# Patient Record
Sex: Female | Born: 1960 | Race: White | Hispanic: No | Smoking: Current every day smoker
Health system: Southern US, Community
[De-identification: ages and names within clinical notes are randomized; demographics above are authoritative.]

## PROBLEM LIST (undated history)

## (undated) DIAGNOSIS — Z87828 Personal history of other (healed) physical injury and trauma: Secondary | ICD-10-CM

## (undated) DIAGNOSIS — J45909 Unspecified asthma, uncomplicated: Secondary | ICD-10-CM

## (undated) DIAGNOSIS — F319 Bipolar disorder, unspecified: Secondary | ICD-10-CM

## (undated) DIAGNOSIS — F419 Anxiety disorder, unspecified: Secondary | ICD-10-CM

## (undated) DIAGNOSIS — F32A Depression, unspecified: Secondary | ICD-10-CM

## (undated) DIAGNOSIS — K219 Gastro-esophageal reflux disease without esophagitis: Secondary | ICD-10-CM

## (undated) DIAGNOSIS — G8929 Other chronic pain: Secondary | ICD-10-CM

## (undated) DIAGNOSIS — G5601 Carpal tunnel syndrome, right upper limb: Secondary | ICD-10-CM

## (undated) DIAGNOSIS — I10 Essential (primary) hypertension: Secondary | ICD-10-CM

## (undated) DIAGNOSIS — R569 Unspecified convulsions: Secondary | ICD-10-CM

## (undated) DIAGNOSIS — J40 Bronchitis, not specified as acute or chronic: Secondary | ICD-10-CM

## (undated) DIAGNOSIS — I509 Heart failure, unspecified: Secondary | ICD-10-CM

## (undated) DIAGNOSIS — R51 Headache: Secondary | ICD-10-CM

## (undated) DIAGNOSIS — Z972 Presence of dental prosthetic device (complete) (partial): Secondary | ICD-10-CM

## (undated) DIAGNOSIS — Z720 Tobacco use: Secondary | ICD-10-CM

## (undated) DIAGNOSIS — F329 Major depressive disorder, single episode, unspecified: Secondary | ICD-10-CM

## (undated) DIAGNOSIS — K08109 Complete loss of teeth, unspecified cause, unspecified class: Secondary | ICD-10-CM

## (undated) DIAGNOSIS — M549 Dorsalgia, unspecified: Secondary | ICD-10-CM

## (undated) HISTORY — PX: BUNIONECTOMY: SHX129

## (undated) HISTORY — PX: TUBAL LIGATION: SHX77

## (undated) HISTORY — PX: ABDOMINAL HYSTERECTOMY: SHX81

## (undated) HISTORY — PX: CERVICAL FUSION: SHX112

## (undated) HISTORY — PX: INGUINAL HERNIA REPAIR: SUR1180

## (undated) HISTORY — PX: APPENDECTOMY: SHX54

---

## 1998-09-28 ENCOUNTER — Encounter: Payer: Self-pay | Admitting: Family Medicine

## 1998-09-28 ENCOUNTER — Ambulatory Visit (HOSPITAL_COMMUNITY): Admission: RE | Admit: 1998-09-28 | Discharge: 1998-09-28 | Payer: Self-pay | Admitting: Family Medicine

## 2000-04-18 ENCOUNTER — Emergency Department (HOSPITAL_COMMUNITY): Admission: EM | Admit: 2000-04-18 | Discharge: 2000-04-18 | Payer: Self-pay | Admitting: Emergency Medicine

## 2001-01-06 ENCOUNTER — Emergency Department (HOSPITAL_COMMUNITY): Admission: EM | Admit: 2001-01-06 | Discharge: 2001-01-06 | Payer: Self-pay

## 2001-01-31 ENCOUNTER — Encounter: Payer: Self-pay | Admitting: Emergency Medicine

## 2001-01-31 ENCOUNTER — Emergency Department (HOSPITAL_COMMUNITY): Admission: EM | Admit: 2001-01-31 | Discharge: 2001-01-31 | Payer: Self-pay | Admitting: Emergency Medicine

## 2002-03-16 ENCOUNTER — Emergency Department (HOSPITAL_COMMUNITY): Admission: EM | Admit: 2002-03-16 | Discharge: 2002-03-16 | Payer: Self-pay | Admitting: Emergency Medicine

## 2002-03-17 ENCOUNTER — Emergency Department (HOSPITAL_COMMUNITY): Admission: EM | Admit: 2002-03-17 | Discharge: 2002-03-17 | Payer: Self-pay | Admitting: Emergency Medicine

## 2002-03-17 ENCOUNTER — Ambulatory Visit (HOSPITAL_COMMUNITY): Admission: RE | Admit: 2002-03-17 | Discharge: 2002-03-17 | Payer: Self-pay | Admitting: Emergency Medicine

## 2002-09-22 ENCOUNTER — Emergency Department (HOSPITAL_COMMUNITY): Admission: EM | Admit: 2002-09-22 | Discharge: 2002-09-22 | Payer: Self-pay | Admitting: Emergency Medicine

## 2002-11-13 ENCOUNTER — Emergency Department (HOSPITAL_COMMUNITY): Admission: EM | Admit: 2002-11-13 | Discharge: 2002-11-13 | Payer: Self-pay | Admitting: *Deleted

## 2003-01-22 ENCOUNTER — Ambulatory Visit (HOSPITAL_BASED_OUTPATIENT_CLINIC_OR_DEPARTMENT_OTHER): Admission: RE | Admit: 2003-01-22 | Discharge: 2003-01-22 | Payer: Self-pay | Admitting: General Surgery

## 2003-08-09 ENCOUNTER — Emergency Department (HOSPITAL_COMMUNITY): Admission: AD | Admit: 2003-08-09 | Discharge: 2003-08-09 | Payer: Self-pay | Admitting: Family Medicine

## 2003-09-20 ENCOUNTER — Ambulatory Visit (HOSPITAL_COMMUNITY): Admission: RE | Admit: 2003-09-20 | Discharge: 2003-09-20 | Payer: Self-pay

## 2003-11-17 ENCOUNTER — Ambulatory Visit (HOSPITAL_COMMUNITY): Admission: RE | Admit: 2003-11-17 | Discharge: 2003-11-17 | Payer: Self-pay | Admitting: Family Medicine

## 2004-10-18 ENCOUNTER — Ambulatory Visit: Payer: Self-pay | Admitting: Family Medicine

## 2004-10-18 ENCOUNTER — Ambulatory Visit (HOSPITAL_COMMUNITY): Admission: RE | Admit: 2004-10-18 | Discharge: 2004-10-18 | Payer: Self-pay | Admitting: Family Medicine

## 2004-10-30 ENCOUNTER — Ambulatory Visit: Payer: Self-pay | Admitting: Family Medicine

## 2004-11-08 ENCOUNTER — Ambulatory Visit (HOSPITAL_COMMUNITY): Admission: RE | Admit: 2004-11-08 | Discharge: 2004-11-08 | Payer: Self-pay | Admitting: Family Medicine

## 2004-11-15 ENCOUNTER — Ambulatory Visit: Payer: Self-pay | Admitting: Family Medicine

## 2004-11-20 ENCOUNTER — Ambulatory Visit (HOSPITAL_COMMUNITY): Admission: RE | Admit: 2004-11-20 | Discharge: 2004-11-20 | Payer: Self-pay | Admitting: Family Medicine

## 2004-12-25 ENCOUNTER — Ambulatory Visit: Payer: Self-pay | Admitting: Family Medicine

## 2005-01-04 ENCOUNTER — Ambulatory Visit: Payer: Self-pay | Admitting: Family Medicine

## 2005-01-07 ENCOUNTER — Ambulatory Visit (HOSPITAL_COMMUNITY): Admission: RE | Admit: 2005-01-07 | Discharge: 2005-01-07 | Payer: Self-pay | Admitting: Family Medicine

## 2005-02-20 ENCOUNTER — Ambulatory Visit: Payer: Self-pay | Admitting: Family Medicine

## 2005-03-02 ENCOUNTER — Ambulatory Visit (HOSPITAL_COMMUNITY): Admission: RE | Admit: 2005-03-02 | Discharge: 2005-03-02 | Payer: Self-pay | Admitting: Family Medicine

## 2005-03-26 ENCOUNTER — Ambulatory Visit: Payer: Self-pay | Admitting: Family Medicine

## 2005-05-02 ENCOUNTER — Encounter
Admission: RE | Admit: 2005-05-02 | Discharge: 2005-05-02 | Payer: Self-pay | Admitting: Physical Medicine and Rehabilitation

## 2005-06-04 ENCOUNTER — Ambulatory Visit: Payer: Self-pay | Admitting: Family Medicine

## 2005-06-18 ENCOUNTER — Ambulatory Visit: Payer: Self-pay | Admitting: Family Medicine

## 2005-06-20 ENCOUNTER — Ambulatory Visit: Payer: Self-pay | Admitting: Family Medicine

## 2005-07-09 ENCOUNTER — Ambulatory Visit (HOSPITAL_COMMUNITY): Admission: RE | Admit: 2005-07-09 | Discharge: 2005-07-09 | Payer: Self-pay | Admitting: Neurosurgery

## 2005-08-15 ENCOUNTER — Ambulatory Visit: Payer: Self-pay | Admitting: Family Medicine

## 2005-10-05 ENCOUNTER — Ambulatory Visit: Payer: Self-pay | Admitting: Family Medicine

## 2005-11-05 ENCOUNTER — Ambulatory Visit: Payer: Self-pay | Admitting: Internal Medicine

## 2005-11-06 ENCOUNTER — Ambulatory Visit: Payer: Self-pay | Admitting: *Deleted

## 2005-11-15 ENCOUNTER — Ambulatory Visit (HOSPITAL_COMMUNITY): Admission: RE | Admit: 2005-11-15 | Discharge: 2005-11-15 | Payer: Self-pay | Admitting: Family Medicine

## 2005-11-16 ENCOUNTER — Ambulatory Visit: Payer: Self-pay | Admitting: Family Medicine

## 2005-11-27 ENCOUNTER — Encounter: Admission: RE | Admit: 2005-11-27 | Discharge: 2005-11-27 | Payer: Self-pay | Admitting: Family Medicine

## 2006-01-14 ENCOUNTER — Ambulatory Visit: Payer: Self-pay | Admitting: Family Medicine

## 2006-03-04 ENCOUNTER — Ambulatory Visit: Payer: Self-pay | Admitting: Family Medicine

## 2006-04-08 ENCOUNTER — Encounter: Admission: RE | Admit: 2006-04-08 | Discharge: 2006-04-08 | Payer: Self-pay | Admitting: Orthopaedic Surgery

## 2006-04-16 ENCOUNTER — Ambulatory Visit: Payer: Self-pay | Admitting: Family Medicine

## 2006-04-29 ENCOUNTER — Encounter
Admission: RE | Admit: 2006-04-29 | Discharge: 2006-07-28 | Payer: Self-pay | Admitting: Physical Medicine & Rehabilitation

## 2006-04-29 ENCOUNTER — Ambulatory Visit: Payer: Self-pay | Admitting: Physical Medicine & Rehabilitation

## 2006-05-13 ENCOUNTER — Ambulatory Visit: Payer: Self-pay | Admitting: Internal Medicine

## 2006-05-15 ENCOUNTER — Ambulatory Visit: Payer: Self-pay | Admitting: Physical Medicine & Rehabilitation

## 2006-06-05 ENCOUNTER — Encounter
Admission: RE | Admit: 2006-06-05 | Discharge: 2006-09-03 | Payer: Self-pay | Admitting: Physical Medicine & Rehabilitation

## 2006-06-19 ENCOUNTER — Ambulatory Visit: Payer: Self-pay | Admitting: Family Medicine

## 2006-06-27 ENCOUNTER — Ambulatory Visit: Payer: Self-pay | Admitting: Physical Medicine & Rehabilitation

## 2006-07-26 ENCOUNTER — Encounter
Admission: RE | Admit: 2006-07-26 | Discharge: 2006-10-24 | Payer: Self-pay | Admitting: Physical Medicine & Rehabilitation

## 2006-07-30 ENCOUNTER — Ambulatory Visit: Payer: Self-pay | Admitting: Physical Medicine & Rehabilitation

## 2006-08-08 ENCOUNTER — Ambulatory Visit: Payer: Self-pay | Admitting: Physical Medicine & Rehabilitation

## 2006-08-30 ENCOUNTER — Emergency Department (HOSPITAL_COMMUNITY): Admission: EM | Admit: 2006-08-30 | Discharge: 2006-08-30 | Payer: Self-pay | Admitting: Family Medicine

## 2006-09-02 ENCOUNTER — Ambulatory Visit (HOSPITAL_COMMUNITY): Admission: RE | Admit: 2006-09-02 | Discharge: 2006-09-02 | Payer: Self-pay | Admitting: Family Medicine

## 2006-09-02 ENCOUNTER — Emergency Department (HOSPITAL_COMMUNITY): Admission: EM | Admit: 2006-09-02 | Discharge: 2006-09-02 | Payer: Self-pay | Admitting: Family Medicine

## 2006-09-12 ENCOUNTER — Encounter
Admission: RE | Admit: 2006-09-12 | Discharge: 2006-12-11 | Payer: Self-pay | Admitting: Physical Medicine & Rehabilitation

## 2006-09-16 ENCOUNTER — Ambulatory Visit: Payer: Self-pay | Admitting: Physical Medicine & Rehabilitation

## 2006-10-14 ENCOUNTER — Ambulatory Visit: Payer: Self-pay | Admitting: Physical Medicine & Rehabilitation

## 2006-10-28 ENCOUNTER — Ambulatory Visit (HOSPITAL_COMMUNITY): Admission: RE | Admit: 2006-10-28 | Discharge: 2006-10-28 | Payer: Self-pay | Admitting: Obstetrics and Gynecology

## 2006-11-08 ENCOUNTER — Encounter (INDEPENDENT_AMBULATORY_CARE_PROVIDER_SITE_OTHER): Payer: Self-pay | Admitting: Obstetrics and Gynecology

## 2006-11-08 ENCOUNTER — Encounter (HOSPITAL_COMMUNITY): Admission: RE | Admit: 2006-11-08 | Discharge: 2006-12-31 | Payer: Self-pay | Admitting: Obstetrics and Gynecology

## 2006-11-08 ENCOUNTER — Ambulatory Visit (HOSPITAL_BASED_OUTPATIENT_CLINIC_OR_DEPARTMENT_OTHER): Admission: RE | Admit: 2006-11-08 | Discharge: 2006-11-08 | Payer: Self-pay | Admitting: Obstetrics and Gynecology

## 2006-11-08 HISTORY — PX: DIAGNOSTIC LAPAROSCOPY: SUR761

## 2006-11-08 HISTORY — PX: LAPAROSCOPIC APPENDECTOMY: SUR753

## 2006-11-13 DIAGNOSIS — F319 Bipolar disorder, unspecified: Secondary | ICD-10-CM | POA: Insufficient documentation

## 2006-11-13 DIAGNOSIS — F1021 Alcohol dependence, in remission: Secondary | ICD-10-CM

## 2006-11-13 DIAGNOSIS — K219 Gastro-esophageal reflux disease without esophagitis: Secondary | ICD-10-CM

## 2006-11-13 DIAGNOSIS — F3181 Bipolar II disorder: Secondary | ICD-10-CM | POA: Insufficient documentation

## 2006-11-13 DIAGNOSIS — M549 Dorsalgia, unspecified: Secondary | ICD-10-CM | POA: Insufficient documentation

## 2006-11-13 DIAGNOSIS — F17200 Nicotine dependence, unspecified, uncomplicated: Secondary | ICD-10-CM | POA: Insufficient documentation

## 2006-12-09 ENCOUNTER — Ambulatory Visit (HOSPITAL_COMMUNITY): Admission: RE | Admit: 2006-12-09 | Discharge: 2006-12-09 | Payer: Self-pay | Admitting: Family Medicine

## 2006-12-15 ENCOUNTER — Encounter: Admission: RE | Admit: 2006-12-15 | Discharge: 2006-12-15 | Payer: Self-pay | Admitting: Orthopaedic Surgery

## 2007-01-06 ENCOUNTER — Encounter
Admission: RE | Admit: 2007-01-06 | Discharge: 2007-04-06 | Payer: Self-pay | Admitting: Physical Medicine & Rehabilitation

## 2007-01-06 ENCOUNTER — Ambulatory Visit: Payer: Self-pay | Admitting: Physical Medicine & Rehabilitation

## 2007-02-05 ENCOUNTER — Inpatient Hospital Stay (HOSPITAL_COMMUNITY): Admission: RE | Admit: 2007-02-05 | Discharge: 2007-02-07 | Payer: Self-pay | Admitting: Orthopaedic Surgery

## 2007-02-05 HISTORY — PX: LUMBAR LAMINECTOMY: SHX95

## 2007-03-03 ENCOUNTER — Ambulatory Visit: Payer: Self-pay | Admitting: Physical Medicine & Rehabilitation

## 2007-04-29 ENCOUNTER — Encounter
Admission: RE | Admit: 2007-04-29 | Discharge: 2007-05-07 | Payer: Self-pay | Admitting: Physical Medicine & Rehabilitation

## 2007-05-06 ENCOUNTER — Ambulatory Visit: Payer: Self-pay | Admitting: Physical Medicine & Rehabilitation

## 2007-06-09 ENCOUNTER — Encounter
Admission: RE | Admit: 2007-06-09 | Discharge: 2007-09-07 | Payer: Self-pay | Admitting: Physical Medicine & Rehabilitation

## 2007-06-09 ENCOUNTER — Ambulatory Visit: Payer: Self-pay | Admitting: Physical Medicine & Rehabilitation

## 2007-07-02 ENCOUNTER — Ambulatory Visit: Payer: Self-pay | Admitting: Physical Medicine & Rehabilitation

## 2007-08-28 ENCOUNTER — Encounter
Admission: RE | Admit: 2007-08-28 | Discharge: 2007-11-26 | Payer: Self-pay | Admitting: Physical Medicine & Rehabilitation

## 2007-09-01 ENCOUNTER — Ambulatory Visit: Payer: Self-pay | Admitting: Physical Medicine & Rehabilitation

## 2007-09-30 ENCOUNTER — Ambulatory Visit: Payer: Self-pay | Admitting: Physical Medicine & Rehabilitation

## 2007-10-29 ENCOUNTER — Ambulatory Visit: Payer: Self-pay | Admitting: Physical Medicine & Rehabilitation

## 2007-11-26 ENCOUNTER — Encounter
Admission: RE | Admit: 2007-11-26 | Discharge: 2008-01-28 | Payer: Self-pay | Admitting: Physical Medicine & Rehabilitation

## 2007-12-01 ENCOUNTER — Ambulatory Visit: Payer: Self-pay | Admitting: Physical Medicine & Rehabilitation

## 2007-12-12 ENCOUNTER — Ambulatory Visit (HOSPITAL_COMMUNITY): Admission: RE | Admit: 2007-12-12 | Discharge: 2007-12-12 | Payer: Self-pay | Admitting: Family Medicine

## 2007-12-31 ENCOUNTER — Ambulatory Visit: Payer: Self-pay | Admitting: Physical Medicine & Rehabilitation

## 2008-01-28 ENCOUNTER — Ambulatory Visit: Payer: Self-pay | Admitting: Physical Medicine & Rehabilitation

## 2008-02-07 ENCOUNTER — Encounter
Admission: RE | Admit: 2008-02-07 | Discharge: 2008-02-07 | Payer: Self-pay | Admitting: Physical Medicine & Rehabilitation

## 2008-02-10 ENCOUNTER — Encounter: Admission: RE | Admit: 2008-02-10 | Discharge: 2008-02-10 | Payer: Self-pay | Admitting: Orthopaedic Surgery

## 2008-02-27 ENCOUNTER — Encounter
Admission: RE | Admit: 2008-02-27 | Discharge: 2008-04-28 | Payer: Self-pay | Admitting: Physical Medicine & Rehabilitation

## 2008-03-01 ENCOUNTER — Ambulatory Visit: Payer: Self-pay | Admitting: Physical Medicine & Rehabilitation

## 2008-03-01 ENCOUNTER — Encounter: Admission: RE | Admit: 2008-03-01 | Discharge: 2008-05-30 | Payer: Self-pay | Admitting: Anesthesiology

## 2008-03-16 ENCOUNTER — Ambulatory Visit: Payer: Self-pay | Admitting: Anesthesiology

## 2008-03-30 ENCOUNTER — Ambulatory Visit: Payer: Self-pay | Admitting: Anesthesiology

## 2008-04-28 ENCOUNTER — Ambulatory Visit: Payer: Self-pay | Admitting: Physical Medicine & Rehabilitation

## 2008-05-18 ENCOUNTER — Encounter
Admission: RE | Admit: 2008-05-18 | Discharge: 2008-08-16 | Payer: Self-pay | Admitting: Physical Medicine & Rehabilitation

## 2008-05-18 ENCOUNTER — Ambulatory Visit: Payer: Self-pay | Admitting: Physical Medicine & Rehabilitation

## 2008-06-15 ENCOUNTER — Ambulatory Visit: Payer: Self-pay | Admitting: Physical Medicine & Rehabilitation

## 2008-07-27 ENCOUNTER — Ambulatory Visit: Payer: Self-pay | Admitting: Physical Medicine & Rehabilitation

## 2008-08-07 ENCOUNTER — Ambulatory Visit (HOSPITAL_BASED_OUTPATIENT_CLINIC_OR_DEPARTMENT_OTHER): Admission: RE | Admit: 2008-08-07 | Discharge: 2008-08-07 | Payer: Self-pay | Admitting: Otolaryngology

## 2008-08-07 ENCOUNTER — Ambulatory Visit: Payer: Self-pay | Admitting: Internal Medicine

## 2008-08-23 ENCOUNTER — Encounter
Admission: RE | Admit: 2008-08-23 | Discharge: 2008-11-21 | Payer: Self-pay | Admitting: Physical Medicine & Rehabilitation

## 2008-10-01 ENCOUNTER — Ambulatory Visit: Payer: Self-pay | Admitting: Physical Medicine & Rehabilitation

## 2008-11-03 ENCOUNTER — Ambulatory Visit: Payer: Self-pay | Admitting: Physical Medicine & Rehabilitation

## 2008-12-01 ENCOUNTER — Ambulatory Visit: Payer: Self-pay | Admitting: Physical Medicine & Rehabilitation

## 2008-12-01 ENCOUNTER — Encounter
Admission: RE | Admit: 2008-12-01 | Discharge: 2009-03-01 | Payer: Self-pay | Admitting: Physical Medicine & Rehabilitation

## 2008-12-13 ENCOUNTER — Ambulatory Visit (HOSPITAL_COMMUNITY): Admission: RE | Admit: 2008-12-13 | Discharge: 2008-12-13 | Payer: Self-pay | Admitting: Family Medicine

## 2008-12-24 ENCOUNTER — Encounter: Admission: RE | Admit: 2008-12-24 | Discharge: 2008-12-24 | Payer: Self-pay | Admitting: Orthopaedic Surgery

## 2008-12-30 ENCOUNTER — Ambulatory Visit: Payer: Self-pay | Admitting: Physical Medicine & Rehabilitation

## 2009-01-26 ENCOUNTER — Ambulatory Visit: Payer: Self-pay | Admitting: Physical Medicine & Rehabilitation

## 2009-02-23 ENCOUNTER — Ambulatory Visit: Payer: Self-pay | Admitting: Physical Medicine & Rehabilitation

## 2009-03-10 ENCOUNTER — Emergency Department (HOSPITAL_COMMUNITY): Admission: EM | Admit: 2009-03-10 | Discharge: 2009-03-10 | Payer: Self-pay | Admitting: Emergency Medicine

## 2009-03-14 ENCOUNTER — Encounter: Admission: RE | Admit: 2009-03-14 | Discharge: 2009-05-18 | Payer: Self-pay | Admitting: Anesthesiology

## 2009-03-21 ENCOUNTER — Encounter
Admission: RE | Admit: 2009-03-21 | Discharge: 2009-05-18 | Payer: Self-pay | Admitting: Physical Medicine & Rehabilitation

## 2009-03-22 ENCOUNTER — Ambulatory Visit: Payer: Self-pay | Admitting: Physical Medicine & Rehabilitation

## 2009-04-12 ENCOUNTER — Ambulatory Visit: Payer: Self-pay | Admitting: Anesthesiology

## 2009-08-26 ENCOUNTER — Encounter: Admission: RE | Admit: 2009-08-26 | Discharge: 2009-08-26 | Payer: Self-pay | Admitting: Orthopaedic Surgery

## 2010-02-06 ENCOUNTER — Encounter: Admission: RE | Admit: 2010-02-06 | Discharge: 2010-02-06 | Payer: Self-pay | Admitting: Orthopaedic Surgery

## 2010-06-18 ENCOUNTER — Encounter: Payer: Self-pay | Admitting: Orthopaedic Surgery

## 2010-06-19 ENCOUNTER — Encounter: Payer: Self-pay | Admitting: Physical Medicine & Rehabilitation

## 2010-08-31 LAB — ETHANOL

## 2010-08-31 LAB — URINALYSIS, ROUTINE W REFLEX MICROSCOPIC
Glucose, UA: NEGATIVE mg/dL
Ketones, ur: NEGATIVE mg/dL
Nitrite: NEGATIVE
pH: 5.5 (ref 5.0–8.0)

## 2010-08-31 LAB — DIFFERENTIAL
Basophils Absolute: 0.1 10*3/uL (ref 0.0–0.1)
Eosinophils Relative: 2 % (ref 0–5)
Lymphocytes Relative: 35 % (ref 12–46)
Lymphs Abs: 3.6 10*3/uL (ref 0.7–4.0)
Neutro Abs: 5.5 10*3/uL (ref 1.7–7.7)
Neutrophils Relative %: 54 % (ref 43–77)

## 2010-08-31 LAB — RAPID URINE DRUG SCREEN, HOSP PERFORMED
Amphetamines: NOT DETECTED
Benzodiazepines: POSITIVE — AB
Opiates: POSITIVE — AB

## 2010-08-31 LAB — BASIC METABOLIC PANEL
BUN: 18 mg/dL (ref 6–23)
Calcium: 9.5 mg/dL (ref 8.4–10.5)
Chloride: 107 mEq/L (ref 96–112)
Creatinine, Ser: 0.67 mg/dL (ref 0.4–1.2)
GFR calc non Af Amer: >60 mL/min (ref >60)

## 2010-08-31 LAB — CBC
Platelets: 185 10*3/uL (ref 150–400)
RDW: 13.5 % (ref 11.5–15.5)
WBC: 10.2 10*3/uL (ref 4.0–10.5)

## 2010-08-31 LAB — POCT CARDIAC MARKERS
CKMB, poc: <1 ng/mL — ABNORMAL LOW (ref 1.0–8.0)
Myoglobin, poc: 42 ng/mL (ref 12–200)

## 2010-10-10 NOTE — Assessment & Plan Note (Signed)
Darlene Wilcox is set for back surgery tomorrow due to her progressive disk  disease and stenosis.  She is having increasing pain. Vicodin and MS  Contin are no longer covering the pain.  Flector patches are helping  somewhat as well as Lidoderm.  The Flector seems to be most beneficial.  She is on Soma for spasm, although this is not helping either at this  point. The pain is 10/10 today. Pain is in the back and radiating into  the legs.  She is having some right subscapular pain as well as pain to  the neck.  She is set for surgery tomorrow at 1:30 by Dr. Noel Gerold.   REVIEW OF SYSTEMS:  Notable for the above.  The patient also reports  depression, anxiety, night sweats, wheeze, and constipation.  Full  review is in written Health and History section.   SOCIAL HISTORY:  The patient is single.  Her mother will be helping her  at discharge.  She is still smoking.   PHYSICAL EXAMINATION:  VITAL SIGNS:  Blood pressure 117/78, pulse 82,  respiratory rate 18.  She is saturating 99% on room air.  GENERAL:  The patient is pleasant, in a bit of distress and anxious  today.  Otherwise she is appropriate.  HEART:  Regular rate.  CHEST:  Clear.  BACK:  Very rigid to palpation and range of motion.  She has spasm  throughout the thoracic and lumbar spinal musculature.  She is limited  with flexion and extension today.  She is constantly moving as pain is  difficult to control.   ASSESSMENT:  1. Significant lumbar spondylosis and stenosis with radiculitis.  2. Secondary myofascial pain.  3. Anxiety and depression.  4. Bipolar disorder.  5. Carpal tunnel syndrome.   PLAN:  1. I will see the patient in the hospital for postoperative pain      management.  2. New medication as above for now.  3. I will have her back here in about a month for followup at the pain      center.      Darlene Wilcox, M.D.  Electronically Signed     ZTS/MedQ  D:  02/04/2007 11:18:07  T:  02/04/2007 17:22:17  Job  #:  [id number]   cc:   Darlene Wilcox, M.D.  Fax: [phone]

## 2010-10-10 NOTE — Procedures (Signed)
Darlene Wilcox NO.:  [id number]   MEDICAL RECORD NO.:  [id number]          PATIENT TYPE:  REC   LOCATION:  [facility]                          FACILITY:  [facility]   PHYSICIAN:  Celene Kras, MD        DATE OF BIRTH:  22-Feb-1961   DATE OF PROCEDURE:  03/16/2008  DATE OF DISCHARGE:                               OPERATIVE REPORT   Darlene Wilcox comes in for pain management today.  I evaluated her and  reviewed the Health and History form and 14-point review of systems.  She is seen Dr. Noel Gerold and Dr. Riley Kill, recommending nerve root block, C6,  left side.  1. She is having some evolving bilateral spondylitic pain is some      point we may also address the facet as RF could be an option.  2. Diagnostically and therapeutically we reviewed the risk,      complications, and options of transforaminal cervical epidural      intervention.  She understands and wishes to proceed.  Needle was      blunted.  3. Follow her up expectantly.  Modified with features in health      profile such as cigarette cessation is mandatory for best outcome.   Reviewed with her, questions were answered discussed in lay terms.   Objectively, diffuse paracervical myofascial, primary positive cervical  facetal compression test, particularly left greater than right.  Slightly decreased grip strength to the left, pain with extension and  range of motion.  Nothing new neurologically.   IMPRESSION:  Radicular symptoms just above the cervical spine.  I saw  radicular elements present.   PLAN:  Cervical transforaminal epidural intervention, C6, withdrawn  needle technique, left side.  She is consented.  Predicated further  intervention based on need and overall response.  Posterior in 2-3  weeks.   The patient was taken to the fluoroscopy suite and placed supine in  position.  Neck prepped and draped in the usual fashion.  Using a 25  gauge needle I advanced the C6 nerve root and confirmed placement  of  multiple fluoroscopic positions using Isovue 200.  Stabilize.  Then,  test uneventfully and follow with 1.5 mL of lidocaine and 1% MPF.  Tolerated the procedure well.  Assessment in the context of activities  of daily living.  We will see her in followup.  Discharge instructions  given.           ______________________________  Celene Kras, MD     HH/MEDQ  D:  03/16/2008 10:49:39  T:  03/16/2008 23:21:38  Job:  [id number]

## 2010-10-10 NOTE — Assessment & Plan Note (Signed)
Darlene Wilcox is back regarding her multiple pain issues.  Her neck is doing a  bit better after injections.  She had more success with the left C6  injection as opposed to the C7-T1 injection performed by Dr. Stevphen Rochester.  She complains of low back pain, especially this morning upon awakening,  which seems to be near the operative level.  She has minimal radiation  of leg pain.  She has ongoing hand symptoms and she wears braces for  these as it is related to carpal tunnel.  She complains of off and on  burning and tingling in the neck.  Pain interferes with general  activity, in relationship with others, and enjoyment of life on a mild  level.  Mood overall appears improved.  She is moving and exercising  minimally at home at this point.  She has gained some weight overall.   REVIEW OF SYSTEMS:  Notable for numbness, tremor, tingling, confusion,  depression, anxiety, night sweats, poor appetite, although she is  gaining weight somehow.  Full review is in the written health and  history section.   SOCIAL HISTORY:  The patient is single, lives with boyfriend.  She still  smokes.   PHYSICAL EXAMINATION:  VITAL SIGNS:  Blood pressure is 135/88, pulse is  95, respiratory rate 18, and she is sating 98% on room air.  GENERAL:  The patient is much more pleasant and appropriate today.  EXTREMITIES:  She is walking and moving around the room.  She has some  pain with forward flexion, but seems to have better range of motion  overall with about 50-60 degrees of forward flexion, 10-15 degrees of  extension, and rotation 15-20 degrees bilaterally.  Neck is somewhat  tender over the C5-C7 levels, but she seems to have better movement with  rotation and lateral bending today.  Spurling tests are negative to  equivocal.  Strength is 5/5.  She has positive Tinel signs at both  wrists.  Lumbar paraspinals are notable for pain, particularly at the L4-  L5 and L5-S1 areas bilaterally with associated spasms.  HEART:  Regular.  CHEST:  Clear.  ABDOMEN:  Soft and nontender.   ASSESSMENT:  1. Lumbar spondylosis with stenosis and radiculitis status post      laminectomy/fusion.  2. Cervical spondylosis with radiculopathy and positive response to      cervical epidural injections.  3. Anxiety with depression.  4. Bipolar disorder.  5. Carpal tunnel syndrome.   PLAN:  1. After informed consent, we injected 4 trigger points at the L4-L5      and L5-S1 areas bilaterally with 2 mL of 1% lidocaine.  The patient      tolerated it well.  2. I want to get her involved in outpatient physical therapy to      address range of motion stretching, cord muscle strengthening      modalities, and home exercise program.  She needs to be dedicated      to this as well as appropriate diet and weight loss.  3. Maintain MS Contin 60 mg q.12 h. as well as Norco 10/325 for      breakthrough pain, q.6 h. p.r.n., #60 and 120 respectively.  4. Continue imipramine, Cymbalta, and Soma as previous.  5. We will see her back in 1 month with nursing and 2 months with me.      Ranelle Oyster, M.D.  Electronically Signed     ZTS/MedQ  D:  04/28/2008 11:34:22  T:  04/29/2008 00:08:35  Job #:  [id number]   cc:   Sharolyn Douglas, M.D.  Fax: [phone]   Priscille Heidelberg. Pamalee Leyden, MD

## 2010-10-10 NOTE — Assessment & Plan Note (Signed)
Darlene Wilcox is back regarding her back pain. She is doing fairly well after  her back surgery last month. Her pain in the back is down to an average  of about a 5/10. These are worse in the morning and at night time. She  did have a fall the first couple of days after the hospital on her left  knee and the left knee is still painful. She had swelling initially but  it seems to be generally improving. She is applying a little bit of ice  to it but doing little else. She has had home therapy and is walking  with a walker. She wears her brace when she is out of bed. She describes  her pain as sharp, constant, aching. Sleep is fair. The pain interferes  with her quality of life and general activities on a moderate level.   REVIEW OF SYSTEMS:  The patient reports some depression, anxiety,  intermittent confusion, weight gain, night sweats, poor appetite and  abdominal pain at times. Other pertinent positives listed below. Full  review is in the written health and history section in the chart.   SOCIAL HISTORY:  The patient is single but living with boyfriend. She is  smoking still but less than a pack a day.   PHYSICAL EXAMINATION:  VITAL SIGNS:  Blood pressure is 121/79, pulse is  91, respiratory rate 18, she is satting 98% on room air.  GENERAL:  The patient is pleasant, minimal distress today. Affect was  bright.  HEART:  Regular rate.  CHEST:  Clear.  ABDOMEN:  Soft, nontender.  She is wearing her lumbar sacral orthosis today. We did not remove this  for examination today.  Her left knee had some mild peripatellar swelling and tenderness in the  prepatellar area but she had full range of motion. No crepitus was  noted. She was slightly antalgic with weightbearing on the left side.  Strength was generally 5/5 in both legs and in addition at the left  knee. Sensory exam is intact. Reflexes were 1+ to 2+ throughout.   ASSESSMENT:  1. Lumbar spondylosis and stenosis with radiculitis.  2.  Myofascial pain in the lumbar and thoracic spine.  3. Anxiety/depression.  4. Bipolar disorder.  5. Carpal tunnel syndrome.   PLAN:  1. The patient's improving nicely after surgery. Continue bracing and      range of motion per Dr. Wells Guiles instruction.  2. Recommended ice and Neoprene knee sleeve for the left knee. I also      gave her 2 weeks of Celebrex to help reduce any inflammation that      is there. I recommended ice 3-4 times a day to the left knee. This      should improve on its own.  3. Refilled morphine today as well as her Soma and trazodone. Dr.      Noel Gerold gave her hydrocodone. Will see her back in about 3 months      with me and 1 month in the nurse clinic.      Ranelle Oyster, M.D.  Electronically Signed     ZTS/MedQ  D:  03/04/2007 10:52:22  T:  03/04/2007 13:19:00  Job #:  [id number]   cc:   Sharolyn Douglas, M.D.  Fax: [phone]

## 2010-10-10 NOTE — Assessment & Plan Note (Signed)
Darlene Wilcox Wilcox is back regarding her multiple pain complaints.  She had an MRI  of her cervical spine done on December 24, 2008, which revealed a shallow  broad-based right paracentral medial foraminal disk protrusion with mass  effect on the thecal sac.  Mild foraminal encroachment was also seen.  These changes were mildly increased from the last imaging.  The patient  continues with bilateral neck pain around the operative site.  She  received some trigger point injections last month from Dr. Thomasena Edis, PA,  with minimal to poor results.  She tells me Dr. Thomasena Wilcox wants to treat  this conservatively for now.  She remains on hydrocodone, Topamax, and  Opana through our office.  She tries to exercise and work on straight at  the range of motion at home, but limited sometimes to pain.  She rates  her pain at 3-7/10 depending on when she is taking her meds, how much  activity she has had, etc.  Pain interferes with general activity,  relations with others, enjoyment of life from a mild-to-moderate level.  Sleep is fair.   REVIEW OF SYSTEMS:  Notable for multiple items including some spells  when she passed out.  She was worked up for these and it was thought  that she may have had some seizures.  The patient states that she has  history of seizure as a child.  Full review is in the written health and  history section of the chart.   SOCIAL HISTORY:  Unchanged.  The patient is single, living alone.   PHYSICAL EXAMINATION:  VITAL SIGNS:  Blood pressure is 112/67, pulse is  106, respiratory rate 18, sating 99% on room air.  The patient is  pleasant, alert, and oriented x3.  Affect is bright and appropriate.  She is wearing her back brace today.  Strength is preserved in the upper  and lower extremities, 5/5.  She has some mild pain with palpation over  the cervical, paraspinals, trapezius muscles, etc.  I was unable to  localize this today and muscles were generally tender overall.  Cognitively, she is  appropriate.  Her mood looks good today.  HEART:  Regular.  CHEST:  Clear.  ABDOMEN:  Soft, nontender.   ASSESSMENT:  1. Cervical post laminectomy syndrome with spondylosis and myofascial      pain also.  2. Lumbar spondylosis and post laminectomy syndrome.  3. Anxiety with depression.  4. Myofascial pain.   PLAN:  1. Some of her pain maybe related to the C6-C7 disk findings.  It      might be worthwhile trying C6-C7 epidural injection to see if shows      any improvement in her pain.  2. Meanwhile we refilled Opana ER 30 mg every 12 hours #60,      hydrocodone one every 6 hours p.r.n. #120.  3. Increase Topamax to 200 mg nightly.  4. I will see her back, pending injections above with Dr. Stevphen Wilcox.      Darlene Wilcox Wilcox, M.D.  Electronically Signed     ZTS/MedQ  D:  01/26/2009 13:57:35  T:  01/27/2009 04:26:54  Job #:  [id number]   cc:   Darlene Wilcox Wilcox, M.D.  Fax: [phone]   Darlene Wilcox Wilcox. Darlene Leyden, MD

## 2010-10-10 NOTE — Assessment & Plan Note (Signed)
Darlene Wilcox is back regarding her back pain.  She seems to have taken a step  back since I last saw her in April.  She comes in with a lot more back  and neck pain.  She rates it as 8-9/10.  She sounds as if she has been  more reclusive, not getting out much.  She is unable to tolerate much  driving, because she is not driving herself anywhere anymore for the  most part.  She denies any specific leg pain currently.  She does state  that the pain is sharp, burning, stabbing, and aching in the low back,  as well as in the periscapular regions.   MEDICATIONS:  1. MS Contin 15 mg q.12 h.  2. Norco 10 for breakthrough pain.  3. Trazodone 100 mg 2 tablets at bedtime.  4. Imipramine 150 mg at bedtime.  5. Celebrex 200 mg daily.  6. Soma 350 mg q.8 h. p.r.n. pain.  7. Lidoderm patches as well daily.   REVIEW OF SYSTEMS:  Notable for numbness, confusion, depression, spasm,  and dizziness.  She reports no vomiting, diarrhea, bleeding, weight  gain, and night sweats.  Other pertinent positives are above and full  review is in written health and history section.   SOCIAL HISTORY:  The patient is single, living with her boyfriend.  She  is still smoking half a pack of cigarettes per day.   PHYSICAL EXAMINATION:  Blood pressure is 129/70, pulse is 86,  respiratory rate 24, and she is sating 98% on room air.  The patient is  pleasant, alert, and oriented x3.  Affect is bright and appropriate.  She did get a bit tearful at times when talking about her problems.  Posture was poor, particularly in the thoracic and cervical spine with  the head forward posture assumed.  She was tender to palpation over the  lower lumbar spine and PSIS areas.  She had pain with flexion,  extension, rotation, and lateral bending in each side.  She was point  tender to left rhomboid and left trapezius areas essentially at the T4  and C5 levels.  Spinous processes were tender to touch.  Neurologically,  she had no focal or  sensory loss. Strength is symmetrical near 5/5.  Reflexes were 1+ to 2+ throughout.   ASSESSMENT:  1. Lumbar spondylosis with stenosis and radiculitis.  2. Myofascial, cervical, and thoracic pain.  3. Anxiety/depression.  4. Bipolar disorder.  5. Carpal tunnel syndrome.   PLAN:  1. After full consent, we injected 2 trigger points along the left      rhomboid and left trapezius near the spinous process.  The patient      tolerated it well.  2. We will increase MS Contin to 30 mg q.12 h.  3. Increase Cymbalta to 120 mg daily.  4. Continue Norco for breakthrough pain.  5. Recommend ongoing range of motion stretching and exercising.  6. Follow up with Dr. Noel Wilcox for surgical opinion in the next month or      so.  7. I will see her back in about 2 months with nurse clinic followup in      1 month.      Darlene Wilcox, M.D.  Electronically Signed     ZTS/MedQ  D:  12/01/2007 13:37:12  T:  12/02/2007 10:27:25  Job #:  [id number]   cc:   Darlene Wilcox, M.D.  Fax: [phone]   Darlene Wilcox. Darlene Leyden, MD

## 2010-10-10 NOTE — Assessment & Plan Note (Signed)
Darlene Wilcox is back regarding her cervical and low back pain.  She had her  cervical decompression in March.  She feels that the pain has decreased  a bit, although still has some tightness particularly in her neck  restricting range of motion.  She describes the pain currently as sharp,  burning, tingling, and aching.  She does complain of numbness in her  feet.  Low back pain is generally stable to improved.  Sleep is poor at  times.   REVIEW OF SYSTEMS:  Notable for numbness, tingling, depression,  confusion, anxiety, weakness, occasional night sweats, problems with  constipation and appetite.  Other pertinent positives are above and full  review is in the written health and history section of the chart.   SOCIAL HISTORY:  Unchanged.  She lives with boyfriend still.   PHYSICAL EXAMINATION:  Blood pressure is 120/69, pulse is 94,  respiratory rate 18, she is sating 98% on room air.  The patient is  pleasant, alert, and oriented x3.  She is much calmer today.  She has  her bone stimulator on in her neck.  We palpated the neck and there are  multiple myofascial trigger points today including the right and left  trapezius, right splenius capitis, and right levator scapulae muscles.  Strength seemed to be generally preserved in both upper extremities.  Reflexes are 2+.  Did not push range of motion today.  Lower extremity  strength is 5/5 with fair range of motion limited somewhat in flexion  and extension today.  Heart is regular.  Chest is clear.  Abdomen is  soft, nontender.  She appears to have lost a bit of weight since I last  saw her.   ASSESSMENT:  1. Lumbar spondylosis with stenosis and radiculitis status post      laminectomy and fusion.  2. Cervical spondylosis status post laminectomy and fusion.  3. Anxiety with depression.  4. Myofascial pain.   PLAN:  1. Continue Topamax 100 mg nightly.  2. Refilled Opana ER 30 mg 1 q.12 h. #60 and hydrocodone 1 q.6 h.      p.r.n. #20.  3. After informed consent, injected 4 muscles (right and left traps,      splenius capitis on the right and right levator scapulae) each with      2 mL of 1% lidocaine.  The patient tolerated well without any ill      effects.  Encouraged range of motion per Dr. Wells Guiles direction.  4. I would like to see the patient work on better exercise and      conditioning as a whole.  She agrees with this and we will make a      better effort there.  5. We will see her back in about 3 months' time with 37-month followup      in nursing clinic.  Overall, she appears a bit improved today.      Ranelle Oyster, M.D.  Electronically Signed     ZTS/MedQ  D:  11/03/2008 13:56:35  T:  11/04/2008 04:25:51  Job #:  [id number]   cc:   Sharolyn Douglas, M.D.  Fax: [phone]   Priscille Heidelberg. Pamalee Leyden, MD

## 2010-10-10 NOTE — Op Note (Signed)
Darlene Wilcox, ADSIT NO.:  [id number]   MEDICAL RECORD NO.:  [id number]          PATIENT TYPE:  AMB   LOCATION:  [facility]                         FACILITY:  [facility]   PHYSICIAN:  Malachi Pro. Ambrose Mantle, M.D. DATE OF BIRTH:  01/21/1961   DATE OF PROCEDURE:  11/08/2006  DATE OF DISCHARGE:                               OPERATIVE REPORT   PREOPERATIVE DIAGNOSES:  1. Chronic right lower quadrant pain, negative CT scan.  2. Normal pelvic exam.   POSTOPERATIVE DIAGNOSES:  1. Chronic right lower quadrant pain, negative CT scan.  2. Normal pelvic exam.  3. Slight adhesions of the left ovary to the sigmoid colon.  4. Normal right tube and ovary.  5. Adhesions surrounding the appendix to the right pelvic wall.   OPERATION:  Diagnostic laparoscopy.   CONSULTATION:  Velora Heckler, MD, who performed an appendectomy and  lysis of adhesions.   ANESTHESIA:  General anesthesia.   DESCRIPTION OF PROCEDURE:  The patient was brought to the operating room  and placed under satisfactory general anesthesia and placed in Allen  stirrups.  The abdomen was prepped with Betadine solution.  The urethra  was prepped and a 14 Foley catheter was inserted to straight drain; the  patient's urethra would not admit a 16 catheter.  A ring forceps with a  sponge was placed into the vagina.  The abdomen was then draped as a  sterile field.  A semilunar incision was made in the inferior portion of  the umbilicus and carried down to the fascia.  The fascia was elevated,  incised and the peritoneum was identified and entered.  A pursestring  suture of 0 Vicryl was placed around the fascial opening.  An Hasson  cannula was placed into the abdominal cavity and then the suture was  attached to the Hasson cannula.  Visualization was good.  I used high  flow to establish pneumoperitoneum and then made an incision into the  lower abdomen suprapubically and inserted a 5-mm port under direct  vision.  Inspection  of the upper abdomen revealed the liver to be  smooth.  I did not see any abnormalities.  Inspection of the pelvis  revealed the cul-de-sac posteriorly to be normal; the anterior cul-de-  sac was normal.  The right tube and ovary were normal.  The right ovary  was slightly adherent to the right pelvic wall, as would be expected  after surgery.  The left tube and ovary also appeared normal; however,  the left ovary was adherent slightly to the sigmoid colon.  Exploring  the right lower quadrant, the appendix was adherent to the right pelvic  wall; it did not appear to be inflamed.  The cecum did not seem to be  adherent, except for what appeared to be normal attachments.  I called  Dr. Derrell Lolling; he was not available.  Dr. Gerrit Friends came to see if the  adhesions around the appendix should be taken down.  He scrubbed in and  took the adhesions around the appendix down and then did an appendectomy  that he will dictate himself.  He established hemostasis, liberally  irrigated the pelvis with Nezhat catheter and hemostasis was complete.  He then left the operating room.  I inspected both accessory ports;  there was no bleeding.  I released the pneumoperitoneum, tied the  pursestring suture through the fascia down; it gave a good closure of  the fascia.  I then closed the subcu tissue  with 3-0 Vicryl and the skin was reapproximated with 3-0 plain catgut.  I placed 1/8-inch Steri-Strips across the excess report skin incisions,  removed the Hulka cannula and the Foley will be discontinued.  The  patient was then returned to Recovery in satisfactory condition.      Malachi Pro. Ambrose Mantle, M.D.  Electronically Signed     TFH/MEDQ  D:  11/08/2006  T:  11/09/2006  Job:  [id number]

## 2010-10-10 NOTE — Op Note (Signed)
NAMEHILDAGARD, SOBECKI NO.:  [id number]   MEDICAL RECORD NO.:  [id number]          PATIENT TYPE:  INP   LOCATION:  5009                         FACILITY:  [facility]   PHYSICIAN:  Sharolyn Douglas, M.D.        DATE OF BIRTH:  04-Aug-1960   DATE OF PROCEDURE:  02/05/2007  DATE OF DISCHARGE:                               OPERATIVE REPORT   DIAGNOSIS:  1. L4-5 lumbar degenerative disk disease with severe back and lower      extremity pain.  2. L4-5 disk herniation with left-sided stenosis.   PROCEDURE:  1. L4-5 laminectomy with decompression of the thecal sac.  2. L4-5 transforaminal lumbar interbody fusion with placement of 8-mm      PEEK cage.  3. L4-5 pedicle screw instrumentation using Abbott Spine system.  4. Posterior spinal fusion L4-5.  5. Local autogenous bone graft supplemented with OP1 BMP.   SURGEON:  Sharolyn Douglas, MD.   ASSISTANT:  Aura Fey. Bobbe Medico.   ANESTHESIA:  General endotracheal.   ESTIMATED BLOOD LOSS:  200 mL.   COMPLICATIONS:  None.   NEEDLE AND SPONGE COUNT:  Correct.   INDICATIONS:  The patient is a pleasant 45-year female with chronic  progressive back and bilateral lower extremity pain left greater than  right.  Her imaging studies show a severe degenerative disk at L4-5.  There is relative preservation of the remaining disk.  There is a broad-  based disk herniation more so on the left side which appears to be  impinging on the L5 nerve root.  She has failed all attempts at other  conservative treatment modalities, now elects to undergo L4-5  decompression and fusion in hopes of improving her symptoms.  Risk,  benefits, alternatives were reviewed, the patient elected to proceed.   PROCEDURE:  After informed consent she was taken to the operating room.  Underwent general endotracheal anesthesia without difficulty, given  prophylactic IV antibiotics.  Neuro monitoring was established in form  of lower extremity EMGs and SSEPs.  She was  turned prone onto the [place]  frame.  All bony prominences padded.  Face, eyes protected at all times.  Back prepped, draped usual sterile fashion.  Midline incision made over  the L4-5 level.  The exposure was carried out to the tips of the  transverse processes of L4 and L5 bilaterally.  Intraoperative x-ray was  taken confirming the levels.  We then placed pedicle screws at L4-L5  bilaterally using anatomic probing technique.  Each pedicle point was  started using the awl.  The pedicles were probed and then palpated with  ball feeler.  There were no breeches.  We placed 6.5 x 45 mm screws in  L4, 6.5 x 35 mm screw on the left and L5 and 6.5 x 40 mm screw on the  right at L5.  The bone quality was excellent.  The screw purchase was  good.  We stimulated each screw using triggered EMGs there were no  deleterious changes.   At this point we turned our attention to completing a laminectomy by  removing the inferior 2/3 of the L4 spinous process and lamina.  Using  loupes and headlight magnification, the ligamentum flavum was removed  piecemeal.  We then performed a sublaminar decompression.  The lateral  recesses were decompressed and the L5 nerve roots were freed up.   We then turned our attention to performing transforaminal lumbar  interbody fusion on the left side at L4-5.  The remaining L4-5 facet  joint was osteotomized.  Free running EMGs were monitored.  The exiting  transversing nerve roots were identified and protected all times.  A  transforaminal window was created.  Epidural bleeders were coagulated  with bipolar.  The disk space was entered and a radical diskectomy was  completed.  The disk material was degenerative.  Cartilaginous endplates  were scraped using the curved curettes.  Disk space was dilated up to 8  mm.  We then packed the disk space with local bone graft obtained from  the laminectomy.  We inserted an 8 mm PEEK cage into the interspace  tamped anteriorly and  then flipped it longitudinally.  There were no  changes in the free running EMGs throughout either procedure.   At this point we completed the posterior spinal fusion by decorticating  transverse processes of L4 and L5 bilaterally, packed the remaining  local bone graft into the gutters over L4 and L5.  We then placed short  segment titanium rods into the polyaxial screw heads.  Compression was  applied before shearing off the locking caps.  Hemostasis was achieved.  Intraoperative x-ray was taken which showed good positioning of  instrumentation at L4-5.  Deep Hemovac drain left, deep fascia closed  with a running #1 Vicryl suture.  Subcutaneous layer closed with 0  Vicryl and 2-0 Vicryl followed by a running 3-0 subcu Vicryl suture on  the skin edges.  Dermabond was applied.  Sterile dressing was placed.  Patient turned supine, extubated without difficulty and transferred to  recovery in stable condition.   It should be noted my assistant Orlin Hilding, PA was present throughout  procedure.  She assisted me with positioning.  She then assisted with  the exposure using the Cobb elevators and suction.  She then assisted  with the decompression using loupes and headlight magnification.  She  assisted with the instrumentation and fusion and then with wound  closure.      Sharolyn Douglas, M.D.  Electronically Signed     MC/MEDQ  D:  02/05/2007  T:  02/06/2007  Job:  [id number]

## 2010-10-10 NOTE — Procedures (Signed)
Darlene Wilcox, GRAZIOSI NO.:  [id number]   MEDICAL RECORD NO.:  [id number]         PATIENT TYPE:  OUT   LOCATION:  SLEEP CENTER                 FACILITY:  [facility]   PHYSICIAN:  Clinton D. Maple Hudson, MD, FCCP, FACPDATE OF BIRTH:  September 04, 1960   DATE OF STUDY:  08/07/2008                            NOCTURNAL POLYSOMNOGRAM   REFERRING PHYSICIAN:  Hermelinda Medicus, M.D.   INDICATION FOR STUDY:  Hypersomnia with sleep apnea.   EPWORTH SLEEPINESS SCORE:  Epworth sleepiness score 10/24, BMI 32.5.  Weight 172 pounds, height 61 inches.  Neck 14 inches.   MEDICATIONS:  Home medications are charted and reviewed.   SLEEP ARCHITECTURE:  Total sleep time of 366 minutes with sleep  efficiency 99.3%.  Stage I was 1%, stage II 99%, stages III and REM were  absent.  Sleep latency 0.5 minutes, awake after sleep onset 2 minutes,  arousal index 2.5.  Bedtime medication included carisoprodol 350 mg,  morphine 60 mg, Klonopin 1 mg, topiramate, imipramine 50 mg,  oxcarbazepine 600 mg, trazodone 200 mg.  Sleep architecture was  remarkable for sustained sleep without normal waking intervals,  predominantly stage II.  This sleep pattern reflects her significant  bedtime medication.   RESPIRATORY DATA:  Apnea-hypopnea index (AHI) 0.7 per hour.  Total of  four events was scored, all as hypopneas.  She slept only supine and all  events were recorded in that position.   OXYGEN DATA:  Moderate snoring with oxygen desaturation to a nadir of  81%.  A total of 44.2 minutes was recorded with oxygen saturation less  than 88%.  By protocol, the technician added supplemental nasal prong  oxygen at 1 L per minute at 23:11 hours to maintain oxygen saturation  closer to 90% through the rest of the study.   CARDIAC DATA:  Sinus rhythm 92-100 beats per minute.   MOVEMENT-PARASOMNIA:  No significant movement disturbance.  No bathroom  visits.  The patient slept supine with very little adventitious   movement.   IMPRESSIONS-RECOMMENDATIONS:  1. This patient has a history of bipolar disorder and sleep      architecture reflects significant bedtime medication as listed      above.  The technician commented that upon arrival the patient had      delayed reactions and responses and needed assistance with      paperwork.  The patient also described an irregular sleep pattern      at home sometimes staying up all night and then sleeping all day      suggesting poor sleep hygiene.  2. Insignificant sleep disordered breathing, within normal limits.      Apnea-hypopnea index 0.7 per hour (normal range 2-5 per hour).  All      sleep was supine and events were hypopneas.  Moderate snoring with      oxygen desaturation to a nadir of 81%.  3. The patient is a smoker.  She recorded 44.2 minutes with oxygen      saturation on room air less than 88%      indicating that she would qualify for supplemental oxygen during      sleep  at home.  The technician added 1 L per minute nasal prong      oxygen to sustain oxygen saturation close to 90% through the study.      Clinton D. Maple Hudson, MD, [facility], FACP  Diplomate, Biomedical engineer of Sleep Medicine  Electronically Signed     CDY/MEDQ  D:  08/08/2008 09:40:57  T:  08/09/2008 02:00:56  Job:  [id number]

## 2010-10-10 NOTE — Procedures (Signed)
[name], HOESCHEN NO.:  [id number]   MEDICAL RECORD NO.:  [id number]         PATIENT TYPE:  APRM   LOCATION:                                 FACILITY:   PHYSICIAN:  Celene Kras, MD        DATE OF BIRTH:  [birth year]   DATE OF PROCEDURE:  DATE OF DISCHARGE:                               OPERATIVE REPORT   [facility] with good  results from her previous transforaminal intervention.  Essentially less  arm pain, more of bilateral neck and arm pain, but the distribution, and  relief cycling obtained from the transforaminal intervention is still  evident.  I think diagnostically and therapeutically she is very  pleased.   Modifiable features in health profile such as cigarette cessation is  strongly urged for best outcome.   I will go ahead and I will block her by translaminar approach as this is  in the right and left side, then follow her expectantly.  The risk or  benefit leads [place] this way, and I have discussed that with her.   Objectively, diffuse paracervical myofascial discomfort, positive  cervical facetal compression test.  The radicular pain is improved after  the transforaminal approach, less C6 distribution, spondylitic pain is  evident today.  This is more so arm, shoulder pain, bilateral, nothing  new neurologically with good grip strength and adequate range of motion.   IMPRESSION:  Degenerative spine disease of the cervical spine,  spondylitic pain.  Radicular elements of C6, improved.   PLAN:  Cervical epidural and follow up expectantly.  Risks,  complications, and options are fully outlined.  There were no other  rationale performed, intervention minimize escalation controlled  substances, she did not bring her medications in, and we counseled.  She  is consented for today's procedure.   The patient was taken to fluoroscopy suite and placed in the prone  position.  The back prepped and draped in  the usual fashion.  The neck  was prepped and draped in the usual fashion using a Hustead needle.  I  advanced under direct fluoroscopic observation in the C7-T1 interspace  without any evidence of CSF, heme, or paresthesia.  Test block  uneventfully followed by 40 mg Aristocort and flushed the needle.   She tolerated the procedure well.  No complications from our procedure.  Appropriate recovery.  Discharge instructions given.           ______________________________  Celene Kras, MD     HH/MEDQ  D:  03/30/2008 14:43:20  T:  03/31/2008 03:24:31  Job:  [id number]

## 2010-10-10 NOTE — Assessment & Plan Note (Signed)
Darlene Wilcox is back regarding her back pain.  She is still having some pain  in her low back area.  In general, it is doing much better since  surgery, although she complains of pain into her legs.  Her shoulders  and neck have been a source of pain as well.  Her left knee has been  stable, although uncomfortable still.  She has had a few falls at home,  which really have been in some extenuating circumstances such as  tripping in a hole and falling while she was raking leaves outside.  Essentially, they were falls after doing things she should not have been  doing.  She is not having issues as far as her basic movement and  walking around the house and even surfaces.  She rates her pain a 4/10,  but it may spike to an 8/10.  She describes it as sharp, sometimes  tingling and aching.  Her leg pain is usually worse at night.  The pain  interferes with her general activity, relations with others, enjoyment  of life on a mild to moderate level.   REVIEW OF SYSTEMS:  positive  for the above as well as confusion,  depression, anxiety, some tremor, tingling, weight gain.  She reports  occasional wheezing as well.  Full review has been written under the  history section of the chart.   SOCIAL HISTORY:  Without significant change.  She is living with her  boyfriend and working on smoking cessation.   PHYSICAL EXAMINATION:  VITAL SIGNS:  Blood pressure 112/47, pulse 98,  respiratory rate 18.  She is saturating 97% on room air.  GENERAL:  The patient is pleasant, alert and oriented x3.  She stands  with head forward, shoulder curved posture.  She has pain throughout her  cervical and shoulder musculature.  She has most prominent pain in the  right middle trapezius muscles.  The back is still tender with palpation  over the lumbar paraspinal's in the perioperative site.  Strength is  generally preserved at 4+ to 5/5.  Sensory exam is intact.  The reflexes  are 1+ and 2+ throughout.  HEART:  Regular  rate.  CHEST:  Clear.  ABDOMEN:  Soft, nontender.   ASSESSMENT:  1. Lumbar spondylosis with stenosis and radiculitis.  2. Myofascial lumbar and cervical pain.  3. Anxiety/depression.  4. Bipolar disorder.  5. Carpal tunnel syndrome.   PLAN:  1. Continue surgical followup per Dr. Noel Gerold.  2. Will send patient for outpatient PT to set her up with a cervical      traction unit for the neck.  3. After informed consent, we injected the bilateral trapezius      muscles, each with two mL 1% lidocaine.  The patient tolerated it      well.  The patient needs to work on sitting and standing posture.  4. Refill morphine as well as Norco today, baseline pain control.  5. I wanted her to go back on the Tofranil at nighttime 75 mg to help      with her leg pain.  6. Continue trazodone for sleep.  7. I will see her back in 3 months with nurse clinic followup in 1      month's time.      Ranelle Oyster, M.D.  Electronically Signed     ZTS/MedQ  D:  06/10/2007 12:05:07  T:  06/10/2007 12:45:29  Job #:  [id number]   cc:   Sharolyn Douglas, M.D.  Fax: [phone]   Rayne Du  [facility]

## 2010-10-10 NOTE — Assessment & Plan Note (Signed)
Darlene Wilcox is back regarding her cervical and lumbar spine pain.  We are  setting her up for a left C6 nerve root block with Dr. Stevphen Rochester tomorrow.  She had some relief with the adjustment of her MS Contin.  She is seeing  Dr. Noel Gerold, who concurs with the plan for her neck.  She also had an  lumbar spine MRI performed, which showed no dramatic changes, although  she has ongoing arthritis and degenerative findings there.  She rates  her pain as 6-8/10, described it as aching and burning.  The pain  involves the back and radiates down both arms, left more than right.  Pain interferes with general activity, in relationship with others, and  enjoyment of life on a moderate-to-severe level.   REVIEW OF SYSTEMS:  The patient reports numbness, spasms, confusion,  depressions, anxiety, fever, weight gain, night sweats, poor appetite,  and sleep apnea.  Full review is in the written health and history  section of the chart.   SOCIAL HISTORY:  The patient is single and lives with her boyfriend.  She is still smoking.   PHYSICAL EXAMINATION:  VITAL SIGNS:  Blood pressure is 136/76, pulse is  86, respiratory rate 18, and she is sating 97% on room air.  GENERAL:  The patient's mood was much better today.  She has still been  anxious.  NEUROLOGIC:  Strength is generally 5/5 in both upper extremities with 2+  reflexes throughout.  No focal sensory findings today.  She did seem to  favor the left side a bit more and had pain at the C5-C6 interspace.  The pain is worse with flexion today than extension.  She had equivocal  Spurling test bilaterally.  Leg strength is 5/5 and normal sensory exam  today.  The patient has ongoing tenderness to palpation of the  myofascial lumbar spine as well as the cervical and shoulder girdle  muscles.  HEART:  Regular.  CHEST:  Clear.  ABDOMEN:  Soft and nontender.  Weight was about the same.   ASSESSMENT:  1. Lumbar spondylosis with stenosis and radiculitis.  2.  Cervical spondylosis with documented C5-C6 disk protrusion and      displacement of the C6 nerve root on the left.  3. Anxiety with depression.  4. Bipolar disorder.  5. Carpal tunnel syndrome.   PLAN:  1. The patient to see Dr. Stevphen Rochester tomorrow for the left C6 nerve root      block.  2. Continue MS Contin 60 q.12 h. with Norco for breakthrough pain.  3. Maintain imipramine and Cymbalta for mood and neuropathic pain      symptoms.  4. Follow up with Dr. Noel Gerold per his office.  5. Consider therapy course once injections are complete and if pain      decreases.  The patient needs desperately to improve her activity      levels and tolerance.      Ranelle Oyster, M.D.  Electronically Signed     ZTS/MedQ  D:  03/01/2008 13:08:45  T:  03/02/2008 00:26:33  Job #:  [id number]   cc:   Sharolyn Douglas, M.D.  Fax: [phone]   Priscille Heidelberg. Pamalee Leyden, MD

## 2010-10-10 NOTE — Assessment & Plan Note (Signed)
Darlene Wilcox is back regarding her back pain.  She has had a progression of  her neck symptoms over the last few days where she has felt like  cracking, popping, and sudden increase in pain in her neck.  She has had  persistent pain in her neck for several weeks with pain and radiating  into the arms.  She has not seen Dr. Noel Gerold back in followup.  Her low  back continues to bother her as well.  She states that she has pain from  her neck down to her low back essentially.  She rates her pain at 10/10  today.  On average it has been 6-7/10.  Her mood has been poor.  She  states that she does not want to go out of the house.  We increased her  Cymbalta back in July to 120 mg daily and she does not notice any  specific change with that.  She remains on MS Contin 30 mg q.12 h.  She  uses Norco for breakthrough pain.  Trazodone 100 mg 2 tablets at night.  She denied Tofranil XL due to insurance issues.  She remains on  Celebrex.   REVIEW OF SYSTEMS:  Notable for the above.  She continues to have some  confusion, numbness, depression, anxiety, and dizziness.  She has had  some migraine headaches as well with vomiting and diarrhea.   SOCIAL HISTORY:  The patient lives alone.  She is still smoking.   PHYSICAL EXAMINATION:  VITAL SIGNS:  Blood pressure 121/57, pulse is 69,  respiratory rate 18, and she is sating 98% on room air.  GENERAL:  Her affect is very tearful and she is anxious today.  It is  hard to redirect her at times due to her mood.  MUSCULOSKELETAL:  Posture is poor.  She sits with the head in forward  position.  Neck was painful with movements in all planes today.  She had  palpation tenderness over the suboccipital musculature and upper  trapezius regions.  She had 2+ reflexes in both upper limbs today.  Strength is 5/5 except for pain inhibition.  Sensory exam is nonfocal.  She has had diffuse myofascial tenderness along the periscapular and  trapezius regions.  HEART:  Regular.  CHEST:  Clear.  ABDOMEN:  Soft and nontender.  She appears to have gained some weight.   ASSESSMENT:  1. Lumbar spondylosis with stenosis, radiculitis.  2. Cervical myofascial pain with acute increase in pain.  The patient      complains of intermittent radicular symptoms.  3. Anxiety/depression.  4. Bipolar disorder.  5. Carpal tunnel syndrome.   PLAN:  1. We will set the patient up for MRI of the cervical spine to assess      her disks and look for entrapment.  2. We injected the patient with 60 mg of IM Toradol to decrease pain      acutely today.  3. Increase MS Contin to 60 mg q.12 h. with Norco for breakthrough      pain q.6 h. p.r.n.  4. Maintain Cymbalta 120 mg daily.  We may look at another      antidepressant.  5. Resume imipramine, but try short-acting form 50 mg 1 or 2 nightly.  6. Consider psychiatric referral or at least psychological referral.  7. I will review her case with Dr. Noel Gerold.      Ranelle Oyster, M.D.  Electronically Signed     ZTS/MedQ  D:  01/28/2008 12:59:55  T:  01/29/2008 47:82:95  Job #:  [id number]   cc:   Sharolyn Douglas, M.D.  Fax: [phone]   Priscille Heidelberg. Pamalee Leyden, MD

## 2010-10-10 NOTE — Assessment & Plan Note (Signed)
Darlene Wilcox is back regarding her cervical and low back pain.  The trigger  points we did in her low back were very beneficial at the beginning of  the month.  Unfortunately, she was raking and moving some hay in her  yard and aggravated her low back again.  The pain now is 6/10, sometimes  will die down to 2/10 depending on activity level.  Her Oswestry pain  score is 58% today.  She sees Dr. Noel Gerold regarding her neck, which is an  ongoing problem.  She had some benefits with the injections particularly  the left C6 injection.  The pain is worse in the morning and night  hours.  It seems to worsen also with standing, bending, and prolonged  sitting.   REVIEW OF SYSTEMS:  Without specific change.  Mood has been a bit better  overall.  She has occasional anxiety, problems with appetite.  Other  pertinent positives are above.   SOCIAL HISTORY:  Without change.  She lives with her boyfriend.  Still  smoking.   PHYSICAL EXAMINATION:  VITAL SIGNS:  Blood pressure 117/60, pulse is 86,  respiratory rate 18, she is sating 95% on room air.  GENERAL:  The patient is pleasant, alert, and oriented x3.  Affect is  bright.  She is happy today for the most part.  MUSCULOSKELETAL:  She had pain in the neck with flexion and extension  today.  Spurling test was equivocal on either side.  Strength was 5/5 in  both upper extremities with intact sensation and 1+ reflexes throughout.  She has a hard time relaxing for those.  Low back pain was noted with  palpation in the L4-L5, L5-S1 levels along the facets of spinal  processes and paraspinal muscles.  Pain seemed to be worse when she  flexed forward.  She is only able to bend approximately 50 degrees today  before pain set in.  Extension caused some discomfort as well as  rotation and lateral bending on either side.  Strength and sensation was  intact in both legs today.  Weight appeared to be stable.  Cognitively,  she is intact.  HEART:  Regular.  CHEST:   Clear.  ABDOMEN:  Soft and nontender.   ASSESSMENT:  1. Lumbar spondylosis with stenosis and radiculitis.  2. Status post laminectomy and fusion.  3. Cervical spondylosis with disk protrusions at C5-C6 and C6-C7.  She      responded partially to cervical blocks.  4. Anxiety with depression.  5. Bipolar disorder.  6. Carpal tunnel syndrome.  7. Myofascial pain of the neck and low back.   PLAN:  1. I think it would still be worth trying another cervical epidural      injection particularly at the left C6 location.  I will await Dr.      Wells Guiles followup today regarding surgery.  It also may be      worthwhile to try a traction at home.  2. After informed consent, we injected 4 trigger points on the lower      lumbar spine, 3 on the right, 1 on the left, utilizing 2 mL of 1%      lidocaine.  The patient tolerated it well.  Post-injection      instructions were given.  3. Continue morphine, controlled release 60 mg q.12 h. with Norco 10      for breakthrough pain.  I wrote for 60 and      20 of these respectively.  4.  I will see her back in approximately 1 month's time.      Ranelle Oyster, M.D.  Electronically Signed     ZTS/MedQ  D:  05/18/2008 13:30:57  T:  05/19/2008 05:04:57  Job #:  [id number]   cc:   Sharolyn Douglas, M.D.  Fax: [phone]

## 2010-10-10 NOTE — Assessment & Plan Note (Signed)
[name] returns today. She was scheduled for left sided lumbar  radiofrequency and had a right lumbar ROF done 09/16/2006. She saw Dr.  Riley Kill on 04/23 with muscle spasms in her back which improved after  medication adjustments. She is now approximately one month post and does  not note any significant improvement in right sided lumbar pain relief.   She has had no new problems in the interval time other than above.   Her pain is rated currently at a 4 out of 10 and it is not only  involving the low back but also mid back and shoulders.   EXAM:  GENERAL: No acute distress. Mood and affect are appropriate.  VITAL SIGNS: Blood pressure 121/61, pulse 77, respirations 18, O2 sat  97% on room air. Gait is normal.   IMPRESSION:  Lumbar pain.   While she has had a good response with medial branch blocks, right sided  radiofrequency does not seem to have been beneficial for her. As I  discussed with the patient, this may be either due to her still being  sore from the procedure which can happen for about six weeks after the  procedure and potentially she may experience further right sided pain  relief over the upcoming several weeks. If that is the case then there  would be indication to do left sided lumbar RF.   I would be happy to see her again; otherwise she will follow up with Dr.  Riley Kill. She has an appointment later on this week for medication  management.   I will see her on a p.r.n. basis.      Erick Colace, M.D.  Electronically Signed     AEK/MedQ  D:  10/14/2006 10:07:32  T:  10/14/2006 10:39:52  Job #:  [id number]   cc:   Ranelle Oyster, M.D.  Fax: [phone]

## 2010-10-10 NOTE — Op Note (Signed)
NAMEBRENISHA, [name] NO.:  [id number]   MEDICAL RECORD NO.:  [id number]          PATIENT TYPE:  AMB   LOCATION:  [facility]                         FACILITY:  [facility]   PHYSICIAN:  Velora Heckler, MD      DATE OF BIRTH:  August 22, 1960   DATE OF PROCEDURE:  11/08/2006  DATE OF DISCHARGE:                               OPERATIVE REPORT   PREOPERATIVE DIAGNOSIS:  Right lower quadrant abdominal pain.   POSTOPERATIVE DIAGNOSIS:  Right lower quadrant abdominal pain.   PROCEDURE:  Laparoscopic appendectomy.   SURGEON:  Velora Heckler, MD, FACS   ANESTHESIA:  General.   ESTIMATED BLOOD LOSS:  Minimal.   PREPARATION:  Betadine.   COMPLICATIONS:  None.   SURGICAL ASSISTANT:  Tracey Harries, MD   INDICATIONS:  The patient is a 50 year old female brought to the  operating room by Dr. Ambrose Mantle for diagnostic laparoscopy for right lower  quadrant and pelvic pain.  The patient had had a previous workup  including evaluation by Dr. Claud Kelp.  CT scan of abdomen and  pelvis was performed prior to the procedure.  Dr. Ambrose Mantle brought her to  Outpatient Surgery for diagnostic laparoscopy.  Intraoperatively, he  found adhesions to the appendix; General Surgery was called to the  operating room and appendectomy was requested.   BODY OF REPORT:  Procedure is being done in OR #3 at the [facility].  I came to the operating room and Dr. Ambrose Mantle  demonstrated adhesions to the appendix and the cecum, which he felt  might be responsible for the patient's discomfort.  He asked that I lyse  these adhesions.  After scrubbing and gowning in the usual aseptic  fashion, I examined the pelvis and the entire abdomen with the scope.  I  placed another operative port in the left lower quadrant.  The appendix  was mobilized.  Using the Endoshears with cautery, I was able to  mobilize the entire appendix up to the level of the cecum; I also  mobilized the cecum laterally.  The  appendix appeared grossly normal.  However, after all of this mobilization, Dr. Ambrose Mantle felt that proceeding  with appendectomy would be the right thing to do.  Therefore, I employed  an Endo-GIA stapler with vascular cartridges to transect the base of the  appendix at its junction with the cecal wall.  The appendiceal mesentery  was then taken down with 2 separate loads of the Endo-GIA with vascular  cartridges.  The appendix was extracted through the umbilical port  without difficulty and submitted to Pathology.  The right lower quadrant  was irrigated with warm saline.  Hemostasis was obtained along the  staple lines with judicious use of the electrocautery.  The right lower  quadrant and pelvis were  irrigated with saline, which was evacuated.  Good hemostasis was noted  throughout.  No other abnormality was identified.  At this point, the  procedure was turned back over to Dr. Ambrose Mantle, who will dictate the  remainder of the procedure under a separate operative report.  The  patient tolerated this portion of the procedure well.      Velora Heckler, MD  Electronically Signed     TMG/MEDQ  D:  11/08/2006  T:  11/09/2006  Job:  [id number]   cc:   Malachi Pro. Ambrose Mantle, M.D.  Fax: [phone]

## 2010-10-10 NOTE — Assessment & Plan Note (Signed)
Darlene Wilcox is back regarding her back pain.  Her low back is doing fairly  well except when she is more active.  She had a fall the other day while  carrying two gallons of water for her daughter over to her house.  She  has had some pain between the scapulas.  She received a trigger point  injection by Dr. Noel Gerold which helped for a few days.  The pain seems to  be more prominent when she twists and uses  the shoulders and arms.  Does recall using an ab lounger quite frequently and wonders if this  would be irritating the upper back.  She rates her pain on the average  of 5 to 7 out of 10.  The pain is sharp, stabbing, constant, tingling  and aching.   REVIEW OF SYSTEMS:  Notable for the above shows numbness, tremor,  tingling, spasms, anxiety, depression.  Today, she was started on  valproic acid for mood control.  She reports diarrhea, constipation,  nausea, abdominal pain, night sweats.  Other pertinent positives are  in  the written health history section.   SOCIAL HISTORY:  Not significant change.  She is still smoking a pack  per day of cigarettes or less.   PHYSICAL EXAMINATION:  Blood pressure is 123/82, pulse 92, respiratory  rate is 12.  She is sating 99% on room air.  The patient is pleasant, alert and oriented times three.  She continues  to sit with the head forward, shoulders curved posture with pain  throughout the cervical and mid thoracic back.  She has palpation  tenderness over the perioperative site.  Strength generally 4+ to 5 out  of 5.  Sensory exam is intact.  Reflexes are 1+ and 2+.  HEART:  Regular.  CHEST:  Clear.  She had a few trigger points along the medial right  scapula as well as the upper scapula on the left side in the medial  angle.  Cognitively, she was appropriate.  Cranial nerve exam is intact.  She  has gained some weight.   ASSESSMENT:  1. Lumbar spondylosis with stenosis and radiculitis.  2. Myofascial cervical and thoracic pain.  3.  Anxiety/depression.  4. Bipolar disorder.  5. Carpal tunnel syndrome.   PLAN:  1. After informed consent, we injected three trigger points today      along the right and left rhomboids totaling 6 cc of 1% lidocaine.      The patient tolerated it well.  We discussed multiple stretches and      range of motion exercises to improve her posture and range of      motion there.  2. Refill controlled-release morphine and Norco for breakthrough pain.  3. Other medications for mood per primary physician.  4. I will see her back in about two to three months with nurse clinic      follow up in one month's time.      Ranelle Oyster, M.D.  Electronically Signed     ZTS/MedQ  D:  09/01/2007 16:35:22  T:  09/01/2007 19:05:04  Job #:  [id number]   cc:   Sharolyn Douglas, M.D.  Fax: [phone]   Priscille Heidelberg. Pamalee Leyden, MD

## 2010-10-10 NOTE — Assessment & Plan Note (Signed)
Darlene Wilcox is back regarding her low back and cervical pain.  She saw Dr.  Wynn Banker two weeks ago for follow up left sided RF, but she felt that  the right sided procedure she had done previously was not of significant  benefit.  She did have good results with the medial branch blocks  initially.  She tells me that with her current medications including her  p.r.n. Vicodin as well as Flector and Lidoderm patches and muscle  relaxants that she is doing pretty well at this point.  She is having  some stress in her life from a psychosocial standpoint and this has been  affecting her pain a bit more.  She has had some increasing neck pain  particularly.   The patient rates her pain a 4 out of 10 today and describes it as  sharp, stabbing, constant and aching.  Pain interferes with general  activity, relationships with others and enjoyment of life on a mild  level but it is variable.  The pain increases with general activity  including walking, bending and prolonged sitting.   REVIEW OF SYSTEMS:  The patient reports the above plus occasional  depression, anxiety, shortness of breath, wheezing, weight gain, night  sweats, etc.  Full review is in the written Health and History section.   SOCIAL HISTORY:  The patient still smoking half a pack of cigarettes per  day.  She recently lost a family member over the last week.   PHYSICAL EXAMINATION:  Blood pressure is 122/71, pulse is 79,  respiratory rate 18.  She is sating 99% on room air.  The patient is  alert and oriented x 3.  Affect is bright and appropriate.  Gait is  slightly wide based and rigid.  She had decreased back range of motion  with 50-60 degrees of forward flexion.  She had pain with extension  maneuvers and facet testing today.  Strength is generally 5/5 in all  four extremities.  She was a little bit painful with neck movements in  all planes with no specific movement increasing her symptoms.  Cognitively she is intact.  Heart  is regular rate and rhythm.  Lungs are  clear.  Abdomen is soft and nontender.   ASSESSMENT:  1. Chronic lumbar and cervical spondylosis.  2. Secondary myofascial pain.  3. Anxiety/depression.  4. Bipolar disorder.  5. History of carpal tunnel syndrome.   PLAN:  1. We will send the patient for outpatient PT to work on range of      motion, posture and core muscle strengthening.  I think if she can      improve on her exercise regimen and improve her flexibility and      exercise tolerance she will be much better off on multiple levels.  2. Continue Vicodin for break-through pain, #90.  3. She will maintain on the Flector patches as well as Lidoderm      patches and Soma.  4. Continue MS Contin 15 mg q.12h.  5. I will see the patient back in three months' time.  She will follow      up with the Nurse Clinic in one month.      Ranelle Oyster, M.D.  Electronically Signed     ZTS/MedQ  D:  10/25/2006 12:49:00  T:  10/25/2006 13:22:12  Job #:  [id number]

## 2010-10-10 NOTE — Assessment & Plan Note (Signed)
The patient is back regarding her cervical low back pain.  She is having  a lot of pain into her shoulders and arms now particularly on the right  side.  She is scheduled for surgery on August 10, 2008, by Dr. Noel Gerold.  Her pain is 3-6/10.  She missed her last appointment with me.  Pain  interferes with general activity, relations with others, and enjoyment  of life on a mild level.   REVIEW OF SYSTEMS:  She notes numbness, tremor, tingling, confusion,  depression, anxiety, night sweats, problems with appetite, and weight  gain.  Full review is in the written health and history section.   SOCIAL HISTORY:  The patient is single, and lives with her boyfriend who  helps her at home.   PHYSICAL EXAMINATION:  VITAL SIGNS:  Blood pressure is 127/71, pulse is  110, respiratory rate 18.  She is sating 97% on room air.  GENERAL:  The patient is in the soft collar in the room today.  We did  not test the flexion and extension at the neck.  NEUROLOGIC:  Strength remained 5/5 in all 4 extremities with 2+ to 3+  intact reflexes.  Sensory exam was grossly intact.  Posture was fair.  She has been antalgic with either leg and gait.  HEART:  Regular.  CHEST:  Clear.  ABDOMEN:  Soft, nontender.  She does appear to gain further weight.   ASSESSMENT:  1. Lumbar spondylosis with stenosis and radiculitis status post      laminectomy/fusion.  2. Cervical spondylosis with radiculopathy, scheduled for surgery.  3. Anxiety with depression/bipolar disorder.   PLAN:  1. We will start a trial of Topamax 50 mg at bedtime x1 week then 100      mg thereafter.  I told that this was a bit of aggressive titration,      but we might see how she does with this prior to surgery.  2. Refilled MS Contin 60 mg q.12 h.  May need to look at another long-      acting opiate such as Opana.  3. Refill Norco 10/325 one q.6 h. p.r.n. #120.  4. I will continue with imipramine, Cymbalta, and Soma as previously      dosed.  5. I  will see her back in about 1-2 months' time.      Ranelle Oyster, M.D.  Electronically Signed     ZTS/MedQ  D:  07/27/2008 12:21:29  T:  07/28/2008 01:16:41  Job #:  [id number]   cc:   Sharolyn Douglas, M.D.  Fax: [phone]   Priscille Heidelberg. Pamalee Leyden, MD

## 2011-03-02 ENCOUNTER — Emergency Department (HOSPITAL_COMMUNITY)
Admission: EM | Admit: 2011-03-02 | Discharge: 2011-03-02 | Disposition: A | Discharge status: Home / Self Care | Payer: No Typology Code available for payment source | Attending: Emergency Medicine | Admitting: Emergency Medicine

## 2011-03-02 ENCOUNTER — Emergency Department (HOSPITAL_COMMUNITY): Payer: No Typology Code available for payment source

## 2011-03-02 DIAGNOSIS — F319 Bipolar disorder, unspecified: Secondary | ICD-10-CM | POA: Insufficient documentation

## 2011-03-02 DIAGNOSIS — G40909 Epilepsy, unspecified, not intractable, without status epilepticus: Secondary | ICD-10-CM | POA: Insufficient documentation

## 2011-03-02 DIAGNOSIS — M545 Low back pain, unspecified: Secondary | ICD-10-CM | POA: Insufficient documentation

## 2011-03-02 DIAGNOSIS — Z79899 Other long term (current) drug therapy: Secondary | ICD-10-CM | POA: Insufficient documentation

## 2011-03-02 DIAGNOSIS — M542 Cervicalgia: Secondary | ICD-10-CM | POA: Insufficient documentation

## 2011-03-02 DIAGNOSIS — M25519 Pain in unspecified shoulder: Secondary | ICD-10-CM | POA: Insufficient documentation

## 2011-03-09 LAB — URINE CULTURE: Colony Count: 15000

## 2011-03-09 LAB — ABO/RH: ABO/RH(D): O POS

## 2011-03-09 LAB — CBC
HCT: 44.4
MCV: 87.5
Platelets: 272
RDW: 13.1

## 2011-03-09 LAB — COMPREHENSIVE METABOLIC PANEL
ALT: 17
AST: 19
CO2: 29
Chloride: 105
GFR calc Af Amer: >60
GFR calc non Af Amer: >60
Sodium: 140
Total Bilirubin: 0.3

## 2011-03-09 LAB — TYPE AND SCREEN
ABO/RH(D): O POS
Antibody Screen: NEGATIVE

## 2011-03-09 LAB — BASIC METABOLIC PANEL
BUN: 3 — ABNORMAL LOW
BUN: 4 — ABNORMAL LOW
Calcium: 8.3 — ABNORMAL LOW
Calcium: 8.4
Creatinine, Ser: 0.4
Creatinine, Ser: 0.55
GFR calc non Af Amer: >60
GFR calc non Af Amer: >60
Glucose, Bld: 170 — ABNORMAL HIGH
Potassium: 3.8
Sodium: 139

## 2011-03-09 LAB — HEMOGLOBIN AND HEMATOCRIT, BLOOD: Hemoglobin: 11.7 — ABNORMAL LOW

## 2011-03-09 LAB — URINALYSIS, ROUTINE W REFLEX MICROSCOPIC
Glucose, UA: NEGATIVE
Hgb urine dipstick: NEGATIVE
Protein, ur: NEGATIVE

## 2011-03-09 LAB — DIFFERENTIAL
Basophils Absolute: 0.1
Eosinophils Absolute: 0.2
Eosinophils Relative: 2
Lymphs Abs: 3.5 — ABNORMAL HIGH

## 2011-03-09 LAB — PROTIME-INR: Prothrombin Time: 13.4

## 2011-03-15 LAB — POCT HEMOGLOBIN-HEMACUE: Operator id: 268271

## 2011-03-15 LAB — CBC
HCT: 41.2
MCHC: 33
MCV: 90.7
Platelets: 203
RDW: 13.4

## 2011-03-15 LAB — DIFFERENTIAL
Eosinophils Relative: 1
Lymphocytes Relative: 17
Lymphs Abs: 2.2
Monocytes Absolute: 0.8 — ABNORMAL HIGH
Neutro Abs: 9.7 — ABNORMAL HIGH

## 2011-03-15 LAB — COMPREHENSIVE METABOLIC PANEL
AST: 32
Albumin: 3.5
BUN: 7
Calcium: 8.8
Creatinine, Ser: 0.57
GFR calc Af Amer: >60
GFR calc non Af Amer: >60

## 2011-03-15 LAB — CARDIAC PANEL(CRET KIN+CKTOT+MB+TROPI)
Relative Index: INVALID
Relative Index: INVALID
Total CK: 77
Troponin I: 0.02

## 2011-10-01 ENCOUNTER — Other Ambulatory Visit: Payer: Self-pay | Admitting: Orthopaedic Surgery

## 2011-10-01 DIAGNOSIS — M545 Low back pain: Secondary | ICD-10-CM

## 2011-10-05 ENCOUNTER — Ambulatory Visit
Admission: RE | Admit: 2011-10-05 | Discharge: 2011-10-05 | Disposition: A | Discharge status: Home / Self Care | Payer: Medicare Other | Source: Ambulatory Visit | Attending: Orthopaedic Surgery | Admitting: Orthopaedic Surgery

## 2011-10-05 DIAGNOSIS — M545 Low back pain: Secondary | ICD-10-CM

## 2012-06-06 ENCOUNTER — Other Ambulatory Visit: Payer: Self-pay

## 2012-06-06 ENCOUNTER — Emergency Department (HOSPITAL_COMMUNITY): Payer: Medicare Other

## 2012-06-06 ENCOUNTER — Encounter (HOSPITAL_COMMUNITY): Payer: Self-pay | Admitting: Family Medicine

## 2012-06-06 ENCOUNTER — Emergency Department (HOSPITAL_COMMUNITY)
Admission: EM | Admit: 2012-06-06 | Discharge: 2012-06-07 | Disposition: A | Discharge status: Home / Self Care | Payer: Medicare Other | Attending: Emergency Medicine | Admitting: Emergency Medicine

## 2012-06-06 DIAGNOSIS — J45909 Unspecified asthma, uncomplicated: Secondary | ICD-10-CM | POA: Insufficient documentation

## 2012-06-06 DIAGNOSIS — R109 Unspecified abdominal pain: Secondary | ICD-10-CM | POA: Insufficient documentation

## 2012-06-06 DIAGNOSIS — K529 Noninfective gastroenteritis and colitis, unspecified: Secondary | ICD-10-CM

## 2012-06-06 DIAGNOSIS — F411 Generalized anxiety disorder: Secondary | ICD-10-CM | POA: Insufficient documentation

## 2012-06-06 DIAGNOSIS — F17200 Nicotine dependence, unspecified, uncomplicated: Secondary | ICD-10-CM | POA: Insufficient documentation

## 2012-06-06 DIAGNOSIS — F319 Bipolar disorder, unspecified: Secondary | ICD-10-CM | POA: Insufficient documentation

## 2012-06-06 DIAGNOSIS — K5289 Other specified noninfective gastroenteritis and colitis: Secondary | ICD-10-CM | POA: Insufficient documentation

## 2012-06-06 DIAGNOSIS — Z79899 Other long term (current) drug therapy: Secondary | ICD-10-CM | POA: Insufficient documentation

## 2012-06-06 HISTORY — DX: Unspecified asthma, uncomplicated: J45.909

## 2012-06-06 HISTORY — DX: Anxiety disorder, unspecified: F41.9

## 2012-06-06 LAB — CBC WITH DIFFERENTIAL/PLATELET
Eosinophils Absolute: 0.1 10*3/uL (ref 0.0–0.7)
Eosinophils Relative: 0 % (ref 0–5)
HCT: 42 % (ref 36.0–46.0)
Lymphocytes Relative: 14 % (ref 12–46)
Lymphs Abs: 2.4 10*3/uL (ref 0.7–4.0)
MCH: 29.4 pg (ref 26.0–34.0)
MCV: 84.5 fL (ref 78.0–100.0)
Monocytes Absolute: 1.2 10*3/uL — ABNORMAL HIGH (ref 0.1–1.0)
Monocytes Relative: 7 % (ref 3–12)
Platelets: 198 10*3/uL (ref 150–400)
RBC: 4.97 MIL/uL (ref 3.87–5.11)
WBC: 16.4 10*3/uL — ABNORMAL HIGH (ref 4.0–10.5)

## 2012-06-06 LAB — COMPREHENSIVE METABOLIC PANEL
ALT: 30 U/L (ref 0–35)
BUN: 8 mg/dL (ref 6–23)
CO2: 24 mEq/L (ref 19–32)
Calcium: 9.4 mg/dL (ref 8.4–10.5)
Creatinine, Ser: 0.56 mg/dL (ref 0.50–1.10)
GFR calc Af Amer: >90 mL/min (ref >90)
GFR calc non Af Amer: >90 mL/min (ref >90)
Glucose, Bld: 103 mg/dL — ABNORMAL HIGH (ref 70–99)
Sodium: 139 mEq/L (ref 135–145)
Total Protein: 7.1 g/dL (ref 6.0–8.3)

## 2012-06-06 LAB — POCT I-STAT TROPONIN I: Troponin i, poc: 0 ng/mL (ref 0.00–0.08)

## 2012-06-06 LAB — LIPASE, BLOOD: Lipase: 50 U/L (ref 11–59)

## 2012-06-06 MED ORDER — ONDANSETRON HCL 4 MG PO TABS: 4.0000 mg | ORAL_TABLET | Freq: Four times a day (QID) | ORAL | Status: DC

## 2012-06-06 MED ORDER — PANTOPRAZOLE SODIUM 40 MG IV SOLR
40.0000 mg | Freq: Once | INTRAVENOUS | Status: AC
  Administered 2012-06-06: 40 mg via INTRAVENOUS
  Filled 2012-06-06: qty 40

## 2012-06-06 MED ORDER — ONDANSETRON HCL 4 MG/2ML IJ SOLN
INTRAMUSCULAR | Status: AC
  Filled 2012-06-06: qty 2

## 2012-06-06 MED ORDER — KETOROLAC TROMETHAMINE 30 MG/ML IJ SOLN
30.0000 mg | Freq: Once | INTRAMUSCULAR | Status: AC
  Administered 2012-06-06: 30 mg via INTRAVENOUS
  Filled 2012-06-06: qty 1

## 2012-06-06 MED ORDER — OXYCODONE-ACETAMINOPHEN 5-325 MG PO TABS: 1.0000 | ORAL_TABLET | Freq: Four times a day (QID) | ORAL | Status: DC | PRN

## 2012-06-06 MED ORDER — IOHEXOL 300 MG/ML  SOLN
50.0000 mL | Freq: Once | INTRAMUSCULAR | Status: AC | PRN
  Administered 2012-06-06: 50 mL via ORAL

## 2012-06-06 MED ORDER — HYDROMORPHONE HCL PF 1 MG/ML IJ SOLN
1.0000 mg | Freq: Once | INTRAMUSCULAR | Status: AC
  Administered 2012-06-06: 1 mg via INTRAVENOUS
  Filled 2012-06-06: qty 1

## 2012-06-06 MED ORDER — ONDANSETRON HCL 4 MG/2ML IJ SOLN
4.0000 mg | Freq: Once | INTRAMUSCULAR | Status: AC
  Administered 2012-06-06: 4 mg via INTRAVENOUS
  Filled 2012-06-06: qty 2

## 2012-06-06 MED ORDER — IOHEXOL 300 MG/ML  SOLN
100.0000 mL | Freq: Once | INTRAMUSCULAR | Status: AC | PRN
  Administered 2012-06-06: 100 mL via INTRAVENOUS

## 2012-06-06 MED ORDER — ONDANSETRON HCL 4 MG/2ML IJ SOLN
4.0000 mg | Freq: Once | INTRAMUSCULAR | Status: AC
  Administered 2012-06-06: 4 mg via INTRAVENOUS

## 2012-06-06 NOTE — ED Notes (Signed)
Pt requesting pain meds

## 2012-06-06 NOTE — ED Notes (Signed)
Pt sts epigastric pain associated with nausea that started last night. Denies vomiting or diarrhea. sts some SOB.

## 2012-06-06 NOTE — ED Provider Notes (Signed)
History     CSN: [id number]  Arrival date & time 06/06/12  1346   First MD Initiated Contact with Patient 06/06/12 1832      Chief Complaint  Patient presents with  . Abdominal Pain    (Consider location/radiation/quality/duration/timing/severity/associated sxs/prior treatment) HPI  Past Medical History  Diagnosis Date  . Asthma   . Anxiety   . Bipolar 1 disorder     Past Surgical History  Procedure Date  . Back surgery     History reviewed. No pertinent family history.  History  Substance Use Topics  . Smoking status: Current Every Day Smoker  . Smokeless tobacco: Not on file  . Alcohol Use: No    OB History    Grav Para Term Preterm Abortions TAB SAB Ect Mult Living                  Review of Systems  Allergies  Penicillins  Home Medications   Current Outpatient Rx  Name  Route  Sig  Dispense  Refill  . CLONAZEPAM 1 MG PO TABS   Oral   Take 1 mg by mouth 3 (three) times daily.         Marland Kitchen FEXOFENADINE HCL 180 MG PO TABS   Oral   Take 180 mg by mouth daily.         Marland Kitchen HYDROCODONE-ACETAMINOPHEN 10-325 MG PO TABS   Oral   Take 1 tablet by mouth 3 (three) times daily.         . IBUPROFEN 800 MG PO TABS   Oral   Take 800 mg by mouth 3 (three) times daily with meals.         . IMIPRAMINE HCL PO   Oral   Take 1 capsule by mouth at bedtime.         . METHOCARBAMOL 500 MG PO TABS   Oral   Take 500 mg by mouth 3 (three) times daily.         Marland Kitchen OXCARBAZEPINE 600 MG PO TABS   Oral   Take 600 mg by mouth 2 (two) times daily.         Marland Kitchen PREGABALIN 75 MG PO CAPS   Oral   Take 150 mg by mouth 2 (two) times daily.         . TOPIRAMATE 50 MG PO TABS   Oral   Take 50 mg by mouth 2 (two) times daily.         . TRAZODONE HCL 100 MG PO TABS   Oral   Take 200 mg by mouth at bedtime.           BP 158/84  Pulse 91  Temp 97.7 F (36.5 C) (Oral)  Resp 16  SpO2 97%  Physical Exam  ED Course  Procedures (including critical  care time)  Labs Reviewed  CBC WITH DIFFERENTIAL - Abnormal; Notable for the following:    WBC 16.4 (*)     Neutrophils Relative 78 (*)     Neutro Abs 12.8 (*)     Monocytes Absolute 1.2 (*)     All other components within normal limits  COMPREHENSIVE METABOLIC PANEL - Abnormal; Notable for the following:    Glucose, Bld 103 (*)     Alkaline Phosphatase 125 (*)     All other components within normal limits  LIPASE, BLOOD  POCT I-STAT TROPONIN I   Dg Chest 2 View  06/06/2012  *RADIOLOGY REPORT*  Clinical Data: Chest pain and  shortness of breath.  CHEST - 2 VIEW  Comparison: 03/10/2009 and prior chest radiographs  Findings: Upper limits normal heart size again identified. Mild peribronchial thickening is noted.  There is no evidence of focal airspace disease, pulmonary edema, suspicious pulmonary nodule/mass, pleural effusion, or pneumothorax. No acute bony abnormalities are identified.  Cervical surgical changes are again noted.  IMPRESSION: Mild peribronchial thickening / bronchitis without evidence of focal pneumonia.  Upper limits normal heart size.   Original Report Authenticated By: Harmon Pier, M.D.    Ct Abdomen Pelvis W Contrast  06/06/2012  *RADIOLOGY REPORT*  Clinical Data: Epigastric pain, nausea, vomiting  CT ABDOMEN AND PELVIS WITH CONTRAST  Technique:  Multidetector CT imaging of the abdomen and pelvis was performed following the standard protocol during bolus administration of intravenous contrast.  Contrast: OMNIPAQUE IOHEXOL 300 MG/ML  SOLN  Comparison: CT abdomen 10/28/2006  Findings: There is mild basilar atelectasis.  No pericardial fluid. There is a hypodense lesion in the posterior right hepatic lobe measuring 10 mm.  This is unchanged in size from CT of 2008 consistent with benign etiology.  The gallbladder, pancreas, spleen, adrenal glands, and kidneys are normal.  There is mild stranding within the retroperitoneal fat surrounding the SMA and celiac trunk (image 23  through 29).  The stomach, small bowel, and cecum are normal.  Post appendectomy. Colon is normal.  Abdominal aorta normal caliber.  No retroperitoneal periportal lymphadenopathy.  No free fluid the pelvis.  The bladder is normal.  Post hysterectomy anatomy.  The ovaries are normal. Review of  bone windows demonstrates no aggressive osseous lesions.  Posterior lumbar fusion without complication.  IMPRESSION:  1.  Mild retroperitoneal fat stranding adjacent to the celiac trunk.  Recommend correlation with pancreatitis or arteritis.  2.  No additional acute findings in  the abdomen pelvis. 3.  Posterior lumbar fusion without complication.   Original Report Authenticated By: Genevive Bi, M.D.      1. Abdominal pain       MDM  Pt originally seen by Dr. Estell Harpin to await CT scan of the abdomen to ro gall bladder vs gastroenteritis.   The findings show no acute abnormalities. The patient is not currently having pain although she feels it may start hurting.   Will Rx pain and nausea medication and refer to GI or her PCP. Whichever she can see first.  I have discussed this with the patient, the results and treatment plan and she is comfortable with this plan.  Pt has been advised of the symptoms that warrant their return to the ED. Patient has voiced understanding and has agreed to follow-up with the PCP or specialist.         Dorthula Matas, PA 06/06/12 2302

## 2012-06-06 NOTE — ED Notes (Signed)
Advised of the wait time 

## 2012-06-06 NOTE — ED Provider Notes (Signed)
History     CSN: [id number]  Arrival date & time 06/06/12  1346   First MD Initiated Contact with Patient 06/06/12 1832      Chief Complaint  Patient presents with  . Abdominal Pain    (Consider location/radiation/quality/duration/timing/severity/associated sxs/prior treatment) Patient is a 52 y.o. female presenting with abdominal pain. The history is provided by the patient (pt complains of abd pain and vomiting). No language interpreter was used.  Abdominal Pain The primary symptoms of the illness include abdominal pain. The primary symptoms of the illness do not include fatigue or diarrhea. The current episode started 3 to 5 hours ago. The onset of the illness was sudden. The problem has not changed since onset. Associated with: nothing. The patient states that she believes she is currently not pregnant. The patient has not had a change in bowel habit. Additional symptoms associated with the illness include anorexia. Symptoms associated with the illness do not include chills, hematuria, frequency or back pain. Significant associated medical issues do not include sickle cell disease.    Past Medical History  Diagnosis Date  . Asthma   . Anxiety   . Bipolar 1 disorder     Past Surgical History  Procedure Date  . Back surgery     History reviewed. No pertinent family history.  History  Substance Use Topics  . Smoking status: Current Every Day Smoker  . Smokeless tobacco: Not on file  . Alcohol Use: No    OB History    Grav Para Term Preterm Abortions TAB SAB Ect Mult Living                  Review of Systems  Constitutional: Negative for chills and fatigue.  HENT: Negative for congestion, sinus pressure and ear discharge.   Eyes: Negative for discharge.  Respiratory: Negative for cough.   Cardiovascular: Negative for chest pain.  Gastrointestinal: Positive for abdominal pain and anorexia. Negative for diarrhea.  Genitourinary: Negative for frequency and  hematuria.  Musculoskeletal: Negative for back pain.  Skin: Negative for rash.  Neurological: Negative for seizures and headaches.  Hematological: Negative.   Psychiatric/Behavioral: Negative for hallucinations.    Allergies  Penicillins  Home Medications   Current Outpatient Rx  Name  Route  Sig  Dispense  Refill  . CLONAZEPAM 1 MG PO TABS   Oral   Take 1 mg by mouth 3 (three) times daily.         Marland Kitchen FEXOFENADINE HCL 180 MG PO TABS   Oral   Take 180 mg by mouth daily.         Marland Kitchen HYDROCODONE-ACETAMINOPHEN 10-325 MG PO TABS   Oral   Take 1 tablet by mouth 3 (three) times daily.         . IBUPROFEN 800 MG PO TABS   Oral   Take 800 mg by mouth 3 (three) times daily with meals.         . IMIPRAMINE HCL PO   Oral   Take 1 capsule by mouth at bedtime.         . METHOCARBAMOL 500 MG PO TABS   Oral   Take 500 mg by mouth 3 (three) times daily.         Marland Kitchen OXCARBAZEPINE 600 MG PO TABS   Oral   Take 600 mg by mouth 2 (two) times daily.         Marland Kitchen PREGABALIN 75 MG PO CAPS   Oral   Take 150  mg by mouth 2 (two) times daily.         . TOPIRAMATE 50 MG PO TABS   Oral   Take 50 mg by mouth 2 (two) times daily.         . TRAZODONE HCL 100 MG PO TABS   Oral   Take 200 mg by mouth at bedtime.           BP 149/98  Pulse 97  Temp 98.1 F (36.7 C)  Resp 18  SpO2 98%  Physical Exam  Constitutional: She is oriented to person, place, and time. She appears well-developed.  HENT:  Head: Normocephalic and atraumatic.  Eyes: Conjunctivae normal and EOM are normal. No scleral icterus.  Neck: Neck supple. No thyromegaly present.  Cardiovascular: Normal rate and regular rhythm.  Exam reveals no gallop and no friction rub.   No murmur heard. Pulmonary/Chest: No stridor. She has no wheezes. She has no rales. She exhibits no tenderness.  Abdominal: She exhibits no distension. There is tenderness. There is no rebound.       Mild epigastric tenderness    Musculoskeletal: Normal range of motion. She exhibits no edema.  Lymphadenopathy:    She has no cervical adenopathy.  Neurological: She is oriented to person, place, and time. Coordination normal.  Skin: No rash noted. No erythema.  Psychiatric: She has a normal mood and affect. Her behavior is normal.    ED Course  Procedures (including critical care time)  Labs Reviewed  CBC WITH DIFFERENTIAL - Abnormal; Notable for the following:    WBC 16.4 (*)     Neutrophils Relative 78 (*)     Neutro Abs 12.8 (*)     Monocytes Absolute 1.2 (*)     All other components within normal limits  COMPREHENSIVE METABOLIC PANEL - Abnormal; Notable for the following:    Glucose, Bld 103 (*)     Alkaline Phosphatase 125 (*)     All other components within normal limits  LIPASE, BLOOD  POCT I-STAT TROPONIN I   Dg Chest 2 View  06/06/2012  *RADIOLOGY REPORT*  Clinical Data: Chest pain and shortness of breath.  CHEST - 2 VIEW  Comparison: 03/10/2009 and prior chest radiographs  Findings: Upper limits normal heart size again identified. Mild peribronchial thickening is noted.  There is no evidence of focal airspace disease, pulmonary edema, suspicious pulmonary nodule/mass, pleural effusion, or pneumothorax. No acute bony abnormalities are identified.  Cervical surgical changes are again noted.  IMPRESSION: Mild peribronchial thickening / bronchitis without evidence of focal pneumonia.  Upper limits normal heart size.   Original Report Authenticated By: Harmon Pier, M.D.      No diagnosis found.    MDM          Benny Lennert, MD 06/06/12 2010

## 2012-08-25 ENCOUNTER — Other Ambulatory Visit: Payer: Self-pay

## 2012-08-25 ENCOUNTER — Other Ambulatory Visit: Payer: Self-pay | Admitting: Family Medicine

## 2012-08-25 MED ORDER — ESOMEPRAZOLE MAGNESIUM 40 MG PO CPDR: 40.0000 mg | DELAYED_RELEASE_CAPSULE | Freq: Every day | ORAL | Status: DC

## 2012-08-25 NOTE — Telephone Encounter (Signed)
Chart has Nexium crossed out

## 2012-09-11 ENCOUNTER — Other Ambulatory Visit: Payer: Self-pay | Admitting: *Deleted

## 2012-09-11 NOTE — Telephone Encounter (Signed)
Pt is out of clonazepam 1mg  take 1qid and topiramate 100mg  2qhs, she has [facility]t [facility] on 4/30 can we refill enough until her appt with them.

## 2012-09-12 NOTE — Telephone Encounter (Signed)
I agree with this  

## 2012-09-19 MED ORDER — TOPIRAMATE 50 MG PO TABS: 50.0000 mg | ORAL_TABLET | Freq: Two times a day (BID) | ORAL | Status: DC

## 2012-09-19 MED ORDER — CLONAZEPAM 1 MG PO TABS: 1.0000 mg | ORAL_TABLET | Freq: Four times a day (QID) | ORAL | Status: DC

## 2012-09-26 ENCOUNTER — Other Ambulatory Visit: Payer: Self-pay | Admitting: Nurse Practitioner

## 2012-09-30 ENCOUNTER — Other Ambulatory Visit: Payer: Self-pay

## 2012-09-30 MED ORDER — ALBUTEROL SULFATE HFA 108 (90 BASE) MCG/ACT IN AERS: 2.0000 | INHALATION_SPRAY | Freq: Four times a day (QID) | RESPIRATORY_TRACT | Status: DC | PRN

## 2012-10-01 NOTE — Telephone Encounter (Signed)
Done 09-30-12 

## 2012-10-01 NOTE — Telephone Encounter (Signed)
Already done on 09-30-12

## 2012-10-01 NOTE — Telephone Encounter (Signed)
Done yesterday.

## 2012-10-01 NOTE — Telephone Encounter (Signed)
Done by Debbi on 09-30-12

## 2012-10-08 ENCOUNTER — Other Ambulatory Visit: Payer: Self-pay | Admitting: Nurse Practitioner

## 2012-10-21 ENCOUNTER — Ambulatory Visit (INDEPENDENT_AMBULATORY_CARE_PROVIDER_SITE_OTHER): Payer: Medicare Other

## 2012-10-21 ENCOUNTER — Ambulatory Visit (INDEPENDENT_AMBULATORY_CARE_PROVIDER_SITE_OTHER): Payer: Medicare Other | Admitting: Family Medicine

## 2012-10-21 ENCOUNTER — Encounter: Payer: Self-pay | Admitting: Family Medicine

## 2012-10-21 VITALS — BP 109/74 | HR 100 | Temp 98.6°F | Ht 62.0 in | Wt 170.8 lb

## 2012-10-21 DIAGNOSIS — M25562 Pain in left knee: Secondary | ICD-10-CM

## 2012-10-21 DIAGNOSIS — M25569 Pain in unspecified knee: Secondary | ICD-10-CM

## 2012-10-21 NOTE — Progress Notes (Signed)
Patient ID: Darlene Wilcox, female   DOB: 09-06-1960, 52 y.o.   MRN: [id number] SUBJECTIVE: HPI: 2-3 weeks ago was getting out of the car and twisted the knee. The foot was rotated counterclockwise. Thought it would get better but it hasn't.. The knee popped twice at the time of the injury. It gives way and is weak. And puffy  PMH/PSH: reviewed/updated in Epic  SH/FH: reviewed/updated in Epic  Allergies: reviewed/updated in Epic  Medications: reviewed/updated in Epic  Immunizations: reviewed/updated in Epic  ROS: As above in the HPI. All other systems are stable or negative.  OBJECTIVE: APPEARANCE:  Patient in no acute distress.The patient appeared well nourished and normally developed. Acyanotic. Waist: VITAL SIGNS:BP 109/74  Pulse 100  Temp(Src) 98.6 F (37 C) (Oral)  Ht 5\' 2"  (1.575 m)  Wt 170 lb 12.8 oz (77.474 kg)  BMI 31.23 kg/m2 WF obese.  SKIN: warm and  Dry without overt rashes, tattoos and scars  HEAD and Neck: without JVD, Head and scalp: normal Eyes:No scleral icterus. Fundi normal, eye movements normal. Ears: Auricle normal, canal normal, Tympanic membranes normal, insufflation normal. Nose: normal Throat: normal Neck & thyroid: normal  CHEST & LUNGS: Chest wall: normal Lungs: Clear  CVS: Reveals the PMI to be normally located. Regular rhythm, First and Second Heart sounds are normal,  absence of murmurs, rubs or gallops. Peripheral vasculature: Radial pulses: normal Dorsal pedis pulses: normal Posterior pulses: normal  ABDOMEN:  Appearance: normal Benign,, no organomegaly, no masses, no Abdominal Aortic enlargement. No Guarding , no rebound. No Bruits. Bowel sounds: normal  RECTAL: N/A GU: N/A  EXTREMETIES: nonedematous. Both Femoral and Pedal pulses are normal.  MUSCULOSKELETAL:  Spine: normal Joints:lefdt knee. Small effusion. Tenderness on palpation and  Stressing the medial collateral ligament. cruciates are intact. Grind  test positive.  NEUROLOGIC: oriented to time,place and person; nonfocal. Strength is normal  ASSESSMENT: Knee pain, acute, left - Plan: DG Knee 1-2 Views Left, MR Knee Left  Wo Contrast   PLAN: Rx for left knee immobilizer Ice packs. Use a cane. Rx written for the cane Plan on a MRI Orders Placed This Encounter  Procedures  . DG Knee 1-2 Views Left    Standing Status: Future     Number of Occurrences: 1     Standing Expiration Date: 12/21/2013    Order Specific Question:  Reason for Exam (SYMPTOM  OR DIAGNOSIS REQUIRED)    Answer:  L knee pain and weakness x 3 weeks after injury    Order Specific Question:  Is the patient pregnant?    Answer:  No    Order Specific Question:  Preferred imaging location?    Answer:  Internal  . MR Knee Left  Wo Contrast    Standing Status: Future     Number of Occurrences:      Standing Expiration Date: 12/21/2013    Order Specific Question:  Reason for Exam (SYMPTOM  OR DIAGNOSIS REQUIRED)    Answer:  left knee injury R/o meniscus tear    Order Specific Question:  Is the patient pregnant?    Answer:  No    Order Specific Question:  Preferred imaging location?    Answer:  [facility]    Order Specific Question:  Does the patient have a pacemaker, internal devices, implants, aneury    Answer:  No   .await the MRI RTC prn pending the MRI,.   Damier Disano P. Modesto Charon, M.D.

## 2012-10-22 NOTE — Progress Notes (Signed)
Patient ID: Darlene Wilcox, female   DOB: 03-22-1961, 52 y.o.   MRN: [id number] [facility] reading (PRIMARY) by  Dr. Modesto Charon: No acute findings.Xray left knee.  Naimah Yingst P. Modesto Charon, M.D.

## 2012-10-28 ENCOUNTER — Ambulatory Visit (HOSPITAL_COMMUNITY): Admission: RE | Admit: 2012-10-28 | Payer: Medicare Other | Source: Ambulatory Visit

## 2012-11-06 ENCOUNTER — Other Ambulatory Visit: Payer: Self-pay | Admitting: Orthopaedic Surgery

## 2012-11-06 DIAGNOSIS — M25562 Pain in left knee: Secondary | ICD-10-CM

## 2012-11-12 ENCOUNTER — Other Ambulatory Visit: Payer: Self-pay | Admitting: *Deleted

## 2012-11-12 NOTE — Telephone Encounter (Signed)
Last filled 09/15/12, last seen 10/21/12. If approved have Almira Coaster H call into [facility]

## 2012-11-20 ENCOUNTER — Telehealth: Payer: Self-pay

## 2012-11-20 NOTE — Telephone Encounter (Signed)
Received call from Dr Guss Bunde [phone] and Neysa Bonito states they will be prescribing all meds :  Clonopin, Geodan, Topamax, and Prozac  Would like for Dr Modesto Charon not to prescribe those meds as they will be managing her meds.

## 2012-12-09 ENCOUNTER — Ambulatory Visit
Admission: RE | Admit: 2012-12-09 | Discharge: 2012-12-09 | Disposition: A | Discharge status: Home / Self Care | Payer: Medicare Other | Source: Ambulatory Visit | Attending: Orthopaedic Surgery | Admitting: Orthopaedic Surgery

## 2012-12-09 DIAGNOSIS — M25562 Pain in left knee: Secondary | ICD-10-CM

## 2012-12-17 ENCOUNTER — Ambulatory Visit (INDEPENDENT_AMBULATORY_CARE_PROVIDER_SITE_OTHER): Payer: Medicare Other | Admitting: *Deleted

## 2012-12-17 DIAGNOSIS — Z23 Encounter for immunization: Secondary | ICD-10-CM

## 2012-12-17 NOTE — Patient Instructions (Addendum)

## 2012-12-17 NOTE — Progress Notes (Signed)
Patient tolerated well.

## 2012-12-26 DIAGNOSIS — G5601 Carpal tunnel syndrome, right upper limb: Secondary | ICD-10-CM

## 2012-12-26 HISTORY — DX: Carpal tunnel syndrome, right upper limb: G56.01

## 2012-12-30 ENCOUNTER — Other Ambulatory Visit: Payer: Self-pay | Admitting: Orthopedic Surgery

## 2013-01-06 ENCOUNTER — Encounter (HOSPITAL_BASED_OUTPATIENT_CLINIC_OR_DEPARTMENT_OTHER): Payer: Self-pay | Admitting: *Deleted

## 2013-01-08 NOTE — Pre-Procedure Instructions (Signed)
Office notes pertaining to seizures req. from [facility]; no echo there or at [facility]. (Echo had been ordered in 2009 to r/o cardiogenic syncope.)

## 2013-01-09 NOTE — Pre-Procedure Instructions (Signed)
Seizure history reviewed with Dr. Ivin Booty; pt. OK to come for surgery.

## 2013-01-10 ENCOUNTER — Other Ambulatory Visit: Payer: Self-pay | Admitting: Family Medicine

## 2013-01-13 ENCOUNTER — Ambulatory Visit (HOSPITAL_BASED_OUTPATIENT_CLINIC_OR_DEPARTMENT_OTHER)
Admission: RE | Admit: 2013-01-13 | Discharge: 2013-01-13 | Disposition: A | Discharge status: Home / Self Care | Payer: Medicare Other | Source: Ambulatory Visit | Attending: Orthopedic Surgery | Admitting: Orthopedic Surgery

## 2013-01-13 ENCOUNTER — Encounter (HOSPITAL_BASED_OUTPATIENT_CLINIC_OR_DEPARTMENT_OTHER)
Admission: RE | Disposition: A | Discharge status: Home / Self Care | Payer: Self-pay | Source: Ambulatory Visit | Attending: Orthopedic Surgery

## 2013-01-13 ENCOUNTER — Encounter (HOSPITAL_BASED_OUTPATIENT_CLINIC_OR_DEPARTMENT_OTHER): Payer: Self-pay | Admitting: Certified Registered Nurse Anesthetist

## 2013-01-13 ENCOUNTER — Encounter (HOSPITAL_BASED_OUTPATIENT_CLINIC_OR_DEPARTMENT_OTHER): Payer: Self-pay

## 2013-01-13 ENCOUNTER — Ambulatory Visit (HOSPITAL_BASED_OUTPATIENT_CLINIC_OR_DEPARTMENT_OTHER): Payer: Medicare Other | Admitting: Certified Registered Nurse Anesthetist

## 2013-01-13 DIAGNOSIS — F17200 Nicotine dependence, unspecified, uncomplicated: Secondary | ICD-10-CM | POA: Insufficient documentation

## 2013-01-13 DIAGNOSIS — J45909 Unspecified asthma, uncomplicated: Secondary | ICD-10-CM | POA: Insufficient documentation

## 2013-01-13 DIAGNOSIS — G5600 Carpal tunnel syndrome, unspecified upper limb: Secondary | ICD-10-CM | POA: Insufficient documentation

## 2013-01-13 DIAGNOSIS — Z79899 Other long term (current) drug therapy: Secondary | ICD-10-CM | POA: Insufficient documentation

## 2013-01-13 DIAGNOSIS — Z91018 Allergy to other foods: Secondary | ICD-10-CM | POA: Insufficient documentation

## 2013-01-13 DIAGNOSIS — Z791 Long term (current) use of non-steroidal anti-inflammatories (NSAID): Secondary | ICD-10-CM | POA: Insufficient documentation

## 2013-01-13 DIAGNOSIS — M5030 Other cervical disc degeneration, unspecified cervical region: Secondary | ICD-10-CM | POA: Insufficient documentation

## 2013-01-13 DIAGNOSIS — F319 Bipolar disorder, unspecified: Secondary | ICD-10-CM | POA: Insufficient documentation

## 2013-01-13 DIAGNOSIS — Z91038 Other insect allergy status: Secondary | ICD-10-CM | POA: Insufficient documentation

## 2013-01-13 DIAGNOSIS — F411 Generalized anxiety disorder: Secondary | ICD-10-CM | POA: Insufficient documentation

## 2013-01-13 DIAGNOSIS — K219 Gastro-esophageal reflux disease without esophagitis: Secondary | ICD-10-CM | POA: Insufficient documentation

## 2013-01-13 DIAGNOSIS — G40909 Epilepsy, unspecified, not intractable, without status epilepticus: Secondary | ICD-10-CM | POA: Insufficient documentation

## 2013-01-13 DIAGNOSIS — M5137 Other intervertebral disc degeneration, lumbosacral region: Secondary | ICD-10-CM | POA: Insufficient documentation

## 2013-01-13 DIAGNOSIS — Z981 Arthrodesis status: Secondary | ICD-10-CM | POA: Insufficient documentation

## 2013-01-13 DIAGNOSIS — M51379 Other intervertebral disc degeneration, lumbosacral region without mention of lumbar back pain or lower extremity pain: Secondary | ICD-10-CM | POA: Insufficient documentation

## 2013-01-13 DIAGNOSIS — Z880 Allergy status to penicillin: Secondary | ICD-10-CM | POA: Insufficient documentation

## 2013-01-13 HISTORY — DX: Depression, unspecified: F32.A

## 2013-01-13 HISTORY — DX: Major depressive disorder, single episode, unspecified: F32.9

## 2013-01-13 HISTORY — DX: Personal history of other (healed) physical injury and trauma: Z87.828

## 2013-01-13 HISTORY — DX: Unspecified convulsions: R56.9

## 2013-01-13 HISTORY — DX: Bipolar disorder, unspecified: F31.9

## 2013-01-13 HISTORY — DX: Complete loss of teeth, unspecified cause, unspecified class: Z97.2

## 2013-01-13 HISTORY — DX: Headache: R51

## 2013-01-13 HISTORY — DX: Other chronic pain: G89.29

## 2013-01-13 HISTORY — DX: Dorsalgia, unspecified: M54.9

## 2013-01-13 HISTORY — DX: Gastro-esophageal reflux disease without esophagitis: K21.9

## 2013-01-13 HISTORY — DX: Presence of dental prosthetic device (complete) (partial): Z97.2

## 2013-01-13 HISTORY — DX: Complete loss of teeth, unspecified cause, unspecified class: K08.109

## 2013-01-13 HISTORY — PX: CARPAL TUNNEL RELEASE: SHX101

## 2013-01-13 HISTORY — DX: Carpal tunnel syndrome, right upper limb: G56.01

## 2013-01-13 SURGERY — CARPAL TUNNEL RELEASE
Anesthesia: General | Site: Wrist | Laterality: Right

## 2013-01-13 MED ORDER — PROPOFOL 10 MG/ML IV BOLUS
INTRAVENOUS | Status: DC | PRN
  Administered 2013-01-13: 100 mg via INTRAVENOUS

## 2013-01-13 MED ORDER — LIDOCAINE HCL 2 % IJ SOLN
INTRAMUSCULAR | Status: DC | PRN
  Administered 2013-01-13: 3 mL

## 2013-01-13 MED ORDER — LACTATED RINGERS IV SOLN
INTRAVENOUS | Status: DC
  Administered 2013-01-13 (×2): via INTRAVENOUS

## 2013-01-13 MED ORDER — CHLORHEXIDINE GLUCONATE 4 % EX LIQD: 60.0000 mL | Freq: Once | CUTANEOUS | Status: DC

## 2013-01-13 MED ORDER — OXYCODONE HCL 5 MG/5ML PO SOLN: 5.0000 mg | Freq: Once | ORAL | Status: AC | PRN

## 2013-01-13 MED ORDER — DEXAMETHASONE SODIUM PHOSPHATE 10 MG/ML IJ SOLN
INTRAMUSCULAR | Status: DC | PRN
  Administered 2013-01-13: 10 mg via INTRAVENOUS

## 2013-01-13 MED ORDER — MIDAZOLAM HCL 2 MG/2ML IJ SOLN: 1.0000 mg | INTRAMUSCULAR | Status: DC | PRN

## 2013-01-13 MED ORDER — MIDAZOLAM HCL 2 MG/ML PO SYRP: 12.0000 mg | ORAL_SOLUTION | Freq: Once | ORAL | Status: DC | PRN

## 2013-01-13 MED ORDER — LIDOCAINE HCL (CARDIAC) 20 MG/ML IV SOLN
INTRAVENOUS | Status: DC | PRN
  Administered 2013-01-13: 30 mg via INTRAVENOUS

## 2013-01-13 MED ORDER — ONDANSETRON HCL 4 MG/2ML IJ SOLN
INTRAMUSCULAR | Status: DC | PRN
  Administered 2013-01-13: 4 mg via INTRAVENOUS

## 2013-01-13 MED ORDER — OXYCODONE HCL 5 MG PO TABS
5.0000 mg | ORAL_TABLET | Freq: Once | ORAL | Status: AC | PRN
  Administered 2013-01-13: 5 mg via ORAL

## 2013-01-13 MED ORDER — OXYCODONE-ACETAMINOPHEN 5-325 MG PO TABS: ORAL_TABLET | ORAL | Status: DC

## 2013-01-13 MED ORDER — FENTANYL CITRATE 0.05 MG/ML IJ SOLN
INTRAMUSCULAR | Status: DC | PRN
  Administered 2013-01-13: 25 ug via INTRAVENOUS

## 2013-01-13 MED ORDER — FENTANYL CITRATE 0.05 MG/ML IJ SOLN: 50.0000 ug | INTRAMUSCULAR | Status: DC | PRN

## 2013-01-13 MED ORDER — HYDROMORPHONE HCL PF 1 MG/ML IJ SOLN: 0.2500 mg | INTRAMUSCULAR | Status: DC | PRN

## 2013-01-13 SURGICAL SUPPLY — 37 items
BANDAGE ADHESIVE 1X3 (GAUZE/BANDAGES/DRESSINGS) IMPLANT
BANDAGE ELASTIC 3 VELCRO ST LF (GAUZE/BANDAGES/DRESSINGS) ×2 IMPLANT
BLADE SURG 15 STRL LF DISP TIS (BLADE) ×1 IMPLANT
BLADE SURG 15 STRL SS (BLADE) ×2
BNDG CMPR 9X4 STRL LF SNTH (GAUZE/BANDAGES/DRESSINGS) ×1
BNDG ESMARK 4X9 LF (GAUZE/BANDAGES/DRESSINGS) ×1 IMPLANT
BRUSH SCRUB EZ PLAIN DRY (MISCELLANEOUS) ×2 IMPLANT
CLOTH BEACON ORANGE TIMEOUT ST (SAFETY) ×2 IMPLANT
CORDS BIPOLAR (ELECTRODE) ×1 IMPLANT
COVER MAYO STAND STRL (DRAPES) ×2 IMPLANT
COVER TABLE BACK 60X90 (DRAPES) ×2 IMPLANT
CUFF TOURNIQUET SINGLE 18IN (TOURNIQUET CUFF) IMPLANT
DECANTER SPIKE VIAL GLASS SM (MISCELLANEOUS) IMPLANT
DRAPE EXTREMITY T 121X128X90 (DRAPE) ×2 IMPLANT
DRAPE SURG 17X23 STRL (DRAPES) ×2 IMPLANT
GLOVE BIO SURGEON STRL SZ 6.5 (GLOVE) ×1 IMPLANT
GLOVE BIOGEL M STRL SZ7.5 (GLOVE) ×1 IMPLANT
GLOVE ORTHO TXT STRL SZ7.5 (GLOVE) ×2 IMPLANT
GOWN BRE IMP PREV XXLGXLNG (GOWN DISPOSABLE) ×3 IMPLANT
GOWN PREVENTION PLUS XLARGE (GOWN DISPOSABLE) ×2 IMPLANT
NEEDLE 27GAX1X1/2 (NEEDLE) IMPLANT
PACK BASIN DAY SURGERY FS (CUSTOM PROCEDURE TRAY) ×2 IMPLANT
PAD CAST 3X4 CTTN HI CHSV (CAST SUPPLIES) ×1 IMPLANT
PADDING CAST ABS 4INX4YD NS (CAST SUPPLIES) ×1
PADDING CAST ABS COTTON 4X4 ST (CAST SUPPLIES) ×1 IMPLANT
PADDING CAST COTTON 3X4 STRL (CAST SUPPLIES) ×2
SPLINT PLASTER CAST XFAST 3X15 (CAST SUPPLIES) ×5 IMPLANT
SPLINT PLASTER XTRA FASTSET 3X (CAST SUPPLIES) ×5
SPONGE GAUZE 4X4 12PLY (GAUZE/BANDAGES/DRESSINGS) ×2 IMPLANT
STOCKINETTE 4X48 STRL (DRAPES) ×2 IMPLANT
STRIP CLOSURE SKIN 1/2X4 (GAUZE/BANDAGES/DRESSINGS) ×2 IMPLANT
SUT PROLENE 3 0 PS 2 (SUTURE) ×2 IMPLANT
SYR 3ML 23GX1 SAFETY (SYRINGE) IMPLANT
SYR CONTROL 10ML LL (SYRINGE) IMPLANT
TOWEL OR 17X24 6PK STRL BLUE (TOWEL DISPOSABLE) ×2 IMPLANT
TRAY DSU PREP LF (CUSTOM PROCEDURE TRAY) ×2 IMPLANT
UNDERPAD 30X30 INCONTINENT (UNDERPADS AND DIAPERS) ×2 IMPLANT

## 2013-01-13 NOTE — Anesthesia Preprocedure Evaluation (Addendum)
Anesthesia Evaluation  Patient identified by MRN, date of birth, ID band Patient awake    Reviewed: Allergy & Precautions, H&P , NPO status , Patient's Chart, lab work & pertinent test results  Airway Mallampati: III TM Distance: >3 FB Neck ROM: Full    Dental no notable dental hx. (+) Edentulous Upper, Edentulous Lower and Dental Advisory Given   Pulmonary asthma ,  breath sounds clear to auscultation  Pulmonary exam normal       Cardiovascular negative cardio ROS  Rhythm:Regular Rate:Normal     Neuro/Psych  Headaches, Seizures -, Well Controlled,  PSYCHIATRIC DISORDERS Anxiety Depression  Neuromuscular disease    GI/Hepatic Neg liver ROS, GERD-  Medicated,  Endo/Other  negative endocrine ROS  Renal/GU negative Renal ROS  negative genitourinary   Musculoskeletal   Abdominal   Peds  Hematology negative hematology ROS (+)   Anesthesia Other Findings   Reproductive/Obstetrics negative OB ROS                          Anesthesia Physical Anesthesia Plan  ASA: III  Anesthesia Plan: General   Post-op Pain Management:    Induction: Intravenous  Airway Management Planned: LMA  Additional Equipment:   Intra-op Plan:   Post-operative Plan: Extubation in OR  Informed Consent: I have reviewed the patients History and Physical, chart, labs and discussed the procedure including the risks, benefits and alternatives for the proposed anesthesia with the patient or authorized representative who has indicated his/her understanding and acceptance.   Dental advisory given  Plan Discussed with: CRNA  Anesthesia Plan Comments:         Anesthesia Quick Evaluation

## 2013-01-13 NOTE — Op Note (Signed)
[id number] 

## 2013-01-13 NOTE — Anesthesia Postprocedure Evaluation (Signed)
  Anesthesia Post-op Note  Patient: [name]  Procedure(s) Performed: Procedure(s): CARPAL TUNNEL RELEASE (Right)  Patient Location: PACU  Anesthesia Type:General  Level of Consciousness: awake and alert   Airway and Oxygen Therapy: Patient Spontanous Breathing  Post-op Pain: mild  Post-op Assessment: Post-op Vital signs reviewed, Patient's Cardiovascular Status Stable, Respiratory Function Stable, Patent Airway and No signs of Nausea or vomiting  Post-op Vital Signs: Reviewed and stable  Complications: No apparent anesthesia complications

## 2013-01-13 NOTE — Brief Op Note (Signed)
01/13/2013  10:14 AM  PATIENT:  Darlene Wilcox  52 y.o. female  PRE-OPERATIVE DIAGNOSIS:  BILATERAL CARPAL TUNNEL SYNDROME  POST-OPERATIVE DIAGNOSIS:  right carpal tunnel syndrome  PROCEDURE:  Procedure(s): CARPAL TUNNEL RELEASE (Right)  SURGEON:  Surgeon(s) and Role:    * Wyn Forster., MD - Primary  PHYSICIAN ASSISTANT:   ASSISTANTS: surgical technician  ANESTHESIA:   general  EBL:  Total I/O In: 1000 [I.V.:1000] Out: -   BLOOD ADMINISTERED:none  DRAINS: none   LOCAL MEDICATIONS USED:  XYLOCAINE   SPECIMEN:  No Specimen  DISPOSITION OF SPECIMEN:  N/A  COUNTS:  YES  TOURNIQUET:   Total Tourniquet Time Documented: Upper Arm (Right) - 11 minutes Total: Upper Arm (Right) - 11 minutes   DICTATION: .Other Dictation: Dictation Number [id number]  PLAN OF CARE: discharge home with family  PATIENT DISPOSITION:  PACU - hemodynamically stable.   Delay start of Pharmacological VTE agent (>24hrs) due to surgical blood loss or risk of bleeding: not applicable

## 2013-01-13 NOTE — Transfer of Care (Signed)
Immediate Anesthesia Transfer of Care Note  Patient: Darlene Wilcox  Procedure(s) Performed: Procedure(s): CARPAL TUNNEL RELEASE (Right)  Patient Location: PACU  Anesthesia Type:General  Level of Consciousness: awake and patient cooperative  Airway & Oxygen Therapy: Patient Spontanous Breathing and Patient connected to face mask oxygen  Post-op Assessment: Report given to PACU RN and Post -op Vital signs reviewed and stable  Post vital signs: Reviewed and stable  Complications: No apparent anesthesia complications

## 2013-01-13 NOTE — Anesthesia Procedure Notes (Signed)
Procedure Name: LMA Insertion Date/Time: 01/13/2013 9:45 AM Performed by: Bryton Romagnoli D Pre-anesthesia Checklist: Patient identified, Emergency Drugs available, Suction available and Patient being monitored Patient Re-evaluated:Patient Re-evaluated prior to inductionOxygen Delivery Method: Circle System Utilized Preoxygenation: Pre-oxygenation with 100% oxygen Intubation Type: IV induction Ventilation: Mask ventilation without difficulty LMA: LMA inserted LMA Size: 4.0 Number of attempts: 1 Airway Equipment and Method: bite block Placement Confirmation: positive ETCO2 Tube secured with: Tape Dental Injury: Teeth and Oropharynx as per pre-operative assessment

## 2013-01-13 NOTE — H&P (Signed)
Darlene Wilcox is an 52 y.o. female.   Chief Complaint: Chronic right hand numbness HPI: 52 year old right-hand dominant woman with long history of right-hand numbness. She was diagnosed in 2011 to have significant right carpal tunnel syndrome with possible electrodiagnostic study. She postpone surgery to the right hand due to significant spinal degenerative disease. She's had multiple procedures performed by Dr. Jake Seats:  Noel Gerold Including anterior cervical discectomy and interbody fusion. He had repeat electrodiagnostic studies performed on 12/29/2012 demonstrating persistent right carpal tunnel syndrome with a markedly diminished sensory amplitude and diminished motor amplitude.  Past Medical History  Diagnosis Date  . Anxiety   . Seizures     states "silent seizures"; is on anticonvulsant; none in 2-3 mos.  . Headache(784.0)     1-2 x/month  . Asthma     daily inhaler  . GERD (gastroesophageal reflux disease)   . Full dentures   . Bipolar disorder   . Depression   . Chronic back pain greater than 3 months duration   . 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 History of ankle sprain     right; 2 weeks ago;  c/o severe pain    Past Surgical History  Procedure Laterality Date  . Cervical fusion      x 2  . Tubal ligation    . Abdominal hysterectomy      partial  . Inguinal hernia repair Right   . Bunionectomy Left   . Diagnostic laparoscopy  11/08/2006  . Laparoscopic appendectomy  11/08/2006  . Lumbar laminectomy  02/05/2007    L4-5 fusion    History reviewed. No pertinent family history. Social History:  reports that she has been smoking Cigarettes.  She has a 7.5 pack-year smoking history. She has never used smokeless tobacco. She reports that she does not drink alcohol or use illicit drugs.  Allergies:  Allergies  Allergen Reactions  . Apple Shortness Of Breath  . Penicillins Shortness Of Breath, Itching and Swelling  . Bee Venom Swelling    Medications Prior to  Admission  Medication Sig Dispense Refill  . clonazePAM (KLONOPIN) 1 MG tablet Take 1 tablet (1 mg total) by mouth 4 (four) times daily.  30 tablet  0  . esomeprazole (NEXIUM) 40 MG capsule Take 1 capsule (40 mg total) by mouth daily.  30 capsule  3  . fexofenadine (ALLEGRA) 180 MG tablet Take 180 mg by mouth daily.      Marland Kitchen FLUoxetine (PROZAC) 20 MG capsule Take 20 mg by mouth every morning.      Marland Kitchen HYDROcodone-acetaminophen (NORCO) 10-325 MG per tablet Take 1 tablet by mouth 3 (three) times daily.      Marland Kitchen ibuprofen (ADVIL,MOTRIN) 800 MG tablet Take 800 mg by mouth 3 (three) times daily with meals.      . IMIPRAMINE HCL PO Take 2 capsules by mouth at bedtime.       . meloxicam (MOBIC) 15 MG tablet Take 15 mg by mouth daily.      . methocarbamol (ROBAXIN) 500 MG tablet Take 500 mg by mouth 2 (two) times daily.       Marland Kitchen oxcarbazepine (TRILEPTAL) 600 MG tablet Take 600 mg by mouth 2 (two) times daily.      . pregabalin (LYRICA) 75 MG capsule Take 150 mg by mouth daily.       Marland Kitchen PROAIR HFA 108 (90 BASE) MCG/ACT inhaler INHALE 2 PUFFS INTO THE LUNGS EVERY SIX HOURS AS NEEDED FOR WHEEZING  8.5 each  1  . topiramate (TOPAMAX) 50 MG tablet Take 1 tablet (50 mg total) by mouth 2 (two) times daily.  60 tablet  1  . traZODone (DESYREL) 100 MG tablet Take 200 mg by mouth at bedtime.      . ziprasidone (GEODON) 80 MG capsule Take 80 mg by mouth 2 (two) times daily with a meal.        No results found for this or any previous visit (from the past 48 hour(s)). No results found.  Review of Systems  Constitutional: Negative.   HENT: Negative.   Eyes: Negative.   Respiratory: Negative.   Cardiovascular: Negative.   Gastrointestinal: Negative.   Genitourinary: Negative.   Musculoskeletal:       Chronic right carpal tunnel syndrome with positive electrodiagnostic studies in 2011 and 2014  Skin: Negative.   Neurological:       History of seizures.  History of complex cervical spine degenerative disease and  lumbar degenerative disease  Endo/Heme/Allergies: Negative.   Psychiatric/Behavioral: Positive for depression. The patient is nervous/anxious.     Blood pressure 113/75, pulse 96, temperature 97.9 F (36.6 C), temperature source Oral, resp. rate 20, height 5\' 2"  (1.575 m), weight 82.373 kg (181 lb 9.6 oz), SpO2 97.00%. Physical Exam   Assessment/Plan Chronic right carpal tunnel syndrome.  Release of right transverse carpal ligament. Surgery, aftercare, potential risks and benefits were explained in detail. Questions were invited and answered.  Baley Lorimer JR,Cashlynn Yearwood V 01/13/2013, 9:29 AM

## 2013-01-13 NOTE — Op Note (Signed)
Darlene Wilcox NO.:  [id number]  MEDICAL RECORD NO.:  [id number]  LOCATION:                               FACILITY:  [facility]  PHYSICIAN:  Katy Fitch. Ellyssa Zagal, M.D. DATE OF BIRTH:  1960/12/31  DATE OF PROCEDURE:  01/13/2013 DATE OF DISCHARGE:  01/13/2013                              OPERATIVE REPORT   PREOPERATIVE DIAGNOSIS:  Chronic right carpal tunnel present 2011 until this time.  POSTOPERATIVE DIAGNOSIS:  Chronic right carpal tunnel present 2011 until this time.  OPERATION:  Release of right transverse carpal ligament.  OPERATING SURGEON:  Katy Fitch. Khaliq Turay, MD.  ASSISTANT:  Surgical technician.  ANESTHESIA:  General by LMA.  SUPERVISING ANESTHESIOLOGIST:  Zenon Mayo, MD.  INDICATIONS:  Darlene Wilcox is an unfortunate 52 year old woman with severe spinal arthritis.  She was referred by her primary care physician, Dr. Tanya Nones for evaluation and management of hand numbness. She was noted in 2011 to have right carpal tunnel syndrome.  We also noted severe cervical degenerative disk disease.  She was referred for an orthopedic spine surgery consult with Dr. Sharolyn Douglas, who ultimately performed anterior cervical diskectomy and interbody fusion.  She returned in 2014 with persistent right carpal tunnel syndrome. Electrodiagnostic studies revealed persistent carpal tunnel syndrome with diminished sensory and motor amplitudes.  We advised her to proceed with release of the transverse carpal ligament at this time.  Preoperatively, she was advised of potential risks and benefits of surgery.  Questions were invited and answered in detail.  PROCEDURE:  Darlene Wilcox was brought to room 2 of the [facility] and placed in supine position on the operating table.  She had been interviewed by Dr. Sampson Goon in the holding area and general anesthesia by LMA technique recommended and accepted.  In room 2 under Dr. Jarrett Ables direct  supervision, general anesthesia by LMA technique was induced followed by routine Betadine scrub and paint of the right upper extremity.  Sterile stockinette and impervious arthroscopy drapes were applied. Upon exsanguination of the right arm with Esmarch bandage, an arterial tourniquet on the proximal brachium was inflated to 220 mmHg.  Following routine surgical time-out, procedure commenced with a short incision in the line of the ring finger in the palm.  Subcutaneous tissues were carefully divided revealing the palmar fascia.  This was split longitudinally to reveal the common sensory branch of the median nerve. They were followed to the median nerve proper which was carefully separated from the transverse carpal ligament with a Penfield 4 Engineer, structural.  The transverse carpal ligament was then released subcutaneously into the distal forearm.  The volar forearm fascia was split with scissors 6 cm above the distal wrist flexion crease.  The ulnar bursa was inspected and found to be fibrotic and thickened. No mass or other predicaments were noted.  Bleeding points along the margin of the released ligament were electrocauterized with bipolar current followed by repair of the skin with intradermal 3-0 Prolene suture.  A compressive dressing was applied with a volar plaster splint maintaining the wrist in 15 degrees of dorsiflexion.  A 2% lidocaine was infiltrated along the wound margins for postoperative analgesia.     Katy Fitch  Brailyn Delman, M.D.     RVS/MEDQ  D:  01/13/2013  T:  01/13/2013  Job:  [id number]  cc:   Damita Dunnings, MD

## 2013-01-14 ENCOUNTER — Encounter (HOSPITAL_BASED_OUTPATIENT_CLINIC_OR_DEPARTMENT_OTHER): Payer: Self-pay | Admitting: Orthopedic Surgery

## 2013-02-16 ENCOUNTER — Ambulatory Visit: Payer: Medicare Other | Attending: Orthopedic Surgery | Admitting: Physical Therapy

## 2013-02-16 DIAGNOSIS — M25569 Pain in unspecified knee: Secondary | ICD-10-CM | POA: Insufficient documentation

## 2013-02-16 DIAGNOSIS — R5381 Other malaise: Secondary | ICD-10-CM | POA: Insufficient documentation

## 2013-02-16 DIAGNOSIS — M25669 Stiffness of unspecified knee, not elsewhere classified: Secondary | ICD-10-CM | POA: Insufficient documentation

## 2013-02-16 DIAGNOSIS — IMO0001 Reserved for inherently not codable concepts without codable children: Secondary | ICD-10-CM | POA: Insufficient documentation

## 2013-02-24 ENCOUNTER — Ambulatory Visit: Payer: Medicare Other | Admitting: Physical Therapy

## 2013-02-26 ENCOUNTER — Ambulatory Visit (INDEPENDENT_AMBULATORY_CARE_PROVIDER_SITE_OTHER): Payer: Medicare Other

## 2013-02-26 ENCOUNTER — Encounter: Payer: Self-pay | Admitting: Family Medicine

## 2013-02-26 ENCOUNTER — Ambulatory Visit (INDEPENDENT_AMBULATORY_CARE_PROVIDER_SITE_OTHER): Payer: Medicare Other | Admitting: Family Medicine

## 2013-02-26 VITALS — BP 129/83 | HR 88 | Temp 97.0°F | Wt 180.8 lb

## 2013-02-26 DIAGNOSIS — M79609 Pain in unspecified limb: Secondary | ICD-10-CM

## 2013-02-26 DIAGNOSIS — M79672 Pain in left foot: Secondary | ICD-10-CM

## 2013-02-26 MED ORDER — NAPROXEN 500 MG PO TABS: 500.0000 mg | ORAL_TABLET | Freq: Two times a day (BID) | ORAL | Status: DC

## 2013-02-26 NOTE — Patient Instructions (Signed)

## 2013-02-26 NOTE — Progress Notes (Signed)
  Subjective:    Patient ID: Darlene Wilcox, female    DOB: Jul 20, 1960, 52 y.o.   MRN: [id number]  HPI This 52 y.o. female presents for evaluation of left heel and foot pain for a week after Wearing high heel shoes.  She is having difficulty when she gets up in the am.   Review of Systems C/o heel pain   No chest pain, SOB, HA, dizziness, vision change, N/V, diarrhea, constipation, dysuria, urinary urgency . Objective:   Physical Exam Vital signs noted  Well developed well nourished female.  HEENT - Head atraumatic Normocephalic                Eyes - PERRLA, Conjuctiva - clear Sclera- Clear EOMI                Ears - EAC's Wnl TM's Wnl Gross Hearing WNL                Nose - Nares patent                 Throat - oropharanx wnl Respiratory - Lungs CTA bilateral Cardiac - RRR S1 and S2 without murmur MS - TTP left heel.   Xray left heel - Heel spur Prelimnary reading by Angeline Slim    Assessment & Plan:  Heel pain, left - Plan: DG Foot Complete Left Discussed stretching foot across frozen water bottle.  Naprosyn 500mg  one po bid x 15 days Follow up prn.

## 2013-02-27 ENCOUNTER — Ambulatory Visit: Payer: Medicare Other | Attending: Orthopedic Surgery

## 2013-02-27 DIAGNOSIS — R5381 Other malaise: Secondary | ICD-10-CM | POA: Insufficient documentation

## 2013-02-27 DIAGNOSIS — M25569 Pain in unspecified knee: Secondary | ICD-10-CM | POA: Insufficient documentation

## 2013-02-27 DIAGNOSIS — IMO0001 Reserved for inherently not codable concepts without codable children: Secondary | ICD-10-CM | POA: Insufficient documentation

## 2013-02-27 DIAGNOSIS — M25669 Stiffness of unspecified knee, not elsewhere classified: Secondary | ICD-10-CM | POA: Insufficient documentation

## 2013-03-03 ENCOUNTER — Encounter: Payer: Medicare Other | Admitting: Physical Therapy

## 2013-03-05 ENCOUNTER — Encounter: Payer: Medicare Other | Admitting: Physical Therapy

## 2013-03-11 ENCOUNTER — Other Ambulatory Visit: Payer: Self-pay | Admitting: Family Medicine

## 2013-03-16 ENCOUNTER — Ambulatory Visit (INDEPENDENT_AMBULATORY_CARE_PROVIDER_SITE_OTHER): Payer: Medicare Other | Admitting: Family Medicine

## 2013-03-16 VITALS — BP 117/72 | HR 91 | Temp 97.0°F | Wt 185.0 lb

## 2013-03-16 DIAGNOSIS — J069 Acute upper respiratory infection, unspecified: Secondary | ICD-10-CM

## 2013-03-16 MED ORDER — AZITHROMYCIN 250 MG PO TABS: ORAL_TABLET | ORAL | Status: DC

## 2013-03-16 MED ORDER — BENZONATATE 200 MG PO CAPS: 200.0000 mg | ORAL_CAPSULE | Freq: Two times a day (BID) | ORAL | Status: DC | PRN

## 2013-03-16 NOTE — Patient Instructions (Signed)

## 2013-03-16 NOTE — Progress Notes (Signed)
  Subjective:    Patient ID: Darlene Wilcox, female    DOB: May 21, 1961, 52 y.o.   MRN: [id number]  HPI This 52 y.o. female presents for evaluation of URI sx's for over a week.   Review of Systems C/o uri sx's for over a week. No chest pain, SOB, HA, dizziness, vision change, N/V, diarrhea, constipation, dysuria, urinary urgency or frequency, myalgias, arthralgias or rash.     Objective:   Physical Exam Vital signs noted  Well developed well nourished female.  HEENT - Head atraumatic Normocephalic                Eyes - PERRLA, Conjuctiva - clear Sclera- Clear EOMI                Ears - EAC's Wnl TM's Wnl Gross Hearing WNL                Throat - oropharanx injected Respiratory - Lungs CTA bilateral Cardiac - RRR S1 and S2 without murmur GI - Abdomen soft Nontender and bowel sounds active x 4 Extremities - No edema. Neuro - Grossly intact.       Assessment & Plan:  URI (upper respiratory infection) - Plan: benzonatate (TESSALON) 200 MG capsule, azithromycin (ZITHROMAX) 250 MG tablet Push po fluids, rest, and follow up prn.  Deatra Canter FNP

## 2013-03-29 ENCOUNTER — Other Ambulatory Visit: Payer: Self-pay | Admitting: Family Medicine

## 2013-03-30 ENCOUNTER — Encounter: Payer: Self-pay | Admitting: Nurse Practitioner

## 2013-03-30 ENCOUNTER — Ambulatory Visit (INDEPENDENT_AMBULATORY_CARE_PROVIDER_SITE_OTHER): Payer: Medicare Other | Admitting: Nurse Practitioner

## 2013-03-30 VITALS — BP 108/68 | HR 102 | Temp 97.7°F

## 2013-03-30 DIAGNOSIS — Z01419 Encounter for gynecological examination (general) (routine) without abnormal findings: Secondary | ICD-10-CM

## 2013-03-30 DIAGNOSIS — Z0000 Encounter for general adult medical examination without abnormal findings: Secondary | ICD-10-CM

## 2013-03-30 DIAGNOSIS — Z124 Encounter for screening for malignant neoplasm of cervix: Secondary | ICD-10-CM

## 2013-03-30 DIAGNOSIS — F1020 Alcohol dependence, uncomplicated: Secondary | ICD-10-CM

## 2013-03-30 LAB — POCT CBC
Hemoglobin: 11.9 g/dL — AB (ref 12.2–16.2)
Lymph, poc: 2.8 (ref 0.6–3.4)
MCH, POC: 28 pg (ref 27–31.2)
MCV: 86.5 fL (ref 80–97)
RBC: 4.3 M/uL (ref 4.04–5.48)

## 2013-03-30 NOTE — Progress Notes (Signed)
Patient did not leave a urine 03-30-13

## 2013-03-30 NOTE — Patient Instructions (Signed)

## 2013-03-30 NOTE — Progress Notes (Signed)
Subjective:    Patient ID: Darlene Wilcox, female    DOB: 31-Dec-1960, 52 y.o.   MRN: [id number]  HPI  Patient in today for PAP- normally sees b.[place]- she is doing well without complaints- just recently had arthroscopy of left knee. Patient Active Problem List   Diagnosis Date Noted  . MANIC DEPRESSIVE ILLNESS 11/13/2006  . DISORDER, TOBACCO USE 11/13/2006  . GERD 11/13/2006  . BACK PAIN, CHRONIC 11/13/2006  . HX, PERSONAL, ALCOHOLISM 11/13/2006   Outpatient Encounter Prescriptions as of 03/30/2013  Medication Sig  . clonazePAM (KLONOPIN) 1 MG tablet Take 1 tablet (1 mg total) by mouth 4 (four) times daily.  Marland Kitchen FLUoxetine (PROZAC) 20 MG capsule   . HYDROcodone-acetaminophen (NORCO) 10-325 MG per tablet Take 1 tablet by mouth 3 (three) times daily.  Marland Kitchen ibuprofen (ADVIL,MOTRIN) 800 MG tablet Take 800 mg by mouth 3 (three) times daily with meals.  . IMIPRAMINE HCL PO Take 2 capsules by mouth at bedtime.   . meloxicam (MOBIC) 15 MG tablet   . NEXIUM 40 MG capsule   . oxcarbazepine (TRILEPTAL) 600 MG tablet Take 600 mg by mouth 2 (two) times daily.  Marland Kitchen oxyCODONE (OXY IR/ROXICODONE) 5 MG immediate release tablet   . oxyCODONE-acetaminophen (PERCOCET/ROXICET) 5-325 MG per tablet   . pregabalin (LYRICA) 75 MG capsule Take 150 mg by mouth daily.   Marland Kitchen PROAIR HFA 108 (90 BASE) MCG/ACT inhaler   . topiramate (TOPAMAX) 50 MG tablet Take 1 tablet (50 mg total) by mouth 2 (two) times daily.  . traZODone (DESYREL) 100 MG tablet Take 200 mg by mouth at bedtime.  . ziprasidone (GEODON) 80 MG capsule   . [DISCONTINUED] azithromycin (ZITHROMAX) 250 MG tablet Take 2 po first days and then one po qd x 4 days  . [DISCONTINUED] benzonatate (TESSALON) 200 MG capsule Take 1 capsule (200 mg total) by mouth 2 (two) times daily as needed for cough.  . [DISCONTINUED] fexofenadine (ALLEGRA) 180 MG tablet Take 180 mg by mouth daily.  . [DISCONTINUED] methocarbamol (ROBAXIN) 500 MG tablet Take 500 mg by mouth 2 (two)  times daily.   . [DISCONTINUED] naproxen (NAPROSYN) 500 MG tablet        Review of Systems  Constitutional: Negative.   HENT: Negative.   Respiratory: Negative.   Cardiovascular: Negative.   Gastrointestinal: Negative.   Genitourinary: Negative.   Musculoskeletal: Negative.   Neurological: Negative.   Hematological: Negative.   Psychiatric/Behavioral: Negative.        Objective:   Physical Exam  Constitutional: She is oriented to person, place, and time. She appears well-developed and well-nourished.  HENT:  Head: Normocephalic.  Right Ear: Hearing, tympanic membrane, external ear and ear canal normal.  Left Ear: Hearing, tympanic membrane, external ear and ear canal normal.  Nose: Nose normal.  Mouth/Throat: Uvula is midline and oropharynx is clear and moist.  Eyes: Conjunctivae and EOM are normal. Pupils are equal, round, and reactive to light.  Neck: Normal range of motion and full passive range of motion without pain. Neck supple. No JVD present. Carotid bruit is not present. No mass and no thyromegaly present.  Cardiovascular: Normal rate, normal heart sounds and intact distal pulses.   No murmur heard. Pulmonary/Chest: Effort normal and breath sounds normal. Right breast exhibits no inverted nipple, no mass, no nipple discharge, no skin change and no tenderness. Left breast exhibits no inverted nipple, no mass, no nipple discharge, no skin change and no tenderness.  Abdominal: Soft. Bowel sounds are normal. She  exhibits no mass. There is no tenderness.  Genitourinary: Vagina normal and uterus normal. No breast swelling, tenderness, discharge or bleeding.  bimanual exam-No adnexal masses or tenderness. Vaginal cuff intact  Musculoskeletal: Normal range of motion.  Lymphadenopathy:    She has no cervical adenopathy.  Neurological: She is alert and oriented to person, place, and time.  Skin: Skin is warm and dry.  Psychiatric: She has a normal mood and affect. Her  behavior is normal. Judgment and thought content normal.   BP 108/68  Pulse 102  Temp(Src) 97.7 F (36.5 C) (Oral)       Assessment & Plan:   1. Encounter for routine gynecological examination   2. Annual physical exam   3. Alcoholism    Orders Placed This Encounter  Procedures  . CMP14+EGFR  . NMR, lipoprofile  . Thyroid Panel With TSH  . POCT UA - Microscopic Only  . POCT urinalysis dipstick  . POCT CBC   No meds ordered today  Continue all meds Labs pending Diet and exercise encouraged Health maintenance reviewed Follow up in 6 months  Darlene Daphine Deutscher, FNP

## 2013-04-01 LAB — CMP14+EGFR
AST: 26 IU/L (ref 0–40)
Albumin/Globulin Ratio: 1.8 (ref 1.1–2.5)
Alkaline Phosphatase: 101 IU/L (ref 39–117)
BUN/Creatinine Ratio: 20 (ref 9–23)
Creatinine, Ser: 0.6 mg/dL (ref 0.57–1.00)
GFR calc Af Amer: 123 mL/min/{1.73_m2} (ref >59)
GFR calc non Af Amer: 107 mL/min/{1.73_m2} (ref >59)
Globulin, Total: 2 g/dL (ref 1.5–4.5)
Sodium: 141 mmol/L (ref 134–144)

## 2013-04-01 LAB — THYROID PANEL WITH TSH: TSH: 2.91 u[IU]/mL (ref 0.450–4.500)

## 2013-04-01 LAB — NMR, LIPOPROFILE
HDL Particle Number: 29.7 umol/L — ABNORMAL LOW (ref >=30.5)
LP-IR Score: 87 — ABNORMAL HIGH (ref <=45)
Small LDL Particle Number: 1760 nmol/L — ABNORMAL HIGH (ref <=527)

## 2013-04-02 LAB — PAP IG W/ RFLX HPV ASCU

## 2013-04-03 ENCOUNTER — Other Ambulatory Visit: Payer: Self-pay | Admitting: Family Medicine

## 2013-04-13 ENCOUNTER — Other Ambulatory Visit: Payer: Self-pay | Admitting: *Deleted

## 2013-04-13 MED ORDER — ESOMEPRAZOLE MAGNESIUM 40 MG PO CPDR: 40.0000 mg | DELAYED_RELEASE_CAPSULE | Freq: Every day | ORAL | Status: DC

## 2013-04-17 ENCOUNTER — Emergency Department (HOSPITAL_COMMUNITY): Payer: No Typology Code available for payment source

## 2013-04-17 ENCOUNTER — Encounter (HOSPITAL_COMMUNITY): Payer: Self-pay | Admitting: Emergency Medicine

## 2013-04-17 ENCOUNTER — Emergency Department (HOSPITAL_COMMUNITY)
Admission: EM | Admit: 2013-04-17 | Discharge: 2013-04-17 | Disposition: A | Discharge status: Home / Self Care | Payer: No Typology Code available for payment source | Attending: Emergency Medicine | Admitting: Emergency Medicine

## 2013-04-17 DIAGNOSIS — S161XXA Strain of muscle, fascia and tendon at neck level, initial encounter: Secondary | ICD-10-CM

## 2013-04-17 DIAGNOSIS — G8929 Other chronic pain: Secondary | ICD-10-CM | POA: Insufficient documentation

## 2013-04-17 DIAGNOSIS — K219 Gastro-esophageal reflux disease without esophagitis: Secondary | ICD-10-CM | POA: Diagnosis not present

## 2013-04-17 DIAGNOSIS — S139XXA Sprain of joints and ligaments of unspecified parts of neck, initial encounter: Secondary | ICD-10-CM | POA: Insufficient documentation

## 2013-04-17 DIAGNOSIS — Y92410 Unspecified street and highway as the place of occurrence of the external cause: Secondary | ICD-10-CM | POA: Insufficient documentation

## 2013-04-17 DIAGNOSIS — G40909 Epilepsy, unspecified, not intractable, without status epilepticus: Secondary | ICD-10-CM | POA: Insufficient documentation

## 2013-04-17 DIAGNOSIS — Y9389 Activity, other specified: Secondary | ICD-10-CM | POA: Insufficient documentation

## 2013-04-17 DIAGNOSIS — F411 Generalized anxiety disorder: Secondary | ICD-10-CM | POA: Insufficient documentation

## 2013-04-17 DIAGNOSIS — F319 Bipolar disorder, unspecified: Secondary | ICD-10-CM | POA: Insufficient documentation

## 2013-04-17 DIAGNOSIS — Z791 Long term (current) use of non-steroidal anti-inflammatories (NSAID): Secondary | ICD-10-CM | POA: Insufficient documentation

## 2013-04-17 DIAGNOSIS — IMO0002 Reserved for concepts with insufficient information to code with codable children: Secondary | ICD-10-CM | POA: Insufficient documentation

## 2013-04-17 DIAGNOSIS — S8000XA Contusion of unspecified knee, initial encounter: Secondary | ICD-10-CM | POA: Insufficient documentation

## 2013-04-17 DIAGNOSIS — Z79899 Other long term (current) drug therapy: Secondary | ICD-10-CM | POA: Insufficient documentation

## 2013-04-17 DIAGNOSIS — F17200 Nicotine dependence, unspecified, uncomplicated: Secondary | ICD-10-CM | POA: Diagnosis not present

## 2013-04-17 DIAGNOSIS — J45909 Unspecified asthma, uncomplicated: Secondary | ICD-10-CM | POA: Diagnosis not present

## 2013-04-17 DIAGNOSIS — Z9889 Other specified postprocedural states: Secondary | ICD-10-CM | POA: Insufficient documentation

## 2013-04-17 DIAGNOSIS — S0993XA Unspecified injury of face, initial encounter: Secondary | ICD-10-CM | POA: Diagnosis present

## 2013-04-17 DIAGNOSIS — Z880 Allergy status to penicillin: Secondary | ICD-10-CM | POA: Insufficient documentation

## 2013-04-17 DIAGNOSIS — S8002XA Contusion of left knee, initial encounter: Secondary | ICD-10-CM

## 2013-04-17 MED ORDER — CYCLOBENZAPRINE HCL 10 MG PO TABS: 10.0000 mg | ORAL_TABLET | Freq: Three times a day (TID) | ORAL | Status: DC | PRN

## 2013-04-17 MED ORDER — IBUPROFEN 800 MG PO TABS: 800.0000 mg | ORAL_TABLET | Freq: Three times a day (TID) | ORAL | Status: DC | PRN
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
## 2013-04-17 NOTE — ED Notes (Signed)
Patient transported to X-ray 
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
## 2013-04-17 NOTE — ED Notes (Signed)
Patient returned to room. 

## 2013-05-13 ENCOUNTER — Ambulatory Visit: Payer: Self-pay

## 2013-05-15 ENCOUNTER — Other Ambulatory Visit: Payer: Self-pay | Admitting: Family Medicine

## 2013-05-18 ENCOUNTER — Ambulatory Visit: Payer: Medicare Other | Admitting: Physical Therapy

## 2013-05-18 NOTE — Telephone Encounter (Signed)
Last seen 03/30/13  MMM 

## 2013-05-19 ENCOUNTER — Ambulatory Visit: Payer: Medicare Other | Admitting: Physical Therapy

## 2013-05-19 ENCOUNTER — Ambulatory Visit: Payer: Medicare Other | Attending: Orthopedic Surgery | Admitting: Physical Therapy

## 2013-05-19 DIAGNOSIS — IMO0001 Reserved for inherently not codable concepts without codable children: Secondary | ICD-10-CM | POA: Insufficient documentation

## 2013-05-19 DIAGNOSIS — R5381 Other malaise: Secondary | ICD-10-CM | POA: Insufficient documentation

## 2013-05-19 DIAGNOSIS — M25569 Pain in unspecified knee: Secondary | ICD-10-CM | POA: Insufficient documentation

## 2013-06-02 ENCOUNTER — Ambulatory Visit (INDEPENDENT_AMBULATORY_CARE_PROVIDER_SITE_OTHER): Payer: Medicare Other | Admitting: Family Medicine

## 2013-06-02 ENCOUNTER — Ambulatory Visit (INDEPENDENT_AMBULATORY_CARE_PROVIDER_SITE_OTHER): Payer: Medicare Other

## 2013-06-02 ENCOUNTER — Encounter (INDEPENDENT_AMBULATORY_CARE_PROVIDER_SITE_OTHER): Payer: Self-pay

## 2013-06-02 VITALS — BP 127/84 | HR 104 | Temp 97.0°F | Ht 62.0 in | Wt 198.8 lb

## 2013-06-02 DIAGNOSIS — R0602 Shortness of breath: Secondary | ICD-10-CM

## 2013-06-02 DIAGNOSIS — R609 Edema, unspecified: Secondary | ICD-10-CM

## 2013-06-02 DIAGNOSIS — J209 Acute bronchitis, unspecified: Secondary | ICD-10-CM

## 2013-06-02 LAB — POCT CBC
Granulocyte percent: 67.7 %G (ref 37–80)
HCT, POC: 40.9 % (ref 37.7–47.9)
Hemoglobin: 12.6 g/dL (ref 12.2–16.2)
Lymph, poc: 2.7 (ref 0.6–3.4)
MCH, POC: 26.8 pg — AB (ref 27–31.2)
MCHC: 30.9 g/dL — AB (ref 31.8–35.4)
MCV: 86.5 fL (ref 80–97)
MPV: 7.7 fL (ref 0–99.8)
POC Granulocyte: 6.7 (ref 2–6.9)
POC LYMPH PERCENT: 27.7 %L (ref 10–50)
Platelet Count, POC: 182 10*3/uL (ref 142–424)
RBC: 4.7 M/uL (ref 4.04–5.48)
RDW, POC: 13.7 %
WBC: 9.9 10*3/uL (ref 4.6–10.2)

## 2013-06-02 MED ORDER — METHYLPREDNISOLONE (PAK) 4 MG PO TABS: ORAL_TABLET | ORAL | Status: DC

## 2013-06-02 MED ORDER — LEVOFLOXACIN 500 MG PO TABS: 500.0000 mg | ORAL_TABLET | Freq: Every day | ORAL | Status: DC

## 2013-06-02 MED ORDER — FUROSEMIDE 40 MG PO TABS: 40.0000 mg | ORAL_TABLET | Freq: Every day | ORAL | Status: DC

## 2013-06-02 MED ORDER — ALBUTEROL SULFATE HFA 108 (90 BASE) MCG/ACT IN AERS: 1.0000 | INHALATION_SPRAY | Freq: Four times a day (QID) | RESPIRATORY_TRACT | Status: DC | PRN

## 2013-06-02 NOTE — Patient Instructions (Signed)
Edema Edema is an abnormal build-up of fluids in tissues. Because this is partly dependent on gravity (water flows to the lowest place), it is more common in the legs and thighs (lower extremities). It is also common in the looser tissues, like around the eyes. Painless swelling of the feet and ankles is common and increases as a person ages. It may affect both legs and may include the calves or even thighs. When squeezed, the fluid may move out of the affected area and may leave a dent for a few moments. CAUSES   Prolonged standing or sitting in one place for extended periods of time. Movement helps pump tissue fluid into the veins, and absence of movement prevents this, resulting in edema.  Varicose veins. The valves in the veins do not work as well as they should. This causes fluid to leak into the tissues.  Fluid and salt overload.  Injury, burn, or surgery to the leg, ankle, or foot, may damage veins and allow fluid to leak out.  Sunburn damages vessels. Leaky vessels allow fluid to go out into the sunburned tissues.  Allergies (from insect bites or stings, medications or chemicals) cause swelling by allowing vessels to become leaky.  Protein in the blood helps keep fluid in your vessels. Low protein, as in malnutrition, allows fluid to leak out.  Hormonal changes, including pregnancy and menstruation, cause fluid retention. This fluid may leak out of vessels and cause edema.  Medications that cause fluid retention. Examples are sex hormones, blood pressure medications, steroid treatment, or anti-depressants.  Some illnesses cause edema, especially heart failure, kidney disease, or liver disease.  Surgery that cuts veins or lymph nodes, such as surgery done for the heart or for breast cancer, may result in edema. DIAGNOSIS  Your caregiver is usually easily able to determine what is causing your swelling (edema) by simply asking what is wrong (getting a history) and examining you (doing  a physical). Sometimes x-rays, EKG (electrocardiogram or heart tracing), and blood work may be done to evaluate for underlying medical illness. TREATMENT  General treatment includes:  Leg elevation (or elevation of the affected body part).  Restriction of fluid intake.  Prevention of fluid overload.  Compression of the affected body part. Compression with elastic bandages or support stockings squeezes the tissues, preventing fluid from entering and forcing it back into the blood vessels.  Diuretics (also called water pills or fluid pills) pull fluid out of your body in the form of increased urination. These are effective in reducing the swelling, but can have side effects and must be used only under your caregiver's supervision. Diuretics are appropriate only for some types of edema. The specific treatment can be directed at any underlying causes discovered. Heart, liver, or kidney disease should be treated appropriately. HOME CARE INSTRUCTIONS   Elevate the legs (or affected body part) above the level of the heart, while lying down.  Avoid sitting or standing still for prolonged periods of time.  Avoid putting anything directly under the knees when lying down, and do not wear constricting clothing or garters on the upper legs.  Exercising the legs causes the fluid to work back into the veins and lymphatic channels. This may help the swelling go down.  The pressure applied by elastic bandages or support stockings can help reduce ankle swelling.  A low-salt diet may help reduce fluid retention and decrease the ankle swelling.  Take any medications exactly as prescribed. SEEK MEDICAL CARE IF:  Your edema is   not responding to recommended treatments. SEEK IMMEDIATE MEDICAL CARE IF:   You develop shortness of breath or chest pain.  You cannot breathe when you lay down; or if, while lying down, you have to get up and go to the window to get your breath.  You are having increasing  swelling without relief from treatment.  You develop a fever over 102 F (38.9 C).  You develop pain or redness in the areas that are swollen.  Tell your caregiver right away if you have gained 03 lb/1.4 kg in 1 day or 05 lb/2.3 kg in a week. MAKE SURE YOU:   Understand these instructions.  Will watch your condition.  Will get help right away if you are not doing well or get worse. Document Released: 05/14/2005 Document Revised: 11/13/2011 Document Reviewed: 12/31/2007 [employer] Patient Information 2014 [employer].  

## 2013-06-02 NOTE — Progress Notes (Signed)
   Subjective:    Patient ID: Darlene Wilcox, female    DOB: 26-Nov-1960, 53 y.o.   MRN: [id number]  HPI This 53 y.o. female presents for evaluation of swelling in abdomen and legs over the last 2 weeks. She is also having uri sx's and DOE.  She has smoking hx and she does use inhalers.  She Has been coughing a lot at night.  She has been sweating a lot at night.   Review of Systems    No chest pain, SOB, HA, dizziness, vision change, N/V, diarrhea, constipation, dysuria, urinary urgency or frequency, myalgias, arthralgias or rash.  Objective:   Physical Exam  Vital signs noted  Well developed well nourished female.  HEENT - Head atraumatic Normocephalic                Eyes - PERRLA, Conjuctiva - clear Sclera- Clear EOMI                Ears - EAC's Wnl TM's Wnl Gross Hearing WNL                Nose - Nares patent                 Throat - oropharanx wnl Respiratory - Lungs CTA bilateral Cardiac - RRR S1 and S2 without murmur GI - Abdomen soft Nontender and bowel sounds active x 4 Extremities - Bilateral Legs with 1 plus edema. Neuro - Grossly intact.  CXR - No infiltrates Prelimnary reading by Iverson Alamin    Assessment & Plan:  Edema - Plan: furosemide (LASIX) 40 MG tablet, methylPREDNIsolone (MEDROL DOSPACK) 4 MG tablet, CMP14+EGFR, DG Chest 2 View  Acute bronchitis - Plan: methylPREDNIsolone (MEDROL DOSPACK) 4 MG tablet, levofloxacin (LEVAQUIN) 500 MG tablet, POCT CBC, DG Chest 2 View  Shortness of breath - Plan: albuterol (PROAIR HFA) 108 (90 BASE) MCG/ACT inhaler, methylPREDNIsolone (MEDROL DOSPACK) 4 MG tablet, DG Chest 2 View.  Follow up in one week   Lysbeth Penner FNP

## 2013-06-03 LAB — CMP14+EGFR
ALT: 49 IU/L — ABNORMAL HIGH (ref 0–32)
AST: 33 IU/L (ref 0–40)
Albumin/Globulin Ratio: 1.8 (ref 1.1–2.5)
Albumin: 4.1 g/dL (ref 3.5–5.5)
Alkaline Phosphatase: 123 IU/L — ABNORMAL HIGH (ref 39–117)
BUN/Creatinine Ratio: 18 (ref 9–23)
BUN: 10 mg/dL (ref 6–24)
CO2: 24 mmol/L (ref 18–29)
Calcium: 9.4 mg/dL (ref 8.7–10.2)
Chloride: 99 mmol/L (ref 97–108)
Creatinine, Ser: 0.56 mg/dL — ABNORMAL LOW (ref 0.57–1.00)
GFR calc Af Amer: 125 mL/min/{1.73_m2} (ref >59)
GFR calc non Af Amer: 108 mL/min/{1.73_m2} (ref >59)
Globulin, Total: 2.3 g/dL (ref 1.5–4.5)
Glucose: 86 mg/dL (ref 65–99)
Potassium: 4 mmol/L (ref 3.5–5.2)
Sodium: 140 mmol/L (ref 134–144)
Total Bilirubin: 0.2 mg/dL (ref 0.0–1.2)
Total Protein: 6.4 g/dL (ref 6.0–8.5)

## 2013-06-04 ENCOUNTER — Telehealth: Payer: Self-pay | Admitting: Family Medicine

## 2013-06-04 ENCOUNTER — Encounter: Payer: Self-pay | Admitting: *Deleted

## 2013-06-04 NOTE — Telephone Encounter (Signed)
Message copied by Waverly Ferrari on Thu Jun 04, 2013  9:17 AM ------      Message from: Lysbeth Penner      Created: Thu Jun 04, 2013  8:21 AM       ALT is mildly elevated and recommend rechecking next visit ------

## 2013-06-05 NOTE — Telephone Encounter (Signed)
DONE

## 2013-06-08 ENCOUNTER — Other Ambulatory Visit: Payer: Self-pay | Admitting: Family Medicine

## 2013-06-08 ENCOUNTER — Ambulatory Visit: Payer: Medicare Other | Attending: Orthopedic Surgery | Admitting: Physical Therapy

## 2013-06-08 DIAGNOSIS — M25569 Pain in unspecified knee: Secondary | ICD-10-CM | POA: Insufficient documentation

## 2013-06-08 DIAGNOSIS — R5381 Other malaise: Secondary | ICD-10-CM | POA: Insufficient documentation

## 2013-06-08 DIAGNOSIS — IMO0001 Reserved for inherently not codable concepts without codable children: Secondary | ICD-10-CM | POA: Insufficient documentation

## 2013-06-15 ENCOUNTER — Ambulatory Visit: Payer: Medicare Other | Admitting: Physical Therapy

## 2013-06-17 ENCOUNTER — Other Ambulatory Visit: Payer: Self-pay | Admitting: Orthopaedic Surgery

## 2013-06-17 ENCOUNTER — Ambulatory Visit: Payer: Medicare Other | Admitting: Physical Therapy

## 2013-06-17 DIAGNOSIS — M545 Low back pain, unspecified: Secondary | ICD-10-CM

## 2013-06-20 ENCOUNTER — Ambulatory Visit
Admission: RE | Admit: 2013-06-20 | Discharge: 2013-06-20 | Disposition: A | Discharge status: Home / Self Care | Payer: Medicaid Other | Source: Ambulatory Visit | Attending: Orthopaedic Surgery | Admitting: Orthopaedic Surgery

## 2013-06-20 DIAGNOSIS — M545 Low back pain, unspecified: Secondary | ICD-10-CM

## 2013-06-23 ENCOUNTER — Encounter: Payer: Medicare Other | Admitting: Physical Therapy

## 2013-06-25 ENCOUNTER — Encounter: Payer: Medicare Other | Admitting: Physical Therapy

## 2013-07-05 ENCOUNTER — Other Ambulatory Visit: Payer: Self-pay | Admitting: Nurse Practitioner

## 2013-07-10 ENCOUNTER — Telehealth: Payer: Self-pay | Admitting: Family Medicine

## 2013-07-10 ENCOUNTER — Other Ambulatory Visit: Payer: Self-pay | Admitting: Family Medicine

## 2013-07-10 MED ORDER — ALBUTEROL SULFATE HFA 108 (90 BASE) MCG/ACT IN AERS: 2.0000 | INHALATION_SPRAY | Freq: Four times a day (QID) | RESPIRATORY_TRACT | Status: DC | PRN

## 2013-07-30 ENCOUNTER — Other Ambulatory Visit: Payer: Self-pay | Admitting: Nurse Practitioner

## 2013-09-04 ENCOUNTER — Telehealth: Payer: Self-pay | Admitting: Family Medicine

## 2013-09-04 NOTE — Telephone Encounter (Signed)
Appt given per patients request 

## 2013-09-10 ENCOUNTER — Ambulatory Visit: Payer: Medicare Other | Admitting: Family Medicine

## 2013-09-11 ENCOUNTER — Other Ambulatory Visit: Payer: Self-pay | Admitting: Family Medicine

## 2013-09-14 ENCOUNTER — Other Ambulatory Visit: Payer: Self-pay | Admitting: Family Medicine

## 2013-09-14 NOTE — Telephone Encounter (Signed)
Last ov 06/02/13

## 2013-09-19 ENCOUNTER — Other Ambulatory Visit: Payer: Self-pay | Admitting: Family Medicine

## 2013-09-28 ENCOUNTER — Other Ambulatory Visit: Payer: Self-pay | Admitting: Family Medicine

## 2013-09-30 ENCOUNTER — Other Ambulatory Visit: Payer: Self-pay | Admitting: *Deleted

## 2013-09-30 MED ORDER — IMIPRAMINE HCL 50 MG PO TABS: ORAL_TABLET | ORAL | Status: DC

## 2013-10-12 ENCOUNTER — Telehealth: Payer: Self-pay | Admitting: Family Medicine

## 2013-10-12 ENCOUNTER — Ambulatory Visit (INDEPENDENT_AMBULATORY_CARE_PROVIDER_SITE_OTHER): Payer: Medicare Other | Admitting: Family Medicine

## 2013-10-12 VITALS — BP 124/75 | HR 101 | Temp 98.0°F | Ht 62.0 in | Wt 205.0 lb

## 2013-10-12 DIAGNOSIS — R609 Edema, unspecified: Secondary | ICD-10-CM

## 2013-10-12 DIAGNOSIS — J069 Acute upper respiratory infection, unspecified: Secondary | ICD-10-CM

## 2013-10-12 DIAGNOSIS — N390 Urinary tract infection, site not specified: Secondary | ICD-10-CM

## 2013-10-12 MED ORDER — AZITHROMYCIN 250 MG PO TABS: ORAL_TABLET | ORAL | Status: DC

## 2013-10-12 MED ORDER — METOLAZONE 2.5 MG PO TABS
2.5000 mg | ORAL_TABLET | Freq: Every day | ORAL | Status: DC
Start: 2013-10-12 — End: 2018-06-24

## 2013-10-12 NOTE — Progress Notes (Signed)
   Subjective:    Patient ID: [name], female    DOB: [birth year], 53 y.o.   MRN: [id number]  HPI  This 53 y.o. female presents for evaluation of c/o rash on lower extremities, rash, and uri sx's.  Review of Systems    No chest pain, SOB, HA, dizziness, vision change, N/V, diarrhea, constipation, dysuria, urinary urgency or frequency, myalgias, arthralgias or rash.  Objective:   Physical Exam  Vital signs noted  Well developed well nourished female.  HEENT - Head atraumatic Normocephalic                Eyes - PERRLA, Conjuctiva - clear Sclera- Clear EOMI                Ears - EAC's Wnl TM's Wnl Gross Hearing WNL                 Throat - oropharanx wnl Respiratory - Lungs CTA bilateral Cardiac - RRR S1 and S2 without murmur GI - Abdomen soft Nontender and bowel sounds active x 4 Extremities - One plus pre-tibial and pedal edema Neuro - Grossly intact.      Assessment & Plan:   URI, acute - Plan: azithromycin (ZITHROMAX) 250 MG tablet  Edema - Plan: azithromycin (ZITHROMAX) 250 MG tablet, metolazone (ZAROXOLYN) 2.5 MG tablet rx for venous compression stockings

## 2013-10-12 NOTE — Telephone Encounter (Signed)
Bill spoke with [facility] to clarify RX of Zaraxolyn

## 2013-11-03 ENCOUNTER — Encounter: Payer: Self-pay | Admitting: Family Medicine

## 2013-11-03 ENCOUNTER — Ambulatory Visit (INDEPENDENT_AMBULATORY_CARE_PROVIDER_SITE_OTHER): Payer: Medicare Other | Admitting: Family Medicine

## 2013-11-03 VITALS — BP 124/78 | HR 108 | Temp 98.2°F | Ht 62.0 in | Wt 192.0 lb

## 2013-11-03 DIAGNOSIS — M1990 Unspecified osteoarthritis, unspecified site: Secondary | ICD-10-CM

## 2013-11-03 DIAGNOSIS — R609 Edema, unspecified: Secondary | ICD-10-CM

## 2013-11-03 DIAGNOSIS — R5381 Other malaise: Secondary | ICD-10-CM

## 2013-11-03 DIAGNOSIS — E785 Hyperlipidemia, unspecified: Secondary | ICD-10-CM

## 2013-11-03 DIAGNOSIS — M129 Arthropathy, unspecified: Secondary | ICD-10-CM

## 2013-11-03 DIAGNOSIS — R5383 Other fatigue: Secondary | ICD-10-CM

## 2013-11-03 LAB — POCT CBC
Granulocyte percent: 72.5 %G (ref 37–80)
HCT, POC: 41.4 % (ref 37.7–47.9)
Hemoglobin: 13.4 g/dL (ref 12.2–16.2)
Lymph, poc: 2.4 (ref 0.6–3.4)
MCH, POC: 28.8 pg (ref 27–31.2)
MCHC: 32.4 g/dL (ref 31.8–35.4)
MCV: 89 fL (ref 80–97)
MPV: 7.9 fL (ref 0–99.8)
POC Granulocyte: 7.3 — AB (ref 2–6.9)
POC LYMPH PERCENT: 24.1 %L (ref 10–50)
Platelet Count, POC: 248 10*3/uL (ref 142–424)
RBC: 4.7 M/uL (ref 4.04–5.48)
RDW, POC: 15.5 %
WBC: 10.1 10*3/uL (ref 4.6–10.2)

## 2013-11-03 MED ORDER — BECLOMETHASONE DIPROPIONATE 80 MCG/ACT IN AERS: 2.0000 | INHALATION_SPRAY | Freq: Two times a day (BID) | RESPIRATORY_TRACT | Status: DC

## 2013-11-03 MED ORDER — ESOMEPRAZOLE MAGNESIUM 40 MG PO CPDR: 40.0000 mg | DELAYED_RELEASE_CAPSULE | Freq: Every day | ORAL | Status: DC

## 2013-11-03 MED ORDER — ALBUTEROL SULFATE HFA 108 (90 BASE) MCG/ACT IN AERS: 2.0000 | INHALATION_SPRAY | RESPIRATORY_TRACT | Status: DC | PRN

## 2013-11-03 NOTE — Progress Notes (Signed)
   Subjective:    Patient ID: [name], female    DOB: Sep 18, 1960, 53 y.o.   MRN: [id number]  HPI This 53 y.o. female presents for evaluation of routine health check.  She is not diabetic. She Does exercise on occasion.  She has hx of MO. She has been walking and has lost 10 pounds recently.  She has hx of psychiatric illness, asthma, OA, and psychiatric illness.   Review of Systems No chest pain, SOB, HA, dizziness, vision change, N/V, diarrhea, constipation, dysuria, urinary urgency or frequency, myalgias, arthralgias or rash.     Objective:   Physical Exam  Vital signs noted  Well developed well nourished female.  HEENT - Head atraumatic Normocephalic                Eyes - PERRLA, Conjuctiva - clear Sclera- Clear EOMI                Ears - EAC's Wnl TM's Wnl Gross Hearing WNL                Nose - Nares patent                 Throat - oropharanx wnl Respiratory - Lungs CTA bilateral Cardiac - RRR S1 and S2 without murmur GI - Abdomen soft Nontender and bowel sounds active x 4 Extremities - No edema. Neuro - Grossly intact.      Assessment & Plan:  Edema - Plan: POCT CBC, CMP14+EGFR, Lipid panel, TSH  Other malaise and fatigue - Plan: POCT CBC, CMP14+EGFR, Lipid panel, TSH  Arthritis - Plan: POCT CBC, CMP14+EGFR, Lipid panel, TSH  Other and unspecified hyperlipidemia - Plan: POCT CBC, CMP14+EGFR, Lipid panel, TSH  Lysbeth Penner FNP

## 2013-11-04 ENCOUNTER — Other Ambulatory Visit: Payer: Self-pay | Admitting: Family Medicine

## 2013-11-04 DIAGNOSIS — E781 Pure hyperglyceridemia: Secondary | ICD-10-CM

## 2013-11-04 LAB — CMP14+EGFR
ALT: 19 IU/L (ref 0–32)
AST: 20 IU/L (ref 0–40)
Albumin/Globulin Ratio: 1.6 (ref 1.1–2.5)
Albumin: 4.2 g/dL (ref 3.5–5.5)
Alkaline Phosphatase: 154 IU/L — ABNORMAL HIGH (ref 39–117)
BUN/Creatinine Ratio: 18 (ref 9–23)
BUN: 12 mg/dL (ref 6–24)
CO2: 23 mmol/L (ref 18–29)
Calcium: 8.9 mg/dL (ref 8.7–10.2)
Chloride: 104 mmol/L (ref 97–108)
Creatinine, Ser: 0.65 mg/dL (ref 0.57–1.00)
GFR calc Af Amer: 119 mL/min/{1.73_m2} (ref >59)
GFR calc non Af Amer: 103 mL/min/{1.73_m2} (ref >59)
Globulin, Total: 2.6 g/dL (ref 1.5–4.5)
Glucose: 108 mg/dL — ABNORMAL HIGH (ref 65–99)
Potassium: 3.7 mmol/L (ref 3.5–5.2)
Sodium: 142 mmol/L (ref 134–144)
Total Bilirubin: <0.2 mg/dL (ref 0.0–1.2)
Total Protein: 6.8 g/dL (ref 6.0–8.5)

## 2013-11-04 LAB — LIPID PANEL
Chol/HDL Ratio: 8.5 ratio units — ABNORMAL HIGH (ref 0.0–4.4)
Cholesterol, Total: 220 mg/dL — ABNORMAL HIGH (ref 100–199)
HDL: 26 mg/dL — ABNORMAL LOW (ref >39)
Triglycerides: 583 mg/dL (ref 0–149)

## 2013-11-04 LAB — TSH: TSH: 1.21 u[IU]/mL (ref 0.450–4.500)

## 2013-11-04 MED ORDER — GEMFIBROZIL 600 MG PO TABS: ORAL_TABLET | ORAL | Status: DC

## 2013-11-05 ENCOUNTER — Telehealth: Payer: Self-pay | Admitting: Family Medicine

## 2013-11-05 NOTE — Telephone Encounter (Signed)
Message copied by Waverly Ferrari on Thu Nov 05, 2013  2:50 PM ------      Message from: Lysbeth Penner      Created: Wed Nov 04, 2013  2:25 PM       Trigs elevated and start lopid one po qd and check lft and flp in 6 weeks. ------

## 2013-11-12 ENCOUNTER — Other Ambulatory Visit: Payer: Self-pay | Admitting: Family Medicine

## 2013-11-13 NOTE — Telephone Encounter (Signed)
Message copied by Cline Crock on Fri Nov 13, 2013  9:08 AM ------      Message from: Lysbeth Penner      Created: Wed Nov 04, 2013  2:25 PM       Trigs elevated and start lopid one po qd and check lft and flp in 6 weeks. ------

## 2013-11-21 ENCOUNTER — Other Ambulatory Visit: Payer: Self-pay | Admitting: Nurse Practitioner

## 2013-11-21 ENCOUNTER — Encounter: Payer: Self-pay | Admitting: *Deleted

## 2013-11-23 ENCOUNTER — Ambulatory Visit (INDEPENDENT_AMBULATORY_CARE_PROVIDER_SITE_OTHER): Payer: Medicare Other | Admitting: Family Medicine

## 2013-11-23 VITALS — BP 102/74 | HR 94 | Temp 98.0°F | Ht 62.0 in | Wt 202.0 lb

## 2013-11-23 DIAGNOSIS — N61 Mastitis without abscess: Secondary | ICD-10-CM

## 2013-11-23 MED ORDER — CLINDAMYCIN HCL 300 MG PO CAPS: 300.0000 mg | ORAL_CAPSULE | Freq: Three times a day (TID) | ORAL | Status: DC

## 2013-11-23 NOTE — Progress Notes (Signed)
   Subjective:    Patient ID: Darlene Wilcox, female    DOB: [birth year], 53 y.o.   MRN: [id number]  HPI This 54 y.o. female presents for evaluation of left breast pain for 2 weeks.   Review of Systems    No chest pain, SOB, HA, dizziness, vision change, N/V, diarrhea, constipation, dysuria, urinary urgency or frequency, myalgias, arthralgias or rash.  Objective:   Physical Exam  Vital signs noted  Well developed well nourished female.  HEENT - Head atraumatic Normocephalic                Eyes - PERRLA, Conjuctiva - clear Sclera- Clear EOMI                Ears - EAC's Wnl TM's Wnl Gross Hearing WNL                Throat - oropharanx wnl Respiratory - Lungs CTA bilateral Cardiac - RRR S1 and S2 without murmur Breast - Left breast with tenderness in medial breast area and Left >right breast no masses       Assessment & Plan:  Mastitis - Plan: clindamycin (CLEOCIN) 300 MG capsule, DISCONTINUED: clindamycin (CLEOCIN) 300 MG capsule Follow up prn if not better.  Lysbeth Penner FNP

## 2013-11-24 ENCOUNTER — Telehealth: Payer: Self-pay | Admitting: Family Medicine

## 2013-11-24 NOTE — Telephone Encounter (Signed)
Pt notified that Qvar inhaler 80mg  was sent into [facility] Verbalizes understanding

## 2013-12-04 ENCOUNTER — Other Ambulatory Visit: Payer: Self-pay | Admitting: Family Medicine

## 2013-12-04 ENCOUNTER — Telehealth: Payer: Self-pay | Admitting: Family Medicine

## 2013-12-04 DIAGNOSIS — N61 Mastitis without abscess: Secondary | ICD-10-CM

## 2013-12-04 MED ORDER — CLINDAMYCIN HCL 300 MG PO CAPS: 300.0000 mg | ORAL_CAPSULE | Freq: Three times a day (TID) | ORAL | Status: DC

## 2013-12-04 NOTE — Telephone Encounter (Signed)
Sent clindamycin rx for breast pain and follow up for edema and rash c/o

## 2013-12-11 ENCOUNTER — Telehealth: Payer: Self-pay | Admitting: Family Medicine

## 2013-12-14 ENCOUNTER — Ambulatory Visit (INDEPENDENT_AMBULATORY_CARE_PROVIDER_SITE_OTHER): Payer: Medicare Other | Admitting: Family Medicine

## 2013-12-14 ENCOUNTER — Encounter: Payer: Self-pay | Admitting: Family Medicine

## 2013-12-14 ENCOUNTER — Other Ambulatory Visit: Payer: Self-pay | Admitting: Family Medicine

## 2013-12-14 ENCOUNTER — Other Ambulatory Visit: Payer: Self-pay | Admitting: Orthopaedic Surgery

## 2013-12-14 VITALS — BP 99/68 | HR 98 | Temp 97.7°F | Ht 62.0 in | Wt 193.0 lb

## 2013-12-14 DIAGNOSIS — M545 Low back pain, unspecified: Secondary | ICD-10-CM

## 2013-12-14 DIAGNOSIS — I872 Venous insufficiency (chronic) (peripheral): Secondary | ICD-10-CM

## 2013-12-14 DIAGNOSIS — K2100 Gastro-esophageal reflux disease with esophagitis, without bleeding: Secondary | ICD-10-CM

## 2013-12-14 DIAGNOSIS — I8310 Varicose veins of unspecified lower extremity with inflammation: Secondary | ICD-10-CM

## 2013-12-14 DIAGNOSIS — J069 Acute upper respiratory infection, unspecified: Secondary | ICD-10-CM

## 2013-12-14 MED ORDER — ESOMEPRAZOLE MAGNESIUM 40 MG PO CPDR: 40.0000 mg | DELAYED_RELEASE_CAPSULE | Freq: Two times a day (BID) | ORAL | Status: DC

## 2013-12-14 MED ORDER — AZITHROMYCIN 250 MG PO TABS: ORAL_TABLET | ORAL | Status: DC

## 2013-12-14 MED ORDER — NYSTATIN-TRIAMCINOLONE 100000-0.1 UNIT/GM-% EX OINT: 1.0000 "application " | TOPICAL_OINTMENT | Freq: Two times a day (BID) | CUTANEOUS | Status: DC

## 2013-12-14 NOTE — Progress Notes (Signed)
   Subjective:    Patient ID: Darlene Wilcox, female    DOB: [birth year], 53 y.o.   MRN: [id number]  HPI This 53 y.o. female presents for evaluation of swelling in legs, rash in legs, cough, and GERD sx's. She c/o difficulty with swallowing on occasion.   Review of Systems C/o dysphasia, gerd, rash No chest pain, SOB, HA, dizziness, vision change, N/V, diarrhea, constipation, dysuria, urinary urgency or frequency, myalgias, arthralgias or rash.     Objective:   Physical Exam Vital signs noted  Well developed well nourished female.  HEENT - Head atraumatic Normocephalic                Eyes - PERRLA, Conjuctiva - clear Sclera- Clear EOMI                Ears - EAC's Wnl TM's Wnl Gross Hearing WNL                Throat - oropharanx wnl Respiratory - Lungs CTA bilateral Cardiac - RRR S1 and S2 without murmur GI - Abdomen soft Nontender and bowel sounds active x 4 Extremities - Generalized edema and mild rash on bilateral legs      Assessment & Plan:  Venous stasis dermatitis of both lower extremities - Plan: nystatin-triamcinolone ointment (MYCOLOG)  Gastroesophageal reflux disease with esophagitis - Plan: esomeprazole (NEXIUM) 40 MG capsule  URI, acute - Plan: azithromycin (ZITHROMAX) 250 MG tablet  Tobacco abuse - DC smoking  Lysbeth Penner FNP

## 2013-12-14 NOTE — Telephone Encounter (Signed)
Follow up p

## 2013-12-14 NOTE — Telephone Encounter (Signed)
Patient aware appointment made 

## 2013-12-17 ENCOUNTER — Other Ambulatory Visit: Payer: Self-pay | Admitting: Orthopaedic Surgery

## 2013-12-17 DIAGNOSIS — M542 Cervicalgia: Secondary | ICD-10-CM

## 2013-12-17 DIAGNOSIS — M545 Low back pain, unspecified: Secondary | ICD-10-CM

## 2013-12-22 ENCOUNTER — Other Ambulatory Visit: Payer: Medicare Other

## 2013-12-25 ENCOUNTER — Ambulatory Visit
Admission: RE | Admit: 2013-12-25 | Discharge: 2013-12-25 | Disposition: A | Discharge status: Home / Self Care | Payer: PRIVATE HEALTH INSURANCE | Source: Ambulatory Visit | Attending: Orthopaedic Surgery | Admitting: Orthopaedic Surgery

## 2013-12-25 DIAGNOSIS — M545 Low back pain, unspecified: Secondary | ICD-10-CM

## 2013-12-25 DIAGNOSIS — M542 Cervicalgia: Secondary | ICD-10-CM

## 2014-01-14 ENCOUNTER — Other Ambulatory Visit: Payer: Self-pay | Admitting: Family Medicine

## 2014-01-14 ENCOUNTER — Other Ambulatory Visit: Payer: Self-pay | Admitting: Nurse Practitioner

## 2014-01-18 ENCOUNTER — Encounter: Payer: Self-pay | Admitting: Family Medicine

## 2014-01-18 ENCOUNTER — Ambulatory Visit (INDEPENDENT_AMBULATORY_CARE_PROVIDER_SITE_OTHER): Payer: Medicare Other | Admitting: Family Medicine

## 2014-01-18 VITALS — BP 128/80 | HR 105 | Temp 98.3°F | Ht 62.0 in | Wt 194.6 lb

## 2014-01-18 DIAGNOSIS — R4789 Other speech disturbances: Secondary | ICD-10-CM

## 2014-01-18 DIAGNOSIS — R4702 Dysphasia: Secondary | ICD-10-CM

## 2014-01-18 DIAGNOSIS — K2100 Gastro-esophageal reflux disease with esophagitis, without bleeding: Secondary | ICD-10-CM

## 2014-01-18 DIAGNOSIS — R569 Unspecified convulsions: Secondary | ICD-10-CM

## 2014-01-18 LAB — POCT UA - MICROSCOPIC ONLY
Bacteria, U Microscopic: NEGATIVE
Casts, Ur, LPF, POC: NEGATIVE
Crystals, Ur, HPF, POC: NEGATIVE
Mucus, UA: NEGATIVE
RBC, urine, microscopic: NEGATIVE
WBC, Ur, HPF, POC: NEGATIVE
Yeast, UA: NEGATIVE

## 2014-01-18 LAB — POCT URINALYSIS DIPSTICK
Bilirubin, UA: NEGATIVE
Blood, UA: NEGATIVE
Glucose, UA: NEGATIVE
Ketones, UA: NEGATIVE
Leukocytes, UA: NEGATIVE
Nitrite, UA: NEGATIVE
Protein, UA: NEGATIVE
Spec Grav, UA: 1.01
Urobilinogen, UA: NEGATIVE
pH, UA: 7.5

## 2014-01-18 LAB — POCT CBC
Granulocyte percent: 82.5 %G — AB (ref 37–80)
HCT, POC: 37.6 % — AB (ref 37.7–47.9)
Hemoglobin: 12.4 g/dL (ref 12.2–16.2)
Lymph, poc: 1.5 (ref 0.6–3.4)
MCH, POC: 29.3 pg (ref 27–31.2)
MCHC: 32.9 g/dL (ref 31.8–35.4)
MCV: 89.2 fL (ref 80–97)
MPV: 7.5 fL (ref 0–99.8)
POC Granulocyte: 8.7 — AB (ref 2–6.9)
POC LYMPH PERCENT: 14.6 %L (ref 10–50)
Platelet Count, POC: 212 10*3/uL (ref 142–424)
RBC: 4.2 M/uL (ref 4.04–5.48)
RDW, POC: 14.3 %
WBC: 10.5 10*3/uL — AB (ref 4.6–10.2)

## 2014-01-18 NOTE — Addendum Note (Signed)
Addended by: Lysbeth Penner on: 01/18/2014 04:04 PM   Modules accepted: Orders

## 2014-01-18 NOTE — Progress Notes (Signed)
   Subjective:    Patient ID: Darlene Wilcox, female    DOB: February 07, 1961, 53 y.o.   MRN: [id number]  HPI C/o sz activity clonic tonic activity according to patient 4 days ago.  Her friend states she has Some absence sz activity.  She has hx of sz activity and she is on sz medicine tegretol  Tid.  She is not seeing neurology.   Review of Systems    No chest pain, SOB, HA, dizziness, vision change, N/V, diarrhea, constipation, dysuria, urinary urgency or frequency, myalgias, arthralgias or rash.  Objective:   Physical Exam  Vital signs noted  Well developed well nourished obese female.  HEENT - Head atraumatic Normocephalic                Eyes - PERRLA, Conjuctiva - clear Sclera- Clear EOMI                Ears - EAC's Wnl TM's Wnl Gross Hearing WNL Respiratory - Lungs CTA bilateral Cardiac - RRR S1 and S2 without murmur GI - Abdomen soft Nontender and bowel sounds active x 4 Extremities - No edema     Assessment & Plan:  Seizures - Plan: Carbamazepine level, total, Carbamazepine level, free, Ambulatory referral to Neurology, POCT UA - Microscopic Only, POCT urinalysis dipstick, POCT CBC, CMP14+EGFR, Thyroid Panel With TSH  Lysbeth Penner FNP

## 2014-01-18 NOTE — Progress Notes (Signed)
   Subjective:    Patient ID: Darlene Wilcox, female    DOB: 1961-03-31, 53 y.o.   MRN: [id number]  HPI  C/o swallowing difficulty and has been on PPI and h2 and still having sx's  Review of Systems     Objective:   Physical Exam        Assessment & Plan:  Seizures - Plan: Carbamazepine level, total, Carbamazepine level, free, Ambulatory referral to Neurology, POCT UA - Microscopic Only, POCT urinalysis dipstick, POCT CBC, CMP14+EGFR, Thyroid Panel With TSH  Dysphasia - Plan: Ambulatory referral to Gastroenterology  Gastroesophageal reflux disease with esophagitis - Plan: Ambulatory referral to Gastroenterology Lysbeth Penner FNP

## 2014-01-19 ENCOUNTER — Telehealth: Payer: Self-pay | Admitting: Family Medicine

## 2014-01-19 LAB — CMP14+EGFR
ALT: 16 IU/L (ref 0–32)
AST: 21 IU/L (ref 0–40)
Albumin/Globulin Ratio: 1.6 (ref 1.1–2.5)
Albumin: 4.2 g/dL (ref 3.5–5.5)
Alkaline Phosphatase: 153 IU/L — ABNORMAL HIGH (ref 39–117)
BUN/Creatinine Ratio: 19 (ref 9–23)
BUN: 12 mg/dL (ref 6–24)
CO2: 23 mmol/L (ref 18–29)
Calcium: 9.3 mg/dL (ref 8.7–10.2)
Chloride: 102 mmol/L (ref 97–108)
Creatinine, Ser: 0.64 mg/dL (ref 0.57–1.00)
GFR calc Af Amer: 119 mL/min/{1.73_m2} (ref >59)
GFR calc non Af Amer: 104 mL/min/{1.73_m2} (ref >59)
Globulin, Total: 2.6 g/dL (ref 1.5–4.5)
Glucose: 90 mg/dL (ref 65–99)
Potassium: 3.9 mmol/L (ref 3.5–5.2)
Sodium: 140 mmol/L (ref 134–144)
Total Bilirubin: 0.2 mg/dL (ref 0.0–1.2)
Total Protein: 6.8 g/dL (ref 6.0–8.5)

## 2014-01-19 LAB — THYROID PANEL WITH TSH
Free Thyroxine Index: 1.2 (ref 1.2–4.9)
T3 Uptake Ratio: 27 % (ref 24–39)
T4, Total: 4.3 ug/dL — ABNORMAL LOW (ref 4.5–12.0)
TSH: 0.842 u[IU]/mL (ref 0.450–4.500)

## 2014-01-19 LAB — CARBAMAZEPINE LEVEL, TOTAL: Carbamazepine Lvl: 5.4 ug/mL (ref 4.0–12.0)

## 2014-01-19 LAB — CARBAMAZEPINE, FREE AND TOTAL: Carbamazepine, Free: 1.3 ug/mL (ref 0.6–4.2)

## 2014-01-19 NOTE — Telephone Encounter (Signed)
Hands have been jerking and had seizure last Thursday. Pt is waiting on neuro ref but hands are jerking more today.

## 2014-01-21 ENCOUNTER — Emergency Department (HOSPITAL_COMMUNITY): Payer: PRIVATE HEALTH INSURANCE

## 2014-01-21 ENCOUNTER — Other Ambulatory Visit: Payer: Self-pay | Admitting: Family Medicine

## 2014-01-21 ENCOUNTER — Encounter (HOSPITAL_COMMUNITY): Payer: Self-pay | Admitting: Emergency Medicine

## 2014-01-21 ENCOUNTER — Emergency Department (HOSPITAL_COMMUNITY)
Admission: EM | Admit: 2014-01-21 | Discharge: 2014-01-21 | Disposition: A | Discharge status: Home / Self Care | Payer: PRIVATE HEALTH INSURANCE | Attending: Emergency Medicine | Admitting: Emergency Medicine

## 2014-01-21 DIAGNOSIS — G40909 Epilepsy, unspecified, not intractable, without status epilepticus: Secondary | ICD-10-CM | POA: Diagnosis not present

## 2014-01-21 DIAGNOSIS — Z880 Allergy status to penicillin: Secondary | ICD-10-CM | POA: Insufficient documentation

## 2014-01-21 DIAGNOSIS — F3289 Other specified depressive episodes: Secondary | ICD-10-CM | POA: Diagnosis not present

## 2014-01-21 DIAGNOSIS — IMO0002 Reserved for concepts with insufficient information to code with codable children: Secondary | ICD-10-CM | POA: Insufficient documentation

## 2014-01-21 DIAGNOSIS — Z79899 Other long term (current) drug therapy: Secondary | ICD-10-CM | POA: Diagnosis not present

## 2014-01-21 DIAGNOSIS — F411 Generalized anxiety disorder: Secondary | ICD-10-CM | POA: Insufficient documentation

## 2014-01-21 DIAGNOSIS — F17200 Nicotine dependence, unspecified, uncomplicated: Secondary | ICD-10-CM | POA: Insufficient documentation

## 2014-01-21 DIAGNOSIS — F329 Major depressive disorder, single episode, unspecified: Secondary | ICD-10-CM | POA: Diagnosis not present

## 2014-01-21 DIAGNOSIS — J45909 Unspecified asthma, uncomplicated: Secondary | ICD-10-CM | POA: Insufficient documentation

## 2014-01-21 DIAGNOSIS — K219 Gastro-esophageal reflux disease without esophagitis: Secondary | ICD-10-CM | POA: Insufficient documentation

## 2014-01-21 DIAGNOSIS — G8929 Other chronic pain: Secondary | ICD-10-CM | POA: Diagnosis not present

## 2014-01-21 DIAGNOSIS — F319 Bipolar disorder, unspecified: Secondary | ICD-10-CM | POA: Diagnosis not present

## 2014-01-21 DIAGNOSIS — R259 Unspecified abnormal involuntary movements: Secondary | ICD-10-CM | POA: Diagnosis not present

## 2014-01-21 DIAGNOSIS — R251 Tremor, unspecified: Secondary | ICD-10-CM

## 2014-01-21 LAB — CBC WITH DIFFERENTIAL/PLATELET
Basophils Absolute: 0 10*3/uL (ref 0.0–0.1)
Basophils Relative: 1 % (ref 0–1)
EOS PCT: 3 % (ref 0–5)
Eosinophils Absolute: 0.2 10*3/uL (ref 0.0–0.7)
HEMATOCRIT: 37.7 % (ref 36.0–46.0)
HEMOGLOBIN: 12.8 g/dL (ref 12.0–15.0)
LYMPHS PCT: 27 % (ref 12–46)
Lymphs Abs: 2 10*3/uL (ref 0.7–4.0)
MCH: 30.3 pg (ref 26.0–34.0)
MCHC: 34 g/dL (ref 30.0–36.0)
MCV: 89.1 fL (ref 78.0–100.0)
MONO ABS: 0.5 10*3/uL (ref 0.1–1.0)
MONOS PCT: 6 % (ref 3–12)
NEUTROS ABS: 4.7 10*3/uL (ref 1.7–7.7)
Neutrophils Relative %: 63 % (ref 43–77)
Platelets: 211 10*3/uL (ref 150–400)
RBC: 4.23 MIL/uL (ref 3.87–5.11)
RDW: 14.1 % (ref 11.5–15.5)
WBC: 7.3 10*3/uL (ref 4.0–10.5)

## 2014-01-21 LAB — URINALYSIS, ROUTINE W REFLEX MICROSCOPIC
BILIRUBIN URINE: NEGATIVE
GLUCOSE, UA: NEGATIVE mg/dL
HGB URINE DIPSTICK: NEGATIVE
Ketones, ur: NEGATIVE mg/dL
Leukocytes, UA: NEGATIVE
NITRITE: NEGATIVE
PH: 6 (ref 5.0–8.0)
Protein, ur: NEGATIVE mg/dL
SPECIFIC GRAVITY, URINE: 1.02 (ref 1.005–1.030)
Urobilinogen, UA: 0.2 mg/dL (ref 0.0–1.0)

## 2014-01-21 LAB — COMPREHENSIVE METABOLIC PANEL
ALBUMIN: 3.8 g/dL (ref 3.5–5.2)
ALK PHOS: 155 U/L — AB (ref 39–117)
ALT: 16 U/L (ref 0–35)
AST: 19 U/L (ref 0–37)
Anion gap: 11 (ref 5–15)
BUN: 17 mg/dL (ref 6–23)
CO2: 27 mEq/L (ref 19–32)
Calcium: 9.6 mg/dL (ref 8.4–10.5)
Chloride: 101 mEq/L (ref 96–112)
Creatinine, Ser: 0.73 mg/dL (ref 0.50–1.10)
GFR calc Af Amer: >90 mL/min (ref >90)
GFR calc non Af Amer: >90 mL/min (ref >90)
Glucose, Bld: 142 mg/dL — ABNORMAL HIGH (ref 70–99)
POTASSIUM: 4.3 meq/L (ref 3.7–5.3)
SODIUM: 139 meq/L (ref 137–147)
Total Bilirubin: 0.2 mg/dL — ABNORMAL LOW (ref 0.3–1.2)
Total Protein: 7.5 g/dL (ref 6.0–8.3)

## 2014-01-21 LAB — RAPID URINE DRUG SCREEN, HOSP PERFORMED
Amphetamines: NOT DETECTED
BARBITURATES: NOT DETECTED
BENZODIAZEPINES: POSITIVE — AB
COCAINE: NOT DETECTED
Opiates: POSITIVE — AB
TETRAHYDROCANNABINOL: NOT DETECTED

## 2014-01-21 NOTE — Telephone Encounter (Signed)
Doesn't sound like sz's but probably shoud go to ED if worried about having sz's, a consult for neurology has been put in.

## 2014-01-21 NOTE — ED Notes (Signed)
Pt states she has been shaking for several months, the pt states the shaking is making her have stuttered speech. Pts PCP instructed her to come to the ER for further evaluation.

## 2014-01-21 NOTE — Telephone Encounter (Signed)
Pt notified of recommendations Verbalizes understanding

## 2014-01-21 NOTE — ED Provider Notes (Addendum)
This chart was scribed for [name], Wilcox by Terressa Koyanagi, ED Scribe. This patient was seen in room APA16A/APA16A and the patient's care was started at 6:51 PM.  TIME SEEN: 6:51 PM  CHIEF COMPLAINT:  Chief Complaint  Patient presents with  . Tremors   HPI: PCP: Stevan Born, MD HPI Comments: Darlene Wilcox is a 53 y.o. female, with an extensive medical Hx noted below and significant for seizures (for which she takes Tegretol and with last seizure activity taking place on 01/14/14), psychiatric illness, GERD, asthma, tobacco use (0.5 ppd), and chronic back pain, who presents to the Emergency Department complaining of full body tremors (including upper and lower extremities) onset 4 months ago. She describes it as a "jerking". Pt further reports that her tongue has been numb in both sides and both her feet have been tingling. Pt states she has been "jerking all day today." Pt reports seeing her PCP for the same and is waiting for a neurology referral. Pt states her PCP directed her to come to the ED today for further evaluation. Pt denies loss of bladder control or bowel function. No urinary retention. No fever. No headache. No head injury. No family history of neurologic disorder including multiple sclerosis.   Family Hx:  ROS: See HPI Constitutional: no fever  Eyes: no drainage  ENT: no runny nose   Cardiovascular:  no chest pain  Resp: no SOB  GI: no vomiting GU: no dysuria Integumentary: no rash  Allergy: no hives  Musculoskeletal: no leg swelling  Neurological: no slurred speech ROS otherwise negative  PAST MEDICAL HISTORY/PAST SURGICAL HISTORY:  Past Medical History  Diagnosis Date  . Anxiety   . Seizures     states "silent seizures"; is on anticonvulsant; none in 2-3 mos.  . Headache(784.0)     1-2 x/month  . Asthma     daily inhaler  . GERD (gastroesophageal reflux disease)   . Full dentures   . Bipolar disorder   . Depression   . Chronic back pain greater  than 3 months duration   . 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 History of ankle sprain     right; 2 weeks ago;  c/o severe pain    MEDICATIONS:  Prior to Admission medications   Medication Sig Start Date End Date Taking? Authorizing Provider  albuterol (PROAIR HFA) 108 (90 BASE) MCG/ACT inhaler Inhale 2 puffs into the lungs every 4 (four) hours as needed for wheezing or shortness of breath. 11/03/13  Yes Lysbeth Penner, FNP  beclomethasone (QVAR) 80 MCG/ACT inhaler Inhale 2 puffs into the lungs 2 (two) times daily. 11/03/13  Yes Lysbeth Penner, FNP  carbamazepine (TEGRETOL) 200 MG tablet Take 200 mg by mouth 3 (three) times daily.   Yes Historical Provider, MD  clonazePAM (KLONOPIN) 1 MG tablet Take 1 mg by mouth 3 (three) times daily. 09/15/12  Yes Vernie Shanks, MD  esomeprazole (NEXIUM) 40 MG capsule Take 1 capsule (40 mg total) by mouth 2 (two) times daily before a meal. 12/14/13  Yes Lysbeth Penner, FNP  FLUoxetine (PROZAC) 20 MG capsule Take 60 mg by mouth daily.  03/29/13  Yes Historical Provider, MD  furosemide (LASIX) 40 MG tablet TAKE 1 TABLET (40 MG TOTAL) BY MOUTH DAILY. 01/15/14  Yes Lysbeth Penner, FNP  HYDROcodone-acetaminophen (NORCO) 10-325 MG per tablet Take 1 tablet by mouth every 8 (eight) hours as needed for moderate pain.    Yes Historical Provider,  MD  imipramine (TOFRANIL) 50 MG tablet TAKE 2 TABLETS BY MOUTH AT BEDTIME 01/15/14  Yes Lysbeth Penner, FNP  methocarbamol (ROBAXIN) 500 MG tablet Take 500 mg by mouth 3 (three) times daily.    Yes Historical Provider, MD  Multiple Vitamin (MULTIVITAMIN WITH MINERALS) TABS tablet Take 1 tablet by mouth daily.   Yes Historical Provider, MD  naproxen (NAPROSYN) 500 MG tablet Take 500 mg by mouth 2 (two) times daily with a meal.    Yes Historical Provider, MD  nystatin-triamcinolone ointment (MYCOLOG) Apply 1 application topically 2 (two) times daily. 12/14/13  Yes Lysbeth Penner, FNP  oxcarbazepine (TRILEPTAL) 600  MG tablet Take 600 mg by mouth at bedtime.    Yes Historical Provider, MD  pregabalin (LYRICA) 75 MG capsule Take 75 mg by mouth 2 (two) times daily.    Yes Historical Provider, MD  topiramate (TOPAMAX) 50 MG tablet Take 100 mg by mouth at bedtime. 09/15/12  Yes Vernie Shanks, MD  ziprasidone (GEODON) 80 MG capsule Take 160 mg by mouth at bedtime.  03/29/13  Yes Historical Provider, MD  metolazone (ZAROXOLYN) 2.5 MG tablet Take 1 tablet (2.5 mg total) by mouth daily. 10/12/13   Lysbeth Penner, FNP    ALLERGIES:  Allergies  Allergen Reactions  . Apple Shortness Of Breath  . Penicillins Shortness Of Breath, Itching and Swelling  . Bee Venom Swelling    Takes benadryl to help with reaction    SOCIAL HISTORY:  History  Substance Use Topics  . Smoking status: Current Every Day Smoker -- 0.50 packs/day for 15 years    Types: Cigarettes  . Smokeless tobacco: Never Used  . Alcohol Use: No    FAMILY HISTORY: History reviewed. No pertinent family history.  EXAM: Triage Vitals: BP 130/76  Pulse 97  Temp(Src) 98.7 F (37.1 C) (Oral)  Resp 17  Ht 5\' 2"  (1.575 m)  Wt 192 lb (87.091 kg)  BMI 35.11 kg/m2  SpO2 95%  CONSTITUTIONAL: Alert and oriented and responds appropriately to questions. Well-appearing; well-nourished, nontoxic, pleasant HEAD: Normocephalic EYES: Conjunctivae clear, PERRL ENT: normal nose; no rhinorrhea; moist mucous membranes; pharynx without lesions noted NECK: Supple, no meningismus, no LAD  CARD: RRR; S1 and S2 appreciated; no murmurs, no clicks, no rubs, no gallops RESP: Normal chest excursion without splinting or tachypnea; breath sounds clear and equal bilaterally; no wheezes, no rhonchi, no rales,  ABD/GI: Normal bowel sounds; non-distended; soft, non-tender, no rebound, no guarding BACK:  The back appears normal and is non-tender to palpation, there is no CVA tenderness EXT: Normal ROM in all joints; non-tender to palpation; no edema; normal capillary  refill; no cyanosis    SKIN: Normal color for age and race; warm NEURO: Moves all extremities equally; no dysmetria to finger-nose-finger testing, sensation to touch intact diffusely, cranial nerves 2-12 intact, no clonus, 2+ deep tendon reflexes bilaterally upper and lower extremities, intermittent mild BUE tremor that is worse with movement, strength 5/5 in all 4 extremities, normal gait PSYCH: The patient's mood and manner are appropriate. Grooming and personal hygiene are appropriate.     MEDICAL DECISION MAKING: Patient here with jerking movements for the past 4 months. She states she has had numbness in her tongue and tingling in her hands and feet. Discussed with her that I Wilcox not feel this is a stroke or spinal injury. Very low suspicion for multiple sclerosis. She is currently neurologically intact and has no symptoms except a mild essential tremor. Have discussed  with her I recommend outpatient urology followup. She states her PCP is trying to refer her to a neurologist. Burnis Medin obtain labs, urine and a CT of her head. Doubt infarct, hemorrhage, cervical myelopathy, cauda equina, discitis or epidural abscess, epidural hematoma.  ED PROGRESS: Labs, urine and head CT are unremarkable. Urine drug screen is positive for opiates and benzodiazepines which patient is prescribed. We'll give her neurology outpatient followup information. Have discussed return precautions and supportive care instructions. She verbalized understanding and is comfortable with plan. Also discussed with patient that she is on multiple medications and her polypharmacy may be a contributing factor.   I personally performed the services described in this documentation, which was scribed in my presence. The recorded information has been reviewed and is accurate.    [place], Wilcox 01/21/14 2119  [place], Wilcox 01/21/14 2120

## 2014-01-21 NOTE — Discharge Instructions (Signed)

## 2014-01-26 ENCOUNTER — Encounter: Payer: Self-pay | Admitting: Gastroenterology

## 2014-02-08 ENCOUNTER — Telehealth: Payer: Self-pay | Admitting: Family Medicine

## 2014-02-18 ENCOUNTER — Encounter: Payer: Self-pay | Admitting: Neurology

## 2014-02-18 ENCOUNTER — Ambulatory Visit (INDEPENDENT_AMBULATORY_CARE_PROVIDER_SITE_OTHER): Payer: PRIVATE HEALTH INSURANCE | Admitting: Neurology

## 2014-02-18 VITALS — BP 112/68 | HR 91 | Resp 16 | Ht 62.0 in | Wt 191.0 lb

## 2014-02-18 DIAGNOSIS — R569 Unspecified convulsions: Secondary | ICD-10-CM

## 2014-02-18 DIAGNOSIS — G40219 Localization-related (focal) (partial) symptomatic epilepsy and epileptic syndromes with complex partial seizures, intractable, without status epilepticus: Secondary | ICD-10-CM
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
## 2014-02-18 NOTE — Patient Instructions (Signed)
1. Schedule MRI brain with and without contrast 2. Schedule routine EEG, followed by 24-hour EEG 3. Continue all your current medications  Seizure Precautions: 1. If medication has been prescribed for you to prevent seizures, take it exactly as directed.  Do not stop taking the medicine without talking to your doctor first, even if you have not had a seizure in a long time.   2. Avoid activities in which a seizure would cause danger to yourself or to others.  Don't operate dangerous machinery, swim alone, or climb in high or dangerous places, such as on ladders, roofs, or girders.  Do not drive unless your doctor says you may.  3. If you have any warning that you may have a seizure, lay down in a safe place where you can't hurt yourself.    4.  No driving for 6 months from last seizure, as per [facility].   Please refer to the following link on the [place] website for more information: http://[email or web address].org/[email or web address]/driving/drivingu.cfm   5.  Maintain good sleep hygiene.  6.  Contact your doctor if you have any problems that may be related to the medicine you are taking.  7.  Call 911 and bring the patient back to the ED if:        A.  The seizure lasts longer than 5 minutes.       B.  The patient doesn't awaken shortly after the seizure  C.  The patient has new problems such as difficulty seeing, speaking or moving  D.  The patient was injured during the seizure  E.  The patient has a temperature over 102 F (39C)  F.  The patient vomited and now is having trouble breathing

## 2014-02-19 ENCOUNTER — Encounter (HOSPITAL_COMMUNITY): Payer: Self-pay | Admitting: Emergency Medicine

## 2014-02-19 DIAGNOSIS — K219 Gastro-esophageal reflux disease without esophagitis: Secondary | ICD-10-CM | POA: Insufficient documentation

## 2014-02-19 DIAGNOSIS — Y9301 Activity, walking, marching and hiking: Secondary | ICD-10-CM | POA: Insufficient documentation

## 2014-02-19 DIAGNOSIS — G8929 Other chronic pain: Secondary | ICD-10-CM | POA: Diagnosis not present

## 2014-02-19 DIAGNOSIS — Z880 Allergy status to penicillin: Secondary | ICD-10-CM | POA: Diagnosis not present

## 2014-02-19 DIAGNOSIS — F17200 Nicotine dependence, unspecified, uncomplicated: Secondary | ICD-10-CM | POA: Insufficient documentation

## 2014-02-19 DIAGNOSIS — Z98811 Dental restoration status: Secondary | ICD-10-CM | POA: Diagnosis not present

## 2014-02-19 DIAGNOSIS — G40909 Epilepsy, unspecified, not intractable, without status epilepticus: Secondary | ICD-10-CM | POA: Diagnosis not present

## 2014-02-19 DIAGNOSIS — F319 Bipolar disorder, unspecified: Secondary | ICD-10-CM | POA: Diagnosis not present

## 2014-02-19 DIAGNOSIS — Z23 Encounter for immunization: Secondary | ICD-10-CM | POA: Diagnosis not present

## 2014-02-19 DIAGNOSIS — F411 Generalized anxiety disorder: Secondary | ICD-10-CM | POA: Diagnosis not present

## 2014-02-19 DIAGNOSIS — IMO0002 Reserved for concepts with insufficient information to code with codable children: Secondary | ICD-10-CM | POA: Insufficient documentation

## 2014-02-19 DIAGNOSIS — J45909 Unspecified asthma, uncomplicated: Secondary | ICD-10-CM | POA: Insufficient documentation

## 2014-02-19 DIAGNOSIS — S91109A Unspecified open wound of unspecified toe(s) without damage to nail, initial encounter: Secondary | ICD-10-CM | POA: Insufficient documentation

## 2014-02-19 DIAGNOSIS — Y9289 Other specified places as the place of occurrence of the external cause: Secondary | ICD-10-CM | POA: Diagnosis not present

## 2014-02-19 DIAGNOSIS — Z791 Long term (current) use of non-steroidal anti-inflammatories (NSAID): Secondary | ICD-10-CM | POA: Diagnosis not present

## 2014-02-19 DIAGNOSIS — W268XXA Contact with other sharp object(s), not elsewhere classified, initial encounter: Secondary | ICD-10-CM | POA: Diagnosis not present

## 2014-02-19 NOTE — ED Notes (Signed)
Patient has a fish hook in her right second toe.

## 2014-02-20 ENCOUNTER — Emergency Department (HOSPITAL_COMMUNITY)
Admission: EM | Admit: 2014-02-20 | Discharge: 2014-02-20 | Disposition: A | Discharge status: Home / Self Care | Payer: PRIVATE HEALTH INSURANCE | Attending: Emergency Medicine | Admitting: Emergency Medicine

## 2014-02-20 DIAGNOSIS — S91109A Unspecified open wound of unspecified toe(s) without damage to nail, initial encounter: Secondary | ICD-10-CM | POA: Diagnosis not present

## 2014-02-20 DIAGNOSIS — S90454A Superficial foreign body, right lesser toe(s), initial encounter: Secondary | ICD-10-CM

## 2014-02-20 MED ORDER — TETANUS-DIPHTH-ACELL PERTUSSIS 5-2.5-18.5 LF-MCG/0.5 IM SUSP
0.5000 mL | Freq: Once | INTRAMUSCULAR | Status: AC
  Administered 2014-02-20: 0.5 mL via INTRAMUSCULAR
  Filled 2014-02-20: qty 0.5

## 2014-02-20 MED ORDER — HYDROCODONE-ACETAMINOPHEN 5-325 MG PO TABS: 1.0000 | ORAL_TABLET | ORAL | Status: DC | PRN

## 2014-02-20 MED ORDER — HYDROCODONE-ACETAMINOPHEN 5-325 MG PO TABS
2.0000 | ORAL_TABLET | Freq: Once | ORAL | Status: AC
  Administered 2014-02-20: 2 via ORAL
  Filled 2014-02-20: qty 2

## 2014-02-20 MED ORDER — LIDOCAINE HCL (PF) 1 % IJ SOLN
5.0000 mL | Freq: Once | INTRAMUSCULAR | Status: AC
  Administered 2014-02-20: 5 mL
  Filled 2014-02-20: qty 5

## 2014-02-20 MED ORDER — CIPROFLOXACIN HCL 250 MG PO TABS
500.0000 mg | ORAL_TABLET | Freq: Once | ORAL | Status: AC
  Administered 2014-02-20: 500 mg via ORAL
  Filled 2014-02-20: qty 2

## 2014-02-20 NOTE — ED Provider Notes (Signed)
CSN: [id number]     Arrival date & time 02/19/14  2330 History   First MD Initiated Contact with Patient 02/19/14 2340     Chief Complaint  Patient presents with  . Foot Injury     (Consider location/radiation/quality/duration/timing/severity/associated sxs/prior Treatment) HPI Comments: Pt was walking barefoot in a carpet area where fish hook had been dropped. Pt got hook in the right second toe. Pt not sure of last tetanus.  Patient is a 53 y.o. female presenting with foot injury. The history is provided by the patient.  Foot Injury Location:  Foot Time since incident:  1 hour Injury: yes   Mechanism of injury comment:  Fish hook stuck in the right 2nd toe Foot location:  R foot (2nd to of the right foot) Pain details:    Quality:  Aching   Radiates to:  Does not radiate   Severity:  Moderate   Onset quality:  Sudden   Duration:  1 hour   Timing:  Constant   Progression:  Worsening Chronicity:  New Dislocation: no   Foreign body present: fish hook. Tetanus status:  Out of date Prior injury to area:  No Relieved by:  Nothing Worsened by:  Nothing tried Ineffective treatments:  None tried Associated symptoms: back pain and swelling   Associated symptoms: no neck pain, no numbness and no tingling   Risk factors: no frequent fractures     Past Medical History  Diagnosis Date  . Anxiety   . Seizures     states "silent seizures"; is on anticonvulsant; none in 2-3 mos.  . Headache(784.0)     1-2 x/month  . Asthma     daily inhaler  . GERD (gastroesophageal reflux disease)   . Full dentures   . Bipolar disorder   . Depression   . Chronic back pain greater than 3 months duration   . 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 History of ankle sprain     right; 2 weeks ago;  c/o severe pain   Past Surgical History  Procedure Laterality Date  . Cervical fusion      x 2  . Tubal ligation    . Abdominal hysterectomy      partial  . Inguinal hernia repair  Right   . Bunionectomy Left   . Diagnostic laparoscopy  11/08/2006  . Laparoscopic appendectomy  11/08/2006  . Lumbar laminectomy  02/05/2007    L4-5 fusion  . Carpal tunnel release Right 01/13/2013    Procedure: CARPAL TUNNEL RELEASE;  Surgeon: Cammie Sickle., MD;  Location: [place];  Service: Orthopedics;  Laterality: Right;   Family History  Problem Relation Age of Onset  . Diabetes Maternal Grandmother    History  Substance Use Topics  . Smoking status: Current Every Day Smoker -- 0.50 packs/day for 15 years    Types: Cigarettes  . Smokeless tobacco: Never Used  . Alcohol Use: No   OB History   Grav Para Term Preterm Abortions TAB SAB Ect Mult Living                 Review of Systems  Constitutional: Negative for activity change.       All ROS Neg except as noted in HPI  Eyes: Negative for photophobia and discharge.  Respiratory: Negative for cough, shortness of breath and wheezing.   Cardiovascular: Negative for chest pain and palpitations.  Gastrointestinal: Negative for abdominal pain and blood in stool.  Genitourinary: Negative  for dysuria, frequency and hematuria.  Musculoskeletal: Positive for arthralgias and back pain. Negative for neck pain.  Skin: Negative.   Neurological: Positive for seizures. Negative for dizziness and speech difficulty.  Psychiatric/Behavioral: Negative for hallucinations and confusion. The patient is nervous/anxious.       Allergies  Apple; Penicillins; and Bee venom  Home Medications   Prior to Admission medications   Medication Sig Start Date End Date Taking? Authorizing Provider  albuterol (PROAIR HFA) 108 (90 BASE) MCG/ACT inhaler Inhale 2 puffs into the lungs every 4 (four) hours as needed for wheezing or shortness of breath. 11/03/13  Yes Lysbeth Penner, FNP  beclomethasone (QVAR) 80 MCG/ACT inhaler Inhale 2 puffs into the lungs 2 (two) times daily. 11/03/13  Yes Lysbeth Penner, FNP  carbamazepine (TEGRETOL)  200 MG tablet Take 200 mg by mouth 3 (three) times daily.   Yes Historical Provider, MD  clonazePAM (KLONOPIN) 1 MG tablet Take 1 mg by mouth 3 (three) times daily. 09/15/12  Yes Vernie Shanks, MD  esomeprazole (NEXIUM) 40 MG capsule Take 1 capsule (40 mg total) by mouth 2 (two) times daily before a meal. 12/14/13  Yes Lysbeth Penner, FNP  FLUoxetine (PROZAC) 20 MG capsule Take 60 mg by mouth daily.  03/29/13  Yes Historical Provider, MD  furosemide (LASIX) 40 MG tablet TAKE 1 TABLET (40 MG TOTAL) BY MOUTH DAILY. 01/15/14  Yes Lysbeth Penner, FNP  HYDROcodone-acetaminophen (NORCO) 10-325 MG per tablet Take 1 tablet by mouth every 8 (eight) hours as needed for moderate pain.    Yes Historical Provider, MD  imipramine (TOFRANIL) 50 MG tablet TAKE 2 TABLETS BY MOUTH AT BEDTIME 01/15/14  Yes Lysbeth Penner, FNP  methocarbamol (ROBAXIN) 500 MG tablet Take 500 mg by mouth 3 (three) times daily.    Yes Historical Provider, MD  metolazone (ZAROXOLYN) 2.5 MG tablet Take 1 tablet (2.5 mg total) by mouth daily. 10/12/13  Yes Lysbeth Penner, FNP  Multiple Vitamin (MULTIVITAMIN WITH MINERALS) TABS tablet Take 1 tablet by mouth daily.   Yes Historical Provider, MD  naproxen (NAPROSYN) 500 MG tablet Take 500 mg by mouth 2 (two) times daily with a meal.    Yes Historical Provider, MD  oxcarbazepine (TRILEPTAL) 600 MG tablet Take 600 mg by mouth at bedtime.    Yes Historical Provider, MD  pregabalin (LYRICA) 75 MG capsule Take 75 mg by mouth 2 (two) times daily.    Yes Historical Provider, MD  topiramate (TOPAMAX) 50 MG tablet Take 100 mg by mouth at bedtime. 09/15/12  Yes Vernie Shanks, MD  ziprasidone (GEODON) 80 MG capsule Take 160 mg by mouth at bedtime.  03/29/13  Yes Historical Provider, MD   BP 132/80  Pulse 109  Temp(Src) 97.6 F (36.4 C) (Oral)  Resp 22  Ht 5\' 2"  (1.575 m)  Wt 191 lb (86.637 kg)  BMI 34.93 kg/m2  SpO2 98% Physical Exam  Nursing note and vitals reviewed. Constitutional: She is  oriented to person, place, and time. She appears well-developed and well-nourished.  Non-toxic appearance.  HENT:  Head: Normocephalic.  Right Ear: Tympanic membrane and external ear normal.  Left Ear: Tympanic membrane and external ear normal.  Eyes: EOM and lids are normal. Pupils are equal, round, and reactive to light.  Neck: Normal range of motion. Neck supple. Carotid bruit is not present.  Cardiovascular: Normal rate, regular rhythm, normal heart sounds, intact distal pulses and normal pulses.   Pulmonary/Chest: Breath sounds normal. No  respiratory distress.  Abdominal: Soft. Bowel sounds are normal. There is no tenderness. There is no guarding.  Musculoskeletal: Normal range of motion.       Feet:  Lymphadenopathy:       Head (right side): No submandibular adenopathy present.       Head (left side): No submandibular adenopathy present.    She has no cervical adenopathy.  Neurological: She is alert and oriented to person, place, and time. She has normal strength. No cranial nerve deficit or sensory deficit.  Skin: Skin is warm and dry.  Psychiatric: Her speech is normal. Her mood appears anxious.    ED Course  FOREIGN BODY REMOVAL Date/Time: 02/20/2014 1:15 AM Performed by: Lenox Ahr Authorized by: Lenox Ahr Consent: Verbal consent obtained. Risks and benefits: risks, benefits and alternatives were discussed Consent given by: patient Patient understanding: patient states understanding of the procedure being performed Required items: required blood products, implants, devices, and special equipment available Patient identity confirmed: arm band Time out: Immediately prior to procedure a "time out" was called to verify the correct patient, procedure, equipment, support staff and site/side marked as required. Body area: skin General location: lower extremity Location details: right second toe Anesthesia: digital block Local anesthetic: lidocaine 1% without  epinephrine Patient sedated: no Patient restrained: no Patient cooperative: yes Localization method: visualized Removal mechanism: forceps Dressing: dressing applied Tendon involvement: none Depth: subcutaneous Complexity: simple 1 objects recovered. Objects recovered: fishing hook Post-procedure assessment: foreign body removed Patient tolerance: Patient tolerated the procedure well with no immediate complications.   (including critical care time) Labs Review Labs Reviewed - No data to display  Imaging Review No results found.   EKG Interpretation None      MDM  Pt stepped on a fish hook and injured the second toe. Fish hook removed. Tetanus updated Pt instructed to return if unusual swelling, pus drainage, red streaking, fever, or deterioration of general condition.   Final diagnoses:  None    *I have reviewed nursing notes, vital signs, and all appropriate lab and imaging results for this patient.Lenox Ahr, PA-C 02/22/14 [phone]

## 2014-02-20 NOTE — ED Notes (Signed)
Patient verbalizes understanding of discharge instructions, pain management, and wound care. Patient ambulatory out of department at this time with family.

## 2014-02-20 NOTE — Discharge Instructions (Signed)
Please cleanse the wound with soap and water, and apply band-aid daily until healed. Use tylenol or ibuprofen for mild pain. Use norco for more severe pain. See Dr Laurance Flatten or return to the Emergency Dept if any pus-like drainage or signs of advancing infection.

## 2014-02-20 NOTE — ED Notes (Signed)
Patient states she was walking through the house barefoot and a fish hook went into her right second toe. Patient has movement to affected digit, and bleeding is controlled. Patient A&OX4 at this time. Family member at bedside, no needs voiced.

## 2014-02-22 ENCOUNTER — Ambulatory Visit (INDEPENDENT_AMBULATORY_CARE_PROVIDER_SITE_OTHER): Payer: PRIVATE HEALTH INSURANCE | Admitting: Neurology

## 2014-02-22 DIAGNOSIS — R569 Unspecified convulsions: Secondary | ICD-10-CM

## 2014-02-23 ENCOUNTER — Encounter: Payer: Self-pay | Admitting: Neurology

## 2014-02-23 DIAGNOSIS — R569 Unspecified convulsions: Secondary | ICD-10-CM | POA: Insufficient documentation

## 2014-02-23 NOTE — Procedures (Signed)
ELECTROENCEPHALOGRAM REPORT  Date of Study: 02/22/2014  Patient's Name: Darlene Wilcox MRN: [id number] Date of Birth: 1960/06/06  Referring Provider: Dr. Ellouise Newer  Clinical History: This is a 53 year old woman with seizures in childhood that recurred 2 years ago, with episodes of staring that progress to convulsions. She also reports daily body jerks  Medications: Tegretol, clonazepam, Trileptal, Topamax, Lyrica, Geodon, Prozac  Technical Summary: A multichannel digital EEG recording measured by the international 10-20 system with electrodes applied with paste and impedances below 5000 ohms performed in our laboratory with EKG monitoring in an awake and asleep patient.  Hyperventilation was not performed. Photic stimulation was performed.  The digital EEG was referentially recorded, reformatted, and digitally filtered in a variety of bipolar and referential montages for optimal display.    Description: The patient is awake and asleep during the recording.  During maximal wakefulness, there is a symmetric, medium voltage 10 Hz posterior dominant rhythm that attenuates with eye opening.  During drowsiness and sleep, there is an increase in theta slowing of the background. Occasional focal 5 Hz theta slowing is seen over the left frontotemporal region during drowsiness and sleep, at times sharply contoured without clear epileptogenic potential.  Vertex waves and symmetric sleep spindles were seen.  Photic stimulation did not elicit any abnormalities. Right arm jerks did not show electrographic correlate.  There were no epileptiform discharges or electrographic seizures seen.    EKG lead was unremarkable.  Impression: This awake and asleep EEG is abnormal due to occasional focal slowing over the left frontotemporal region.  Clinical Correlation of the above findings indicates focal cerebral dysfunction over the left frontotemporal region suggestive of underlying structural or physiologic  abnormality.  Right arm jerks did not show electrographic correlate.  The absence of epileptiform discharges does not exclude a clinical diagnosis of epilepsy.  If further clinical questions remain, prolonged EEG may be helpful.  Clinical correlation is advised.   Ellouise Newer, M.D.

## 2014-02-24 NOTE — ED Provider Notes (Signed)
Medical screening examination/treatment/procedure(s) were conducted as a shared visit with non-physician practitioner(s) or resident  and myself.  I personally evaluated the patient during the encounter and agree with the findings.   I have personally reviewed any xrays and/ or EKG's with the provider and I agree with interpretation.   Fish hook impaled into 2nd toe. Superficial.  Tetanus updated.  No other injuries.   Removed by PA.  Fup discussed.    Mariea Clonts, MD 02/24/14 [phone]

## 2014-02-25 ENCOUNTER — Inpatient Hospital Stay (HOSPITAL_COMMUNITY): Admission: RE | Admit: 2014-02-25 | Payer: Medicare Other | Source: Ambulatory Visit

## 2014-03-01 ENCOUNTER — Ambulatory Visit (HOSPITAL_COMMUNITY)
Admission: RE | Admit: 2014-03-01 | Discharge: 2014-03-01 | Disposition: A | Discharge status: Home / Self Care | Payer: PRIVATE HEALTH INSURANCE | Source: Ambulatory Visit | Attending: Neurology | Admitting: Neurology

## 2014-03-01 DIAGNOSIS — R569 Unspecified convulsions: Secondary | ICD-10-CM | POA: Insufficient documentation

## 2014-03-01 MED ORDER — GADOBENATE DIMEGLUMINE 529 MG/ML IV SOLN
20.0000 mL | Freq: Once | INTRAVENOUS | Status: AC | PRN
  Administered 2014-03-01: 20 mL via INTRAVENOUS

## 2014-03-17 ENCOUNTER — Ambulatory Visit (INDEPENDENT_AMBULATORY_CARE_PROVIDER_SITE_OTHER): Payer: PRIVATE HEALTH INSURANCE | Admitting: Neurology

## 2014-03-17 DIAGNOSIS — R569 Unspecified convulsions: Secondary | ICD-10-CM

## 2014-03-18 ENCOUNTER — Encounter: Payer: Self-pay | Admitting: *Deleted

## 2014-03-20 ENCOUNTER — Other Ambulatory Visit: Payer: Self-pay | Admitting: Family Medicine

## 2014-03-24 ENCOUNTER — Other Ambulatory Visit (HOSPITAL_COMMUNITY): Payer: Self-pay | Admitting: Gastroenterology

## 2014-03-24 ENCOUNTER — Ambulatory Visit (INDEPENDENT_AMBULATORY_CARE_PROVIDER_SITE_OTHER): Payer: PRIVATE HEALTH INSURANCE | Admitting: Gastroenterology

## 2014-03-24 ENCOUNTER — Encounter: Payer: Self-pay | Admitting: Gastroenterology

## 2014-03-24 VITALS — BP 110/68 | HR 101 | Ht 62.0 in | Wt 195.2 lb

## 2014-03-24 DIAGNOSIS — R1314 Dysphagia, pharyngoesophageal phase: Secondary | ICD-10-CM

## 2014-03-24 DIAGNOSIS — K219 Gastro-esophageal reflux disease without esophagitis: Secondary | ICD-10-CM

## 2014-03-24 DIAGNOSIS — Z1211 Encounter for screening for malignant neoplasm of colon: Secondary | ICD-10-CM

## 2014-03-24 DIAGNOSIS — R1310 Dysphagia, unspecified: Secondary | ICD-10-CM

## 2014-03-24 MED ORDER — MOVIPREP 100 G PO SOLR: ORAL | Status: DC

## 2014-03-24 NOTE — Patient Instructions (Addendum)
You have been scheduled for an endoscopy and colonoscopy. Please follow the written instructions given to you at your visit today. Please pick up your prep at the pharmacy within the next 1-3 days. If you use inhalers (even only as needed), please bring them with you on the day of your procedure.   Today you have been given Anti-reflux information to read and follow.  You have been scheduled for a Barium Esophogram at [facility] (1st floor of the hospital) on 04/02/14 at 1:00pm. Please arrive 15 minutes prior to your appointment for registration. not to have  If you need to reschedule for any reason, please contact radiology at [phone] to do so. __________________________________________________________________ A barium swallow is an examination that concentrates on views of the esophagus. This tends to be a double contrast exam (barium and two liquids which, when combined, create a gas to distend the wall of the oesophagus) or single contrast (non-ionic iodine based). The study is usually tailored to your symptoms so a good history is essential. Attention is paid during the study to the form, structure and configuration of the esophagus, looking for functional disorders (such as aspiration, dysphagia, achalasia, motility and reflux) EXAMINATION You may be asked to change into a gown, depending on the type of swallow being performed. A radiologist and radiographer will perform the procedure. The radiologist will advise you of the type of contrast selected for your procedure and direct you during the exam. You will be asked to stand, sit or lie in several different positions and to hold a small amount of fluid in your mouth before being asked to swallow while the imaging is performed .In some instances you may be asked to swallow barium coated marshmallows to assess the motility of a solid food bolus. The exam can be recorded as a digital or video fluoroscopy procedure. POST PROCEDURE It  will take 1-2 days for the barium to pass through your system. To facilitate this, it is important, unless otherwise directed, to increase your fluids for the next 24-48hrs and to resume your normal diet.  This test typically takes about 30 minutes to perform. __________________________________________________________________________________

## 2014-03-24 NOTE — Progress Notes (Signed)
History of Present Illness: This is a 53 year old female who notes problems with dysphagia to liquids for several months. She describes choking and coughing when drinking liquids. She localizes the symptoms to her upper neck. She has also noted abdominal bloating over the past few months. She has chronic GERD which has been treated with Nexium daily for several years but her symptoms worsened and she was increased to twice daily Nexium with very good control of her reflux symptoms. She is being followed by Dr. Delice Lesch for a seizure disorder. Denies weight loss, abdominal pain, constipation, diarrhea, change in stool caliber, melena, hematochezia, nausea, vomiting, chest pain.  Allergies  Allergen Reactions  . Apple Shortness Of Breath  . Penicillins Shortness Of Breath, Itching and Swelling  . Bee Venom Swelling    Takes benadryl to help with reaction   Outpatient Prescriptions Prior to Visit  Medication Sig Dispense Refill  . albuterol (PROAIR HFA) 108 (90 BASE) MCG/ACT inhaler Inhale 2 puffs into the lungs every 4 (four) hours as needed for wheezing or shortness of breath.  8.5 each  5  . beclomethasone (QVAR) 80 MCG/ACT inhaler Inhale 2 puffs into the lungs 2 (two) times daily.  1 Inhaler  12  . carbamazepine (TEGRETOL) 200 MG tablet Take 200 mg by mouth 3 (three) times daily.      . clonazePAM (KLONOPIN) 1 MG tablet Take 1 mg by mouth 3 (three) times daily.      Marland Kitchen esomeprazole (NEXIUM) 40 MG capsule Take 1 capsule (40 mg total) by mouth 2 (two) times daily before a meal.  60 capsule  5  . FLUoxetine (PROZAC) 20 MG capsule Take 60 mg by mouth daily.       . furosemide (LASIX) 40 MG tablet TAKE 1 TABLET (40 MG TOTAL) BY MOUTH DAILY.  30 tablet  2  . HYDROcodone-acetaminophen (NORCO) 10-325 MG per tablet Take 1 tablet by mouth every 8 (eight) hours as needed for moderate pain.       Marland Kitchen HYDROcodone-acetaminophen (NORCO/VICODIN) 5-325 MG per tablet Take 1 tablet by mouth every 4 (four) hours as  needed.  10 tablet  0  . imipramine (TOFRANIL) 50 MG tablet TAKE 2 TABLETS BY MOUTH AT BEDTIME  60 tablet  1  . methocarbamol (ROBAXIN) 500 MG tablet Take 500 mg by mouth 3 (three) times daily.       . metolazone (ZAROXOLYN) 2.5 MG tablet Take 1 tablet (2.5 mg total) by mouth daily.  3 tablet  1  . Multiple Vitamin (MULTIVITAMIN WITH MINERALS) TABS tablet Take 1 tablet by mouth daily.      . naproxen (NAPROSYN) 500 MG tablet Take 500 mg by mouth 2 (two) times daily with a meal.       . naproxen (NAPROSYN) 500 MG tablet TAKE 1 TABLET (500 MG TOTAL) BY MOUTH 2 (TWO) TIMES DAILY WITH A MEAL.  60 tablet  0  . oxcarbazepine (TRILEPTAL) 600 MG tablet Take 600 mg by mouth at bedtime.       . pregabalin (LYRICA) 75 MG capsule Take 75 mg by mouth 2 (two) times daily.       Marland Kitchen topiramate (TOPAMAX) 50 MG tablet Take 100 mg by mouth at bedtime.      . ziprasidone (GEODON) 80 MG capsule Take 160 mg by mouth at bedtime.        No facility-administered medications prior to visit.   Past Medical History  Diagnosis Date  . Anxiety   . Seizures  states "silent seizures"; is on anticonvulsant; none in 2-3 mos.  . Headache(784.0)     1-2 x/month  . Asthma     daily inhaler  . GERD (gastroesophageal reflux disease)   . Full dentures   . Bipolar disorder   . Depression   . Chronic back pain greater than 3 months duration   . 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 History of ankle sprain     right; 2 weeks ago;  c/o severe pain   Past Surgical History  Procedure Laterality Date  . Cervical fusion      x 2  . Tubal ligation    . Abdominal hysterectomy      partial  . Inguinal hernia repair Right   . Bunionectomy Left   . Diagnostic laparoscopy  11/08/2006  . Laparoscopic appendectomy  11/08/2006  . Lumbar laminectomy  02/05/2007    L4-5 fusion  . Carpal tunnel release Right 01/13/2013    Procedure: CARPAL TUNNEL RELEASE;  Surgeon: Cammie Sickle., MD;  Location: [place];  Service: Orthopedics;  Laterality: Right;   History   Social History  . Marital Status: Divorced    Spouse Name: N/A    Number of Children: N/A  . Years of Education: N/A   Social History Main Topics  . Smoking status: Current Every Day Smoker -- 0.50 packs/day for 15 years    Types: Cigarettes  . Smokeless tobacco: Never Used  . Alcohol Use: No  . Drug Use: No  . Sexual Activity: None   Other Topics Concern  . None   Social History Narrative  . None   Family History  Problem Relation Age of Onset  . Diabetes Maternal Grandmother      Review of Systems: Pertinent positive and negative review of systems were noted in the above HPI section. All other review of systems were otherwise negative.   Physical Exam: General: Well developed , well nourished, no acute distress Head: Normocephalic and atraumatic Eyes:  sclerae anicteric, EOMI Ears: Normal auditory acuity Mouth: No deformity or lesions Neck: Supple, no masses or thyromegaly Lungs: Clear throughout to auscultation Heart: Regular rate and rhythm; no murmurs, rubs or bruits Abdomen: Soft, non tender and non distended. No masses, hepatosplenomegaly or hernias noted. Normal Bowel sounds Rectal: deferred to colonoscopy Musculoskeletal: Symmetrical with no gross deformities  Skin: No lesions on visible extremities Pulses:  Normal pulses noted Extremities: No clubbing, cyanosis, edema or deformities noted Neurological: Alert oriented x 4, grossly nonfocal Cervical Nodes:  No significant cervical adenopathy Inguinal Nodes: No significant inguinal adenopathy Psychological:  Alert and cooperative. Normal mood and affect  Assessment and Recommendations:  1. Dysphagia with liquids. Symptoms suspicious for oropharyngeal dysphagia. Rule out esophageal strictures, esophagitis. Schedule MBSS and EGD. The risks, benefits, and alternatives to endoscopy with possible biopsy and possible dilation were discussed with the  patient and they consent to proceed.   2. GERD. Continue Nexium 40 mg twice daily and standard antireflux measures. EGD as above to assess for esophagitis and Barrett's.  3. CRC screening, average risk. The risks, benefits, and alternatives to colonoscopy with possible biopsy and possible polypectomy were discussed with the patient and they consent to proceed.

## 2014-03-26 ENCOUNTER — Telehealth: Payer: Self-pay | Admitting: Neurology

## 2014-03-26 NOTE — Telephone Encounter (Signed)
Called patient, she sounded as if she had just woken up/speech somewhat slurred, reports she has been feeling unwell the past 3 days. Denies any seizures. Discussed EEG, no seizure discharges seen. I feel she is having side effects from polypharmacy and would benefit from reduction in some of her medications. Recommended she speak with prescribing MD, she will call her doctor and knows to go to ER if symptoms worsen.

## 2014-03-26 NOTE — Procedures (Signed)
ELECTROENCEPHALOGRAM REPORT  Dates of Recording: 03/17/2014 to 03/18/2014  Patient's Name: Darlene Wilcox MRN: [id number] Date of Birth: [birth year]  Referring Provider: Dr. 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 She also reports daily body jerks. EEG for classification.  Medications: Tegretol, clonazepam, Trileptal, Topamax, Lyrica, Geodon, Prozac  Technical Summary: This is a 24-hour multichannel digital EEG recording measured by the international 10-20 system with electrodes applied with paste and impedances below 5000 ohms performed as portable with EKG monitoring.  The digital EEG was referentially recorded, reformatted, and digitally filtered in a variety of bipolar and referential montages for optimal display.    DESCRIPTION OF RECORDING: During maximal wakefulness, the background activity consisted of a symmetric 9.5-10 Hz posterior dominant rhythm which was reactive to eye opening.  There was occasional focal 4-5 Hz theta slowing seen over the left temporal region, at times sharply contoured without clear epileptogenic potential. There were no clear epileptiform discharges seen in wakefulness.  During the recording, the patient progresses through wakefulness, drowsiness, and Stage 2 sleep. Similar occasional focal slowing is seen over the left temporal region.  Again, there were no epileptiform discharges seen.  Events: There were no push button events. Patient did not return diary.  There were no electrographic seizures seen.  EKG lead was unremarkable.  IMPRESSION: This 24-hour ambulatory EEG study is abnormal due to occasional focal slowing over the left temporal region.  CLINICAL CORRELATION of the above findings indicates focal cerebral dysfunction over the left temporal region suggestive of underlying structural or  physiologic abnormality. The absence of epileptiform discharges does not exclude a clinical diagnosis of epilepsy. Typical events were not reported.   If further clinical questions remain, inpatient video EEG monitoring may be helpful. Clinical correlation is advised.   Ellouise Newer, M.D.

## 2014-03-29 ENCOUNTER — Encounter (HOSPITAL_COMMUNITY): Payer: Self-pay | Admitting: *Deleted

## 2014-03-29 ENCOUNTER — Emergency Department (HOSPITAL_COMMUNITY)
Admission: EM | Admit: 2014-03-29 | Discharge: 2014-03-29 | Disposition: A | Discharge status: Home / Self Care | Payer: PRIVATE HEALTH INSURANCE | Attending: Emergency Medicine | Admitting: Emergency Medicine

## 2014-03-29 ENCOUNTER — Emergency Department (HOSPITAL_COMMUNITY): Payer: PRIVATE HEALTH INSURANCE

## 2014-03-29 DIAGNOSIS — G40909 Epilepsy, unspecified, not intractable, without status epilepticus: Secondary | ICD-10-CM | POA: Insufficient documentation

## 2014-03-29 DIAGNOSIS — R05 Cough: Secondary | ICD-10-CM | POA: Diagnosis present

## 2014-03-29 DIAGNOSIS — Z880 Allergy status to penicillin: Secondary | ICD-10-CM | POA: Insufficient documentation

## 2014-03-29 DIAGNOSIS — Z79899 Other long term (current) drug therapy: Secondary | ICD-10-CM | POA: Diagnosis not present

## 2014-03-29 DIAGNOSIS — R51 Headache: Secondary | ICD-10-CM | POA: Insufficient documentation

## 2014-03-29 DIAGNOSIS — Z791 Long term (current) use of non-steroidal anti-inflammatories (NSAID): Secondary | ICD-10-CM | POA: Diagnosis not present

## 2014-03-29 DIAGNOSIS — J189 Pneumonia, unspecified organism: Secondary | ICD-10-CM

## 2014-03-29 DIAGNOSIS — Z7951 Long term (current) use of inhaled steroids: Secondary | ICD-10-CM | POA: Diagnosis not present

## 2014-03-29 DIAGNOSIS — J181 Lobar pneumonia, unspecified organism: Secondary | ICD-10-CM

## 2014-03-29 DIAGNOSIS — R110 Nausea: Secondary | ICD-10-CM | POA: Diagnosis not present

## 2014-03-29 DIAGNOSIS — F319 Bipolar disorder, unspecified: Secondary | ICD-10-CM | POA: Diagnosis not present

## 2014-03-29 DIAGNOSIS — Z720 Tobacco use: Secondary | ICD-10-CM | POA: Insufficient documentation

## 2014-03-29 DIAGNOSIS — G8929 Other chronic pain: Secondary | ICD-10-CM | POA: Diagnosis not present

## 2014-03-29 DIAGNOSIS — K219 Gastro-esophageal reflux disease without esophagitis: Secondary | ICD-10-CM | POA: Diagnosis not present

## 2014-03-29 DIAGNOSIS — Z87828 Personal history of other (healed) physical injury and trauma: Secondary | ICD-10-CM | POA: Diagnosis not present

## 2014-03-29 DIAGNOSIS — F419 Anxiety disorder, unspecified: Secondary | ICD-10-CM | POA: Diagnosis not present

## 2014-03-29 DIAGNOSIS — R059 Cough, unspecified: Secondary | ICD-10-CM

## 2014-03-29 DIAGNOSIS — J45909 Unspecified asthma, uncomplicated: Secondary | ICD-10-CM | POA: Diagnosis not present

## 2014-03-29 MED ORDER — HYDROCODONE-ACETAMINOPHEN 5-325 MG PO TABS: 1.0000 | ORAL_TABLET | ORAL | Status: DC | PRN

## 2014-03-29 MED ORDER — HYDROCODONE-ACETAMINOPHEN 5-325 MG PO TABS
2.0000 | ORAL_TABLET | Freq: Once | ORAL | Status: AC
  Administered 2014-03-29: 2 via ORAL
  Filled 2014-03-29: qty 2

## 2014-03-29 MED ORDER — PREDNISONE 10 MG PO TABS: ORAL_TABLET | ORAL | Status: DC

## 2014-03-29 MED ORDER — LEVOFLOXACIN 500 MG PO TABS: 500.0000 mg | ORAL_TABLET | Freq: Every day | ORAL | Status: DC

## 2014-03-29 MED ORDER — LEVOFLOXACIN 500 MG PO TABS
500.0000 mg | ORAL_TABLET | Freq: Every day | ORAL | Status: DC
  Administered 2014-03-29: 500 mg via ORAL
  Filled 2014-03-29: qty 1

## 2014-03-29 MED ORDER — ALBUTEROL SULFATE HFA 108 (90 BASE) MCG/ACT IN AERS
2.0000 | INHALATION_SPRAY | Freq: Once | RESPIRATORY_TRACT | Status: AC
  Administered 2014-03-29: 2 via RESPIRATORY_TRACT
  Filled 2014-03-29: qty 6.7

## 2014-03-29 MED ORDER — PREDNISONE 10 MG PO TABS
60.0000 mg | ORAL_TABLET | Freq: Once | ORAL | Status: AC
  Administered 2014-03-29: 60 mg via ORAL
  Filled 2014-03-29 (×2): qty 1

## 2014-03-29 NOTE — ED Notes (Signed)
NAD noted. VSS.

## 2014-03-29 NOTE — ED Provider Notes (Signed)
CSN: [id number]     Arrival date & time 03/29/14  1223 History  This chart was scribed for non-physician practitioner Lenox Ahr, PA-C working with Nat Christen, MD by Rayfield Citizen, ED Scribe. This patient was seen in room APFT22/APFT22 and the patient's care was started at 2:34 PM.   Chief Complaint  Patient presents with  . Cough   Patient is a 53 y.o. female presenting with cough. The history is provided by the patient. No language interpreter was used.  Cough Cough characteristics:  Productive Sputum characteristics:  Yellow Severity:  Moderate Onset quality:  Gradual Duration:  5 days Timing:  Intermittent Chronicity:  New Smoker: yes   Associated symptoms: headaches   Associated symptoms: no rash      HPI Comments: Darlene Wilcox is a 53 y.o. female who presents to the Emergency Department complaining of 5 days of productive cough, with associated musculoskeletal back and chest pain. Patient states she got a flu shot 6 days ago. She reports nasal congestion, headache, and nausea.  Patient denies diarrhea, constipation, vomiting, rash.   Patient has a history of emphysema and asthma. She is a .5ppd smoker and has been smoking for 15 years.    Past Medical History  Diagnosis Date  . Anxiety   . Seizures     states "silent seizures"; is on anticonvulsant; none in 2-3 mos.  . Headache(784.0)     1-2 x/month  . Asthma     daily inhaler  . GERD (gastroesophageal reflux disease)   . Full dentures   . Bipolar disorder   . Depression   . Chronic back pain greater than 3 months duration   . 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 History of ankle sprain     right; 2 weeks ago;  c/o severe pain   Past Surgical History  Procedure Laterality Date  . Cervical fusion      x 2  . Tubal ligation    . Abdominal hysterectomy      partial  . Inguinal hernia repair Right   . Bunionectomy Left   . Diagnostic laparoscopy  11/08/2006  . Laparoscopic appendectomy   11/08/2006  . Lumbar laminectomy  02/05/2007    L4-5 fusion  . Carpal tunnel release Right 01/13/2013    Procedure: CARPAL TUNNEL RELEASE;  Surgeon: Cammie Sickle., MD;  Location: [facility];  Service: Orthopedics;  Laterality: Right;  . Appendectomy    . Back surgery     Family History  Problem Relation Age of Onset  . Diabetes Maternal Grandmother    History  Substance Use Topics  . Smoking status: Current Every Day Smoker -- 0.50 packs/day for 15 years    Types: Cigarettes  . Smokeless tobacco: Never Used  . Alcohol Use: No   OB History    No data available     Review of Systems  HENT: Positive for congestion.   Respiratory: Positive for cough.   Gastrointestinal: Positive for nausea. Negative for vomiting, diarrhea and constipation.  Skin: Negative for rash.  Neurological: Positive for headaches.  All other systems reviewed and are negative.     Allergies  Apple; Penicillins; and Bee venom  Home Medications   Prior to Admission medications   Medication Sig Start Date End Date Taking? Authorizing Provider  albuterol (PROAIR HFA) 108 (90 BASE) MCG/ACT inhaler Inhale 2 puffs into the lungs every 4 (four) hours as needed for wheezing or shortness of breath. 11/03/13  Yes Lysbeth Penner, FNP  beclomethasone (QVAR) 80 MCG/ACT inhaler Inhale 2 puffs into the lungs 2 (two) times daily. 11/03/13  Yes Lysbeth Penner, FNP  carbamazepine (TEGRETOL) 200 MG tablet Take 200 mg by mouth 3 (three) times daily.   Yes Historical Provider, MD  clonazePAM (KLONOPIN) 1 MG tablet Take 1 mg by mouth 3 (three) times daily. 09/15/12  Yes Vernie Shanks, MD  esomeprazole (NEXIUM) 40 MG capsule Take 1 capsule (40 mg total) by mouth 2 (two) times daily before a meal. 12/14/13  Yes Lysbeth Penner, FNP  FLUoxetine (PROZAC) 20 MG capsule Take 60 mg by mouth daily.  03/29/13  Yes Historical Provider, MD  furosemide (LASIX) 40 MG tablet TAKE 1 TABLET (40 MG TOTAL) BY MOUTH DAILY.  01/15/14  Yes Lysbeth Penner, FNP  HYDROcodone-acetaminophen (NORCO) 10-325 MG per tablet Take 1 tablet by mouth every 8 (eight) hours as needed for moderate pain.    Yes Historical Provider, MD  imipramine (TOFRANIL) 50 MG tablet TAKE 2 TABLETS BY MOUTH AT BEDTIME 01/15/14  Yes Lysbeth Penner, FNP  methocarbamol (ROBAXIN) 500 MG tablet Take 500 mg by mouth 3 (three) times daily.    Yes Historical Provider, MD  metolazone (ZAROXOLYN) 2.5 MG tablet Take 1 tablet (2.5 mg total) by mouth daily. 10/12/13  Yes Lysbeth Penner, FNP  Multiple Vitamin (MULTIVITAMIN WITH MINERALS) TABS tablet Take 1 tablet by mouth daily.   Yes Historical Provider, MD  naproxen (NAPROSYN) 500 MG tablet Take 500 mg by mouth 2 (two) times daily with a meal.    Yes Historical Provider, MD  oxcarbazepine (TRILEPTAL) 600 MG tablet Take 600 mg by mouth at bedtime.    Yes Historical Provider, MD  pregabalin (LYRICA) 75 MG capsule Take 75 mg by mouth 2 (two) times daily.    Yes Historical Provider, MD  topiramate (TOPAMAX) 50 MG tablet Take 100 mg by mouth at bedtime. 09/15/12  Yes Vernie Shanks, MD  ziprasidone (GEODON) 80 MG capsule Take 160 mg by mouth at bedtime.  03/29/13  Yes Historical Provider, MD   BP 101/56 mmHg  Pulse 95  Temp(Src) 98.1 F (36.7 C) (Oral)  Resp 18  Ht 5\' 2"  (1.575 m)  Wt 195 lb (88.451 kg)  BMI 35.66 kg/m2  SpO2 97% Physical Exam  Constitutional: She is oriented to person, place, and time. She appears well-developed and well-nourished.  HENT:  Head: Normocephalic and atraumatic.  Right Ear: External ear normal.  Left Ear: External ear normal.  Both ears normal; Airway is patent; uvula midline  Eyes: EOM are normal. Pupils are equal, round, and reactive to light. Left eye exhibits no discharge.  Neck: No tracheal deviation (trachea midline) present.  Cardiovascular: Normal rate.   Pulmonary/Chest: Effort normal.  Scattered rhonchi; symmetrical rise and fall of the chest  Musculoskeletal:  She exhibits no edema.  Negative Homan's sign   Lymphadenopathy:    She has no cervical adenopathy.  Neurological: She is alert and oriented to person, place, and time.  Skin: Skin is warm and dry. No rash noted.  No rash in the palms  Psychiatric: She has a normal mood and affect. Her behavior is normal.  Nursing note and vitals reviewed.   ED Course  Procedures   DIAGNOSTIC STUDIES: Oxygen Saturation is 97% on RA, normal by my interpretation.    COORDINATION OF CARE: 2:43 PM Discussed treatment plan with pt at bedside, including an inhaler and a short course of steroids,  Levaquin and Norco, and pt agreed to plan.   Labs Review Labs Reviewed - No data to display  Imaging Review Dg Chest 2 View  03/29/2014   CLINICAL DATA:  Cough and chest pain.  EXAM: CHEST  2 VIEW  COMPARISON:  June 02, 2013  FINDINGS: There is subtle infiltrate in the left lower lobe. Lungs elsewhere clear. Heart size is upper normal with pulmonary vascularity within normal limits. No adenopathy. There is postoperative change in the lower cervical spine.  IMPRESSION: Rather subtle infiltrate left lower lobe.  Elsewhere lungs clear.   Electronically Signed   By: Lowella Grip M.D.   On: 03/29/2014 14:10     EKG Interpretation None      MDM  Vital signs are within normal limits. Pulse oximetry is 97% on room air. Within normal limits per my interpretation. Chest x-ray should suggest a subtle infiltrate in the left lower lobe of the lungs. Patient will be placed onNorco, Levaquin, and prednisone. Patient is given an albuterol inhaler to use. Patient is to follow-up with her primary physician next week. Patient will return to the emergency department if any emergent changes, problems, or concerns.   Final diagnoses:  Cough  Left lower lobe pneumonia    **I personally performed the services described in this documentation, which was scribed in my presence. The recorded information has been reviewed and  is accurate.* I have reviewed nursing notes, vital signs, and all appropriate lab and imaging results for this patient.     Lenox Ahr, PA-C 03/29/14 1758

## 2014-03-29 NOTE — Discharge Instructions (Signed)
Please increasewater, juices, Gatorade. Please wash hands frequently. Please use a mask until all symptoms have resolved. Use Tylenol every 4 hours, or ibuprofen every 6 hours for fever and aching. Your x-ray shows an early pneumonia on the left side. Please use Levaquin daily until all taken as well as the prednisone taper. Please use 2 puffs of albuterol every 4 hours daily. Please stops any smoking. Please use Norco every 4 hours if needed for pain. This medication may cause drowsiness, please use with caution. Please call Dr. Sallyanne Havers tomorrow to set up an appointment for next week for follow-up of your pneumonia. Pneumonia Pneumonia is an infection of the lungs.  CAUSES Pneumonia may be caused by bacteria or a virus. Usually, these infections are caused by breathing infectious particles into the lungs (respiratory tract). SIGNS AND SYMPTOMS   Cough.  Fever.  Chest pain.  Increased rate of breathing.  Wheezing.  Mucus production. DIAGNOSIS  If you have the common symptoms of pneumonia, your health care provider will typically confirm the diagnosis with a chest X-ray. The X-ray will show an abnormality in the lung (pulmonary infiltrate) if you have pneumonia. Other tests of your blood, urine, or sputum may be done to find the specific cause of your pneumonia. Your health care provider may also do tests (blood gases or pulse oximetry) to see how well your lungs are working. TREATMENT  Some forms of pneumonia may be spread to other people when you cough or sneeze. You may be asked to wear a mask before and during your exam. Pneumonia that is caused by bacteria is treated with antibiotic medicine. Pneumonia that is caused by the influenza virus may be treated with an antiviral medicine. Most other viral infections must run their course. These infections will not respond to antibiotics.  HOME CARE INSTRUCTIONS   Cough suppressants may be used if you are losing too much rest. However, coughing  protects you by clearing your lungs. You should avoid using cough suppressants if you can.  Your health care provider may have prescribed medicine if he or she thinks your pneumonia is caused by bacteria or influenza. Finish your medicine even if you start to feel better.  Your health care provider may also prescribe an expectorant. This loosens the mucus to be coughed up.  Take medicines only as directed by your health care provider.  Do not smoke. Smoking is a common cause of bronchitis and can contribute to pneumonia. If you are a smoker and continue to smoke, your cough may last several weeks after your pneumonia has cleared.  A cold steam vaporizer or humidifier in your room or home may help loosen mucus.  Coughing is often worse at night. Sleeping in a semi-upright position in a recliner or using a couple pillows under your head will help with this.  Get rest as you feel it is needed. Your body will usually let you know when you need to rest. PREVENTION A pneumococcal shot (vaccine) is available to prevent a common bacterial cause of pneumonia. This is usually suggested for:  People over 24 years old.  Patients on chemotherapy.  People with chronic lung problems, such as bronchitis or emphysema.  People with immune system problems. If you are over 65 or have a high risk condition, you may receive the pneumococcal vaccine if you have not received it before. In some countries, a routine influenza vaccine is also recommended. This vaccine can help prevent some cases of pneumonia.You may be offered the influenza  vaccine as part of your care. If you smoke, it is time to quit. You may receive instructions on how to stop smoking. Your health care provider can provide medicines and counseling to help you quit. SEEK MEDICAL CARE IF: You have a fever. SEEK IMMEDIATE MEDICAL CARE IF:   Your illness becomes worse. This is especially true if you are elderly or weakened from any other  disease.  You cannot control your cough with suppressants and are losing sleep.  You begin coughing up blood.  You develop pain which is getting worse or is uncontrolled with medicines.  Any of the symptoms which initially brought you in for treatment are getting worse rather than better.  You develop shortness of breath or chest pain. MAKE SURE YOU:   Understand these instructions.  Will watch your condition.  Will get help right away if you are not doing well or get worse. Document Released: 05/14/2005 Document Revised: 09/28/2013 Document Reviewed: 08/03/2010 [facility] Patient Information 2015 [place], [place]. This information is not intended to replace advice given to you by your health care provider. Make sure you discuss any questions you have with your health care provider.

## 2014-03-29 NOTE — ED Notes (Signed)
RT called for INH tx.

## 2014-03-29 NOTE — ED Notes (Signed)
Feels weak, cough , ribs sore.  Yellow sputum,  ? Fever.  Nausea, no vomiting or diarrha.

## 2014-03-29 NOTE — ED Notes (Signed)
Incorrect note on discharge. Pt in room awaiting PA eval.

## 2014-03-29 NOTE — ED Notes (Signed)
Signature program not working. System down.

## 2014-04-02 ENCOUNTER — Ambulatory Visit (HOSPITAL_COMMUNITY): Admission: RE | Admit: 2014-04-02 | Payer: PRIVATE HEALTH INSURANCE | Source: Ambulatory Visit

## 2014-04-02 ENCOUNTER — Ambulatory Visit (HOSPITAL_COMMUNITY)
Admission: RE | Admit: 2014-04-02 | Discharge: 2014-04-02 | Disposition: A | Discharge status: Home / Self Care | Payer: PRIVATE HEALTH INSURANCE | Source: Ambulatory Visit | Attending: Gastroenterology | Admitting: Gastroenterology

## 2014-04-02 DIAGNOSIS — J45909 Unspecified asthma, uncomplicated: Secondary | ICD-10-CM | POA: Insufficient documentation

## 2014-04-02 DIAGNOSIS — K219 Gastro-esophageal reflux disease without esophagitis: Secondary | ICD-10-CM | POA: Diagnosis not present

## 2014-04-02 DIAGNOSIS — R569 Unspecified convulsions: Secondary | ICD-10-CM | POA: Insufficient documentation

## 2014-04-02 DIAGNOSIS — R1314 Dysphagia, pharyngoesophageal phase: Secondary | ICD-10-CM

## 2014-04-02 DIAGNOSIS — R1312 Dysphagia, oropharyngeal phase: Secondary | ICD-10-CM | POA: Insufficient documentation

## 2014-04-02 DIAGNOSIS — R1310 Dysphagia, unspecified: Secondary | ICD-10-CM

## 2014-04-02 NOTE — Procedures (Signed)
Objective Swallowing Evaluation: Modified Barium Swallowing Study  Patient Details  Name: Darlene Wilcox MRN: [id number] Date of Birth: 24-Jul-1960  Today's Date: 04/02/2014 Time: 1315-1400 SLP Time Calculation (min): 45 min  Past Medical History:  Past Medical History  Diagnosis Date  . Anxiety   . Seizures     states "silent seizures"; is on anticonvulsant; none in 2-3 mos.  . Headache(784.0)     1-2 x/month  . Asthma     daily inhaler  . GERD (gastroesophageal reflux disease)   . Full dentures   . Bipolar disorder   . Depression   . Chronic back pain greater than 3 months duration   . 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 History of ankle sprain     right; 2 weeks ago;  c/o severe pain   Past Surgical History:  Past Surgical History  Procedure Laterality Date  . Cervical fusion      x 2  . Tubal ligation    . Abdominal hysterectomy      partial  . Inguinal hernia repair Right   . Bunionectomy Left   . Diagnostic laparoscopy  11/08/2006  . Laparoscopic appendectomy  11/08/2006  . Lumbar laminectomy  02/05/2007    L4-5 fusion  . Carpal tunnel release Right 01/13/2013    Procedure: CARPAL TUNNEL RELEASE;  Surgeon: Cammie Sickle., MD;  Location: [place] [facility];  Service: Orthopedics;  Laterality: Right;  . Appendectomy    . Back surgery     HPI:  Pt is a 53 yo female referred by Dr Fuller Plan for MBS due to concerns she may be having oropharyngeal dysphagia.  Pt PMH + for smoking, GERD, anxiety, seizures, asthma, headaches, depression, chronic back pain.  Surgical hx includes cervical fusion x1 - per pt in 2003/2004 - without noticable dysphagia after per pt.  Pt medication list includes but not limited to - Topamax, Nexium, Lasix, Klonopin, Proair, Tegretol, Prozac, Robaxin, Hydrocodone-acetaminophen.  Pt reports dysphagia to solids and liquids - sensation of choking with residuals in pharynx.  She states she required the heimlich manuever many years  ago while consuming a meat ball but has not recently.  Pt denies weight loss but admits to recent hospital visit for asthma and states she was informed she had pna.       Assessment / Plan / Recommendation Clinical Impression  Dysphagia Diagnosis: Mild pharyngeal phase dysphagia;Mild oral phase dysphagia Clinical impression: Mild oropharyngeal dysphagia characterized by delayed oral propulsion (pt reports xerostomia).  Epiglottic deflection compromised allowing vallecular residuals across consistencies with intermittent pt awareness.  Various postures attempted to prevent/decrease residuals with most effective being following solids with liquids, effortful swallow, dry swallows and expectoration as needed. Pt with laryngeal penetration of thin/nectar due to decreased laryngeal elevation but this was cleared with cued throat clearing.    Pt admits to eating "like someone is going to take it away" and need to slow rate to prevent/decrease symptoms.  No coughing noted during MBS and use of live video, verbal feedback helpful to reinforce precautions to pt.  SLP questions source of dysphagia and recommend consider examination of medication as some of her medications may contribute to dysphagia.    Provided pt with written/verbal exercises/aspiration precautions using teach back for confirmation.  Thanks for this referral.      Treatment Recommendation  No treatment recommended at this time    Diet Recommendation Regular;Thin liquid   Liquid Administration via: Cup Medication Administration: Whole  meds with liquid (consider crushed if of any size) Supervision: Patient able to self feed Compensations: Slow rate;Small sips/bites;Multiple dry swallows after each bite/sip;Follow solids with liquid;Effortful swallow;Clear throat intermittently (start meal with liquids) Postural Changes and/or Swallow Maneuvers: Seated upright 90 degrees;Upright 30-60 min after meal    Other  Recommendations Recommended  Consults:  (pharmacist consult to assess for medications that cause dysphagia ) Oral Care Recommendations: Oral care BID   Follow Up Recommendations  None      General Date of Onset: 04/02/14 HPI: Pt is a 53 yo female referred by Dr Fuller Plan for MBS due to concerns she may be having oropharyngeal dysphagia.  Pt PMH + for smoking, GERD, anxiety, seizures, asthma, headaches, depression, chronic back pain.  Surgical hx includes cervical fusion x1 - per pt in 2003/2004 - without noticable dysphagia after per pt.  Pt medication list includes but not limited to - Topamax, Nexium, Lasix, Klonopin, Proair, Tegretol, Prozac, Robaxin, Hydrocodone-acetaminophen.  Pt reports dysphagia to solids and liquids - sensation of choking with residuals in pharynx.  She states she required the heimlich manuever many years ago while consuming a meat ball but has not recently.  Pt denies weight loss but admits to recent hospital visit for asthma and states she was informed she had pna.   Type of Study: Modified Barium Swallowing Study Reason for Referral: Objectively evaluate swallowing function Diet Prior to this Study: Regular;Thin liquids Temperature Spikes Noted: No Respiratory Status: Room air Behavior/Cognition: Alert;Cooperative;Pleasant mood Oral Cavity - Dentition: Dentures, top;Dentures, bottom Oral Motor / Sensory Function: Within functional limits Self-Feeding Abilities: Able to feed self Patient Positioning: Upright in bed Baseline Vocal Quality: Hoarse Volitional Cough: Strong (with expectoration of yellow tinged secretions ) Volitional Swallow: Able to elicit Anatomy: Within functional limits (note hardware from ACDF) Pharyngeal Secretions: Not observed secondary MBS    Reason for Referral Objectively evaluate swallowing function   Oral Phase Oral Preparation/Oral Phase Oral Phase: Impaired Oral - Nectar Oral - Nectar Cup: Reduced posterior propulsion;Piecemeal swallowing Oral - Thin Oral - Thin  Cup: Reduced posterior propulsion;Piecemeal swallowing Oral - Thin Straw: Reduced posterior propulsion;Piecemeal swallowing Oral - Solids Oral - Puree: Reduced posterior propulsion;Piecemeal swallowing Oral - Regular: Reduced posterior propulsion;Piecemeal swallowing Oral - Pill: Reduced posterior propulsion   Pharyngeal Phase Pharyngeal Phase Pharyngeal Phase: Impaired Pharyngeal - Nectar Pharyngeal - Nectar Cup: Reduced epiglottic inversion;Pharyngeal residue - valleculae;Penetration/Aspiration during swallow;Reduced laryngeal elevation;Reduced airway/laryngeal closure Penetration/Aspiration details (nectar cup): Material enters airway, remains ABOVE vocal cords and not ejected out Pharyngeal - Thin Pharyngeal - Thin Cup: Reduced epiglottic inversion;Pharyngeal residue - valleculae;Penetration/Aspiration during swallow;Reduced laryngeal elevation;Reduced airway/laryngeal closure Penetration/Aspiration details (thin cup): Material enters airway, CONTACTS cords and not ejected out Pharyngeal - Thin Straw: Reduced epiglottic inversion;Pharyngeal residue - valleculae;Penetration/Aspiration during swallow;Reduced laryngeal elevation;Reduced airway/laryngeal closure Penetration/Aspiration details (thin straw): Material enters airway, remains ABOVE vocal cords and not ejected out Pharyngeal - Solids Pharyngeal - Puree: Reduced epiglottic inversion;Pharyngeal residue - valleculae;Reduced laryngeal elevation;Reduced airway/laryngeal closure Pharyngeal - Regular: Reduced epiglottic inversion;Pharyngeal residue - valleculae;Reduced laryngeal elevation;Reduced airway/laryngeal closure Pharyngeal - Pill: Reduced epiglottic inversion;Reduced laryngeal elevation;Reduced airway/laryngeal closure Pharyngeal Phase - Comment Pharyngeal Comment: head turn not effective, chin tuck not tested due to pt's limited ROM, breath hold effective to decrease amount of penetration, cued throat clearing/cough effectively  removed trace penetrates, dry and liquid swallows help to decrease amount of pharyngeal stasis, pt cough/expectoration decreased pharyngeal residuals of solids  Cervical Esophageal Phase    GO    Cervical Esophageal  Phase Cervical Esophageal Phase:  (barium tablet given with thin liquid appeared to transit quickly into stomach without delay, appearance of minimal delayed clearance at mid-esophagus - clearing with intake of water)    Functional Assessment Tool Used: mbs, clinical judgement Functional Limitations: Swallowing Swallow Current Status ([id number]): At least 1 percent but less than 20 percent impaired, limited or restricted Swallow Goal Status [phone]): At least 1 percent but less than 20 percent impaired, limited or restricted Swallow Discharge Status [phone]): At least 1 percent but less than 20 percent impaired, limited or restricted    Luanna Salk, [facility] SLP [phone]

## 2014-04-05 ENCOUNTER — Ambulatory Visit: Payer: Medicare Other | Admitting: Family Medicine

## 2014-04-06 ENCOUNTER — Telehealth: Payer: Self-pay | Admitting: Gastroenterology

## 2014-04-06 ENCOUNTER — Encounter: Payer: Self-pay | Admitting: Family Medicine

## 2014-04-06 ENCOUNTER — Other Ambulatory Visit: Payer: Self-pay | Admitting: *Deleted

## 2014-04-06 ENCOUNTER — Ambulatory Visit (INDEPENDENT_AMBULATORY_CARE_PROVIDER_SITE_OTHER): Payer: Medicare Other | Admitting: Family Medicine

## 2014-04-06 ENCOUNTER — Telehealth: Payer: Self-pay | Admitting: Family Medicine

## 2014-04-06 VITALS — BP 112/70 | HR 111 | Temp 98.8°F | Ht 62.0 in | Wt 197.6 lb

## 2014-04-06 DIAGNOSIS — J206 Acute bronchitis due to rhinovirus: Secondary | ICD-10-CM

## 2014-04-06 MED ORDER — LEVOFLOXACIN 500 MG PO TABS
500.0000 mg | ORAL_TABLET | Freq: Every day | ORAL | Status: DC
Start: 2014-04-06 — End: 2014-04-20

## 2014-04-06 NOTE — Progress Notes (Signed)
   Subjective:    Patient ID: Darlene Wilcox, female    DOB: 24-Nov-1960, 53 y.o.   MRN: [id number]  HPI Patient is here for follow up.  She was recently seen at [facility] ED for bronchitis.  She was rx'd levaquin and norco.   Review of Systems  Constitutional: Negative for fever.  HENT: Negative for ear pain.   Eyes: Negative for discharge.  Respiratory: Negative for cough.   Cardiovascular: Negative for chest pain.  Gastrointestinal: Negative for abdominal distention.  Endocrine: Negative for polyuria.  Genitourinary: Negative for difficulty urinating.  Musculoskeletal: Negative for gait problem and neck pain.  Skin: Negative for color change and rash.  Neurological: Negative for speech difficulty and headaches.  Psychiatric/Behavioral: Negative for agitation.       Objective:    BP 112/70 mmHg  Pulse 111  Temp(Src) 98.8 F (37.1 C) (Oral)  Ht 5\' 2"  (1.575 m)  Wt 197 lb 9.6 oz (89.631 kg)  BMI 36.13 kg/m2 Physical Exam  Constitutional: She is oriented to person, place, and time. She appears well-developed and well-nourished.  HENT:  Head: Normocephalic and atraumatic.  Mouth/Throat: Oropharynx is clear and moist.  Eyes: Pupils are equal, round, and reactive to light.  Neck: Normal range of motion. Neck supple.  Cardiovascular: Normal rate and regular rhythm.   No murmur heard. Pulmonary/Chest: Effort normal and breath sounds normal.  Abdominal: Soft. Bowel sounds are normal. There is no tenderness.  Neurological: She is alert and oriented to person, place, and time.  Skin: Skin is warm and dry.  Psychiatric: She has a normal mood and affect.          Assessment & Plan:     ICD-9-CM ICD-10-CM   1. Acute bronchitis due to Rhinovirus 466.0 J20.6    079.3     Push po fluids, rest, tylenol and motrin otc prn as directed for fever, arthralgias, and myalgias.  Follow up prn if sx's continue or persist.  Return if symptoms worsen or fail to  improve.  Lysbeth Penner FNP

## 2014-04-06 NOTE — Telephone Encounter (Signed)
Aware, levaquin sent to pharmacy.  Can try OTC cough syrup.

## 2014-04-06 NOTE — Progress Notes (Signed)
Continue levaquin per Dietrich Pates Rx sent into pharmacy

## 2014-04-07 NOTE — Telephone Encounter (Signed)
A user error has taken place.

## 2014-04-09 ENCOUNTER — Encounter (HOSPITAL_COMMUNITY): Payer: Self-pay | Admitting: Emergency Medicine

## 2014-04-09 ENCOUNTER — Emergency Department (HOSPITAL_COMMUNITY)
Admission: EM | Admit: 2014-04-09 | Discharge: 2014-04-09 | Disposition: A | Discharge status: Home / Self Care | Payer: PRIVATE HEALTH INSURANCE | Attending: Emergency Medicine | Admitting: Emergency Medicine

## 2014-04-09 ENCOUNTER — Emergency Department (HOSPITAL_COMMUNITY): Payer: PRIVATE HEALTH INSURANCE

## 2014-04-09 DIAGNOSIS — J45909 Unspecified asthma, uncomplicated: Secondary | ICD-10-CM | POA: Insufficient documentation

## 2014-04-09 DIAGNOSIS — Z79899 Other long term (current) drug therapy: Secondary | ICD-10-CM | POA: Diagnosis not present

## 2014-04-09 DIAGNOSIS — Z792 Long term (current) use of antibiotics: Secondary | ICD-10-CM | POA: Diagnosis not present

## 2014-04-09 DIAGNOSIS — F419 Anxiety disorder, unspecified: Secondary | ICD-10-CM | POA: Insufficient documentation

## 2014-04-09 DIAGNOSIS — R51 Headache: Secondary | ICD-10-CM | POA: Diagnosis not present

## 2014-04-09 DIAGNOSIS — Z7951 Long term (current) use of inhaled steroids: Secondary | ICD-10-CM | POA: Insufficient documentation

## 2014-04-09 DIAGNOSIS — R0789 Other chest pain: Secondary | ICD-10-CM | POA: Insufficient documentation

## 2014-04-09 DIAGNOSIS — G8929 Other chronic pain: Secondary | ICD-10-CM | POA: Insufficient documentation

## 2014-04-09 DIAGNOSIS — Z8701 Personal history of pneumonia (recurrent): Secondary | ICD-10-CM | POA: Insufficient documentation

## 2014-04-09 DIAGNOSIS — F319 Bipolar disorder, unspecified: Secondary | ICD-10-CM | POA: Insufficient documentation

## 2014-04-09 DIAGNOSIS — G40909 Epilepsy, unspecified, not intractable, without status epilepticus: Secondary | ICD-10-CM | POA: Diagnosis not present

## 2014-04-09 DIAGNOSIS — Z87828 Personal history of other (healed) physical injury and trauma: Secondary | ICD-10-CM | POA: Insufficient documentation

## 2014-04-09 DIAGNOSIS — Z880 Allergy status to penicillin: Secondary | ICD-10-CM | POA: Insufficient documentation

## 2014-04-09 DIAGNOSIS — Z98811 Dental restoration status: Secondary | ICD-10-CM | POA: Diagnosis not present

## 2014-04-09 DIAGNOSIS — Z791 Long term (current) use of non-steroidal anti-inflammatories (NSAID): Secondary | ICD-10-CM | POA: Diagnosis not present

## 2014-04-09 DIAGNOSIS — Z720 Tobacco use: Secondary | ICD-10-CM | POA: Insufficient documentation

## 2014-04-09 DIAGNOSIS — R079 Chest pain, unspecified: Secondary | ICD-10-CM

## 2014-04-09 DIAGNOSIS — R52 Pain, unspecified: Secondary | ICD-10-CM | POA: Diagnosis present

## 2014-04-09 DIAGNOSIS — K219 Gastro-esophageal reflux disease without esophagitis: Secondary | ICD-10-CM | POA: Diagnosis not present

## 2014-04-09 LAB — CBC WITH DIFFERENTIAL/PLATELET
Basophils Absolute: 0 10*3/uL (ref 0.0–0.1)
Basophils Relative: 0 % (ref 0–1)
Eosinophils Absolute: 0.2 10*3/uL (ref 0.0–0.7)
Eosinophils Relative: 2 % (ref 0–5)
HEMATOCRIT: 37.4 % (ref 36.0–46.0)
Hemoglobin: 12.7 g/dL (ref 12.0–15.0)
Lymphocytes Relative: 30 % (ref 12–46)
Lymphs Abs: 2.7 10*3/uL (ref 0.7–4.0)
MCH: 29.5 pg (ref 26.0–34.0)
MCHC: 34 g/dL (ref 30.0–36.0)
MCV: 86.8 fL (ref 78.0–100.0)
Monocytes Absolute: 0.7 10*3/uL (ref 0.1–1.0)
Monocytes Relative: 8 % (ref 3–12)
NEUTROS ABS: 5.4 10*3/uL (ref 1.7–7.7)
Neutrophils Relative %: 60 % (ref 43–77)
Platelets: 296 10*3/uL (ref 150–400)
RBC: 4.31 MIL/uL (ref 3.87–5.11)
RDW: 14.2 % (ref 11.5–15.5)
WBC: 9 10*3/uL (ref 4.0–10.5)

## 2014-04-09 LAB — BASIC METABOLIC PANEL
ANION GAP: 11 (ref 5–15)
BUN: 10 mg/dL (ref 6–23)
CALCIUM: 9.1 mg/dL (ref 8.4–10.5)
CHLORIDE: 102 meq/L (ref 96–112)
CO2: 26 mEq/L (ref 19–32)
Creatinine, Ser: 0.85 mg/dL (ref 0.50–1.10)
GFR calc Af Amer: >90 mL/min (ref >90)
GFR calc non Af Amer: 78 mL/min — ABNORMAL LOW (ref >90)
Glucose, Bld: 100 mg/dL — ABNORMAL HIGH (ref 70–99)
Potassium: 4.3 mEq/L (ref 3.7–5.3)
Sodium: 139 mEq/L (ref 137–147)

## 2014-04-09 MED ORDER — KETOROLAC TROMETHAMINE 30 MG/ML IJ SOLN
30.0000 mg | Freq: Once | INTRAMUSCULAR | Status: AC
  Administered 2014-04-09: 30 mg via INTRAVENOUS
  Filled 2014-04-09: qty 1

## 2014-04-09 MED ORDER — HYDROMORPHONE HCL 1 MG/ML IJ SOLN
1.0000 mg | Freq: Once | INTRAMUSCULAR | Status: AC
  Administered 2014-04-09: 1 mg via INTRAVENOUS
  Filled 2014-04-09: qty 1

## 2014-04-09 NOTE — ED Notes (Signed)
Discharge instructions given, pt demonstrated teach back and verbal understanding. No concerns voiced.  

## 2014-04-09 NOTE — ED Notes (Signed)
Pt reports seen for same and diagnosed with pneumonia and flu. Pt reports no improvement in symptoms and reports continued generalized body aches. nad noted.

## 2014-04-09 NOTE — ED Provider Notes (Signed)
CSN: [id number]     Arrival date & time 04/09/14  1300 History  This chart was scribed for Hoy Morn, MD by Tula Nakayama, ED Scribe. This patient was seen in room APA10/APA10 and the patient's care was started at 5:16 PM.   Chief Complaint  Patient presents with  . Generalized Body Aches   The history is provided by the patient. No language interpreter was used.    HPI Comments: Darlene Wilcox is a 53 y.o. female who presents to the Emergency Department complaining of constant left-sided rib pain and generalized body aches that started a few days ago. She states SOB as an associated symptom. Pt came to the ED with flu symptoms on 11/2 and was diagnosed with pneumonia on her left lung. She has taken 2 doses of Levaquin with no relief. She notes relieved flu symptoms, but states that left-sided pain has increased within the last few days. She denies fevers, chill, nausea and vomiting as associated symptoms.  Past Medical History  Diagnosis Date  . Anxiety   . Seizures     states "silent seizures"; is on anticonvulsant; none in 2-3 mos.  . Headache(784.0)     1-2 x/month  . Asthma     daily inhaler  . GERD (gastroesophageal reflux disease)   . Full dentures   . Bipolar disorder   . Depression   . Chronic back pain greater than 3 months duration   . 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 History of ankle sprain     right; 2 weeks ago;  c/o severe pain   Past Surgical History  Procedure Laterality Date  . Cervical fusion      x 2  . Tubal ligation    . Abdominal hysterectomy      partial  . Inguinal hernia repair Right   . Bunionectomy Left   . Diagnostic laparoscopy  11/08/2006  . Laparoscopic appendectomy  11/08/2006  . Lumbar laminectomy  02/05/2007    L4-5 fusion  . Carpal tunnel release Right 01/13/2013    Procedure: CARPAL TUNNEL RELEASE;  Surgeon: Cammie Sickle., MD;  Location: [place];  Service: Orthopedics;  Laterality: Right;  .  Appendectomy    . Back surgery     Family History  Problem Relation Age of Onset  . Diabetes Maternal Grandmother    History  Substance Use Topics  . Smoking status: Current Every Day Smoker -- 0.50 packs/day for 15 years    Types: Cigarettes  . Smokeless tobacco: Never Used  . Alcohol Use: No   OB History    No data available     Review of Systems  10 Systems reviewed and all are negative for acute change except as noted in the HPI.   Allergies  Apple; Penicillins; and Bee venom  Home Medications   Prior to Admission medications   Medication Sig Start Date End Date Taking? Authorizing Provider  albuterol (PROAIR HFA) 108 (90 BASE) MCG/ACT inhaler Inhale 2 puffs into the lungs every 4 (four) hours as needed for wheezing or shortness of breath. 11/03/13  Yes Lysbeth Penner, FNP  beclomethasone (QVAR) 80 MCG/ACT inhaler Inhale 2 puffs into the lungs 2 (two) times daily. 11/03/13  Yes Lysbeth Penner, FNP  carbamazepine (TEGRETOL) 200 MG tablet Take 200 mg by mouth 3 (three) times daily.   Yes Historical Provider, MD  clonazePAM (KLONOPIN) 1 MG tablet Take 1 mg by mouth 3 (three) times daily.  09/15/12  Yes Vernie Shanks, MD  esomeprazole (NEXIUM) 40 MG capsule Take 1 capsule (40 mg total) by mouth 2 (two) times daily before a meal. 12/14/13  Yes Lysbeth Penner, FNP  FLUoxetine (PROZAC) 20 MG capsule Take 60 mg by mouth daily.  03/29/13  Yes Historical Provider, MD  gemfibrozil (LOPID) 600 MG tablet Take 600 mg by mouth 2 (two) times daily before a meal.  03/02/14  Yes Historical Provider, MD  HYDROcodone-acetaminophen (NORCO/VICODIN) 5-325 MG per tablet Take 1 tablet by mouth every 4 (four) hours as needed. 03/29/14  Yes Lenox Ahr, PA-C  imipramine (TOFRANIL) 50 MG tablet TAKE 2 TABLETS BY MOUTH AT BEDTIME 01/15/14  Yes Lysbeth Penner, FNP  levofloxacin (LEVAQUIN) 500 MG tablet Take 1 tablet (500 mg total) by mouth daily. 04/06/14  Yes Lysbeth Penner, FNP  methocarbamol  (ROBAXIN) 500 MG tablet Take 500 mg by mouth 3 (three) times daily.    Yes Historical Provider, MD  Multiple Vitamin (MULTIVITAMIN WITH MINERALS) TABS tablet Take 1 tablet by mouth daily.   Yes Historical Provider, MD  naproxen (NAPROSYN) 500 MG tablet Take 500 mg by mouth 2 (two) times daily with a meal.    Yes Historical Provider, MD  oxcarbazepine (TRILEPTAL) 600 MG tablet Take 600 mg by mouth at bedtime.    Yes Historical Provider, MD  pregabalin (LYRICA) 150 MG capsule Take 150 mg by mouth 2 (two) times daily.   Yes Historical Provider, MD  topiramate (TOPAMAX) 200 MG tablet Take 200 mg by mouth at bedtime. 03/31/14  Yes Historical Provider, MD  ziprasidone (GEODON) 80 MG capsule Take 160 mg by mouth at bedtime.  03/29/13  Yes Historical Provider, MD  baclofen (LIORESAL) 10 MG tablet  01/06/14   Historical Provider, MD  furosemide (LASIX) 40 MG tablet TAKE 1 TABLET (40 MG TOTAL) BY MOUTH DAILY. 01/15/14   Lysbeth Penner, FNP  metolazone (ZAROXOLYN) 2.5 MG tablet Take 1 tablet (2.5 mg total) by mouth daily. 10/12/13   Lysbeth Penner, FNP  pregabalin (LYRICA) 75 MG capsule Take 75 mg by mouth 2 (two) times daily.     Historical Provider, MD  tiZANidine (ZANAFLEX) 4 MG tablet  03/24/14   Historical Provider, MD  topiramate (TOPAMAX) 50 MG tablet Take 100 mg by mouth at bedtime. 09/15/12   Vernie Shanks, MD   BP 106/71 mmHg  Pulse 92  Temp(Src) 98.4 F (36.9 C) (Oral)  Resp 18  Ht 5\' 2"  (1.575 m)  Wt 192 lb (87.091 kg)  BMI 35.11 kg/m2  SpO2 99% Physical Exam  Constitutional: She is oriented to person, place, and time. She appears well-developed and well-nourished. No distress.  HENT:  Head: Normocephalic and atraumatic.  Eyes: EOM are normal.  Neck: Normal range of motion.  Cardiovascular: Normal rate, regular rhythm and normal heart sounds.   Pulmonary/Chest: Effort normal and breath sounds normal.  Abdominal: Soft. She exhibits no distension. There is no tenderness.   Musculoskeletal: Normal range of motion. She exhibits no tenderness.  Left-sided pleuritic pain, no tenderness  Neurological: She is alert and oriented to person, place, and time.  Skin: Skin is warm and dry.  Psychiatric: She has a normal mood and affect. Judgment normal.  Nursing note and vitals reviewed.   ED Course  Procedures (including critical care time) DIAGNOSTIC STUDIES: Oxygen Saturation is 99% on RA, normal by my interpretation.    COORDINATION OF CARE: 5:22 PM Discussed treatment plan with pt at bedside  and pt agreed to plan.  6:59 PM Discussed test results with pt. Advised pt to drink a lot of fluids and finish Levaquin treatment. Will send home with anti-inflammatories.  Labs Review Labs Reviewed  BASIC METABOLIC PANEL - Abnormal; Notable for the following:    Glucose, Bld 100 (*)    GFR calc non Af Amer 78 (*)    All other components within normal limits  CBC WITH DIFFERENTIAL    Imaging Review Dg Chest 2 View  04/09/2014   CLINICAL DATA:  Sob, left sided chest pain, productive cough- yellow sputum, generalized body aches x 2 weeks. Stated diagnosed on 11-2 with pneumonia. History of asthma, GERD. ICD10: R 7.9  EXAM: CHEST  2 VIEW  COMPARISON:  03/29/2014  FINDINGS: Lower cervical spine fixation. Midline trachea. Normal heart size and mediastinal contours. No pleural effusion or pneumothorax. Superior segment left lower lobe airspace disease has resolved. Clear lungs.  IMPRESSION: No acute cardiopulmonary disease.   Electronically Signed   By: Abigail Miyamoto M.D.   On: 04/09/2014 18:23  I personally reviewed the imaging tests through PACS system I reviewed available ER/hospitalization records through the EMR    EKG Interpretation None      MDM   Final diagnoses:  Left sided chest pain    Suspect pleurisy from recent infection.  Doubt pulmonary embolism.  Patient feels much better after short course of pain medicine.  I personally performed the services  described in this documentation, which was scribed in my presence. The recorded information has been reviewed and is accurate.    Medications  ketorolac (TORADOL) 30 MG/ML injection 30 mg (30 mg Intravenous Given 04/09/14 1751)  HYDROmorphone (DILAUDID) injection 1 mg (1 mg Intravenous Given 04/09/14 1751)        Hoy Morn, MD 04/09/14 [place]

## 2014-04-10 ENCOUNTER — Emergency Department (HOSPITAL_COMMUNITY)
Admission: EM | Admit: 2014-04-10 | Discharge: 2014-04-10 | Disposition: A | Discharge status: Home / Self Care | Payer: PRIVATE HEALTH INSURANCE | Attending: Emergency Medicine | Admitting: Emergency Medicine

## 2014-04-10 ENCOUNTER — Encounter (HOSPITAL_COMMUNITY): Payer: Self-pay

## 2014-04-10 ENCOUNTER — Emergency Department (HOSPITAL_COMMUNITY): Payer: PRIVATE HEALTH INSURANCE

## 2014-04-10 DIAGNOSIS — Z7951 Long term (current) use of inhaled steroids: Secondary | ICD-10-CM | POA: Diagnosis not present

## 2014-04-10 DIAGNOSIS — X58XXXA Exposure to other specified factors, initial encounter: Secondary | ICD-10-CM | POA: Diagnosis not present

## 2014-04-10 DIAGNOSIS — R05 Cough: Secondary | ICD-10-CM | POA: Diagnosis not present

## 2014-04-10 DIAGNOSIS — Z87828 Personal history of other (healed) physical injury and trauma: Secondary | ICD-10-CM | POA: Insufficient documentation

## 2014-04-10 DIAGNOSIS — R000 Tachycardia, unspecified: Secondary | ICD-10-CM | POA: Diagnosis not present

## 2014-04-10 DIAGNOSIS — R52 Pain, unspecified: Secondary | ICD-10-CM

## 2014-04-10 DIAGNOSIS — J45909 Unspecified asthma, uncomplicated: Secondary | ICD-10-CM | POA: Diagnosis not present

## 2014-04-10 DIAGNOSIS — F419 Anxiety disorder, unspecified: Secondary | ICD-10-CM | POA: Insufficient documentation

## 2014-04-10 DIAGNOSIS — Z8701 Personal history of pneumonia (recurrent): Secondary | ICD-10-CM | POA: Diagnosis not present

## 2014-04-10 DIAGNOSIS — Z98811 Dental restoration status: Secondary | ICD-10-CM | POA: Diagnosis not present

## 2014-04-10 DIAGNOSIS — Z720 Tobacco use: Secondary | ICD-10-CM | POA: Insufficient documentation

## 2014-04-10 DIAGNOSIS — Z79899 Other long term (current) drug therapy: Secondary | ICD-10-CM | POA: Insufficient documentation

## 2014-04-10 DIAGNOSIS — F319 Bipolar disorder, unspecified: Secondary | ICD-10-CM | POA: Insufficient documentation

## 2014-04-10 DIAGNOSIS — Y998 Other external cause status: Secondary | ICD-10-CM | POA: Insufficient documentation

## 2014-04-10 DIAGNOSIS — G40909 Epilepsy, unspecified, not intractable, without status epilepticus: Secondary | ICD-10-CM | POA: Diagnosis not present

## 2014-04-10 DIAGNOSIS — G8929 Other chronic pain: Secondary | ICD-10-CM | POA: Insufficient documentation

## 2014-04-10 DIAGNOSIS — Z880 Allergy status to penicillin: Secondary | ICD-10-CM | POA: Diagnosis not present

## 2014-04-10 DIAGNOSIS — Y9389 Activity, other specified: Secondary | ICD-10-CM | POA: Insufficient documentation

## 2014-04-10 DIAGNOSIS — R059 Cough, unspecified: Secondary | ICD-10-CM

## 2014-04-10 DIAGNOSIS — S299XXA Unspecified injury of thorax, initial encounter: Secondary | ICD-10-CM | POA: Insufficient documentation

## 2014-04-10 DIAGNOSIS — Y9289 Other specified places as the place of occurrence of the external cause: Secondary | ICD-10-CM | POA: Insufficient documentation

## 2014-04-10 DIAGNOSIS — R109 Unspecified abdominal pain: Secondary | ICD-10-CM | POA: Diagnosis present

## 2014-04-10 LAB — URINALYSIS, ROUTINE W REFLEX MICROSCOPIC
BILIRUBIN URINE: NEGATIVE
GLUCOSE, UA: NEGATIVE mg/dL
HGB URINE DIPSTICK: NEGATIVE
KETONES UR: NEGATIVE mg/dL
Leukocytes, UA: NEGATIVE
NITRITE: NEGATIVE
Protein, ur: NEGATIVE mg/dL
SPECIFIC GRAVITY, URINE: 1.025 (ref 1.005–1.030)
Urobilinogen, UA: 0.2 mg/dL (ref 0.0–1.0)
pH: 5 (ref 5.0–8.0)

## 2014-04-10 MED ORDER — HYDROCOD POLST-CHLORPHEN POLST 10-8 MG/5ML PO LQCR: 5.0000 mL | Freq: Two times a day (BID) | ORAL | Status: DC | PRN

## 2014-04-10 NOTE — ED Notes (Signed)
Patient states she was coughing today when she "felt something pop" on her left side, Patient c/o of 10/10 pain. Patient was seen yesterday.

## 2014-04-10 NOTE — ED Provider Notes (Signed)
CSN: [id number]     Arrival date & time 04/10/14  1229 History   First MD Initiated Contact with Patient 04/10/14 1524     Chief Complaint  Patient presents with  . Flank Pain     (Consider location/radiation/quality/duration/timing/severity/associated sxs/prior Treatment) HPI Darlene Wilcox is a 53 y.o. female who presents to the ED for left rib pain that started suddenly after she coughed hard today. She rates the pain as 10/10. patient was evaluated here yesterday for cough and congestion and flu like symptoms. She states she was feeling much better today until she sat down in her chair and started coughing and felt something pop in the left rib area. She continues to have pain that is worse with movement, cough and deep breath. She was treated earlier this month for pneumonia. States she felt the best today that she has in a few weeks until she coughed and felt the pain in her ribs.    Past Medical History  Diagnosis Date  . Anxiety   . Seizures     states "silent seizures"; is on anticonvulsant; none in 2-3 mos.  . Headache(784.0)     1-2 x/month  . Asthma     daily inhaler  . GERD (gastroesophageal reflux disease)   . Full dentures   . Bipolar disorder   . Depression   . Chronic back pain greater than 3 months duration   . 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 History of ankle sprain     right; 2 weeks ago;  c/o severe pain   Past Surgical History  Procedure Laterality Date  . Cervical fusion      x 2  . Tubal ligation    . Abdominal hysterectomy      partial  . Inguinal hernia repair Right   . Bunionectomy Left   . Diagnostic laparoscopy  11/08/2006  . Laparoscopic appendectomy  11/08/2006  . Lumbar laminectomy  02/05/2007    L4-5 fusion  . Carpal tunnel release Right 01/13/2013    Procedure: CARPAL TUNNEL RELEASE;  Surgeon: Cammie Sickle., MD;  Location: [place];  Service: Orthopedics;  Laterality: Right;  . Appendectomy    . Back  surgery     Family History  Problem Relation Age of Onset  . Diabetes Maternal Grandmother    History  Substance Use Topics  . Smoking status: Current Every Day Smoker -- 0.50 packs/day for 15 years    Types: Cigarettes  . Smokeless tobacco: Never Used  . Alcohol Use: No   OB History    No data available     Review of Systems Negative except as stated in HPI   Allergies  Apple; Penicillins; and Bee venom  Home Medications   Prior to Admission medications   Medication Sig Start Date End Date Taking? Authorizing Provider  albuterol (PROAIR HFA) 108 (90 BASE) MCG/ACT inhaler Inhale 2 puffs into the lungs every 4 (four) hours as needed for wheezing or shortness of breath. 11/03/13  Yes Lysbeth Penner, FNP  baclofen (LIORESAL) 10 MG tablet Take 10 mg by mouth 3 (three) times daily.  01/06/14  Yes Historical Provider, MD  beclomethasone (QVAR) 80 MCG/ACT inhaler Inhale 2 puffs into the lungs 2 (two) times daily. 11/03/13  Yes Lysbeth Penner, FNP  carbamazepine (TEGRETOL) 200 MG tablet Take 200 mg by mouth 3 (three) times daily.   Yes Historical Provider, MD  clonazePAM (KLONOPIN) 1 MG tablet Take 1 mg  by mouth 3 (three) times daily. 09/15/12  Yes Vernie Shanks, MD  esomeprazole (NEXIUM) 40 MG capsule Take 1 capsule (40 mg total) by mouth 2 (two) times daily before a meal. 12/14/13  Yes Lysbeth Penner, FNP  FLUoxetine (PROZAC) 20 MG capsule Take 60 mg by mouth daily.  03/29/13  Yes Historical Provider, MD  furosemide (LASIX) 40 MG tablet Take 40 mg by mouth daily.   Yes Historical Provider, MD  gemfibrozil (LOPID) 600 MG tablet Take 600 mg by mouth 2 (two) times daily before a meal.  03/02/14  Yes Historical Provider, MD  levofloxacin (LEVAQUIN) 500 MG tablet Take 1 tablet (500 mg total) by mouth daily. 04/06/14  Yes Lysbeth Penner, FNP  methocarbamol (ROBAXIN) 500 MG tablet Take 500 mg by mouth 3 (three) times daily.    Yes Historical Provider, MD  Multiple Vitamin (MULTIVITAMIN WITH  MINERALS) TABS tablet Take 1 tablet by mouth daily.   Yes Historical Provider, MD  naproxen (NAPROSYN) 500 MG tablet Take 500 mg by mouth 2 (two) times daily with a meal.    Yes Historical Provider, MD  oxcarbazepine (TRILEPTAL) 600 MG tablet Take 600 mg by mouth at bedtime.    Yes Historical Provider, MD  pregabalin (LYRICA) 150 MG capsule Take 150 mg by mouth 2 (two) times daily.   Yes Historical Provider, MD  pregabalin (LYRICA) 75 MG capsule Take 75 mg by mouth 2 (two) times daily.    Yes Historical Provider, MD  tiZANidine (ZANAFLEX) 4 MG tablet Take 4 mg by mouth every 6 (six) hours as needed for muscle spasms.  03/24/14  Yes Historical Provider, MD  topiramate (TOPAMAX) 200 MG tablet Take 200 mg by mouth at bedtime. 03/31/14  Yes Historical Provider, MD  topiramate (TOPAMAX) 50 MG tablet Take 100 mg by mouth at bedtime. 09/15/12  Yes Vernie Shanks, MD  ziprasidone (GEODON) 80 MG capsule Take 160 mg by mouth at bedtime.  03/29/13  Yes Historical Provider, MD  chlorpheniramine-HYDROcodone (TUSSIONEX PENNKINETIC ER) 10-8 MG/5ML LQCR Take 5 mLs by mouth every 12 (twelve) hours as needed for cough. 04/10/14   Hope Bunnie Pion, NP   BP 101/68 mmHg  Pulse 102  Temp(Src) 98 F (36.7 C) (Oral)  Resp 16  Ht 5\' 2"  (1.575 m)  Wt 192 lb (87.091 kg)  BMI 35.11 kg/m2  SpO2 99% Physical Exam  Constitutional: She is oriented to person, place, and time. She appears well-developed and well-nourished.  Eyes: EOM are normal.  Neck: Neck supple.  Cardiovascular: Tachycardia present.   Pulmonary/Chest: Effort normal. She has rhonchi.  Abdominal: Soft. There is no tenderness.  Musculoskeletal: Normal range of motion.  Neurological: She is alert and oriented to person, place, and time. No cranial nerve deficit.  Skin: Skin is warm and dry.  Psychiatric: She has a normal mood and affect. Her behavior is normal.  Nursing note and vitals reviewed.   ED Course  Procedures (including critical care time) Labs  Review Labs Reviewed  URINALYSIS, ROUTINE W REFLEX MICROSCOPIC    Imaging Review Dg Chest 2 View  04/09/2014   CLINICAL DATA:  Sob, left sided chest pain, productive cough- yellow sputum, generalized body aches x 2 weeks. Stated diagnosed on 11-2 with pneumonia. History of asthma, GERD. ICD10: R 7.9  EXAM: CHEST  2 VIEW  COMPARISON:  03/29/2014  FINDINGS: Lower cervical spine fixation. Midline trachea. Normal heart size and mediastinal contours. No pleural effusion or pneumothorax. Superior segment left lower lobe airspace  disease has resolved. Clear lungs.  IMPRESSION: No acute cardiopulmonary disease.   Electronically Signed   By: Abigail Miyamoto M.D.   On: 04/09/2014 18:23   Dg Ribs Unilateral W/chest Left  04/10/2014   CLINICAL DATA:  Left chest and lower rib pain.  EXAM: LEFT RIBS AND CHEST - 3+ VIEW  COMPARISON:  04/09/2014 and prior chest radiographs dating back to 03/10/2009  FINDINGS: The cardiomediastinal silhouette is unremarkable.  Mild peribronchial thickening is unchanged.  There is no evidence of focal airspace disease, pulmonary edema, suspicious pulmonary nodule/mass, pleural effusion, or pneumothorax. No acute bony abnormalities are identified.  Lower cervical fusion changes again noted.  IMPRESSION: Negative.   Electronically Signed   By: Hassan Rowan M.D.   On: 04/10/2014 16:03     MDM  53 y.o. female with left rib pain that started abruptly after coughing. Discussed with the patient clinical and x-ray findings and plan of care. All questioned fully answered. She will return if any problems arise. Stable for discharge without shortness of breath, fever and O2 Sat 99% on R/A.   Final diagnoses:  Pain  Cough        Ashley Murrain, NP 04/10/14 [place], MD 04/10/14 1900

## 2014-04-12 ENCOUNTER — Ambulatory Visit (AMBULATORY_SURGERY_CENTER): Payer: PRIVATE HEALTH INSURANCE | Admitting: Gastroenterology

## 2014-04-12 ENCOUNTER — Encounter: Payer: Self-pay | Admitting: Gastroenterology

## 2014-04-12 VITALS — BP 134/82 | HR 79 | Temp 98.2°F | Resp 21 | Ht 62.0 in | Wt 195.0 lb

## 2014-04-12 DIAGNOSIS — D125 Benign neoplasm of sigmoid colon: Secondary | ICD-10-CM

## 2014-04-12 DIAGNOSIS — K635 Polyp of colon: Secondary | ICD-10-CM

## 2014-04-12 DIAGNOSIS — Z1211 Encounter for screening for malignant neoplasm of colon: Secondary | ICD-10-CM

## 2014-04-12 DIAGNOSIS — R1314 Dysphagia, pharyngoesophageal phase: Secondary | ICD-10-CM

## 2014-04-12 DIAGNOSIS — K319 Disease of stomach and duodenum, unspecified: Secondary | ICD-10-CM

## 2014-04-12 DIAGNOSIS — K219 Gastro-esophageal reflux disease without esophagitis: Secondary | ICD-10-CM | POA: Diagnosis not present

## 2014-04-12 DIAGNOSIS — K2990 Gastroduodenitis, unspecified, without bleeding: Secondary | ICD-10-CM | POA: Diagnosis not present

## 2014-04-12 DIAGNOSIS — K2970 Gastritis, unspecified, without bleeding: Secondary | ICD-10-CM

## 2014-04-12 MED ORDER — SODIUM CHLORIDE 0.9 % IV SOLN: 500.0000 mL | INTRAVENOUS | Status: DC

## 2014-04-12 NOTE — Progress Notes (Signed)
Report to PACU, RN, vss, BBS= Clear.  

## 2014-04-12 NOTE — Op Note (Signed)
[place]  [address]. [place], [address]   COLONOSCOPY PROCEDURE REPORT  PATIENT: Darlene Wilcox, Darlene Wilcox  MR#: [id number] BIRTHDATE: 04/07/1961 , 52  yrs. old GENDER: female ENDOSCOPIST: Ladene Artist, MD, [facility] REFERRED BY: Stevan Born, FNP PROCEDURE DATE:  04/12/2014 PROCEDURE:   Colonoscopy with biopsy First Screening Colonoscopy - Avg.  risk and is 50 yrs.  old or older Yes.  Prior Negative Screening - Now for repeat screening. N/A  History of Adenoma - Now for follow-up colonoscopy & has been > or = to 3 yrs.  N/A  Polyps Removed Today? Yes. ASA CLASS:   Class II INDICATIONS:average risk for colorectal cancer. MEDICATIONS: Monitored anesthesia care, Propofol 250 mg IV, and Lidocaine 100 mg IV DESCRIPTION OF PROCEDURE:   After the risks benefits and alternatives of the procedure were thoroughly explained, informed consent was obtained.  The digital rectal exam revealed no abnormalities of the rectum.   The [id number]  endoscope was introduced through the anus and advanced to the cecum, which was identified by both the appendix and ileocecal valve. No adverse events experienced.   The quality of the prep was adequate, using MoviPrep  The instrument was then slowly withdrawn as the colon was fully examined.  COLON FINDINGS: Two sessile polyps measuring 5 mm in size were found in the sigmoid colon.  A polypectomy was performed with cold forceps.  The resection was complete, the polyp tissue was completely retrieved and sent to histology.   The examined terminal ileum appeared to be normal.   The examination was otherwise normal.  Retroflexed views revealed no abnormalities. The time to cecum=2 minutes 52 seconds.  Withdrawal time=11 minutes 44 seconds. The scope was withdrawn and the procedure completed.  COMPLICATIONS: There were no immediate complications.  ENDOSCOPIC IMPRESSION: 1.   Two sessile polyps in the sigmoid colon; polypectomy  performed with cold forceps 2.   The examination was otherwise normal  RECOMMENDATIONS: 1.  Await pathology results 2.  Repeat colonoscopy in 5 years if polyp(s) adenomatous; otherwise 10 years  eSigned:  Ladene Artist, MD, [facility] 04/12/2014 8:40 AM

## 2014-04-12 NOTE — Patient Instructions (Signed)
Discharge instructions given with verbal understanding. Handouts on polyps and gastritis. Resume previous medications. YOU HAD AN ENDOSCOPIC PROCEDURE TODAY AT [facility]: Refer to the procedure report that was given to you for any specific questions about what was found during the examination.  If the procedure report does not answer your questions, please call your gastroenterologist to clarify.  If you requested that your care partner not be given the details of your procedure findings, then the procedure report has been included in a sealed envelope for you to review at your convenience later.  YOU SHOULD EXPECT: Some feelings of bloating in the abdomen. Passage of more gas than usual.  Walking can help get rid of the air that was put into your GI tract during the procedure and reduce the bloating. If you had a lower endoscopy (such as a colonoscopy or flexible sigmoidoscopy) you may notice spotting of blood in your stool or on the toilet paper. If you underwent a bowel prep for your procedure, then you may not have a normal bowel movement for a few days.  DIET: Your first meal following the procedure should be a light meal and then it is ok to progress to your normal diet.  A half-sandwich or bowl of soup is an example of a good first meal.  Heavy or fried foods are harder to digest and may make you feel nauseous or bloated.  Likewise meals heavy in dairy and vegetables can cause extra gas to form and this can also increase the bloating.  Drink plenty of fluids but you should avoid alcoholic beverages for 24 hours.  ACTIVITY: Your care partner should take you home directly after the procedure.  You should plan to take it easy, moving slowly for the rest of the day.  You can resume normal activity the day after the procedure however you should NOT DRIVE or use heavy machinery for 24 hours (because of the sedation medicines used during the test).    SYMPTOMS TO REPORT IMMEDIATELY: A  gastroenterologist can be reached at any hour.  During normal business hours, 8:30 AM to 5:00 PM Monday through Friday, call [phone].  After hours and on weekends, please call the GI answering service at [phone] who will take a message and have the physician on call contact you.   Following lower endoscopy (colonoscopy or flexible sigmoidoscopy):  Excessive amounts of blood in the stool  Significant tenderness or worsening of abdominal pains  Swelling of the abdomen that is new, acute  Fever of 100F or higher  Following upper endoscopy (EGD)  Vomiting of blood or coffee ground material  New chest pain or pain under the shoulder blades  Painful or persistently difficult swallowing  New shortness of breath  Fever of 100F or higher  Black, tarry-looking stools  FOLLOW UP: If any biopsies were taken you will be contacted by phone or by letter within the next 1-3 weeks.  Call your gastroenterologist if you have not heard about the biopsies in 3 weeks.  Our staff will call the home number listed on your records the next business day following your procedure to check on you and address any questions or concerns that you may have at that time regarding the information given to you following your procedure. This is a courtesy call and so if there is no answer at the home number and we have not heard from you through the emergency physician on call, we will assume that you have  returned to your regular daily activities without incident.  SIGNATURES/CONFIDENTIALITY: You and/or your care partner have signed paperwork which will be entered into your electronic medical record.  These signatures attest to the fact that that the information above on your After Visit Summary has been reviewed and is understood.  Full responsibility of the confidentiality of this discharge information lies with you and/or your care-partner.

## 2014-04-12 NOTE — Progress Notes (Signed)
Called to room to assist during endoscopic procedure.  Patient ID and intended procedure confirmed with present staff. Received instructions for my participation in the procedure from the performing physician.  

## 2014-04-12 NOTE — Op Note (Signed)
[place]  [address] [place], [address]   ENDOSCOPY PROCEDURE REPORT  PATIENT: Darlene, Wilcox  MR#: [id number] BIRTHDATE: [birth year] , 52  yrs. old GENDER: female ENDOSCOPIST: Ladene Artist, MD, [facility] REFERRED BY:  Stevan Born, FNP PROCEDURE DATE:  04/12/2014 PROCEDURE:  EGD w/ biopsy ASA CLASS:     Class II INDICATIONS:  dysphagia and history of esophageal reflux. MEDICATIONS: Residual sedation present, Monitored anesthesia care, Propofol 110 mg IV, and Lidocaine 60 mg IV TOPICAL ANESTHETIC: none DESCRIPTION OF PROCEDURE: After the risks benefits and alternatives of the procedure were thoroughly explained, informed consent was obtained.  The LB [id number] endoscope was introduced through the mouth and advanced to the second portion of the duodenum , Without limitations.  The instrument was slowly withdrawn as the mucosa was fully examined.    STOMACH: Mild gastritis  was found in the gastric antrum.  Multiple biopsies were performed.   The stomach otherwise appeared normal.  ESOPHAGUS: The mucosa of the esophagus appeared normal. DUODENUM: The duodenal mucosa showed no abnormalities in the bulb and 2nd part of the duodenum.  Retroflexed views revealed no abnormalities.   The scope was then withdrawn from the patient and the procedure completed.  COMPLICATIONS: There were no immediate complications.  ENDOSCOPIC IMPRESSION: 1.    Mild gastritis in the gastric antrum; multiple biopsies performed 2.   The EGD otherwise appeared normal  RECOMMENDATIONS: 1.  Anti-reflux regimen 2.  Await pathology results 3.  Continue PPI 4.  Follow-up appointment with primary MD 1 for ongoing care  eSigned:  Ladene Artist, MD, [facility] 04/12/2014 8:45 AM

## 2014-04-13 ENCOUNTER — Telehealth: Payer: Self-pay

## 2014-04-13 NOTE — Telephone Encounter (Signed)
Left a message at [phone] for the pt to call if any questions or concerns. maw

## 2014-04-15 ENCOUNTER — Telehealth: Payer: Self-pay | Admitting: Family Medicine

## 2014-04-15 NOTE — Telephone Encounter (Signed)
Appointment given for Dec 3 with Dietrich Pates

## 2014-04-16 ENCOUNTER — Encounter: Payer: Self-pay | Admitting: Gastroenterology

## 2014-04-20 ENCOUNTER — Encounter: Payer: Self-pay | Admitting: Neurology

## 2014-04-20 ENCOUNTER — Ambulatory Visit (INDEPENDENT_AMBULATORY_CARE_PROVIDER_SITE_OTHER): Payer: PRIVATE HEALTH INSURANCE | Admitting: Neurology

## 2014-04-20 VITALS — BP 128/82 | HR 94 | Resp 18 | Ht 62.0 in | Wt 193.0 lb

## 2014-04-20 DIAGNOSIS — G40009 Localization-related (focal) (partial) idiopathic epilepsy and epileptic syndromes with seizures of localized onset, not intractable, without status epilepticus: Secondary | ICD-10-CM

## 2014-04-20 DIAGNOSIS — F319 Bipolar disorder, unspecified: Secondary | ICD-10-CM
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
## 2014-04-24 ENCOUNTER — Other Ambulatory Visit: Payer: Self-pay | Admitting: Family Medicine

## 2014-04-28 ENCOUNTER — Encounter: Payer: Self-pay | Admitting: Family Medicine

## 2014-04-28 ENCOUNTER — Ambulatory Visit (INDEPENDENT_AMBULATORY_CARE_PROVIDER_SITE_OTHER): Payer: Medicare Other | Admitting: Family Medicine

## 2014-04-28 ENCOUNTER — Ambulatory Visit (INDEPENDENT_AMBULATORY_CARE_PROVIDER_SITE_OTHER): Payer: Medicare Other

## 2014-04-28 VITALS — BP 100/64 | HR 102 | Temp 97.8°F | Ht 62.0 in | Wt 190.6 lb

## 2014-04-28 DIAGNOSIS — R0789 Other chest pain: Secondary | ICD-10-CM

## 2014-04-28 DIAGNOSIS — R05 Cough: Secondary | ICD-10-CM

## 2014-04-28 DIAGNOSIS — R053 Chronic cough: Secondary | ICD-10-CM

## 2014-04-28 LAB — POCT URINALYSIS DIPSTICK
Bilirubin, UA: NEGATIVE
Blood, UA: NEGATIVE
Glucose, UA: NEGATIVE
Ketones, UA: NEGATIVE
Nitrite, UA: NEGATIVE
Protein, UA: NEGATIVE
Spec Grav, UA: >=1.03
Urobilinogen, UA: NEGATIVE
pH, UA: 5

## 2014-04-28 LAB — POCT CBC
Granulocyte percent: 66.6 %G (ref 37–80)
HCT, POC: 41.3 % (ref 37.7–47.9)
Hemoglobin: 13.8 g/dL (ref 12.2–16.2)
Lymph, poc: 2.9 (ref 0.6–3.4)
MCH, POC: 28.8 pg (ref 27–31.2)
MCHC: 33.3 g/dL (ref 31.8–35.4)
MCV: 86.5 fL (ref 80–97)
MPV: 7.3 fL (ref 0–99.8)
POC Granulocyte: 6.7 (ref 2–6.9)
POC LYMPH PERCENT: 29 %L (ref 10–50)
Platelet Count, POC: 267 10*3/uL (ref 142–424)
RBC: 4.8 M/uL (ref 4.04–5.48)
RDW, POC: 14.7 %
WBC: 10.1 10*3/uL (ref 4.6–10.2)

## 2014-04-28 LAB — POCT UA - MICROSCOPIC ONLY
Bacteria, U Microscopic: NEGATIVE
Casts, Ur, LPF, POC: NEGATIVE
RBC, urine, microscopic: NEGATIVE
Yeast, UA: NEGATIVE

## 2014-04-28 MED ORDER — CLONAZEPAM 1 MG PO TABS: 1.0000 mg | ORAL_TABLET | Freq: Three times a day (TID) | ORAL | Status: DC

## 2014-04-28 NOTE — Progress Notes (Signed)
   Subjective:    Patient ID: Darlene Wilcox, female    DOB: 1960-06-02, 53 y.o.   MRN: [id number]  HPI Patient is here for follow up.  She c/o sweating on the right side of her face and not the left.  She c/o persistent cough with left lower costal pain.  She has hxof GERD, psychatric illness, copd, and OA.  Review of Systems  Constitutional: Negative for fever.  HENT: Negative for ear pain.   Eyes: Negative for discharge.  Respiratory: Negative for cough.   Cardiovascular: Negative for chest pain.  Gastrointestinal: Negative for abdominal distention.  Endocrine: Negative for polyuria.  Genitourinary: Negative for difficulty urinating.  Musculoskeletal: Negative for gait problem and neck pain.  Skin: Negative for color change and rash.  Neurological: Negative for speech difficulty and headaches.  Psychiatric/Behavioral: Negative for agitation.       Objective:    BP 100/64 mmHg  Pulse 102  Temp(Src) 97.8 F (36.6 C) (Oral)  Ht $R'5\' 2"'vo$  (1.575 m)  Wt 190 lb 9.6 oz (86.456 kg)  BMI 34.85 kg/m2 Physical Exam  Constitutional: She is oriented to person, place, and time. She appears well-developed and well-nourished.  HENT:  Head: Normocephalic and atraumatic.  Mouth/Throat: Oropharynx is clear and moist.  Eyes: Pupils are equal, round, and reactive to light.  Neck: Normal range of motion. Neck supple.  Cardiovascular: Normal rate and regular rhythm.   No murmur heard. Pulmonary/Chest: Effort normal and breath sounds normal.  Abdominal: Soft. Bowel sounds are normal. There is no tenderness.  Neurological: She is alert and oriented to person, place, and time.  Skin: Skin is warm and dry.  Psychiatric: She has a normal mood and affect.          Assessment & Plan:     ICD-9-CM ICD-10-CM   1. Chest wall pain 786.52 R07.89 POCT UA - Microscopic Only     POCT urinalysis dipstick     DG Chest 2 View     CANCELED: DG Ribs Unilateral W/Chest Left  2. Persistent cough 786.2  R05 POCT CBC     CMP14+EGFR     POCT UA - Microscopic Only     POCT urinalysis dipstick     Thyroid Panel With TSH     DG Chest 2 View     CANCELED: DG Ribs Unilateral W/Chest Left     No Follow-up on file.  Lysbeth Penner FNP

## 2014-04-29 ENCOUNTER — Other Ambulatory Visit: Payer: Self-pay | Admitting: Family Medicine

## 2014-04-29 LAB — CMP14+EGFR
ALT: 13 IU/L (ref 0–32)
AST: 17 IU/L (ref 0–40)
Albumin/Globulin Ratio: 1.7 (ref 1.1–2.5)
Albumin: 4.3 g/dL (ref 3.5–5.5)
Alkaline Phosphatase: 145 IU/L — ABNORMAL HIGH (ref 39–117)
BUN/Creatinine Ratio: 24 — ABNORMAL HIGH (ref 9–23)
BUN: 20 mg/dL (ref 6–24)
CO2: 23 mmol/L (ref 18–29)
Calcium: 9.8 mg/dL (ref 8.7–10.2)
Chloride: 102 mmol/L (ref 97–108)
Creatinine, Ser: 0.84 mg/dL (ref 0.57–1.00)
GFR calc Af Amer: 93 mL/min/{1.73_m2} (ref >59)
GFR calc non Af Amer: 81 mL/min/{1.73_m2} (ref >59)
Globulin, Total: 2.6 g/dL (ref 1.5–4.5)
Glucose: 82 mg/dL (ref 65–99)
Potassium: 5.1 mmol/L (ref 3.5–5.2)
Sodium: 140 mmol/L (ref 134–144)
Total Bilirubin: <0.2 mg/dL (ref 0.0–1.2)
Total Protein: 6.9 g/dL (ref 6.0–8.5)

## 2014-04-29 LAB — THYROID PANEL WITH TSH
Free Thyroxine Index: 1.4 (ref 1.2–4.9)
T3 Uptake Ratio: 27 % (ref 24–39)
T4, Total: 5.2 ug/dL (ref 4.5–12.0)
TSH: 5.64 u[IU]/mL — ABNORMAL HIGH (ref 0.450–4.500)

## 2014-05-05 ENCOUNTER — Other Ambulatory Visit (INDEPENDENT_AMBULATORY_CARE_PROVIDER_SITE_OTHER): Payer: Medicare Other

## 2014-05-05 DIAGNOSIS — R946 Abnormal results of thyroid function studies: Secondary | ICD-10-CM

## 2014-05-05 DIAGNOSIS — R7989 Other specified abnormal findings of blood chemistry: Secondary | ICD-10-CM

## 2014-05-05 NOTE — Progress Notes (Signed)
Lab only 

## 2014-05-06 ENCOUNTER — Ambulatory Visit: Payer: Medicare Other

## 2014-05-06 LAB — TSH: TSH: 0.961 u[IU]/mL (ref 0.450–4.500)

## 2014-05-10 ENCOUNTER — Telehealth: Payer: Self-pay | Admitting: *Deleted

## 2014-05-10 NOTE — Telephone Encounter (Signed)
Normal thyroid result.

## 2014-05-24 ENCOUNTER — Other Ambulatory Visit: Payer: Self-pay | Admitting: Family Medicine

## 2014-06-15 DIAGNOSIS — M5106 Intervertebral disc disorders with myelopathy, lumbar region: Secondary | ICD-10-CM | POA: Diagnosis not present

## 2014-06-15 DIAGNOSIS — G894 Chronic pain syndrome: Secondary | ICD-10-CM | POA: Diagnosis not present

## 2014-06-17 DIAGNOSIS — M4716 Other spondylosis with myelopathy, lumbar region: Secondary | ICD-10-CM | POA: Diagnosis not present

## 2014-06-17 DIAGNOSIS — M545 Low back pain: Secondary | ICD-10-CM | POA: Diagnosis not present

## 2014-06-17 DIAGNOSIS — G894 Chronic pain syndrome: Secondary | ICD-10-CM | POA: Diagnosis not present

## 2014-06-25 ENCOUNTER — Other Ambulatory Visit (INDEPENDENT_AMBULATORY_CARE_PROVIDER_SITE_OTHER): Payer: Medicaid Other

## 2014-06-25 ENCOUNTER — Other Ambulatory Visit: Payer: Medicaid Other

## 2014-06-25 DIAGNOSIS — F331 Major depressive disorder, recurrent, moderate: Secondary | ICD-10-CM

## 2014-06-25 DIAGNOSIS — F411 Generalized anxiety disorder: Secondary | ICD-10-CM | POA: Diagnosis not present

## 2014-06-25 NOTE — Progress Notes (Signed)
Lab work for Dr. Arlyn Leak K. [place]

## 2014-06-26 LAB — RENAL FUNCTION PANEL
Albumin: 4.5 g/dL (ref 3.5–5.5)
BUN/Creatinine Ratio: 29 — ABNORMAL HIGH (ref 9–23)
BUN: 20 mg/dL (ref 6–24)
CO2: 25 mmol/L (ref 18–29)
Calcium: 9.9 mg/dL (ref 8.7–10.2)
Chloride: 99 mmol/L (ref 97–108)
Creatinine, Ser: 0.68 mg/dL (ref 0.57–1.00)
GFR calc Af Amer: 116 mL/min/{1.73_m2} (ref >59)
GFR calc non Af Amer: 101 mL/min/{1.73_m2} (ref >59)
Glucose: 95 mg/dL (ref 65–99)
Phosphorus: 3.9 mg/dL (ref 2.5–4.5)
Potassium: 4 mmol/L (ref 3.5–5.2)
Sodium: 141 mmol/L (ref 134–144)

## 2014-06-26 LAB — HEPATIC FUNCTION PANEL
ALT: 20 IU/L (ref 0–32)
AST: 25 IU/L (ref 0–40)
Alkaline Phosphatase: 161 IU/L — ABNORMAL HIGH (ref 39–117)
Bilirubin, Direct: 0.04 mg/dL (ref 0.00–0.40)
Total Bilirubin: <0.2 mg/dL (ref 0.0–1.2)
Total Protein: 7.4 g/dL (ref 6.0–8.5)

## 2014-06-26 LAB — URINALYSIS, COMPLETE
Bilirubin, UA: NEGATIVE
Glucose, UA: NEGATIVE
Ketones, UA: NEGATIVE
Leukocytes, UA: NEGATIVE
Nitrite, UA: NEGATIVE
Protein, UA: NEGATIVE
RBC, UA: NEGATIVE
Specific Gravity, UA: 1.017 (ref 1.005–1.030)
Urobilinogen, Ur: 0.2 mg/dL (ref 0.2–1.0)
pH, UA: 6.5 (ref 5.0–7.5)

## 2014-06-26 LAB — CBC WITH DIFFERENTIAL/PLATELET
Basophils Absolute: 0.1 10*3/uL (ref 0.0–0.2)
Basos: 1 %
Eos: 2 %
Eosinophils Absolute: 0.2 10*3/uL (ref 0.0–0.4)
HCT: 43.3 % (ref 34.0–46.6)
Hemoglobin: 14.4 g/dL (ref 11.1–15.9)
Immature Grans (Abs): 0 10*3/uL (ref 0.0–0.1)
Immature Granulocytes: 0 %
Lymphocytes Absolute: 2.9 10*3/uL (ref 0.7–3.1)
Lymphs: 29 %
MCH: 29.8 pg (ref 26.6–33.0)
MCHC: 33.3 g/dL (ref 31.5–35.7)
MCV: 90 fL (ref 79–97)
Monocytes Absolute: 0.7 10*3/uL (ref 0.1–0.9)
Monocytes: 7 %
Neutrophils Absolute: 5.8 10*3/uL (ref 1.4–7.0)
Neutrophils Relative %: 61 %
Platelets: 273 10*3/uL (ref 150–379)
RBC: 4.84 x10E6/uL (ref 3.77–5.28)
RDW: 15.4 % (ref 12.3–15.4)
WBC: 9.7 10*3/uL (ref 3.4–10.8)

## 2014-06-26 LAB — MICROSCOPIC EXAMINATION: Casts: NONE SEEN /lpf

## 2014-06-26 LAB — VALPROIC ACID LEVEL: Valproic Acid Lvl: 17 ug/mL — ABNORMAL LOW (ref 50–100)

## 2014-06-26 LAB — TSH: TSH: 1.68 u[IU]/mL (ref 0.450–4.500)

## 2014-06-28 DIAGNOSIS — Z23 Encounter for immunization: Secondary | ICD-10-CM | POA: Diagnosis not present

## 2014-06-28 DIAGNOSIS — Z6836 Body mass index (BMI) 36.0-36.9, adult: Secondary | ICD-10-CM | POA: Diagnosis not present

## 2014-06-28 DIAGNOSIS — G894 Chronic pain syndrome: Secondary | ICD-10-CM | POA: Diagnosis not present

## 2014-06-28 DIAGNOSIS — Z981 Arthrodesis status: Secondary | ICD-10-CM | POA: Diagnosis not present

## 2014-06-28 DIAGNOSIS — Z79899 Other long term (current) drug therapy: Secondary | ICD-10-CM | POA: Diagnosis not present

## 2014-06-28 DIAGNOSIS — F419 Anxiety disorder, unspecified: Secondary | ICD-10-CM | POA: Diagnosis not present

## 2014-06-28 DIAGNOSIS — M5416 Radiculopathy, lumbar region: Secondary | ICD-10-CM | POA: Diagnosis not present

## 2014-06-28 DIAGNOSIS — M4716 Other spondylosis with myelopathy, lumbar region: Secondary | ICD-10-CM | POA: Diagnosis not present

## 2014-06-28 DIAGNOSIS — E669 Obesity, unspecified: Secondary | ICD-10-CM | POA: Diagnosis not present

## 2014-06-28 DIAGNOSIS — M961 Postlaminectomy syndrome, not elsewhere classified: Secondary | ICD-10-CM | POA: Diagnosis not present

## 2014-06-28 DIAGNOSIS — T84296A Other mechanical complication of internal fixation device of vertebrae, initial encounter: Secondary | ICD-10-CM | POA: Diagnosis not present

## 2014-06-28 DIAGNOSIS — M4806 Spinal stenosis, lumbar region: Secondary | ICD-10-CM | POA: Diagnosis not present

## 2014-06-28 DIAGNOSIS — M47816 Spondylosis without myelopathy or radiculopathy, lumbar region: Secondary | ICD-10-CM | POA: Diagnosis not present

## 2014-06-28 HISTORY — PX: LUMBAR FUSION: SHX111

## 2014-07-01 ENCOUNTER — Other Ambulatory Visit: Payer: Self-pay | Admitting: Family Medicine

## 2014-07-14 ENCOUNTER — Telehealth: Payer: Self-pay | Admitting: Family Medicine

## 2014-07-20 ENCOUNTER — Ambulatory Visit (INDEPENDENT_AMBULATORY_CARE_PROVIDER_SITE_OTHER): Payer: 59 | Admitting: Neurology

## 2014-07-20 ENCOUNTER — Encounter: Payer: Self-pay | Admitting: Neurology

## 2014-07-20 VITALS — BP 112/72 | HR 102 | Resp 18 | Ht 62.0 in | Wt 205.0 lb

## 2014-07-20 DIAGNOSIS — F319 Bipolar disorder, unspecified: Secondary | ICD-10-CM | POA: Diagnosis not present

## 2014-07-20 DIAGNOSIS — G40009 Localization-related (focal) (partial) idiopathic epilepsy and epileptic syndromes with seizures of localized onset, not intractable, without status epilepticus: Secondary | ICD-10-CM | POA: Diagnosis not present
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
## 2014-07-21 ENCOUNTER — Ambulatory Visit (INDEPENDENT_AMBULATORY_CARE_PROVIDER_SITE_OTHER): Payer: Medicare Other | Admitting: Family Medicine

## 2014-07-21 ENCOUNTER — Encounter: Payer: Self-pay | Admitting: Family Medicine

## 2014-07-21 VITALS — BP 128/72 | HR 104 | Temp 98.9°F | Ht 62.0 in | Wt 205.4 lb

## 2014-07-21 DIAGNOSIS — B370 Candidal stomatitis: Secondary | ICD-10-CM

## 2014-07-21 MED ORDER — CLOTRIMAZOLE 10 MG MT TROC: 10.0000 mg | Freq: Every day | OROMUCOSAL | Status: DC

## 2014-07-21 NOTE — Progress Notes (Signed)
Subjective:  Patient ID: Darlene Wilcox, female    DOB: [birth year]  Age: 54 y.o. MRN: [id number]  CC: Thrush   HPI Darlene Wilcox presents for Darlene Wilcox presents for recent increase in burning in her tongue. This is been going on for about 5 months altogether. She is concerned that it could be thrush. She did take some antibiotics at the beginning of the time of the infection. Today she is in for treatment for that. Made worse by spicy foods and hot liquids. General sensation of irritation of the mouth and tongue is also present  History Darlene Wilcox has a past medical history of Anxiety; Seizures; Headache(784.0); Asthma; GERD (gastroesophageal reflux disease); Full dentures; Bipolar disorder; Depression; Chronic back pain greater than 3 months duration; Carpal tunnel syndrome of right wrist (12/2012); and History of ankle sprain.   She has past surgical history that includes Cervical fusion; Tubal ligation; Abdominal hysterectomy; Inguinal hernia repair (Right); Bunionectomy (Left); Diagnostic laparoscopy (11/08/2006); Laparoscopic appendectomy (11/08/2006); Lumbar laminectomy (02/05/2007); Carpal tunnel release (Right, 01/13/2013); Appendectomy; and Lumbar fusion (06/28/2014).   Her family history includes Diabetes in her maternal grandmother; Lung cancer in her father.She reports that she has been smoking Cigarettes.  She has a 3.75 pack-year smoking history. She has never used smokeless tobacco. She reports that she does not drink alcohol or use illicit drugs.  Current Outpatient Prescriptions on File Prior to Visit  Medication Sig Dispense Refill  . albuterol (PROAIR HFA) 108 (90 BASE) MCG/ACT inhaler Inhale 2 puffs into the lungs every 4 (four) hours as needed for wheezing or shortness of breath. 8.5 each 5  . baclofen (LIORESAL) 10 MG tablet Take 10 mg by mouth 3 (three) times daily.   2  . beclomethasone (QVAR) 80 MCG/ACT inhaler Inhale 2 puffs into the lungs 2 (two) times daily. 1 Inhaler  12  . clonazePAM (KLONOPIN) 1 MG tablet Take 1 tablet (1 mg total) by mouth 3 (three) times daily. 30 tablet 3  . divalproex (DEPAKOTE ER) 500 MG 24 hr tablet Take 500 mg by mouth at bedtime.    Marland Kitchen esomeprazole (NEXIUM) 40 MG capsule Take 1 capsule (40 mg total) by mouth 2 (two) times daily before a meal. 60 capsule 5  . FLUoxetine (PROZAC) 20 MG capsule Take 60 mg by mouth daily.     . furosemide (LASIX) 40 MG tablet Take 40 mg by mouth daily.    Marland Kitchen gemfibrozil (LOPID) 600 MG tablet Take 600 mg by mouth 2 (two) times daily before a meal.   11  . HYDROcodone-acetaminophen (NORCO) 10-325 MG per tablet Take 1 tablet by mouth every 6 (six) hours as needed.   0  . imipramine (TOFRANIL) 50 MG tablet TAKE 2 TABLETS BY MOUTH AT BEDTIME 60 tablet 1  . methocarbamol (ROBAXIN) 500 MG tablet Take 500 mg by mouth 3 (three) times daily.     . Multiple Vitamin (MULTIVITAMIN WITH MINERALS) TABS tablet Take 1 tablet by mouth daily.    . naproxen (NAPROSYN) 500 MG tablet TAKE 1 TABLET (500 MG TOTAL) BY MOUTH 2 (TWO) TIMES DAILY WITH A MEAL. 60 tablet 1  . oxcarbazepine (TRILEPTAL) 600 MG tablet Take 600 mg by mouth at bedtime.     Marland Kitchen oxyCODONE-acetaminophen (PERCOCET) 10-325 MG per tablet Every 6 hours prn pain  0  . pregabalin (LYRICA) 150 MG capsule Take 150 mg by mouth 2 (two) times daily.    Marland Kitchen tiZANidine (ZANAFLEX) 4 MG tablet Take 4 mg by mouth every  6 (six) hours as needed for muscle spasms.   0  . topiramate (TOPAMAX) 200 MG tablet Take 200 mg by mouth at bedtime.  2  . ziprasidone (GEODON) 80 MG capsule Take 160 mg by mouth at bedtime.     . [DISCONTINUED] metolazone (ZAROXOLYN) 2.5 MG tablet Take 1 tablet (2.5 mg total) by mouth daily. (Patient not taking: Reported on 04/09/2014) 3 tablet 1   No current facility-administered medications on file prior to visit.    ROS Review of Systems  Constitutional: Negative for fever, activity change and appetite change.  HENT: Positive for mouth sores and sore  throat. Negative for congestion, drooling, facial swelling and trouble swallowing.   Respiratory: Negative for cough and shortness of breath.   Gastrointestinal: Negative for abdominal pain.    Objective:  BP 128/72 mmHg  Pulse 104  Temp(Src) 98.9 F (37.2 C) (Oral)  Ht 5\' 2"  (1.575 m)  Wt 205 lb 6.4 oz (93.169 kg)  BMI 37.56 kg/m2  BP Readings from Last 3 Encounters:  07/21/14 128/72  07/20/14 112/72  04/28/14 100/64    Wt Readings from Last 3 Encounters:  07/21/14 205 lb 6.4 oz (93.169 kg)  07/20/14 205 lb (92.987 kg)  04/28/14 190 lb 9.6 oz (86.456 kg)     Physical Exam  Constitutional: She is oriented to person, place, and time. She appears well-developed and well-nourished. No distress.  HENT:  Head: Normocephalic and atraumatic.  Right Ear: External ear normal.  Left Ear: External ear normal.  Nose: Nose normal.  Mouth/Throat: Oropharyngeal exudate (grey plaque covers much of dorsal tongue) present.  Eyes: Conjunctivae and EOM are normal. Pupils are equal, round, and reactive to light.  Neck: Normal range of motion. Neck supple. No thyromegaly present.  Cardiovascular: Normal rate, regular rhythm and normal heart sounds.   No murmur heard. Pulmonary/Chest: Effort normal and breath sounds normal. No respiratory distress. She has no wheezes. She has no rales.  Lymphadenopathy:    She has no cervical adenopathy.  Neurological: She is alert and oriented to person, place, and time.  Skin: Skin is warm and dry.    No results found for: HGBA1C  Lab Results  Component Value Date   WBC 9.7 06/25/2014   HGB 14.4 06/25/2014   HCT 43.3 06/25/2014   PLT 273 06/25/2014   GLUCOSE 95 06/25/2014   CHOL 220* 11/03/2013   TRIG 583* 11/03/2013   HDL 26* 11/03/2013   LDLCALC Comment 11/03/2013   ALT 20 06/25/2014   AST 25 06/25/2014   NA 141 06/25/2014   K 4.0 06/25/2014   CL 99 06/25/2014   CREATININE 0.68 06/25/2014   BUN 20 06/25/2014   CO2 25 06/25/2014   TSH  1.680 06/25/2014   INR 1.0 02/03/2007    Dg Ribs Unilateral W/chest Left  04/10/2014   CLINICAL DATA:  Left chest and lower rib pain.  EXAM: LEFT RIBS AND CHEST - 3+ VIEW  COMPARISON:  04/09/2014 and prior chest radiographs dating back to 03/10/2009  FINDINGS: The cardiomediastinal silhouette is unremarkable.  Mild peribronchial thickening is unchanged.  There is no evidence of focal airspace disease, pulmonary edema, suspicious pulmonary nodule/mass, pleural effusion, or pneumothorax. No acute bony abnormalities are identified.  Lower cervical fusion changes again noted.  IMPRESSION: Negative.   Electronically Signed   By: Hassan Rowan M.D.   On: 04/10/2014 16:03    Assessment & Plan:   Fatumata was seen today for thrush.  Diagnoses and all orders for this  visit:  Thrush  Other orders -     clotrimazole (MYCELEX) 10 MG troche; Take 1 tablet (10 mg total) by mouth 5 (five) times daily.   I am having Ms. Langi start on clotrimazole. I am also having her maintain her oxcarbazepine, FLUoxetine, ziprasidone, multivitamin with minerals, methocarbamol, beclomethasone, albuterol, esomeprazole, gemfibrozil, baclofen, tiZANidine, topiramate, pregabalin, furosemide, divalproex, HYDROcodone-acetaminophen, clonazePAM, imipramine, naproxen, and oxyCODONE-acetaminophen.  Meds ordered this encounter  Medications  . clotrimazole (MYCELEX) 10 MG troche    Sig: Take 1 tablet (10 mg total) by mouth 5 (five) times daily.    Dispense:  35 tablet    Refill:  1    Follow-up: Return in about 1 month (around 08/19/2014), or if symptoms worsen or fail to improve.  Claretta Fraise, M.D.

## 2014-07-27 ENCOUNTER — Encounter: Payer: Self-pay | Admitting: Neurology

## 2014-07-29 ENCOUNTER — Telehealth: Payer: Medicare Other | Admitting: Family Medicine

## 2014-07-29 ENCOUNTER — Ambulatory Visit (INDEPENDENT_AMBULATORY_CARE_PROVIDER_SITE_OTHER): Payer: Medicare Other | Admitting: Family Medicine

## 2014-07-29 ENCOUNTER — Encounter: Payer: Self-pay | Admitting: Family Medicine

## 2014-07-29 VITALS — BP 106/67 | HR 102 | Temp 97.6°F | Ht 62.0 in | Wt 198.6 lb

## 2014-07-29 DIAGNOSIS — Z833 Family history of diabetes mellitus: Secondary | ICD-10-CM | POA: Diagnosis not present

## 2014-07-29 DIAGNOSIS — J45909 Unspecified asthma, uncomplicated: Secondary | ICD-10-CM

## 2014-07-29 DIAGNOSIS — Z0000 Encounter for general adult medical examination without abnormal findings: Secondary | ICD-10-CM

## 2014-07-29 DIAGNOSIS — R0600 Dyspnea, unspecified: Secondary | ICD-10-CM

## 2014-07-29 NOTE — Telephone Encounter (Signed)
Patient is to return tomorrow for labs and a spirometry. Placed on lab schedule at 9:15 tomorrow morning. Patient aware.

## 2014-07-29 NOTE — Telephone Encounter (Signed)
Darlene Wilcox knew what she was talking about and told me to tell her that 9 was fine.

## 2014-07-29 NOTE — Progress Notes (Signed)
Subjective:  Patient ID: Darlene Wilcox, female    DOB: [birth year]  Age: 54 y.o. MRN: [id number]  CC: Annual Exam   HPI [name] presents for annual examination  History Darlene Wilcox has a past medical history of Anxiety; Seizures; Headache(784.0); Asthma; GERD (gastroesophageal reflux disease); Full dentures; Bipolar disorder; Depression; Chronic back pain greater than 3 months duration; Carpal tunnel syndrome of right wrist (12/2012); and History of ankle sprain.   She has past surgical history that includes Cervical fusion; Tubal ligation; Abdominal hysterectomy; Inguinal hernia repair (Right); Bunionectomy (Left); Diagnostic laparoscopy (11/08/2006); Laparoscopic appendectomy (11/08/2006); Lumbar laminectomy (02/05/2007); Carpal tunnel release (Right, 01/13/2013); Appendectomy; and Lumbar fusion (06/28/2014).   Her family history includes Diabetes in her maternal grandmother; Lung cancer in her father.She reports that she has been smoking Cigarettes.  She has a 3.75 pack-year smoking history. She has never used smokeless tobacco. She reports that she does not drink alcohol or use illicit drugs.  Current Outpatient Prescriptions on File Prior to Visit  Medication Sig Dispense Refill  . albuterol (PROAIR HFA) 108 (90 BASE) MCG/ACT inhaler Inhale 2 puffs into the lungs every 4 (four) hours as needed for wheezing or shortness of breath. 8.5 each 5  . baclofen (LIORESAL) 10 MG tablet Take 10 mg by mouth 3 (three) times daily.   2  . beclomethasone (QVAR) 80 MCG/ACT inhaler Inhale 2 puffs into the lungs 2 (two) times daily. 1 Inhaler 12  . clonazePAM (KLONOPIN) 1 MG tablet Take 1 tablet (1 mg total) by mouth 3 (three) times daily. 30 tablet 3  . clotrimazole (MYCELEX) 10 MG troche Take 1 tablet (10 mg total) by mouth 5 (five) times daily. 35 tablet 1  . divalproex (DEPAKOTE ER) 500 MG 24 hr tablet Take 500 mg by mouth at bedtime.    Marland Kitchen esomeprazole (NEXIUM) 40 MG capsule Take 1 capsule (40 mg  total) by mouth 2 (two) times daily before a meal. 60 capsule 5  . FLUoxetine (PROZAC) 20 MG capsule Take 60 mg by mouth daily.     . furosemide (LASIX) 40 MG tablet Take 40 mg by mouth daily.    Marland Kitchen gemfibrozil (LOPID) 600 MG tablet Take 600 mg by mouth 2 (two) times daily before a meal.   11  . HYDROcodone-acetaminophen (NORCO) 10-325 MG per tablet Take 1 tablet by mouth every 6 (six) hours as needed.   0  . imipramine (TOFRANIL) 50 MG tablet TAKE 2 TABLETS BY MOUTH AT BEDTIME 60 tablet 1  . methocarbamol (ROBAXIN) 500 MG tablet Take 500 mg by mouth 3 (three) times daily.     . Multiple Vitamin (MULTIVITAMIN WITH MINERALS) TABS tablet Take 1 tablet by mouth daily.    . naproxen (NAPROSYN) 500 MG tablet TAKE 1 TABLET (500 MG TOTAL) BY MOUTH 2 (TWO) TIMES DAILY WITH A MEAL. 60 tablet 1  . oxcarbazepine (TRILEPTAL) 600 MG tablet Take 600 mg by mouth at bedtime.     Marland Kitchen oxyCODONE-acetaminophen (PERCOCET) 10-325 MG per tablet Every 6 hours prn pain  0  . pregabalin (LYRICA) 150 MG capsule Take 150 mg by mouth 2 (two) times daily.    Marland Kitchen tiZANidine (ZANAFLEX) 4 MG tablet Take 4 mg by mouth every 6 (six) hours as needed for muscle spasms.   0  . topiramate (TOPAMAX) 200 MG tablet Take 200 mg by mouth at bedtime.  2  . ziprasidone (GEODON) 80 MG capsule Take 160 mg by mouth at bedtime.     . [  DISCONTINUED] metolazone (ZAROXOLYN) 2.5 MG tablet Take 1 tablet (2.5 mg total) by mouth daily. (Patient not taking: Reported on 04/09/2014) 3 tablet 1   No current facility-administered medications on file prior to visit.    ROS Review of Systems  Constitutional: Negative for fever, chills, diaphoresis, appetite change, fatigue and unexpected weight change.  HENT: Negative for congestion, ear pain, hearing loss, postnasal drip, rhinorrhea, sneezing, sore throat and trouble swallowing.   Eyes: Negative for pain.  Respiratory: Negative for cough, chest tightness and shortness of breath.   Cardiovascular: Negative  for chest pain and palpitations.  Gastrointestinal: Negative for nausea, vomiting, abdominal pain, diarrhea and constipation.  Genitourinary: Negative for dysuria, frequency and menstrual problem.  Musculoskeletal: Negative for joint swelling and arthralgias.  Skin: Negative for rash.  Neurological: Negative for dizziness, weakness, numbness and headaches.  Psychiatric/Behavioral: Negative for dysphoric mood and agitation.    Objective:  BP 106/67 mmHg  Pulse 102  Temp(Src) 97.6 F (36.4 C) (Oral)  Ht 5' 2" (1.575 m)  Wt 198 lb 9.6 oz (90.084 kg)  BMI 36.32 kg/m2  Physical Exam  Constitutional: She is oriented to person, place, and time. She appears well-developed and well-nourished. No distress.  HENT:  Head: Normocephalic and atraumatic.  Right Ear: External ear normal.  Left Ear: External ear normal.  Nose: Nose normal.  Mouth/Throat: Oropharynx is clear and moist.  Eyes: Conjunctivae and EOM are normal. Pupils are equal, round, and reactive to light.  Neck: Normal range of motion. Neck supple. No thyromegaly present.  Cardiovascular: Normal rate, regular rhythm and normal heart sounds.   No murmur heard. Pulmonary/Chest: Effort normal and breath sounds normal. No respiratory distress. She has no wheezes. She has no rales.  Abdominal: Soft. Bowel sounds are normal. She exhibits no distension. There is no tenderness.  Genitourinary: No breast tenderness (breasts free of masses).  Musculoskeletal: Normal range of motion. She exhibits no edema or tenderness.  Lymphadenopathy:    She has no cervical adenopathy.  Neurological: She is alert and oriented to person, place, and time. She has normal reflexes.  Skin: Skin is warm and dry.  Psychiatric: She has a normal mood and affect. Her behavior is normal. Judgment and thought content normal.    Assessment & Plan:   Darlene Wilcox was seen today for annual exam.  Diagnoses and all orders for this visit:  Wellness  examination Orders: -     POCT glycosylated hemoglobin (Hb A1C) -     CMP14+EGFR -     NMR, lipoprofile -     Vit D  25 hydroxy (rtn osteoporosis monitoring)   I am having Ms. Lore maintain her oxcarbazepine, FLUoxetine, ziprasidone, multivitamin with minerals, methocarbamol, beclomethasone, albuterol, esomeprazole, gemfibrozil, baclofen, tiZANidine, topiramate, pregabalin, furosemide, divalproex, HYDROcodone-acetaminophen, clonazePAM, imipramine, naproxen, oxyCODONE-acetaminophen, and clotrimazole.  No orders of the defined types were placed in this encounter.     Follow-up: No Follow-up on file.  Claretta Fraise, M.D.

## 2014-07-30 ENCOUNTER — Other Ambulatory Visit: Payer: Self-pay | Admitting: Family Medicine

## 2014-07-30 ENCOUNTER — Other Ambulatory Visit: Payer: Medicare Other

## 2014-07-30 DIAGNOSIS — Z0000 Encounter for general adult medical examination without abnormal findings: Secondary | ICD-10-CM | POA: Diagnosis not present

## 2014-07-30 DIAGNOSIS — M4716 Other spondylosis with myelopathy, lumbar region: Secondary | ICD-10-CM | POA: Diagnosis not present

## 2014-07-30 DIAGNOSIS — Z0189 Encounter for other specified special examinations: Secondary | ICD-10-CM | POA: Diagnosis not present

## 2014-07-30 LAB — POCT GLYCOSYLATED HEMOGLOBIN (HGB A1C): Hemoglobin A1C: 5.2

## 2014-07-30 MED ORDER — SULFAMETHOXAZOLE-TRIMETHOPRIM 800-160 MG PO TABS: 1.0000 | ORAL_TABLET | Freq: Two times a day (BID) | ORAL | Status: DC

## 2014-07-30 NOTE — Progress Notes (Signed)
Lab work for Dr [name]

## 2014-07-30 NOTE — Addendum Note (Signed)
Addended by: Ilean China on: 07/30/2014 04:14 PM   Modules accepted: Orders

## 2014-07-30 NOTE — Addendum Note (Signed)
Addended by: Earlene Plater on: 07/30/2014 09:10 AM   Modules accepted: Miquel Dunn

## 2014-07-30 NOTE — Addendum Note (Signed)
Addended by: Ilean China on: 07/30/2014 01:25 PM   Modules accepted: Orders

## 2014-07-30 NOTE — Addendum Note (Signed)
Addended by: Marin Olp on: 07/30/2014 09:35 AM   Modules accepted: Orders, SmartSet

## 2014-07-31 LAB — CMP14+EGFR
ALBUMIN: 3.9 g/dL (ref 3.5–5.5)
ALK PHOS: 161 IU/L — AB (ref 39–117)
ALT: 19 IU/L (ref 0–32)
AST: 11 IU/L (ref 0–40)
Albumin/Globulin Ratio: 1.4 (ref 1.1–2.5)
BUN / CREAT RATIO: 37 — AB (ref 9–23)
BUN: 25 mg/dL — ABNORMAL HIGH (ref 6–24)
CALCIUM: 8.6 mg/dL — AB (ref 8.7–10.2)
CO2: 23 mmol/L (ref 18–29)
Chloride: 102 mmol/L (ref 97–108)
Creatinine, Ser: 0.67 mg/dL (ref 0.57–1.00)
GFR calc Af Amer: 117 mL/min/{1.73_m2} (ref >59)
GFR calc non Af Amer: 101 mL/min/{1.73_m2} (ref >59)
Globulin, Total: 2.7 g/dL (ref 1.5–4.5)
Glucose: 95 mg/dL (ref 65–99)
Potassium: 4.2 mmol/L (ref 3.5–5.2)
Sodium: 139 mmol/L (ref 134–144)
Total Protein: 6.6 g/dL (ref 6.0–8.5)

## 2014-07-31 LAB — NMR, LIPOPROFILE
Cholesterol: 219 mg/dL — ABNORMAL HIGH (ref 100–199)
HDL Cholesterol by NMR: 33 mg/dL — ABNORMAL LOW (ref >39)
HDL PARTICLE NUMBER: 30.6 umol/L (ref >=30.5)
LDL PARTICLE NUMBER: 1931 nmol/L — AB (ref <1000)
LDL Size: 19.7 nm (ref >20.5)
LDL-C: 114 mg/dL — ABNORMAL HIGH (ref 0–99)
LP-IR Score: 94 — ABNORMAL HIGH (ref <=45)
Small LDL Particle Number: 1489 nmol/L — ABNORMAL HIGH (ref <=527)
Triglycerides by NMR: 359 mg/dL — ABNORMAL HIGH (ref 0–149)

## 2014-07-31 LAB — VITAMIN D 25 HYDROXY (VIT D DEFICIENCY, FRACTURES): Vit D, 25-Hydroxy: 20.9 ng/mL — ABNORMAL LOW (ref 30.0–100.0)

## 2014-08-06 ENCOUNTER — Ambulatory Visit: Payer: Medicare Other | Admitting: Nurse Practitioner

## 2014-08-07 ENCOUNTER — Ambulatory Visit (INDEPENDENT_AMBULATORY_CARE_PROVIDER_SITE_OTHER): Payer: Medicare Other | Admitting: Nurse Practitioner

## 2014-08-07 VITALS — BP 108/72 | HR 104 | Temp 98.8°F | Ht 62.0 in | Wt 200.8 lb

## 2014-08-07 DIAGNOSIS — R0789 Other chest pain: Secondary | ICD-10-CM | POA: Diagnosis not present

## 2014-08-07 DIAGNOSIS — J45909 Unspecified asthma, uncomplicated: Secondary | ICD-10-CM | POA: Insufficient documentation

## 2014-08-07 DIAGNOSIS — J4521 Mild intermittent asthma with (acute) exacerbation: Secondary | ICD-10-CM | POA: Diagnosis not present

## 2014-08-07 DIAGNOSIS — J45901 Unspecified asthma with (acute) exacerbation: Secondary | ICD-10-CM

## 2014-08-07 MED ORDER — PREDNISONE 20 MG PO TABS: ORAL_TABLET | ORAL | Status: DC

## 2014-08-07 MED ORDER — LEVALBUTEROL HCL 1.25 MG/0.5ML IN NEBU: 1.2500 mg | INHALATION_SOLUTION | RESPIRATORY_TRACT | Status: AC

## 2014-08-07 MED ORDER — BENZONATATE 100 MG PO CAPS: 200.0000 mg | ORAL_CAPSULE | Freq: Three times a day (TID) | ORAL | Status: DC | PRN

## 2014-08-07 MED ORDER — CEFDINIR 300 MG PO CAPS: 300.0000 mg | ORAL_CAPSULE | Freq: Two times a day (BID) | ORAL | Status: DC

## 2014-08-07 NOTE — Patient Instructions (Signed)

## 2014-08-07 NOTE — Progress Notes (Signed)
Subjective:    Patient ID: Darlene Wilcox, female    DOB: 1961-02-22, 54 y.o.   MRN: [id number]  HPI Patient in c/o SOB and cough- has history of asthma- started coughing 3 days ago with SOB and wheezing. Denies any fever or congestion. Is on qvar daily and has needed to use albuterol more often. Chest tightness. Lots of coughing.    Review of Systems  Constitutional: Negative.   HENT: Positive for congestion and rhinorrhea.   Respiratory: Positive for cough, shortness of breath and wheezing.   Cardiovascular: Negative.   Gastrointestinal: Negative.   Genitourinary: Negative.   Neurological: Negative.   Psychiatric/Behavioral: Negative.   All other systems reviewed and are negative.      Objective:   Physical Exam  Constitutional: She is oriented to person, place, and time. She appears well-developed and well-nourished. She appears distressed.  HENT:  Right Ear: External ear normal.  Left Ear: External ear normal.  Nose: Nose normal.  Mouth/Throat: Oropharynx is clear and moist.  Eyes: Pupils are equal, round, and reactive to light.  Neck: Normal range of motion. Neck supple. No thyromegaly present.  Cardiovascular: Normal rate, regular rhythm and normal heart sounds.   Pulmonary/Chest: She has wheezes.  Diminished breath sounds bil lower lobes tachypnea  Abdominal: Soft. Bowel sounds are normal.  Lymphadenopathy:    She has no cervical adenopathy.  Neurological: She is alert and oriented to person, place, and time.  Skin: Skin is warm.  Psychiatric: She has a normal mood and affect. Her behavior is normal. Judgment and thought content normal.   BP 108/72 mmHg  Pulse 104  Temp(Src) 98.8 F (37.1 C) (Oral)  Ht 5\' 2"  (1.575 m)  Wt 200 lb 12.8 oz (91.082 kg)  BMI 36.72 kg/m2  SpO2 90%  S/p xopenex neb- increased breath sounds  EKG sinus tachycardia     Assessment & Plan:   1. Asthma attack   2. Asthma, chronic, mild intermittent, with acute exacerbation     3. Chest tightness   4. Asthmatic bronchitis, mild intermittent, with acute exacerbation    Meds ordered this encounter  Medications  . levalbuterol (XOPENEX) nebulizer solution 1.25 mg    Sig:   . cefdinir (OMNICEF) 300 MG capsule    Sig: Take 1 capsule (300 mg total) by mouth 2 (two) times daily. 1 po BID    Dispense:  20 capsule    Refill:  0    Order Specific Question:  Supervising Provider    Answer:  Chipper Herb [[id number]]  . benzonatate (TESSALON PERLES) 100 MG capsule    Sig: Take 2 capsules (200 mg total) by mouth 3 (three) times daily as needed for cough.    Dispense:  20 capsule    Refill:  0    Order Specific Question:  Supervising Provider    Answer:  Chipper Herb [[id number]]  . predniSONE (DELTASONE) 20 MG tablet    Sig: 2 po at sametime daily for 5 days    Dispense:  10 tablet    Refill:  0    Order Specific Question:  Supervising Provider    Answer:  Chipper Herb [[id number]]   Rest Albuterol as needed Follow up with dr. [name] if not improving 1. Take meds as prescribed 2. Use a cool mist humidifier especially during the winter months and when heat has been humid. 3. Use saline nose sprays frequently 4. Saline irrigations of the nose can be very helpful if done  frequently.  * 4X daily for 1 week*  * Use of a nettie pot can be helpful with this. Follow directions with this* 5. Drink plenty of fluids 6. Keep thermostat turn down low 7.For any cough or congestion  Use plain Mucinex- regular strength or max strength is fine   * Children- consult with Pharmacist for dosing 8. For fever or aces or pains- take tylenol or ibuprofen appropriate for age and weight.  * for fevers greater than 101 orally you may alternate ibuprofen and tylenol every  3 hours.     Mary-Margaret Hassell Done, FNP

## 2014-08-10 MED ORDER — LEVALBUTEROL HCL 1.25 MG/0.5ML IN NEBU: 1.2500 mg | INHALATION_SOLUTION | RESPIRATORY_TRACT | Status: AC

## 2014-08-10 NOTE — Addendum Note (Signed)
Addended by: Jamelle Haring on: 08/10/2014 12:33 PM   Modules accepted: Orders, SmartSet

## 2014-08-19 ENCOUNTER — Other Ambulatory Visit: Payer: Self-pay | Admitting: Nurse Practitioner

## 2014-08-24 ENCOUNTER — Other Ambulatory Visit: Payer: Self-pay | Admitting: Family Medicine

## 2014-09-01 DIAGNOSIS — M4326 Fusion of spine, lumbar region: Secondary | ICD-10-CM | POA: Diagnosis not present

## 2014-09-08 ENCOUNTER — Telehealth: Payer: Self-pay | Admitting: Nurse Practitioner

## 2014-09-08 NOTE — Telephone Encounter (Signed)
Patient aware we have not called her.

## 2014-09-30 DIAGNOSIS — G894 Chronic pain syndrome: Secondary | ICD-10-CM | POA: Diagnosis not present

## 2014-09-30 DIAGNOSIS — M4326 Fusion of spine, lumbar region: Secondary | ICD-10-CM | POA: Diagnosis not present

## 2014-09-30 DIAGNOSIS — M5106 Intervertebral disc disorders with myelopathy, lumbar region: Secondary | ICD-10-CM | POA: Diagnosis not present

## 2014-09-30 DIAGNOSIS — M961 Postlaminectomy syndrome, not elsewhere classified: Secondary | ICD-10-CM | POA: Diagnosis not present

## 2014-10-28 DIAGNOSIS — I10 Essential (primary) hypertension: Secondary | ICD-10-CM | POA: Diagnosis not present

## 2014-10-28 DIAGNOSIS — Z79899 Other long term (current) drug therapy: Secondary | ICD-10-CM | POA: Diagnosis not present

## 2014-10-28 DIAGNOSIS — G894 Chronic pain syndrome: Secondary | ICD-10-CM | POA: Diagnosis not present

## 2014-10-28 DIAGNOSIS — M4326 Fusion of spine, lumbar region: Secondary | ICD-10-CM | POA: Diagnosis not present

## 2014-10-28 DIAGNOSIS — Z79891 Long term (current) use of opiate analgesic: Secondary | ICD-10-CM | POA: Diagnosis not present

## 2014-10-28 DIAGNOSIS — Z6838 Body mass index (BMI) 38.0-38.9, adult: Secondary | ICD-10-CM | POA: Diagnosis not present

## 2014-11-02 ENCOUNTER — Encounter: Payer: Self-pay | Admitting: Family

## 2014-11-02 ENCOUNTER — Ambulatory Visit (INDEPENDENT_AMBULATORY_CARE_PROVIDER_SITE_OTHER): Payer: Medicare Other | Admitting: Family

## 2014-11-02 VITALS — BP 135/84 | HR 106 | Temp 98.8°F | Ht 62.0 in | Wt 204.8 lb

## 2014-11-02 DIAGNOSIS — J209 Acute bronchitis, unspecified: Secondary | ICD-10-CM | POA: Diagnosis not present

## 2014-11-02 MED ORDER — BENZONATATE 100 MG PO CAPS: 200.0000 mg | ORAL_CAPSULE | Freq: Three times a day (TID) | ORAL | Status: DC | PRN

## 2014-11-02 MED ORDER — METHYLPREDNISOLONE 4 MG PO TBPK: ORAL_TABLET | ORAL | Status: DC

## 2014-11-02 MED ORDER — HYDROCODONE-HOMATROPINE 5-1.5 MG/5ML PO SYRP: 5.0000 mL | ORAL_SOLUTION | Freq: Three times a day (TID) | ORAL | Status: DC | PRN

## 2014-11-02 MED ORDER — LEVOFLOXACIN 500 MG PO TABS: 500.0000 mg | ORAL_TABLET | Freq: Every day | ORAL | Status: DC

## 2014-11-02 MED ORDER — METHYLPREDNISOLONE ACETATE 80 MG/ML IJ SUSP
80.0000 mg | Freq: Once | INTRAMUSCULAR | Status: AC
  Administered 2014-11-02: 80 mg via INTRAMUSCULAR

## 2014-11-02 NOTE — Patient Instructions (Signed)
Acute Bronchitis Bronchitis is inflammation of the airways that extend from the windpipe into the lungs (bronchi). The inflammation often causes mucus to develop. This leads to a cough, which is the most common symptom of bronchitis.  In acute bronchitis, the condition usually develops suddenly and goes away over time, usually in a couple weeks. Smoking, allergies, and asthma can make bronchitis worse. Repeated episodes of bronchitis may cause further lung problems.  CAUSES Acute bronchitis is most often caused by the same virus that causes a cold. The virus can spread from person to person (contagious) through coughing, sneezing, and touching contaminated objects. SIGNS AND SYMPTOMS   Cough.   Fever.   Coughing up mucus.   Body aches.   Chest congestion.   Chills.   Shortness of breath.   Sore throat.  DIAGNOSIS  Acute bronchitis is usually diagnosed through a physical exam. Your health care provider will also ask you questions about your medical history. Tests, such as chest X-rays, are sometimes done to rule out other conditions.  TREATMENT  Acute bronchitis usually goes away in a couple weeks. Oftentimes, no medical treatment is necessary. Medicines are sometimes given for relief of fever or cough. Antibiotic medicines are usually not needed but may be prescribed in certain situations. In some cases, an inhaler may be recommended to help reduce shortness of breath and control the cough. A cool mist vaporizer may also be used to help thin bronchial secretions and make it easier to clear the chest.  HOME CARE INSTRUCTIONS  Get plenty of rest.   Drink enough fluids to keep your urine clear or pale yellow (unless you have a medical condition that requires fluid restriction). Increasing fluids may help thin your respiratory secretions (sputum) and reduce chest congestion, and it will prevent dehydration.   Take medicines only as directed by your health care provider.  If  you were prescribed an antibiotic medicine, finish it all even if you start to feel better.  Avoid smoking and secondhand smoke. Exposure to cigarette smoke or irritating chemicals will make bronchitis worse. If you are a smoker, consider using nicotine gum or skin patches to help control withdrawal symptoms. Quitting smoking will help your lungs heal faster.   Reduce the chances of another bout of acute bronchitis by washing your hands frequently, avoiding people with cold symptoms, and trying not to touch your hands to your mouth, nose, or eyes.   Keep all follow-up visits as directed by your health care provider.  SEEK MEDICAL CARE IF: Your symptoms do not improve after 1 week of treatment.  SEEK IMMEDIATE MEDICAL CARE IF:  You develop an increased fever or chills.   You have chest pain.   You have severe shortness of breath.  You have bloody sputum.   You develop dehydration.  You faint or repeatedly feel like you are going to pass out.  You develop repeated vomiting.  You develop a severe headache. MAKE SURE YOU:   Understand these instructions.  Will watch your condition.  Will get help right away if you are not doing well or get worse. Document Released: 06/21/2004 Document Revised: 09/28/2013 Document Reviewed: 11/04/2012 [employer] Patient Information 2015 [employer]. This information is not intended to replace advice given to you by your health care provider. Make sure you discuss any questions you have with your health care provider.  - Take meds as prescribed - Use a cool mist humidifier  -Use saline nose sprays frequently -Saline irrigations of the nose can   be very helpful if done frequently.  * 4X daily for 1 week*  * Use of a nettie pot can be helpful with this. Follow directions with this* -Force fluids -For any cough or congestion  Use plain Mucinex- regular strength or max strength is fine   * Children- consult with Pharmacist for dosing -For  fever or aces or pains- take tylenol or ibuprofen appropriate for age and weight.  * for fevers greater than 101 orally you may alternate ibuprofen and tylenol every  3 hours. -Throat lozenges if help    Christy Hawks, FNP  

## 2014-11-02 NOTE — Progress Notes (Signed)
Subjective:    Patient ID: Darlene Wilcox, female    DOB: 12-04-1960, 54 y.o.   MRN: [id number]  Cough This is a new problem. The current episode started in the past 7 days. The problem has been unchanged. The problem occurs every few minutes. The cough is productive of sputum. Associated symptoms include chills, headaches, myalgias, postnasal drip, rhinorrhea, shortness of breath and wheezing. Pertinent negatives include no ear congestion, ear pain, fever, hemoptysis, nasal congestion or sore throat. The symptoms are aggravated by lying down. Risk factors for lung disease include smoking/tobacco exposure. She has tried rest and ipratropium inhaler for the symptoms. The treatment provided mild relief. Her past medical history is significant for asthma and COPD.      Review of Systems  Constitutional: Positive for chills. Negative for fever.  HENT: Positive for postnasal drip and rhinorrhea. Negative for ear pain and sore throat.   Eyes: Negative.   Respiratory: Positive for cough, shortness of breath and wheezing. Negative for hemoptysis.   Cardiovascular: Negative.  Negative for palpitations.  Gastrointestinal: Negative.   Endocrine: Negative.   Genitourinary: Negative.   Musculoskeletal: Positive for myalgias.  Neurological: Positive for headaches.  Hematological: Negative.   Psychiatric/Behavioral: Negative.   All other systems reviewed and are negative.      Objective:   Physical Exam  Constitutional: She is oriented to person, place, and time. She appears well-developed and well-nourished. No distress.  HENT:  Head: Normocephalic and atraumatic.  Right Ear: External ear normal.  Left Ear: External ear normal.  Nasal passage with mild swelling  Oropharynx erythemas   Eyes: Pupils are equal, round, and reactive to light.  Neck: Normal range of motion. Neck supple. No thyromegaly present.  Cardiovascular: Normal rate, regular rhythm, normal heart sounds and intact  distal pulses.   No murmur heard. Pulmonary/Chest: Effort normal. No respiratory distress. She has no wheezes.  Constant coarse cough, diminished breath sounds    Abdominal: Soft. Bowel sounds are normal. She exhibits no distension. There is no tenderness.  Musculoskeletal: Normal range of motion. She exhibits no edema or tenderness.  Neurological: She is alert and oriented to person, place, and time. She has normal reflexes. No cranial nerve deficit.  Skin: Skin is warm and dry.  Psychiatric: She has a normal mood and affect. Her behavior is normal. Judgment and thought content normal.  Vitals reviewed.     BP 135/84 mmHg  Pulse 106  Temp(Src) 98.8 F (37.1 C) (Oral)  Ht 5\' 2"  (1.575 m)  Wt 204 lb 12.8 oz (92.897 kg)  BMI 37.45 kg/m2     Assessment & Plan:  1. Acute bronchitis, unspecified organism -- Take meds as prescribed - Use a cool mist humidifier  -Use saline nose sprays frequently -Saline irrigations of the nose can be very helpful if done frequently.  * 4X daily for 1 week*  * Use of a nettie pot can be helpful with this. Follow directions with this* -Force fluids -For any cough or congestion  Use plain Mucinex- regular strength or max strength is fine   * Children- consult with Pharmacist for dosing -For fever or aces or pains- take tylenol or ibuprofen appropriate for age and weight.  * for fevers greater than 101 orally you may alternate ibuprofen and tylenol every  3 hours. -Throat lozenges if help - methylPREDNISolone acetate (DEPO-MEDROL) injection 80 mg; Inject 1 mL (80 mg total) into the muscle once. - methylPREDNISolone (MEDROL DOSEPAK) 4 MG TBPK tablet; Use as  directed  Dispense: 21 tablet; Refill: 0 - benzonatate (TESSALON PERLES) 100 MG capsule; Take 2 capsules (200 mg total) by mouth 3 (three) times daily as needed for cough.  Dispense: 20 capsule; Refill: 0 - HYDROcodone-homatropine (HYCODAN) 5-1.5 MG/5ML syrup; Take 5 mLs by mouth every 8 (eight)  hours as needed for cough.  Dispense: 120 mL; Refill: 0 - levofloxacin (LEVAQUIN) 500 MG tablet; Take 1 tablet (500 mg total) by mouth daily.  Dispense: 7 tablet; Refill: 0  Evelina Dun, FNP

## 2014-11-22 ENCOUNTER — Other Ambulatory Visit: Payer: Self-pay | Admitting: Family Medicine

## 2014-11-22 ENCOUNTER — Other Ambulatory Visit: Payer: Self-pay | Admitting: Family

## 2014-11-22 NOTE — Telephone Encounter (Signed)
Last seen 11/02/14 Darlene Wilcox

## 2014-11-30 DIAGNOSIS — M542 Cervicalgia: Secondary | ICD-10-CM | POA: Diagnosis not present

## 2014-11-30 DIAGNOSIS — G894 Chronic pain syndrome: Secondary | ICD-10-CM | POA: Diagnosis not present

## 2014-11-30 DIAGNOSIS — M4326 Fusion of spine, lumbar region: Secondary | ICD-10-CM | POA: Diagnosis not present

## 2014-12-01 ENCOUNTER — Encounter: Payer: Self-pay | Admitting: Family

## 2014-12-01 ENCOUNTER — Ambulatory Visit (INDEPENDENT_AMBULATORY_CARE_PROVIDER_SITE_OTHER): Payer: Medicare Other | Admitting: Family

## 2014-12-01 VITALS — BP 104/74 | HR 96 | Temp 97.1°F | Ht 62.0 in | Wt 196.0 lb

## 2014-12-01 DIAGNOSIS — N390 Urinary tract infection, site not specified: Secondary | ICD-10-CM | POA: Diagnosis not present

## 2014-12-01 DIAGNOSIS — L089 Local infection of the skin and subcutaneous tissue, unspecified: Secondary | ICD-10-CM | POA: Diagnosis not present

## 2014-12-01 DIAGNOSIS — N898 Other specified noninflammatory disorders of vagina: Secondary | ICD-10-CM

## 2014-12-01 DIAGNOSIS — R300 Dysuria: Secondary | ICD-10-CM | POA: Diagnosis not present

## 2014-12-01 DIAGNOSIS — L298 Other pruritus: Secondary | ICD-10-CM

## 2014-12-01 LAB — POCT URINALYSIS DIPSTICK
Bilirubin, UA: NEGATIVE
Glucose, UA: NEGATIVE
Ketones, UA: NEGATIVE
Leukocytes, UA: NEGATIVE
Nitrite, UA: NEGATIVE
RBC UA: NEGATIVE
SPEC GRAV UA: 1.01
UROBILINOGEN UA: NEGATIVE
pH, UA: 7.5

## 2014-12-01 LAB — POCT WET PREP (WET MOUNT)

## 2014-12-01 LAB — POCT UA - MICROSCOPIC ONLY
CASTS, UR, LPF, POC: NEGATIVE
CRYSTALS, UR, HPF, POC: NEGATIVE
Mucus, UA: NEGATIVE
RBC, urine, microscopic: NEGATIVE
YEAST UA: NEGATIVE

## 2014-12-01 MED ORDER — SULFAMETHOXAZOLE-TRIMETHOPRIM 800-160 MG PO TABS
1.0000 | ORAL_TABLET | Freq: Two times a day (BID) | ORAL | Status: DC
Start: 2014-12-01 — End: 2015-08-29

## 2014-12-01 MED ORDER — MUPIROCIN 2 % EX OINT: 1.0000 "application " | TOPICAL_OINTMENT | Freq: Two times a day (BID) | CUTANEOUS | Status: DC

## 2014-12-01 NOTE — Patient Instructions (Signed)
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
## 2014-12-07 DIAGNOSIS — M545 Low back pain: Secondary | ICD-10-CM | POA: Diagnosis not present

## 2014-12-07 DIAGNOSIS — G890 Central pain syndrome: Secondary | ICD-10-CM | POA: Diagnosis not present

## 2014-12-07 DIAGNOSIS — G541 Lumbosacral plexus disorders: Secondary | ICD-10-CM | POA: Diagnosis not present

## 2014-12-07 DIAGNOSIS — M25561 Pain in right knee: Secondary | ICD-10-CM | POA: Diagnosis not present

## 2014-12-07 DIAGNOSIS — R202 Paresthesia of skin: Secondary | ICD-10-CM | POA: Diagnosis not present

## 2014-12-08 ENCOUNTER — Ambulatory Visit: Payer: Medicare Other | Admitting: Family

## 2014-12-09 ENCOUNTER — Encounter: Payer: Self-pay | Admitting: Physician Assistant

## 2014-12-09 ENCOUNTER — Ambulatory Visit (INDEPENDENT_AMBULATORY_CARE_PROVIDER_SITE_OTHER): Payer: Medicare Other | Admitting: Physician Assistant

## 2014-12-09 VITALS — BP 137/83 | HR 94 | Temp 97.1°F | Ht 62.0 in | Wt 197.2 lb

## 2014-12-09 DIAGNOSIS — L03115 Cellulitis of right lower limb: Secondary | ICD-10-CM

## 2014-12-09 MED ORDER — DOXYCYCLINE HYCLATE 100 MG PO TABS: 100.0000 mg | ORAL_TABLET | Freq: Two times a day (BID) | ORAL | Status: DC

## 2014-12-09 NOTE — Patient Instructions (Signed)

## 2014-12-09 NOTE — Progress Notes (Signed)
Subjective:     Patient ID: Darlene Wilcox, female   DOB: September 15, 1960, 54 y.o.   MRN: [id number]  HPI Pt here for f/u of skin infection to the R hip Seen prev for same She was placed on Bactroban and Septra DS Sx improved Area is still healing She is concerned due to some increase in sx to the area  Review of Systems     Objective:   Physical Exam Healing lesion tot he R lateral hip Still with eschar No drainage noted Sl TTP but minimal induration No area of fluct    Assessment:     Cellulitis    Plan:     Recently of Septra DS so will change to Doxycycline Sun precautions given Continue with Bactroban F/U prn

## 2014-12-15 ENCOUNTER — Ambulatory Visit (INDEPENDENT_AMBULATORY_CARE_PROVIDER_SITE_OTHER): Payer: Medicare Other | Admitting: Physician Assistant

## 2014-12-15 VITALS — BP 113/74 | HR 91 | Temp 97.3°F | Ht 62.0 in | Wt 197.2 lb

## 2014-12-15 DIAGNOSIS — N399 Disorder of urinary system, unspecified: Secondary | ICD-10-CM | POA: Diagnosis not present

## 2014-12-15 DIAGNOSIS — N760 Acute vaginitis: Secondary | ICD-10-CM | POA: Diagnosis not present

## 2014-12-15 DIAGNOSIS — N393 Stress incontinence (female) (male): Secondary | ICD-10-CM | POA: Diagnosis not present

## 2014-12-15 DIAGNOSIS — A499 Bacterial infection, unspecified: Secondary | ICD-10-CM | POA: Diagnosis not present

## 2014-12-15 DIAGNOSIS — B9689 Other specified bacterial agents as the cause of diseases classified elsewhere: Secondary | ICD-10-CM

## 2014-12-15 LAB — POCT UA - MICROSCOPIC ONLY
CASTS, UR, LPF, POC: NEGATIVE
Crystals, Ur, HPF, POC: NEGATIVE
MUCUS UA: NEGATIVE
RBC, URINE, MICROSCOPIC: NEGATIVE
Yeast, UA: NEGATIVE

## 2014-12-15 LAB — POCT URINALYSIS DIPSTICK
BILIRUBIN UA: NEGATIVE
Glucose, UA: NEGATIVE
Ketones, UA: NEGATIVE
Leukocytes, UA: NEGATIVE
NITRITE UA: NEGATIVE
PH UA: 5
PROTEIN UA: NEGATIVE
RBC UA: NEGATIVE
Spec Grav, UA: 1.01
UROBILINOGEN UA: NEGATIVE

## 2014-12-15 MED ORDER — METRONIDAZOLE 500 MG PO TABS: 500.0000 mg | ORAL_TABLET | Freq: Two times a day (BID) | ORAL | Status: DC

## 2014-12-15 NOTE — Progress Notes (Signed)
Subjective:     Patient ID: Darlene Wilcox, female   DOB: 1961-01-21, 54 y.o.   MRN: [id number]  HPI Pt with a 1 yr hx of urinary stress incont States sx have gotten worse recently She has had some vague lower abd pain No OTC meds for sx She  has started wearing depend type garments due to sx   Review of Systems  Gastrointestinal: Positive for abdominal pain. Negative for abdominal distention.  Genitourinary: Positive for urgency, frequency and vaginal discharge. Negative for vaginal bleeding, difficulty urinating, vaginal pain, pelvic pain and dyspareunia.       Objective:   Physical Exam  Constitutional: She appears well-developed and well-nourished.  Cardiovascular: Normal rate, regular rhythm and normal heart sounds.   Pulmonary/Chest: Effort normal and breath sounds normal.  Abdominal: Soft. Bowel sounds are normal. She exhibits no distension and no mass. There is tenderness. There is no rebound and no guarding.  Sl TTP in the suprapubic area  Nursing note and vitals reviewed. Urine-sl WBC and clue cells     Assessment:     1. Stress incontinence in female   2. Urinary disorder   3. BV (bacterial vaginosis)        Plan:     Will go ahead with urine cult since pt recently on Bactrim DS Flagyl due to the clue cells F/U in 2 weeks  If no change may start meds for sx

## 2014-12-15 NOTE — Patient Instructions (Signed)
Urinary Incontinence Urinary incontinence is the involuntary loss of urine from your bladder. CAUSES  There are many causes of urinary incontinence. They include:  Medicines.  Infections.  Prostatic enlargement, leading to overflow of urine from your bladder.  Surgery.  Neurological diseases.  Emotional factors. SIGNS AND SYMPTOMS Urinary Incontinence can be divided into four types: 1. Urge incontinence. Urge incontinence is the involuntary loss of urine before you have the opportunity to go to the bathroom. There is a sudden urge to void but not enough time to reach a bathroom. 2. Stress incontinence. Stress incontinence is the sudden loss of urine with any activity that forces urine to pass. It is commonly caused by anatomical changes to the pelvis and sphincter areas of your body. 3. Overflow incontinence. Overflow incontinence is the loss of urine from an obstructed opening to your bladder. This results in a backup of urine and a resultant buildup of pressure within the bladder. When the pressure within the bladder exceeds the closing pressure of the sphincter, the urine overflows, which causes incontinence, similar to water overflowing a dam. 4. Total incontinence. Total incontinence is the loss of urine as a result of the inability to store urine within your bladder. DIAGNOSIS  Evaluating the cause of incontinence may require:  A thorough and complete medical and obstetric history.  A complete physical exam.  Laboratory tests such as a urine culture and sensitivities. When additional tests are indicated, they can include:  An ultrasound exam.  Kidney and bladder X-rays.  Cystoscopy. This is an exam of the bladder using a narrow scope.  Urodynamic testing to test the nerve function to the bladder and sphincter areas. TREATMENT  Treatment for urinary incontinence depends on the cause:  For urge incontinence caused by a bacterial infection, antibiotics will be prescribed.  If the urge incontinence is related to medicines you take, your health care provider may have you change the medicine.  For stress incontinence, surgery to re-establish anatomical support to the bladder or sphincter, or both, will often correct the condition.  For overflow incontinence caused by an enlarged prostate, an operation to open the channel through the enlarged prostate will allow the flow of urine out of the bladder. In women with fibroids, a hysterectomy may be recommended.  For total incontinence, surgery on your urinary sphincter may help. An artificial urinary sphincter (an inflatable cuff placed around the urethra) may be required. In women who have developed a hole-like passage between their bladder and vagina (vesicovaginal fistula), surgery to close the fistula often is required. HOME CARE INSTRUCTIONS  Normal daily hygiene and the use of pads or adult diapers that are changed regularly will help prevent odors and skin damage.  Avoid caffeine. It can overstimulate your bladder.  Use the bathroom regularly. Try about every 2-3 hours to go to the bathroom, even if you do not feel the need to do so. Take time to empty your bladder completely. After urinating, wait a minute. Then try to urinate again.  For causes involving nerve dysfunction, keep a log of the medicines you take and a journal of the times you go to the bathroom. SEEK MEDICAL CARE IF:  You experience worsening of pain instead of improvement in pain after your procedure.  Your incontinence becomes worse instead of better. SEE IMMEDIATE MEDICAL CARE IF:  You experience fever or shaking chills.  You are unable to pass your urine.  You have redness spreading into your groin or down into your thighs. MAKE SURE   YOU:   Understand these instructions.   Will watch your condition.  Will get help right away if you are not doing well or get worse. Document Released: 06/21/2004 Document Revised: 03/04/2013 Document  Reviewed: 10/21/2012 [employer] Patient Information 2015 [employer]. This information is not intended to replace advice given to you by your health care provider. Make sure you discuss any questions you have with your health care provider.  

## 2014-12-17 LAB — URINE CULTURE: ORGANISM ID, BACTERIA: NO GROWTH

## 2014-12-21 DIAGNOSIS — M79605 Pain in left leg: Secondary | ICD-10-CM | POA: Diagnosis not present

## 2014-12-21 DIAGNOSIS — M545 Low back pain: Secondary | ICD-10-CM | POA: Diagnosis not present

## 2014-12-21 DIAGNOSIS — M79652 Pain in left thigh: Secondary | ICD-10-CM | POA: Diagnosis not present

## 2014-12-21 DIAGNOSIS — M79651 Pain in right thigh: Secondary | ICD-10-CM | POA: Diagnosis not present

## 2014-12-21 DIAGNOSIS — M79604 Pain in right leg: Secondary | ICD-10-CM | POA: Diagnosis not present

## 2014-12-26 ENCOUNTER — Other Ambulatory Visit: Payer: Self-pay | Admitting: Nurse Practitioner

## 2014-12-29 ENCOUNTER — Ambulatory Visit: Payer: Medicare Other | Admitting: Family Medicine

## 2015-01-03 ENCOUNTER — Encounter: Payer: Self-pay | Admitting: Family Medicine

## 2015-01-05 DIAGNOSIS — F331 Major depressive disorder, recurrent, moderate: Secondary | ICD-10-CM | POA: Diagnosis not present

## 2015-01-07 DIAGNOSIS — M545 Low back pain: Secondary | ICD-10-CM | POA: Diagnosis not present

## 2015-01-07 DIAGNOSIS — M5441 Lumbago with sciatica, right side: Secondary | ICD-10-CM | POA: Diagnosis not present

## 2015-01-07 DIAGNOSIS — G890 Central pain syndrome: Secondary | ICD-10-CM | POA: Diagnosis not present

## 2015-01-07 DIAGNOSIS — M5412 Radiculopathy, cervical region: Secondary | ICD-10-CM | POA: Diagnosis not present

## 2015-01-07 DIAGNOSIS — Z79899 Other long term (current) drug therapy: Secondary | ICD-10-CM | POA: Diagnosis not present

## 2015-01-10 DIAGNOSIS — M545 Low back pain: Secondary | ICD-10-CM | POA: Diagnosis not present

## 2015-01-10 DIAGNOSIS — G894 Chronic pain syndrome: Secondary | ICD-10-CM | POA: Diagnosis not present

## 2015-01-10 DIAGNOSIS — M542 Cervicalgia: Secondary | ICD-10-CM | POA: Diagnosis not present

## 2015-01-10 DIAGNOSIS — M4326 Fusion of spine, lumbar region: Secondary | ICD-10-CM | POA: Diagnosis not present

## 2015-01-18 ENCOUNTER — Ambulatory Visit: Payer: 59 | Admitting: Neurology

## 2015-01-25 ENCOUNTER — Ambulatory Visit: Payer: 59 | Admitting: Neurology

## 2015-01-27 ENCOUNTER — Other Ambulatory Visit: Payer: Self-pay | Admitting: Nurse Practitioner

## 2015-02-03 ENCOUNTER — Telehealth: Payer: Self-pay

## 2015-02-03 NOTE — Telephone Encounter (Signed)
Insurance prior authorized Qvar through 05/28/15 

## 2015-02-09 DIAGNOSIS — M4326 Fusion of spine, lumbar region: Secondary | ICD-10-CM | POA: Diagnosis not present

## 2015-02-09 DIAGNOSIS — M5106 Intervertebral disc disorders with myelopathy, lumbar region: Secondary | ICD-10-CM | POA: Diagnosis not present

## 2015-02-09 DIAGNOSIS — G894 Chronic pain syndrome: Secondary | ICD-10-CM | POA: Diagnosis not present

## 2015-02-09 DIAGNOSIS — M791 Myalgia: Secondary | ICD-10-CM | POA: Diagnosis not present

## 2015-02-09 DIAGNOSIS — M542 Cervicalgia: Secondary | ICD-10-CM | POA: Diagnosis not present

## 2015-02-11 ENCOUNTER — Other Ambulatory Visit: Payer: Self-pay | Admitting: Orthopaedic Surgery

## 2015-02-11 DIAGNOSIS — M5106 Intervertebral disc disorders with myelopathy, lumbar region: Secondary | ICD-10-CM

## 2015-02-15 ENCOUNTER — Telehealth: Payer: Self-pay | Admitting: Family Medicine

## 2015-02-16 NOTE — Telephone Encounter (Signed)
Do you know what this is?

## 2015-02-16 NOTE — Telephone Encounter (Signed)
TC back to pt, not sure of the name, it maybe the nuvaring. She just knows she needs to be on something. She has an appt for 9/28 with Dr. Sabra Heck, she will be keeping this appt.

## 2015-02-16 NOTE — Telephone Encounter (Signed)
Not sure what she is talking about- nuvaring?

## 2015-02-22 ENCOUNTER — Other Ambulatory Visit: Payer: Self-pay

## 2015-02-22 MED ORDER — FUROSEMIDE 40 MG PO TABS: ORAL_TABLET | ORAL | Status: DC

## 2015-02-23 ENCOUNTER — Ambulatory Visit: Payer: Medicare Other | Admitting: Family Medicine

## 2015-02-25 ENCOUNTER — Other Ambulatory Visit: Payer: 59

## 2015-03-02 ENCOUNTER — Ambulatory Visit: Payer: Medicare Other | Admitting: Family Medicine

## 2015-03-04 ENCOUNTER — Other Ambulatory Visit: Payer: 59

## 2015-03-08 ENCOUNTER — Ambulatory Visit: Payer: Medicare Other | Admitting: Family Medicine

## 2015-03-11 ENCOUNTER — Ambulatory Visit
Admission: RE | Admit: 2015-03-11 | Discharge: 2015-03-11 | Disposition: A | Discharge status: Home / Self Care | Payer: Medicare Other | Source: Ambulatory Visit | Attending: Orthopaedic Surgery | Admitting: Orthopaedic Surgery

## 2015-03-11 DIAGNOSIS — M4806 Spinal stenosis, lumbar region: Secondary | ICD-10-CM | POA: Diagnosis not present

## 2015-03-11 DIAGNOSIS — M5106 Intervertebral disc disorders with myelopathy, lumbar region: Secondary | ICD-10-CM

## 2015-03-11 MED ORDER — ONDANSETRON HCL 4 MG/2ML IJ SOLN
4.0000 mg | Freq: Once | INTRAMUSCULAR | Status: AC
  Administered 2015-03-11: 4 mg via INTRAMUSCULAR

## 2015-03-11 MED ORDER — DIAZEPAM 5 MG PO TABS
10.0000 mg | ORAL_TABLET | Freq: Once | ORAL | Status: AC
  Administered 2015-03-11: 5 mg via ORAL

## 2015-03-11 MED ORDER — IOHEXOL 180 MG/ML  SOLN
17.0000 mL | Freq: Once | INTRAMUSCULAR | Status: DC | PRN
  Administered 2015-03-11: 17 mL via INTRATHECAL

## 2015-03-11 MED ORDER — MEPERIDINE HCL 100 MG/ML IJ SOLN
75.0000 mg | Freq: Once | INTRAMUSCULAR | Status: AC
  Administered 2015-03-11: 75 mg via INTRAMUSCULAR

## 2015-03-11 NOTE — Discharge Instructions (Signed)
Myelogram Discharge Instructions  1. Go home and rest quietly for the next 24 hours.  It is important to lie flat for the next 24 hours.  Get up only to go to the restroom.  You may lie in the bed or on a couch on your back, your stomach, your left side or your right side.  You may have one pillow under your head.  You may have pillows between your knees while you are on your side or under your knees while you are on your back.  2. DO NOT drive today.  Recline the seat as far back as it will go, while still wearing your seat belt, on the way home.  3. You may get up to go to the bathroom as needed.  You may sit up for 10 minutes to eat.  You may resume your normal diet and medications unless otherwise indicated.  Drink plenty of extra fluids today and tomorrow.  4. The incidence of a spinal headache with nausea and/or vomiting is about 5% (one in 20 patients).  If you develop a headache, lie flat and drink plenty of fluids until the headache goes away.  Caffeinated beverages may be helpful.  If you develop severe nausea and vomiting or a headache that does not go away with flat bed rest, call [phone].  5. You may resume normal activities after your 24 hours of bed rest is over; however, do not exert yourself strongly or do any heavy lifting tomorrow.  6. Call your physician for a follow-up appointment.   You may resume Fluoxetine, Imipramine and Ziprasidone on Saturday, March 12, 2015 after 8:30a.m.

## 2015-03-11 NOTE — Progress Notes (Signed)
Patient states she has been off Fluoxetine, Imipramine and Ziprasidone for the past two days.  jkl

## 2015-03-14 ENCOUNTER — Other Ambulatory Visit: Payer: Self-pay | Admitting: Family Medicine

## 2015-03-16 DIAGNOSIS — M4806 Spinal stenosis, lumbar region: Secondary | ICD-10-CM | POA: Diagnosis not present

## 2015-03-16 DIAGNOSIS — M5126 Other intervertebral disc displacement, lumbar region: Secondary | ICD-10-CM | POA: Diagnosis not present

## 2015-03-16 DIAGNOSIS — M810 Age-related osteoporosis without current pathological fracture: Secondary | ICD-10-CM | POA: Diagnosis not present

## 2015-03-16 DIAGNOSIS — M4716 Other spondylosis with myelopathy, lumbar region: Secondary | ICD-10-CM | POA: Diagnosis not present

## 2015-03-23 ENCOUNTER — Other Ambulatory Visit: Payer: Self-pay | Admitting: Family Medicine

## 2015-04-04 DIAGNOSIS — M4716 Other spondylosis with myelopathy, lumbar region: Secondary | ICD-10-CM | POA: Diagnosis not present

## 2015-04-04 DIAGNOSIS — M5126 Other intervertebral disc displacement, lumbar region: Secondary | ICD-10-CM | POA: Diagnosis not present

## 2015-04-08 ENCOUNTER — Other Ambulatory Visit: Payer: Self-pay | Admitting: Nurse Practitioner

## 2015-04-09 ENCOUNTER — Other Ambulatory Visit: Payer: Self-pay | Admitting: Family

## 2015-04-15 DIAGNOSIS — M461 Sacroiliitis, not elsewhere classified: Secondary | ICD-10-CM | POA: Diagnosis not present

## 2015-04-15 DIAGNOSIS — M5126 Other intervertebral disc displacement, lumbar region: Secondary | ICD-10-CM | POA: Diagnosis not present

## 2015-04-19 ENCOUNTER — Ambulatory Visit: Payer: Medicare Other

## 2015-04-27 ENCOUNTER — Other Ambulatory Visit: Payer: Self-pay

## 2015-04-27 MED ORDER — GEMFIBROZIL 600 MG PO TABS: 600.0000 mg | ORAL_TABLET | Freq: Two times a day (BID) | ORAL | Status: DC

## 2015-05-03 DIAGNOSIS — Z79899 Other long term (current) drug therapy: Secondary | ICD-10-CM | POA: Diagnosis not present

## 2015-05-03 DIAGNOSIS — M4326 Fusion of spine, lumbar region: Secondary | ICD-10-CM | POA: Diagnosis not present

## 2015-05-03 DIAGNOSIS — M5106 Intervertebral disc disorders with myelopathy, lumbar region: Secondary | ICD-10-CM | POA: Diagnosis not present

## 2015-05-03 DIAGNOSIS — Z79891 Long term (current) use of opiate analgesic: Secondary | ICD-10-CM | POA: Diagnosis not present

## 2015-05-03 DIAGNOSIS — G894 Chronic pain syndrome: Secondary | ICD-10-CM | POA: Diagnosis not present

## 2015-05-04 DIAGNOSIS — M461 Sacroiliitis, not elsewhere classified: Secondary | ICD-10-CM | POA: Diagnosis not present

## 2015-05-05 ENCOUNTER — Other Ambulatory Visit: Payer: Self-pay | Admitting: Family

## 2015-05-09 ENCOUNTER — Ambulatory Visit (INDEPENDENT_AMBULATORY_CARE_PROVIDER_SITE_OTHER): Payer: Medicare Other | Admitting: *Deleted

## 2015-05-09 DIAGNOSIS — Z111 Encounter for screening for respiratory tuberculosis: Secondary | ICD-10-CM | POA: Diagnosis not present

## 2015-05-09 NOTE — Patient Instructions (Signed)
Tuberculin Skin Test WHY AM I HAVING THIS TEST? Tuberculosis (TB) is a bacterial infection caused by Mycobacterium tuberculosis. Most people who are exposed to these bacteria have a strong enough defense (immune) system to prevent the bacteria from causing TB and developing symptoms. Their bodies prevent the germs from being active and making them sick (latent TB infection).  However, if you have TB germs in your body and your immune system is weak, you can develop a TB infection. This can cause symptoms such as:   Night sweats.  Fever.  Weakness.  Weight loss. A latent TB infection can also become active later in life if your immune system becomes weakened or compromised. You may have this test if your health care provider suspects that you have TB. You may also have this test to screen for TB if you are at risk for getting the disease. Those at increased risk include:  People who inject illegal drugs or share needles.  People with HIV or other diseases that affect immunity.  Health care workers.  People who live in high-risk communities, such as homeless shelters, nursing homes, and correctional facilities.  People who have been in contact with someone with TB.  People from countries where TB is more common. If you are in a high-risk group, your health care provider may wish to screen for TB more often. This can help prevent the spread of the disease. Sometimes TB screening is required when starting a new job, such as becoming a health care worker or a teacher. Colleges or universities may require it of new students. HOW WILL I BE TESTED? A tuberculin skin test is the main test used to check for exposure to the bacteria that can cause TB. The test checks for antibodies to the bacteria. Antibodies are proteins that your body produces to protect you from germs and other things that can make you sick. Your health care provider will inject a solution known as PPD (purified protein  derivative) under the first layer of skin on your arm. This causes a blister-like bubble to form at the site. Your health care provider will then examine the site after a number of hours have passed to see if a reaction has occurred. HOW DO I PREPARE FOR THE TEST? There is no preparation required for this test. WHAT DO THE RESULTS MEAN? Your test results will be reported as either negative or positive.  If the tuberculin skin test produces a negative result, it is likely that you do not have TB and have not been exposed to the TB bacteria. If you or your health care provider suspects exposure, however, you may want to repeat the test a few weeks later. A blood test may also be used to check for TB. This is because you will not react to the tuberculin skin test until several weeks after exposure to TB bacteria. If you test positive to the tuberculin skin test, it is likely that you have been exposed to TB bacteria. The test does not distinguish between an active and a latent TB infection. A false-positive result can occur. A false-positive result for TB bacteria is incorrect because it indicates a condition or finding is present when it is not. Talk to your health care provider to discuss your results, treatment options, and if necessary, the need for more tests. It is your responsibility to obtain your test results. Ask the lab or department performing the test when and how you will get your results. Talk with your   health care provider if you have any questions about your results.   This information is not intended to replace advice given to you by your health care provider. Make sure you discuss any questions you have with your health care provider.   Document Released: 02/21/2005 Document Revised: 06/04/2014 Document Reviewed: 09/07/2013 Elsevier Interactive Patient Education 2016 [employer]  

## 2015-05-11 ENCOUNTER — Ambulatory Visit: Payer: Medicare Other

## 2015-05-17 DIAGNOSIS — M461 Sacroiliitis, not elsewhere classified: Secondary | ICD-10-CM | POA: Diagnosis not present

## 2015-05-26 ENCOUNTER — Other Ambulatory Visit: Payer: Self-pay

## 2015-05-26 DIAGNOSIS — M791 Myalgia: Secondary | ICD-10-CM | POA: Diagnosis not present

## 2015-05-26 DIAGNOSIS — Z79891 Long term (current) use of opiate analgesic: Secondary | ICD-10-CM | POA: Diagnosis not present

## 2015-05-26 DIAGNOSIS — M4806 Spinal stenosis, lumbar region: Secondary | ICD-10-CM | POA: Diagnosis not present

## 2015-05-26 DIAGNOSIS — M549 Dorsalgia, unspecified: Secondary | ICD-10-CM | POA: Diagnosis not present

## 2015-05-26 DIAGNOSIS — M4716 Other spondylosis with myelopathy, lumbar region: Secondary | ICD-10-CM | POA: Diagnosis not present

## 2015-05-26 DIAGNOSIS — G894 Chronic pain syndrome: Secondary | ICD-10-CM | POA: Diagnosis not present

## 2015-05-26 DIAGNOSIS — M545 Low back pain: Secondary | ICD-10-CM | POA: Diagnosis not present

## 2015-05-26 DIAGNOSIS — Z79899 Other long term (current) drug therapy: Secondary | ICD-10-CM | POA: Diagnosis not present

## 2015-05-26 MED ORDER — FUROSEMIDE 40 MG PO TABS: ORAL_TABLET | ORAL | Status: DC

## 2015-05-31 ENCOUNTER — Telehealth: Payer: Self-pay | Admitting: Family Medicine

## 2015-05-31 NOTE — Telephone Encounter (Signed)
scheduled

## 2015-06-02 ENCOUNTER — Ambulatory Visit (INDEPENDENT_AMBULATORY_CARE_PROVIDER_SITE_OTHER): Payer: Medicare Other

## 2015-06-02 ENCOUNTER — Other Ambulatory Visit (INDEPENDENT_AMBULATORY_CARE_PROVIDER_SITE_OTHER): Payer: Medicare Other | Admitting: *Deleted

## 2015-06-02 DIAGNOSIS — Z23 Encounter for immunization: Secondary | ICD-10-CM

## 2015-06-08 DIAGNOSIS — Z79891 Long term (current) use of opiate analgesic: Secondary | ICD-10-CM | POA: Diagnosis not present

## 2015-06-08 DIAGNOSIS — G894 Chronic pain syndrome: Secondary | ICD-10-CM | POA: Diagnosis not present

## 2015-06-17 ENCOUNTER — Telehealth: Payer: Self-pay | Admitting: Family Medicine

## 2015-06-21 ENCOUNTER — Other Ambulatory Visit: Payer: Self-pay | Admitting: Family Medicine

## 2015-06-22 NOTE — Telephone Encounter (Signed)
Last seen 12/15/14  [facility]

## 2015-06-23 NOTE — Telephone Encounter (Signed)
LMTCB with pt, if she uses this company, i will fax

## 2015-06-29 DIAGNOSIS — Z139 Encounter for screening, unspecified: Secondary | ICD-10-CM

## 2015-07-07 DIAGNOSIS — M545 Low back pain: Secondary | ICD-10-CM | POA: Diagnosis not present

## 2015-07-07 DIAGNOSIS — M5136 Other intervertebral disc degeneration, lumbar region: Secondary | ICD-10-CM | POA: Diagnosis not present

## 2015-07-07 DIAGNOSIS — M47817 Spondylosis without myelopathy or radiculopathy, lumbosacral region: Secondary | ICD-10-CM | POA: Diagnosis not present

## 2015-07-11 ENCOUNTER — Telehealth: Payer: Self-pay | Admitting: Family Medicine

## 2015-07-13 ENCOUNTER — Other Ambulatory Visit: Payer: Self-pay | Admitting: Family

## 2015-07-14 NOTE — Telephone Encounter (Signed)
Last seen 12/15/14  [facility]  Dr [name]  PCP

## 2015-07-15 NOTE — Telephone Encounter (Signed)
Authorize 30 days only. Then contact the patient letting them know that they will need an appointment before any further prescriptions can be sent in. 

## 2015-07-15 NOTE — Telephone Encounter (Signed)
Patient is due for follow-up this month. This is the last prescription that will be authorized before the patient is seen

## 2015-07-22 NOTE — Telephone Encounter (Signed)
sent 

## 2015-08-11 ENCOUNTER — Other Ambulatory Visit: Payer: Self-pay | Admitting: *Deleted

## 2015-08-11 MED ORDER — GEMFIBROZIL 600 MG PO TABS: 600.0000 mg | ORAL_TABLET | Freq: Two times a day (BID) | ORAL | Status: DC

## 2015-08-16 ENCOUNTER — Telehealth: Payer: Self-pay | Admitting: Family Medicine

## 2015-08-17 ENCOUNTER — Other Ambulatory Visit: Payer: Self-pay | Admitting: Family Medicine

## 2015-08-17 NOTE — Telephone Encounter (Signed)
Last seen 12/15/14 [facility]

## 2015-08-18 NOTE — Telephone Encounter (Signed)
We have forms, but pt must be seen. LMOM for pt

## 2015-08-29 ENCOUNTER — Encounter: Payer: Self-pay | Admitting: Family Medicine

## 2015-08-29 ENCOUNTER — Ambulatory Visit (INDEPENDENT_AMBULATORY_CARE_PROVIDER_SITE_OTHER): Payer: Medicare Other | Admitting: Family Medicine

## 2015-08-29 VITALS — BP 97/68 | HR 106 | Temp 96.8°F | Ht 62.0 in | Wt 163.6 lb

## 2015-08-29 DIAGNOSIS — N393 Stress incontinence (female) (male): Secondary | ICD-10-CM

## 2015-08-29 MED ORDER — MIRABEGRON ER 50 MG PO TB24: 50.0000 mg | ORAL_TABLET | Freq: Every day | ORAL | Status: DC

## 2015-08-29 NOTE — Progress Notes (Signed)
Subjective:  Patient ID: Darlene Wilcox, female    DOB: [birth year]  Age: 55 y.o. MRN: [id number]  CC: Urinary Incontinence   HPI Darlene Wilcox presents for wearing a pad because she wets herself a lot esp. With laugh, cough   History Kd has a past medical history of Anxiety; Seizures ([place]); Headache(784.0); Asthma; GERD (gastroesophageal reflux disease); Full dentures; Bipolar disorder ([place]); Depression; Chronic back pain greater than 3 months duration; Carpal tunnel syndrome of right wrist (12/2012); and History of ankle sprain.   She has past surgical history that includes Cervical fusion; Tubal ligation; Abdominal hysterectomy; Inguinal hernia repair (Right); Bunionectomy (Left); Diagnostic laparoscopy (11/08/2006); Laparoscopic appendectomy (11/08/2006); Lumbar laminectomy (02/05/2007); Carpal tunnel release (Right, 01/13/2013); Appendectomy; and Lumbar fusion (06/28/2014).   Her family history includes Diabetes in her maternal grandmother; Lung cancer in her father.She reports that she has been smoking Cigarettes.  She has a 3.75 pack-year smoking history. She has never used smokeless tobacco. She reports that she does not drink alcohol or use illicit drugs.  Current Outpatient Prescriptions on File Prior to Visit  Medication Sig Dispense Refill  . baclofen (LIORESAL) 10 MG tablet Take 10 mg by mouth 3 (three) times daily.   2  . clonazePAM (KLONOPIN) 1 MG tablet Take 1 tablet (1 mg total) by mouth 3 (three) times daily. 30 tablet 3  . clotrimazole (MYCELEX) 10 MG troche Take 1 tablet (10 mg total) by mouth 5 (five) times daily. 35 tablet 1  . divalproex (DEPAKOTE ER) 500 MG 24 hr tablet Take 500 mg by mouth at bedtime.    Marland Kitchen doxycycline (VIBRA-TABS) 100 MG tablet Take 1 tablet (100 mg total) by mouth 2 (two) times daily. 20 tablet 0  . esomeprazole (NEXIUM) 40 MG capsule Take 1 capsule (40 mg total) by mouth 2 (two) times daily before a meal. 60 capsule 5  . FLUoxetine (PROZAC) 20  MG capsule Take 60 mg by mouth daily.     . furosemide (LASIX) 40 MG tablet TAKE 1 TABLET (40 MG TOTAL) BY MOUTH DAILY. 30 tablet 0  . gabapentin (NEURONTIN) 300 MG capsule Take 300 mg by mouth 3 (three) times daily.     Marland Kitchen gemfibrozil (LOPID) 600 MG tablet Take 1 tablet (600 mg total) by mouth 2 (two) times daily before a meal. 60 tablet 2  . imipramine (TOFRANIL) 50 MG tablet TAKE 2 TABLETS BY MOUTH AT BEDTIME 60 tablet 0  . lamoTRIgine (LAMICTAL) 25 MG tablet   3  . methocarbamol (ROBAXIN) 500 MG tablet Take 500 mg by mouth 3 (three) times daily.     . Multiple Vitamin (MULTIVITAMIN WITH MINERALS) TABS tablet Take 1 tablet by mouth daily.    . mupirocin ointment (BACTROBAN) 2 % Apply 1 application topically 2 (two) times daily. 22 g 0  . naloxone (NARCAN) 1 MG/ML injection USE AS DIRECTED FOR OPIOID EMERGENCY  0  . naproxen (NAPROSYN) 500 MG tablet TAKE 1 TABLET BY MOUTH TWICE A DAY WITH A MEAL 60 tablet 0  . NEXIUM 40 MG capsule TAKE 1 CAPSULE (40 MG TOTAL) BY MOUTH DAILY. 90 capsule 0  . oxcarbazepine (TRILEPTAL) 600 MG tablet Take 600 mg by mouth at bedtime.     . prazosin (MINIPRESS) 5 MG capsule Take 5 mg by mouth at bedtime.  3  . PROAIR HFA 108 (90 BASE) MCG/ACT inhaler INHALE 2 PUFFS INTO THE LUNGS EVERY 4 (FOUR) HOURS AS NEEDED FOR WHEEZING OR SHORTNESS OF BREATH. 8.5 Inhaler 2  .  QVAR 80 MCG/ACT inhaler INHALE 2 PUFFS INTO THE LUNGS 2 (TWO) TIMES DAILY. 8.7 g 0  . tiZANidine (ZANAFLEX) 4 MG tablet Take 4 mg by mouth every 6 (six) hours as needed for muscle spasms.   0  . topiramate (TOPAMAX) 200 MG tablet Take 200 mg by mouth at bedtime.  2  . ziprasidone (GEODON) 80 MG capsule Take 160 mg by mouth at bedtime.     . [DISCONTINUED] metolazone (ZAROXOLYN) 2.5 MG tablet Take 1 tablet (2.5 mg total) by mouth daily. (Patient not taking: Reported on 04/09/2014) 3 tablet 1   No current facility-administered medications on file prior to visit.    ROS Review of Systems  Genitourinary:  Positive for frequency. Negative for dysuria, urgency, hematuria, flank pain, decreased urine volume, menstrual problem and pelvic pain.  Musculoskeletal: Negative for joint swelling and arthralgias.  Skin: Negative for rash.    Objective:  BP 97/68 mmHg  Pulse 106  Temp(Src) 96.8 F (36 C) (Oral)  Ht 5\' 2"  (1.575 m)  Wt 163 lb 9.6 oz (74.208 kg)  BMI 29.92 kg/m2  SpO2 96%  BP Readings from Last 3 Encounters:  08/29/15 97/68  03/11/15 101/62  12/15/14 113/74    Wt Readings from Last 3 Encounters:  08/29/15 163 lb 9.6 oz (74.208 kg)  12/15/14 197 lb 3.2 oz (89.449 kg)  12/09/14 197 lb 3.2 oz (89.449 kg)     Physical Exam  Abdominal: Soft. Bowel sounds are normal. She exhibits no distension. There is no tenderness.     Lab Results  Component Value Date   WBC 9.7 06/25/2014   HGB 14.4 06/25/2014   HCT 43.3 06/25/2014   PLT 273 06/25/2014   GLUCOSE 95 07/30/2014   CHOL 219* 07/30/2014   TRIG 359* 07/30/2014   HDL 33* 07/30/2014   LDLCALC Comment 11/03/2013   ALT 19 07/30/2014   AST 11 07/30/2014   NA 139 07/30/2014   K 4.2 07/30/2014   CL 102 07/30/2014   CREATININE 0.67 07/30/2014   BUN 25* 07/30/2014   CO2 23 07/30/2014   TSH 1.680 06/25/2014   INR 1.0 02/03/2007   HGBA1C 5.2 07/30/2014    Ct Lumbar Spine W Contrast  03/11/2015  CLINICAL DATA:  Low back and left lower extremity pain Previous lumbar fusion surgery. EXAM: LUMBAR MYELOGRAM CT LUMBAR SPINE WITH INTRATHECAL CONTRAST FLUOROSCOPY TIME:  73 seconds TECHNIQUE: The procedure, risks (including but not limited to bleeding, infection, organ damage ), benefits, and alternatives were explained to the patient. Questions regarding the procedure were encouraged and answered. The patient understands and consents to the procedure. An appropriate entry site was determined under fluoroscopy. Operator donned sterile gloves and mask. Skin site was marked, prepped with Betadine, and draped in usual sterile fashion,  and infiltrated locally with 1% lidocaine. A 22 gauge spinal needle was advanced into the thecal sac at L4-5 from a left parasagittal approach. Clear colorless CSF returned. 17 ml Omnipaque 180 were administered intrathecally for lumbar myelography, followed by axial CT scanning of the lumbar spine. I personally performed the lumbar puncture and administered the intrathecal contrast. I also personally supervised acquisition of the myelogram images. Coronal and sagittal reconstructions were generated from the axial scan. COMPARISON:  MR 12/25/2013 FINDINGS: Numbering scheme follows that used on prior studies, the 5 non rib-bearing lumbar segments labeled L1-L5. Normal alignment. No dynamic instability on standing lateral flexion-extension radiographs. Negative for fracture. T12-L1: Interspace unremarkable. Central canal and foramina patent. Conus terminates behind L1. L1-2:  Mild circumferential disc bulge. Early bilateral facet degenerative spurring with some thickening of ligamentum flavum contributing to mild central canal stenosis. Bilateral foraminal encroachment. L2-3: Mild circumferential disc bulge. Early bilateral facet degenerative spurring with some thickening of ligamentum flavum contributing to mild- moderate central canal stenosis and subarticular recess narrowing left right greater than left, as well as bilateral foraminal encroachment. L3-4: Previous PLIF with bilateral pedicle screws at each level, vertical interconnecting rods, intact without surrounding lucency. Previous right facetectomy. Graft in the interspace with mild subsidence into the endplates, no convincing solid bone bridging across the interspace. Central canal and foramina patent. L4-5: Continuation of PLIF, bilateral L5 pedicle screws and hardware intact without surrounding lucency. Graft markers in the interspace with solid-appearing fusion across the interspace and posterior elements. Central canal and foramina patent. L5-S1: Mild  disc bulge. Early facet degenerative changes and spurring, with left foraminal narrowing. Central canal patent. Patchy aortoiliac arterial calcifications. Remainder visualized paraspinal soft tissues unremarkable. IMPRESSION: 1. Postop changes of PLIF L3-4 and L4-5 as above, without evident complication. 2. Multifactorial spinal stenosis, mild at L1-2 and moderate L2-3. 3. Mild facet disease L5-S1 with left foraminal encroachment. Electronically Signed   By: Lucrezia Europe M.D.   On: 03/11/2015 10:21   Dg Myelography Lumbar Inj Lumbosacral  03/11/2015  CLINICAL DATA:  Low back and left lower extremity pain Previous lumbar fusion surgery. EXAM: LUMBAR MYELOGRAM CT LUMBAR SPINE WITH INTRATHECAL CONTRAST FLUOROSCOPY TIME:  73 seconds TECHNIQUE: The procedure, risks (including but not limited to bleeding, infection, organ damage ), benefits, and alternatives were explained to the patient. Questions regarding the procedure were encouraged and answered. The patient understands and consents to the procedure. An appropriate entry site was determined under fluoroscopy. Operator donned sterile gloves and mask. Skin site was marked, prepped with Betadine, and draped in usual sterile fashion, and infiltrated locally with 1% lidocaine. A 22 gauge spinal needle was advanced into the thecal sac at L4-5 from a left parasagittal approach. Clear colorless CSF returned. 17 ml Omnipaque 180 were administered intrathecally for lumbar myelography, followed by axial CT scanning of the lumbar spine. I personally performed the lumbar puncture and administered the intrathecal contrast. I also personally supervised acquisition of the myelogram images. Coronal and sagittal reconstructions were generated from the axial scan. COMPARISON:  MR 12/25/2013 FINDINGS: Numbering scheme follows that used on prior studies, the 5 non rib-bearing lumbar segments labeled L1-L5. Normal alignment. No dynamic instability on standing lateral flexion-extension  radiographs. Negative for fracture. T12-L1: Interspace unremarkable. Central canal and foramina patent. Conus terminates behind L1. L1-2: Mild circumferential disc bulge. Early bilateral facet degenerative spurring with some thickening of ligamentum flavum contributing to mild central canal stenosis. Bilateral foraminal encroachment. L2-3: Mild circumferential disc bulge. Early bilateral facet degenerative spurring with some thickening of ligamentum flavum contributing to mild- moderate central canal stenosis and subarticular recess narrowing left right greater than left, as well as bilateral foraminal encroachment. L3-4: Previous PLIF with bilateral pedicle screws at each level, vertical interconnecting rods, intact without surrounding lucency. Previous right facetectomy. Graft in the interspace with mild subsidence into the endplates, no convincing solid bone bridging across the interspace. Central canal and foramina patent. L4-5: Continuation of PLIF, bilateral L5 pedicle screws and hardware intact without surrounding lucency. Graft markers in the interspace with solid-appearing fusion across the interspace and posterior elements. Central canal and foramina patent. L5-S1: Mild disc bulge. Early facet degenerative changes and spurring, with left foraminal narrowing. Central canal patent. Patchy aortoiliac arterial calcifications.  Remainder visualized paraspinal soft tissues unremarkable. IMPRESSION: 1. Postop changes of PLIF L3-4 and L4-5 as above, without evident complication. 2. Multifactorial spinal stenosis, mild at L1-2 and moderate L2-3. 3. Mild facet disease L5-S1 with left foraminal encroachment. Electronically Signed   By: Lucrezia Europe M.D.   On: 03/11/2015 10:21    Assessment & Plan:   Dalya was seen today for urinary incontinence.  Diagnoses and all orders for this visit:  Stress incontinence -     Urinalysis, Complete  Other orders -     mirabegron ER (MYRBETRIQ) 50 MG TB24 tablet; Take 1  tablet (50 mg total) by mouth daily. For bladder     I have discontinued Ms. Prickett's HYDROcodone-acetaminophen, oxyCODONE-acetaminophen, methylPREDNISolone, sulfamethoxazole-trimethoprim, Oxycodone HCl, and metroNIDAZOLE. I am also having her start on mirabegron ER. Additionally, I am having her maintain her oxcarbazepine, FLUoxetine, ziprasidone, multivitamin with minerals, methocarbamol, esomeprazole, baclofen, tiZANidine, topiramate, divalproex, clonazePAM, clotrimazole, lamoTRIgine, prazosin, gabapentin, mupirocin ointment, doxycycline, PROAIR HFA, naloxone, NEXIUM, QVAR, naproxen, imipramine, gemfibrozil, and furosemide.  Meds ordered this encounter  Medications  . mirabegron ER (MYRBETRIQ) 50 MG TB24 tablet    Sig: Take 1 tablet (50 mg total) by mouth daily. For bladder    Dispense:  30 tablet    Refill:  5     Follow-up: No Follow-up on file.  Claretta Fraise, M.D.

## 2015-08-30 ENCOUNTER — Other Ambulatory Visit: Payer: Self-pay | Admitting: Family Medicine

## 2015-09-01 DIAGNOSIS — N393 Stress incontinence (female) (male): Secondary | ICD-10-CM | POA: Diagnosis not present

## 2015-09-25 ENCOUNTER — Other Ambulatory Visit: Payer: Self-pay | Admitting: Family

## 2015-09-27 ENCOUNTER — Telehealth: Payer: Self-pay

## 2015-09-27 MED ORDER — FLUTICASONE PROPIONATE HFA 110 MCG/ACT IN AERO: 2.0000 | INHALATION_SPRAY | Freq: Two times a day (BID) | RESPIRATORY_TRACT | Status: DC

## 2015-09-27 NOTE — Telephone Encounter (Signed)
Flovent has been sent to the pharmacy.  Please let the patient know. Thanks, WS

## 2015-09-27 NOTE — Telephone Encounter (Signed)
Qvar non formulary now on patients insurance   Formulary are Spiriva and Flovent

## 2015-09-27 NOTE — Telephone Encounter (Signed)
Detailed message left for patient.

## 2015-10-03 ENCOUNTER — Other Ambulatory Visit: Payer: Self-pay | Admitting: Family Medicine

## 2015-10-03 DIAGNOSIS — N393 Stress incontinence (female) (male): Secondary | ICD-10-CM | POA: Diagnosis not present

## 2015-10-06 ENCOUNTER — Other Ambulatory Visit: Payer: Self-pay | Admitting: Family Medicine

## 2015-10-15 ENCOUNTER — Encounter (HOSPITAL_COMMUNITY): Payer: Self-pay | Admitting: *Deleted

## 2015-10-15 ENCOUNTER — Emergency Department (HOSPITAL_COMMUNITY)
Admission: EM | Admit: 2015-10-15 | Discharge: 2015-10-16 | Disposition: A | Discharge status: Home / Self Care | Payer: Medicare Other | Attending: Emergency Medicine | Admitting: Emergency Medicine

## 2015-10-15 ENCOUNTER — Emergency Department (HOSPITAL_COMMUNITY): Payer: Medicare Other

## 2015-10-15 DIAGNOSIS — F319 Bipolar disorder, unspecified: Secondary | ICD-10-CM | POA: Insufficient documentation

## 2015-10-15 DIAGNOSIS — E860 Dehydration: Secondary | ICD-10-CM | POA: Insufficient documentation

## 2015-10-15 DIAGNOSIS — F17210 Nicotine dependence, cigarettes, uncomplicated: Secondary | ICD-10-CM | POA: Diagnosis not present

## 2015-10-15 DIAGNOSIS — G8929 Other chronic pain: Secondary | ICD-10-CM | POA: Diagnosis not present

## 2015-10-15 DIAGNOSIS — K219 Gastro-esophageal reflux disease without esophagitis: Secondary | ICD-10-CM | POA: Diagnosis not present

## 2015-10-15 DIAGNOSIS — R404 Transient alteration of awareness: Secondary | ICD-10-CM | POA: Diagnosis not present

## 2015-10-15 DIAGNOSIS — I959 Hypotension, unspecified: Secondary | ICD-10-CM | POA: Insufficient documentation

## 2015-10-15 DIAGNOSIS — Z880 Allergy status to penicillin: Secondary | ICD-10-CM | POA: Insufficient documentation

## 2015-10-15 DIAGNOSIS — N390 Urinary tract infection, site not specified: Secondary | ICD-10-CM

## 2015-10-15 DIAGNOSIS — Z791 Long term (current) use of non-steroidal anti-inflammatories (NSAID): Secondary | ICD-10-CM | POA: Insufficient documentation

## 2015-10-15 DIAGNOSIS — R531 Weakness: Secondary | ICD-10-CM | POA: Diagnosis not present

## 2015-10-15 DIAGNOSIS — Z87828 Personal history of other (healed) physical injury and trauma: Secondary | ICD-10-CM | POA: Insufficient documentation

## 2015-10-15 DIAGNOSIS — M545 Low back pain: Secondary | ICD-10-CM | POA: Diagnosis not present

## 2015-10-15 DIAGNOSIS — R42 Dizziness and giddiness: Secondary | ICD-10-CM | POA: Diagnosis not present

## 2015-10-15 DIAGNOSIS — Z79899 Other long term (current) drug therapy: Secondary | ICD-10-CM | POA: Diagnosis not present

## 2015-10-15 DIAGNOSIS — Z7951 Long term (current) use of inhaled steroids: Secondary | ICD-10-CM | POA: Diagnosis not present

## 2015-10-15 DIAGNOSIS — J45909 Unspecified asthma, uncomplicated: Secondary | ICD-10-CM | POA: Diagnosis not present

## 2015-10-15 LAB — COMPREHENSIVE METABOLIC PANEL
ALBUMIN: 3.2 g/dL — AB (ref 3.5–5.0)
ALT: 13 U/L — ABNORMAL LOW (ref 14–54)
AST: 16 U/L (ref 15–41)
Alkaline Phosphatase: 83 U/L (ref 38–126)
Anion gap: 10 (ref 5–15)
BILIRUBIN TOTAL: 0.3 mg/dL (ref 0.3–1.2)
BUN: 10 mg/dL (ref 6–20)
CHLORIDE: 110 mmol/L (ref 101–111)
CO2: 23 mmol/L (ref 22–32)
Calcium: 8.7 mg/dL — ABNORMAL LOW (ref 8.9–10.3)
Creatinine, Ser: 1.32 mg/dL — ABNORMAL HIGH (ref 0.44–1.00)
GFR calc Af Amer: 52 mL/min — ABNORMAL LOW (ref >60)
GFR calc non Af Amer: 45 mL/min — ABNORMAL LOW (ref >60)
GLUCOSE: 91 mg/dL (ref 65–99)
POTASSIUM: 3.2 mmol/L — AB (ref 3.5–5.1)
Sodium: 143 mmol/L (ref 135–145)
Total Protein: 5.6 g/dL — ABNORMAL LOW (ref 6.5–8.1)

## 2015-10-15 LAB — CBC WITH DIFFERENTIAL/PLATELET
BASOS ABS: 0 10*3/uL (ref 0.0–0.1)
BASOS PCT: 0 %
Eosinophils Absolute: 0.1 10*3/uL (ref 0.0–0.7)
Eosinophils Relative: 1 %
HEMATOCRIT: 37.5 % (ref 36.0–46.0)
Hemoglobin: 12 g/dL (ref 12.0–15.0)
Lymphocytes Relative: 18 %
Lymphs Abs: 2.1 10*3/uL (ref 0.7–4.0)
MCH: 28 pg (ref 26.0–34.0)
MCHC: 32 g/dL (ref 30.0–36.0)
MCV: 87.4 fL (ref 78.0–100.0)
MONO ABS: 0.9 10*3/uL (ref 0.1–1.0)
Monocytes Relative: 8 %
NEUTROS ABS: 8.3 10*3/uL — AB (ref 1.7–7.7)
Neutrophils Relative %: 73 %
Platelets: 177 10*3/uL (ref 150–400)
RBC: 4.29 MIL/uL (ref 3.87–5.11)
RDW: 13.8 % (ref 11.5–15.5)
WBC: 11.4 10*3/uL — AB (ref 4.0–10.5)

## 2015-10-15 LAB — URINALYSIS, ROUTINE W REFLEX MICROSCOPIC
BILIRUBIN URINE: NEGATIVE
Glucose, UA: NEGATIVE mg/dL
Ketones, ur: NEGATIVE mg/dL
NITRITE: NEGATIVE
Protein, ur: NEGATIVE mg/dL
SPECIFIC GRAVITY, URINE: 1.008 (ref 1.005–1.030)
pH: 6 (ref 5.0–8.0)

## 2015-10-15 LAB — I-STAT CG4 LACTIC ACID, ED
LACTIC ACID, VENOUS: 1.51 mmol/L (ref 0.5–2.0)
Lactic Acid, Venous: 0.93 mmol/L (ref 0.5–2.0)

## 2015-10-15 LAB — I-STAT TROPONIN, ED: Troponin i, poc: 0 ng/mL (ref 0.00–0.08)

## 2015-10-15 LAB — URINE MICROSCOPIC-ADD ON

## 2015-10-15 LAB — VALPROIC ACID LEVEL: Valproic Acid Lvl: <10 ug/mL — ABNORMAL LOW (ref 50.0–100.0)

## 2015-10-15 MED ORDER — FOSFOMYCIN TROMETHAMINE 3 G PO PACK
3.0000 g | PACK | Freq: Once | ORAL | Status: AC
  Administered 2015-10-15: 3 g via ORAL
  Filled 2015-10-15: qty 3

## 2015-10-15 MED ORDER — SODIUM CHLORIDE 0.9 % IV BOLUS (SEPSIS)
1000.0000 mL | Freq: Once | INTRAVENOUS | Status: AC
  Administered 2015-10-15: 1000 mL via INTRAVENOUS

## 2015-10-15 MED ORDER — POTASSIUM CHLORIDE 20 MEQ PO PACK: 40.0000 meq | PACK | Freq: Two times a day (BID) | ORAL | Status: DC

## 2015-10-15 MED ORDER — POTASSIUM CHLORIDE CRYS ER 20 MEQ PO TBCR
40.0000 meq | EXTENDED_RELEASE_TABLET | Freq: Once | ORAL | Status: AC
  Administered 2015-10-15: 40 meq via ORAL
  Filled 2015-10-15: qty 2

## 2015-10-15 NOTE — Discharge Instructions (Signed)
You had a urinary infection identified under laboratory workup today. You were treated with a one-time dose of antibiotics in the emergency department. You do not need further antibiotics.   Follow-up with your regular doctor this week for reevaluation and recheck of your blood pressure.  Return to the ED for worsening dizziness, passing out or other new or worsening symptoms.

## 2015-10-15 NOTE — ED Provider Notes (Signed)
CSN: [id number]     Arrival date & time 10/15/15  1829 History   First MD Initiated Contact with Patient 10/15/15 1849     Chief Complaint  Patient presents with  . Weakness     (Consider location/radiation/quality/duration/timing/severity/associated sxs/prior Treatment) The history is provided by the patient and medical records. No language interpreter was used.   Patient is a 55 year old female with past medical history of chronic back pain, GERD, bipolar disorder, "silent seizures", who presents for generalized weakness, dizziness and hypotension. Patient reports that she has had several months of generalized fatigue and lightheadedness especially notable when she moves from sitting to standing. She describes her dizziness as both lightheaded and as if the room is spinning and she feels off balance. This morning her symptoms became markedly worse. She checked her blood pressure with a home blood pressure cuff and it was 50/40. She called EMS. Upon fire arrival patient's blood pressure was measured 68/40 with a heart rate of 122 and positive orthostasis. Blood glucose was 145. EKG showed sinus tachycardia. Patient received 1 L of normal saline on the way to the emergency department with improvement in her blood pressures. Blood pressure was 103/58 upon arrival. Patient denies recent fever, chills, congestion, chest pain, shortness of breath, cough, vomiting, diarrhea or other symptoms. She has some chronic urinary incontinence which has been worsening. Denies any recent medication changes.  Past Medical History  Diagnosis Date  . Anxiety   . Seizures ([place])     states "silent seizures"; is on anticonvulsant; none in 2-3 mos.  . Headache(784.0)     1-2 x/month  . Asthma     daily inhaler  . GERD (gastroesophageal reflux disease)   . Full dentures   . Bipolar disorder ([place])   . Depression   . Chronic back pain greater than 3 months duration   . Carpal tunnel syndrome of right wrist 12/2012   . History of ankle sprain     right; 2 weeks ago;  c/o severe pain   Past Surgical History  Procedure Laterality Date  . Cervical fusion      x 2  . Tubal ligation    . Abdominal hysterectomy      partial  . Inguinal hernia repair Right   . Bunionectomy Left   . Diagnostic laparoscopy  11/08/2006  . Laparoscopic appendectomy  11/08/2006  . Lumbar laminectomy  02/05/2007    L4-5 fusion  . Carpal tunnel release Right 01/13/2013    Procedure: CARPAL TUNNEL RELEASE;  Surgeon: Cammie Sickle., MD;  Location: [place];  Service: Orthopedics;  Laterality: Right;  . Appendectomy    . Lumbar fusion  06/28/2014    L2-5   Family History  Problem Relation Age of Onset  . Diabetes Maternal Grandmother   . Lung cancer Father    Social History  Substance Use Topics  . Smoking status: Current Some Day Smoker -- 0.25 packs/day for 15 years    Types: Cigarettes  . Smokeless tobacco: Never Used     Comment: pt trying to quit (04/12/2014)  . Alcohol Use: No   OB History    No data available     Review of Systems  Constitutional: Positive for fatigue. Negative for fever and chills.  HENT: Negative for congestion and rhinorrhea.   Eyes: Negative for visual disturbance.  Respiratory: Negative for cough and shortness of breath.   Cardiovascular: Negative for chest pain and leg swelling.  Gastrointestinal: Negative for nausea,  vomiting, abdominal pain, diarrhea and constipation.  Genitourinary: Negative for dysuria and vaginal bleeding.       Urinary incontinence at baseline.  Musculoskeletal: Positive for back pain (chronic back pain). Negative for neck pain.  Skin: Negative for pallor and rash.  Neurological: Positive for dizziness and light-headedness. Negative for syncope and headaches.  All other systems reviewed and are negative.     Allergies  Apple; Penicillins; and Bee venom  Home Medications   Prior to Admission medications   Medication Sig Start Date  End Date Taking? Authorizing Provider  baclofen (LIORESAL) 10 MG tablet Take 10 mg by mouth 3 (three) times daily.  01/06/14   Historical Provider, MD  clonazePAM (KLONOPIN) 1 MG tablet Take 1 tablet (1 mg total) by mouth 3 (three) times daily. 04/28/14   Lysbeth Penner, FNP  clotrimazole (MYCELEX) 10 MG troche Take 1 tablet (10 mg total) by mouth 5 (five) times daily. 07/21/14   Claretta Fraise, MD  divalproex (DEPAKOTE ER) 500 MG 24 hr tablet Take 500 mg by mouth at bedtime.    Historical Provider, MD  doxycycline (VIBRA-TABS) 100 MG tablet Take 1 tablet (100 mg total) by mouth 2 (two) times daily. 12/09/14   Lodema Pilot, PA-C  esomeprazole (NEXIUM) 40 MG capsule Take 1 capsule (40 mg total) by mouth 2 (two) times daily before a meal. 12/14/13   Lysbeth Penner, FNP  FLUoxetine (PROZAC) 20 MG capsule Take 60 mg by mouth daily.  03/29/13   Historical Provider, MD  fluticasone (FLOVENT HFA) 110 MCG/ACT inhaler Inhale 2 puffs into the lungs 2 (two) times daily. 09/27/15   Claretta Fraise, MD  furosemide (LASIX) 40 MG tablet TAKE 1 TABLET BY MOUTH EVERY DAY 10/03/15   Claretta Fraise, MD  gabapentin (NEURONTIN) 300 MG capsule Take 300 mg by mouth 3 (three) times daily.  11/30/14   Historical Provider, MD  gemfibrozil (LOPID) 600 MG tablet Take 1 tablet (600 mg total) by mouth 2 (two) times daily before a meal. 08/11/15   Claretta Fraise, MD  imipramine (TOFRANIL) 50 MG tablet TAKE 2 TABLETS BY MOUTH AT BEDTIME 08/30/15   Claretta Fraise, MD  lamoTRIgine (LAMICTAL) 25 MG tablet  10/05/14   Historical Provider, MD  methocarbamol (ROBAXIN) 500 MG tablet Take 500 mg by mouth 3 (three) times daily.     Historical Provider, MD  mirabegron ER (MYRBETRIQ) 50 MG TB24 tablet Take 1 tablet (50 mg total) by mouth daily. For bladder 08/29/15   Claretta Fraise, MD  Multiple Vitamin (MULTIVITAMIN WITH MINERALS) TABS tablet Take 1 tablet by mouth daily.    Historical Provider, MD  mupirocin ointment (BACTROBAN) 2 % Apply 1 application  topically 2 (two) times daily. 12/01/14   Sharion Balloon, FNP  naloxone [facility]) 1 MG/ML injection USE AS DIRECTED FOR OPIOID EMERGENCY 12/15/14   Historical Provider, MD  naproxen (NAPROSYN) 500 MG tablet TAKE 1 TABLET BY MOUTH TWICE A DAY WITH A MEAL 10/06/15   Claretta Fraise, MD  NEXIUM 40 MG capsule TAKE 1 CAPSULE (40 MG TOTAL) BY MOUTH DAILY. 03/23/15   Chipper Herb, MD  oxcarbazepine (TRILEPTAL) 600 MG tablet Take 600 mg by mouth at bedtime.     Historical Provider, MD  prazosin (MINIPRESS) 5 MG capsule Take 5 mg by mouth at bedtime. 10/05/14   Historical Provider, MD  PROAIR HFA 108 (90 BASE) MCG/ACT inhaler INHALE 2 PUFFS INTO THE LUNGS EVERY 4 (FOUR) HOURS AS NEEDED FOR WHEEZING OR SHORTNESS OF BREATH.  01/28/15   Sharion Balloon, FNP  QVAR 80 MCG/ACT inhaler INHALE 2 PUFFS INTO THE LUNGS 2 (TWO) TIMES DAILY. 09/26/15   Claretta Fraise, MD  tiZANidine (ZANAFLEX) 4 MG tablet Take 4 mg by mouth every 6 (six) hours as needed for muscle spasms.  03/24/14   Historical Provider, MD  topiramate (TOPAMAX) 200 MG tablet Take 200 mg by mouth at bedtime. 03/31/14   Historical Provider, MD  ziprasidone (GEODON) 80 MG capsule Take 160 mg by mouth at bedtime.  03/29/13   Historical Provider, MD   BP 103/58 mmHg  Pulse 91  Temp(Src) 97.6 F (36.4 C) (Oral)  Resp 16  Ht 5\' 2"  (1.575 m)  Wt 74.844 kg  BMI 30.17 kg/m2  SpO2 96% Physical Exam  Constitutional: She is oriented to person, place, and time. She appears well-developed and well-nourished. No distress.  HENT:  Head: Normocephalic and atraumatic.  Mouth/Throat: Mucous membranes are dry.  Eyes: EOM are normal. Pupils are equal, round, and reactive to light. Right eye exhibits no nystagmus. Left eye exhibits no nystagmus.  Neck: Normal range of motion. Neck supple.  Cardiovascular: Normal rate, regular rhythm and intact distal pulses.   Pulmonary/Chest: Effort normal and breath sounds normal. No respiratory distress.  Abdominal: Soft. She exhibits no  distension. There is no tenderness.  Musculoskeletal: Normal range of motion. She exhibits no edema or tenderness.  Neurological: She is alert and oriented to person, place, and time. She has normal strength and normal reflexes. No cranial nerve deficit or sensory deficit. She exhibits normal muscle tone. She displays a negative Romberg sign. Coordination normal.  Skin: Skin is warm and dry. No rash noted.  Psychiatric: She has a normal mood and affect.  Nursing note and vitals reviewed.   ED Course  Procedures (including critical care time) Labs Review Labs Reviewed  CBC WITH DIFFERENTIAL/PLATELET - Abnormal; Notable for the following:    WBC 11.4 (*)    Neutro Abs 8.3 (*)    All other components within normal limits  COMPREHENSIVE METABOLIC PANEL - Abnormal; Notable for the following:    Potassium 3.2 (*)    Creatinine, Ser 1.32 (*)    Calcium 8.7 (*)    Total Protein 5.6 (*)    Albumin 3.2 (*)    ALT 13 (*)    GFR calc non Af Amer 45 (*)    GFR calc Af Amer 52 (*)    All other components within normal limits  VALPROIC ACID LEVEL - Abnormal; Notable for the following:    Valproic Acid Lvl <10 (*)    All other components within normal limits  URINALYSIS, ROUTINE W REFLEX MICROSCOPIC (NOT AT [facility]) - Abnormal; Notable for the following:    APPearance CLOUDY (*)    Hgb urine dipstick TRACE (*)    Leukocytes, UA MODERATE (*)    All other components within normal limits  URINE MICROSCOPIC-ADD ON - Abnormal; Notable for the following:    Squamous Epithelial / LPF 0-5 (*)    Bacteria, UA MANY (*)    All other components within normal limits  I-STAT TROPOININ, ED  I-STAT CG4 LACTIC ACID, ED  I-STAT CG4 LACTIC ACID, ED    Imaging Review Ct Head Wo Contrast  10/15/2015  CLINICAL DATA:  Weakness and dizziness. EXAM: CT HEAD WITHOUT CONTRAST TECHNIQUE: Contiguous axial images were obtained from the base of the skull through the vertex without intravenous contrast. COMPARISON:   01/21/2014 head CT. FINDINGS: No evidence of parenchymal hemorrhage  or extra-axial fluid collection. No mass lesion, mass effect, or midline shift. No CT evidence of acute infarction. Intracranial atherosclerosis. Nonspecific mild stable subcortical and periventricular white matter hypodensity, most in keeping with chronic small vessel ischemic change. Cerebral volume is age appropriate. No ventriculomegaly. The visualized paranasal sinuses are essentially clear. The mastoid air cells are unopacified. No evidence of calvarial fracture. IMPRESSION: 1.  No evidence of acute intracranial abnormality. 2. Mild chronic small vessel ischemia. Electronically Signed   By: Ilona Sorrel M.D.   On: 10/15/2015 20:55   I have personally reviewed and evaluated these images and lab results as part of my medical decision-making.   EKG Interpretation   Date/Time:  Saturday Oct 15 2015 18:53:26 EDT Ventricular Rate:  90 PR Interval:  186 QRS Duration: 75 QT Interval:  432 QTC Calculation: 529 R Axis:   30 Text Interpretation:  Sinus rhythm Low voltage, precordial leads  Borderline T abnormalities, diffuse leads Prolonged QT interval No  significant change since last tracing Confirmed by YAO  MD, DAVID ([id number])  on 10/15/2015 6:58:03 PM      MDM   Final diagnoses:  Dehydration  UTI (lower urinary tract infection)  Hypotension, unspecified hypotension type    On presentation patient has soft blood pressures 103/58, heart rate in the 90s with normal O2 sats on room air. Appears clinically dehydrated on exam. Otherwise no focal neurologic deficits or other abnormalities. EKG does not show acute ischemic changes. I-STAT troponin is undetectable. Patient denies chest pain or shortness of breath. Doubt cardiac causes of patient's symptoms. Given patient's report of dizziness noncontrast CT of the head was obtained does not show any acute intracranial abnormality. Labs consistent with dehydration with elevated  creatinine, mild hypokalemia. Lactic acid is within normal limits. UA is consistent with urinary tract infection. Valproic acid level was ordered as it was unclear whether patient was still taking this medicine and it is undetectable so doubt it is contributing to patient's symptoms.  Patient received 2 L normal saline and potassium replacement with significant improvement in her symptoms. She was treated with fosfomycin by mouth for urinary tract infection as she has a penicillin allergy. Following these treatments patient is ambulating with a steady gait and tolerating by mouth. She is stable for discharge at this time. Her symptoms today were likely orthostasis secondary to dehydration.  Patient was discharged in stable condition. Strict return precautions were discussed and patient reports understanding and agreement with plan. She will follow up with her primary doctor this week for reevaluation of her blood pressure and volume status. Advised to drink plenty of fluids. Patient was discharged without any further acute issues during my care.  This patient was seen and discussed with my attending Dr. Mariea Clonts, MD 10/16/15 [place], MD 10/19/15 [phone]

## 2015-10-15 NOTE — ED Notes (Signed)
Pt. ambulated to bathroom with EMT with no difficulty.

## 2015-10-15 NOTE — ED Notes (Signed)
Ambulated Pt. Approx 171ft. Patient was initially unsteady but improved quickly.   Tolerated well and had no obvious respiratory distress.

## 2015-10-15 NOTE — ED Notes (Signed)
Patient transported to CT SCAN . 

## 2015-10-15 NOTE — ED Notes (Signed)
Pt her via [facility] for weakness/dizziness.  Pt states that for the past several months the has been experiencing dizziness, especially when she moves from sitting to standing, however this am it was markedly worse.  Upon fire arrival bp was 68/40, hr 122 and pt was orthostatic.  CBG 145.  EKG ST.  Pt received 1000 mls ns en-route with improvement.  BP 103/58 upon arrival.

## 2015-10-19 ENCOUNTER — Other Ambulatory Visit: Payer: Self-pay | Admitting: Family Medicine

## 2015-11-03 DIAGNOSIS — N393 Stress incontinence (female) (male): Secondary | ICD-10-CM | POA: Diagnosis not present

## 2015-12-05 DIAGNOSIS — N393 Stress incontinence (female) (male): Secondary | ICD-10-CM | POA: Diagnosis not present

## 2015-12-14 ENCOUNTER — Ambulatory Visit: Payer: Medicare Other | Admitting: Pediatrics

## 2015-12-15 ENCOUNTER — Encounter: Payer: Self-pay | Admitting: Family Medicine

## 2015-12-16 ENCOUNTER — Ambulatory Visit (INDEPENDENT_AMBULATORY_CARE_PROVIDER_SITE_OTHER): Payer: Medicare Other

## 2015-12-16 ENCOUNTER — Ambulatory Visit (INDEPENDENT_AMBULATORY_CARE_PROVIDER_SITE_OTHER): Payer: Medicare Other | Admitting: Family Medicine

## 2015-12-16 ENCOUNTER — Encounter: Payer: Self-pay | Admitting: Family Medicine

## 2015-12-16 VITALS — BP 148/91 | HR 76 | Temp 97.1°F | Ht 62.0 in | Wt 170.2 lb

## 2015-12-16 DIAGNOSIS — H109 Unspecified conjunctivitis: Secondary | ICD-10-CM | POA: Diagnosis not present

## 2015-12-16 DIAGNOSIS — R1011 Right upper quadrant pain: Secondary | ICD-10-CM

## 2015-12-16 DIAGNOSIS — L089 Local infection of the skin and subcutaneous tissue, unspecified: Secondary | ICD-10-CM | POA: Diagnosis not present

## 2015-12-16 LAB — URINALYSIS, COMPLETE
Bilirubin, UA: NEGATIVE
Glucose, UA: NEGATIVE
Ketones, UA: NEGATIVE
Leukocytes, UA: NEGATIVE
Nitrite, UA: NEGATIVE
Protein, UA: NEGATIVE
RBC, UA: NEGATIVE
Specific Gravity, UA: 1.01 (ref 1.005–1.030)
Urobilinogen, Ur: 0.2 mg/dL (ref 0.2–1.0)
pH, UA: 7 (ref 5.0–7.5)

## 2015-12-16 LAB — MICROSCOPIC EXAMINATION: RBC, UA: NONE SEEN /HPF

## 2015-12-16 MED ORDER — TOBRAMYCIN-DEXAMETHASONE 0.3-0.1 % OP SUSP: OPHTHALMIC | Status: DC

## 2015-12-16 MED ORDER — POLYETHYLENE GLYCOL 3350 17 GM/SCOOP PO POWD: 17.0000 g | Freq: Two times a day (BID) | ORAL | Status: DC | PRN

## 2015-12-16 MED ORDER — PSYLLIUM 28 % PO PACK: 1.0000 | PACK | Freq: Two times a day (BID) | ORAL | Status: DC

## 2015-12-16 MED ORDER — MUPIROCIN CALCIUM 2 % EX CREA: TOPICAL_CREAM | CUTANEOUS | Status: DC

## 2015-12-16 NOTE — Progress Notes (Signed)
Subjective:  Patient ID: Darlene Wilcox, female    DOB: Aug 20, 1960  Age: 55 y.o. MRN: [id number]  CC: eye irritation; Mouth Lesions; and umbilical irritation   HPI Darlene Wilcox presents for intermittent RUQ pain for months. Bad attack a few days ago. Lasts 20 min. Now gnawing, sharp.7/10 pain scale. Also has to [facility] express BM at times by pressing on it from the vagina.  Eyes feel sandy, rough for 1 week. White mattering at [employer].   Pus draining intermittently from umbilicus.  History Darlene Wilcox has a past medical history of Anxiety; Seizures ([place]); Headache(784.0); Asthma; GERD (gastroesophageal reflux disease); Full dentures; Bipolar disorder ([place]); Depression; Chronic back pain greater than 3 months duration; Carpal tunnel syndrome of right wrist (12/2012); and History of ankle sprain.   She has past surgical history that includes Cervical fusion; Tubal ligation; Abdominal hysterectomy; Inguinal hernia repair (Right); Bunionectomy (Left); Diagnostic laparoscopy (11/08/2006); Laparoscopic appendectomy (11/08/2006); Lumbar laminectomy (02/05/2007); Carpal tunnel release (Right, 01/13/2013); Appendectomy; and Lumbar fusion (06/28/2014).   Her family history includes Diabetes in her maternal grandmother; Lung cancer in her father.She reports that she has been smoking Cigarettes.  She has a 3.75 pack-year smoking history. She has never used smokeless tobacco. She reports that she does not drink alcohol or use illicit drugs.    ROS Review of Systems  Constitutional: Negative for fever, activity change and appetite change.  HENT: Negative for congestion, rhinorrhea and sore throat.   Eyes: Negative for visual disturbance.  Respiratory: Negative for cough and shortness of breath.   Cardiovascular: Negative for chest pain and palpitations.  Gastrointestinal: Positive for abdominal pain. Negative for nausea, vomiting and diarrhea.  Genitourinary: Negative for dysuria.    Musculoskeletal: Negative for myalgias and arthralgias.    Objective:  BP 148/91 mmHg  Pulse 76  Temp(Src) 97.1 F (36.2 C) (Oral)  Ht 5' 2"  (1.575 m)  Wt 170 lb 3.2 oz (77.202 kg)  BMI 31.12 kg/m2  SpO2 99%  BP Readings from Last 3 Encounters:  12/16/15 148/91  10/16/15 135/57  08/29/15 97/68    Wt Readings from Last 3 Encounters:  12/16/15 170 lb 3.2 oz (77.202 kg)  10/15/15 165 lb (74.844 kg)  08/29/15 163 lb 9.6 oz (74.208 kg)     Physical Exam  Constitutional: She is oriented to person, place, and time. She appears well-developed and well-nourished.  HENT:  Head: Normocephalic and atraumatic.  Eyes: EOM are normal. Pupils are equal, round, and reactive to light. Right eye exhibits discharge (medial epicanthi have mattering).  Neck: Normal range of motion.  Cardiovascular: Normal rate and regular rhythm.   No murmur heard. Pulmonary/Chest: Effort normal and breath sounds normal.  Abdominal: Soft. Bowel sounds are normal. She exhibits no mass. There is tenderness (moderate at RUQ). There is no rebound and no guarding.  Neurological: She is alert and oriented to person, place, and time.  Skin: Skin is warm and dry.  Umbilicus appears to have dried matter. No erythema  Psychiatric: She has a normal mood and affect. Her behavior is normal.     Lab Results  Component Value Date   WBC 11.4* 10/15/2015   HGB 12.0 10/15/2015   HCT 37.5 10/15/2015   PLT 177 10/15/2015   GLUCOSE 91 10/15/2015   CHOL 219* 07/30/2014   TRIG 359* 07/30/2014   HDL 33* 07/30/2014   LDLCALC Comment 11/03/2013   ALT 13* 10/15/2015   AST 16 10/15/2015   NA 143 10/15/2015   K 3.2*  10/15/2015   CL 110 10/15/2015   CREATININE 1.32* 10/15/2015   BUN 10 10/15/2015   CO2 23 10/15/2015   TSH 1.680 06/25/2014   INR 1.0 02/03/2007   HGBA1C 5.2 07/30/2014    Ct Head Wo Contrast  10/15/2015  CLINICAL DATA:  Weakness and dizziness. EXAM: CT HEAD WITHOUT CONTRAST TECHNIQUE: Contiguous axial  images were obtained from the base of the skull through the vertex without intravenous contrast. COMPARISON:  01/21/2014 head CT. FINDINGS: No evidence of parenchymal hemorrhage or extra-axial fluid collection. No mass lesion, mass effect, or midline shift. No CT evidence of acute infarction. Intracranial atherosclerosis. Nonspecific mild stable subcortical and periventricular white matter hypodensity, most in keeping with chronic small vessel ischemic change. Cerebral volume is age appropriate. No ventriculomegaly. The visualized paranasal sinuses are essentially clear. The mastoid air cells are unopacified. No evidence of calvarial fracture. IMPRESSION: 1.  No evidence of acute intracranial abnormality. 2. Mild chronic small vessel ischemia. Electronically Signed   By: Ilona Sorrel M.D.   On: 10/15/2015 20:55    Assessment & Plan:   Darlene Wilcox was seen today for eye irritation, mouth lesions and umbilical irritation.  Diagnoses and all orders for this visit:  Abdominal pain, right upper quadrant -     DG Abd 2 Views; Future -     Amylase -     CBC with Differential/Platelet -     CMP14+EGFR -     Lipase -     Urinalysis, Complete -     US Abdomen Complete; Future  Skin infection  Bilateral conjunctivitis  Other orders -     mupirocin cream (BACTROBAN) 2 %; Apply to affected areatwice daily until healed -     tobramycin-dexamethasone (TOBRADEX) ophthalmic solution; Apply 1 drop in affected eye(s) every 2 hours for two days. Then every 4 hours for 5 days. -     polyethylene glycol powder (GLYCOLAX/MIRALAX) powder; Take 17 g by mouth 2 (two) times daily as needed for moderate constipation. -     psyllium (METAMUCIL SMOOTH TEXTURE) 28 % packet; Take 1 packet by mouth 2 (two) times daily.      I am having Darlene Wilcox start on mupirocin cream, tobramycin-dexamethasone, polyethylene glycol powder, and psyllium. I am also having her maintain her oxcarbazepine, FLUoxetine, ziprasidone,  multivitamin with minerals, baclofen, topiramate, clonazePAM, lamoTRIgine, prazosin, gabapentin, PROAIR HFA, naloxone, gemfibrozil, mirabegron ER, imipramine, QVAR, fluticasone, furosemide, ibuprofen, tetrahydrozoline-zinc, and naproxen.  Meds ordered this encounter  Medications  . mupirocin cream (BACTROBAN) 2 %    Sig: Apply to affected areatwice daily until healed    Dispense:  15 g    Refill:  2  . tobramycin-dexamethasone (TOBRADEX) ophthalmic solution    Sig: Apply 1 drop in affected eye(s) every 2 hours for two days. Then every 4 hours for 5 days.    Dispense:  5 mL    Refill:  0  . polyethylene glycol powder (GLYCOLAX/MIRALAX) powder    Sig: Take 17 g by mouth 2 (two) times daily as needed for moderate constipation.    Dispense:  3350 g    Refill:  1  . psyllium (METAMUCIL SMOOTH TEXTURE) 28 % packet    Sig: Take 1 packet by mouth 2 (two) times daily.    Dispense:  60 packet    Refill:  11   XR - increased stool at Hepatic flexure  Follow-up: No Follow-up on file.  Claretta Fraise, M.D.

## 2015-12-17 LAB — CMP14+EGFR
A/G RATIO: 1.8 (ref 1.2–2.2)
ALT: 20 IU/L (ref 0–32)
AST: 26 IU/L (ref 0–40)
Albumin: 4.1 g/dL (ref 3.5–5.5)
Alkaline Phosphatase: 125 IU/L — ABNORMAL HIGH (ref 39–117)
BUN / CREAT RATIO: 12 (ref 9–23)
BUN: 8 mg/dL (ref 6–24)
Bilirubin Total: 0.2 mg/dL (ref 0.0–1.2)
CALCIUM: 8.6 mg/dL — AB (ref 8.7–10.2)
CO2: 24 mmol/L (ref 18–29)
Chloride: 99 mmol/L (ref 96–106)
Creatinine, Ser: 0.67 mg/dL (ref 0.57–1.00)
GFR, EST AFRICAN AMERICAN: 116 mL/min/{1.73_m2} (ref >59)
GFR, EST NON AFRICAN AMERICAN: 101 mL/min/{1.73_m2} (ref >59)
GLOBULIN, TOTAL: 2.3 g/dL (ref 1.5–4.5)
Glucose: 90 mg/dL (ref 65–99)
POTASSIUM: 3.6 mmol/L (ref 3.5–5.2)
SODIUM: 136 mmol/L (ref 134–144)
TOTAL PROTEIN: 6.4 g/dL (ref 6.0–8.5)

## 2015-12-17 LAB — CBC WITH DIFFERENTIAL/PLATELET
Basophils Absolute: 0.1 10*3/uL (ref 0.0–0.2)
Basos: 1 %
EOS (ABSOLUTE): 0.4 10*3/uL (ref 0.0–0.4)
EOS: 4 %
HEMATOCRIT: 39.6 % (ref 34.0–46.6)
Hemoglobin: 13.3 g/dL (ref 11.1–15.9)
IMMATURE GRANULOCYTES: 0 %
Immature Grans (Abs): 0 10*3/uL (ref 0.0–0.1)
LYMPHS ABS: 2.8 10*3/uL (ref 0.7–3.1)
Lymphs: 24 %
MCH: 28.8 pg (ref 26.6–33.0)
MCHC: 33.6 g/dL (ref 31.5–35.7)
MCV: 86 fL (ref 79–97)
MONOS ABS: 0.9 10*3/uL (ref 0.1–0.9)
Monocytes: 8 %
NEUTROS PCT: 63 %
Neutrophils Absolute: 7.6 10*3/uL — ABNORMAL HIGH (ref 1.4–7.0)
Platelets: 259 10*3/uL (ref 150–379)
RBC: 4.62 x10E6/uL (ref 3.77–5.28)
RDW: 14.5 % (ref 12.3–15.4)
WBC: 11.9 10*3/uL — ABNORMAL HIGH (ref 3.4–10.8)

## 2015-12-17 LAB — LIPASE: Lipase: 178 U/L — ABNORMAL HIGH (ref 0–59)

## 2015-12-17 LAB — AMYLASE: AMYLASE: 45 U/L (ref 31–124)

## 2015-12-19 ENCOUNTER — Telehealth: Payer: Self-pay | Admitting: *Deleted

## 2015-12-19 NOTE — Telephone Encounter (Signed)
-----   Message from Claretta Fraise, MD sent at 12/19/2015 12:40 PM EDT ----- Darlene Wilcox, You have a mild elevation of a pancreas enzyme. If your symptoms persist, it should be checked again to verify. Best Regards, Claretta Fraise, M.D.

## 2015-12-29 ENCOUNTER — Ambulatory Visit
Admission: RE | Admit: 2015-12-29 | Discharge: 2015-12-29 | Disposition: A | Discharge status: Home / Self Care | Payer: Medicare Other | Source: Ambulatory Visit | Attending: Family Medicine | Admitting: Family Medicine

## 2015-12-29 DIAGNOSIS — R1011 Right upper quadrant pain: Secondary | ICD-10-CM

## 2016-01-03 ENCOUNTER — Telehealth: Payer: Self-pay | Admitting: *Deleted

## 2016-01-03 NOTE — Telephone Encounter (Signed)
Pt notified of results Verbalizes understanding 

## 2016-01-03 NOTE — Telephone Encounter (Signed)
-----   Message from Claretta Fraise, MD sent at 12/30/2015  7:53 PM EDT ----- Melvia Heaps, Your ultrasound of your abdomen was normal with exception of some fatty deposits in the liver. This is usually result of being overweight and/or use of alcohol. Weight loss should help. This is very unlikely to be the cause of the pain you have been having Best Regards, Claretta Fraise, M.D.

## 2016-01-16 ENCOUNTER — Other Ambulatory Visit: Payer: Self-pay | Admitting: Family Medicine

## 2016-01-16 NOTE — Telephone Encounter (Signed)
Seen 12/16/15-abd pain - stacks

## 2016-01-18 ENCOUNTER — Telehealth: Payer: Self-pay

## 2016-01-18 ENCOUNTER — Other Ambulatory Visit: Payer: Self-pay | Admitting: Family Medicine

## 2016-01-18 DIAGNOSIS — R1011 Right upper quadrant pain: Secondary | ICD-10-CM

## 2016-01-18 NOTE — Telephone Encounter (Signed)
I recommend referral to GI. (I ordered.)

## 2016-01-18 NOTE — Telephone Encounter (Signed)
Patient aware of [place] results and would like to know what to do next? Patient states she is still in pain and would like to know if she needs to follow up with her OBGYN? Please advise and send back to the pools.

## 2016-01-18 NOTE — Telephone Encounter (Signed)
lmtcb

## 2016-01-19 DIAGNOSIS — N3944 Nocturnal enuresis: Secondary | ICD-10-CM | POA: Diagnosis not present

## 2016-01-26 ENCOUNTER — Other Ambulatory Visit: Payer: Self-pay | Admitting: *Deleted

## 2016-01-26 DIAGNOSIS — H04123 Dry eye syndrome of bilateral lacrimal glands: Secondary | ICD-10-CM | POA: Diagnosis not present

## 2016-01-26 DIAGNOSIS — D3131 Benign neoplasm of right choroid: Secondary | ICD-10-CM | POA: Diagnosis not present

## 2016-01-26 DIAGNOSIS — Z961 Presence of intraocular lens: Secondary | ICD-10-CM | POA: Diagnosis not present

## 2016-01-26 DIAGNOSIS — H26493 Other secondary cataract, bilateral: Secondary | ICD-10-CM | POA: Diagnosis not present

## 2016-02-02 DIAGNOSIS — H26492 Other secondary cataract, left eye: Secondary | ICD-10-CM | POA: Diagnosis not present

## 2016-02-06 NOTE — Congregational Nurse Program (Signed)
Congregational Nurse Program Note  Date of Encounter: 06/29/2015  Past Medical History: Past Medical History:  Diagnosis Date  . Anxiety   . Asthma    daily inhaler  . Bipolar disorder ([place])   . 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 Chronic back pain greater than 3 months duration   . Depression   . Full dentures   . GERD (gastroesophageal reflux disease)   . Headache(784.0)    1-2 x/month  . History of ankle sprain    right; 2 weeks ago;  c/o severe pain  . Seizures ([place])    states "silent seizures"; is on anticonvulsant; none in 2-3 mos.    Encounter Details:   Client was referred to [facility] for vision exam. Tarri Fuller CNP [phone]

## 2016-02-14 ENCOUNTER — Other Ambulatory Visit: Payer: Self-pay | Admitting: Family Medicine

## 2016-02-17 ENCOUNTER — Telehealth: Payer: Self-pay | Admitting: Family Medicine

## 2016-02-17 ENCOUNTER — Ambulatory Visit (INDEPENDENT_AMBULATORY_CARE_PROVIDER_SITE_OTHER): Payer: Medicare Other | Admitting: Family Medicine

## 2016-02-17 ENCOUNTER — Encounter: Payer: Self-pay | Admitting: Family Medicine

## 2016-02-17 VITALS — BP 111/75 | HR 90 | Temp 97.1°F | Ht 62.0 in | Wt 170.5 lb

## 2016-02-17 DIAGNOSIS — N760 Acute vaginitis: Secondary | ICD-10-CM | POA: Diagnosis not present

## 2016-02-17 DIAGNOSIS — N9489 Other specified conditions associated with female genital organs and menstrual cycle: Secondary | ICD-10-CM

## 2016-02-17 DIAGNOSIS — R102 Pelvic and perineal pain: Secondary | ICD-10-CM

## 2016-02-17 DIAGNOSIS — A499 Bacterial infection, unspecified: Secondary | ICD-10-CM

## 2016-02-17 DIAGNOSIS — B9689 Other specified bacterial agents as the cause of diseases classified elsewhere: Secondary | ICD-10-CM

## 2016-02-17 LAB — MICROSCOPIC EXAMINATION

## 2016-02-17 LAB — URINALYSIS, COMPLETE
BILIRUBIN UA: NEGATIVE
GLUCOSE, UA: NEGATIVE
KETONES UA: NEGATIVE
LEUKOCYTES UA: NEGATIVE
NITRITE UA: NEGATIVE
Protein, UA: NEGATIVE
Urobilinogen, Ur: 0.2 mg/dL (ref 0.2–1.0)
pH, UA: 6.5 (ref 5.0–7.5)

## 2016-02-17 MED ORDER — METRONIDAZOLE 500 MG PO TABS: 500.0000 mg | ORAL_TABLET | Freq: Two times a day (BID) | ORAL | 0 refills | Status: DC

## 2016-02-17 NOTE — Progress Notes (Signed)
Subjective:  Patient ID: Darlene Wilcox, female    DOB: [birth year]  Age: 55 y.o. MRN: [id number]  CC: No chief complaint on file.   HPI Darlene Wilcox presents for Several weeks of having pressure sensation in her groin and pelvic area bilaterally. This is markedly uncomfortable when she is walking. It's making it where she has to walk stooped. She denies any dysuria. The pressure is there regardless of urination. She is having urinary incontinence though. She is having to wear a pull-up to bed. This is due to enuresis. She feels that the medication has not helped at all. This is the myrbetriq 50 mg prescribed in April of this year.   History [place] has a past medical history of Anxiety; Asthma; Bipolar disorder ([place]); Carpal tunnel syndrome of right wrist (12/2012); Chronic back pain greater than 3 months duration; Depression; Full dentures; GERD (gastroesophageal reflux disease); Headache(784.0); History of ankle sprain; and Seizures ([place]).   She has a past surgical history that includes Cervical fusion; Tubal ligation; Abdominal hysterectomy; Inguinal hernia repair (Right); Bunionectomy (Left); Diagnostic laparoscopy (11/08/2006); Laparoscopic appendectomy (11/08/2006); Lumbar laminectomy (02/05/2007); Carpal tunnel release (Right, 01/13/2013); Appendectomy; and Lumbar fusion (06/28/2014).   Her family history includes Diabetes in her maternal grandmother; Lung cancer in her father.She reports that she has been smoking Cigarettes.  She has a 3.75 pack-year smoking history. She has never used smokeless tobacco. She reports that she does not drink alcohol or use drugs.    ROS Review of Systems  Constitutional: Negative for activity change, appetite change and fever.  HENT: Negative for congestion, rhinorrhea and sore throat.   Eyes: Negative for visual disturbance.  Respiratory: Negative for cough and shortness of breath.   Cardiovascular: Negative for chest pain and palpitations.    Gastrointestinal: Negative for abdominal pain, diarrhea and nausea.  Genitourinary: Negative for dysuria.  Musculoskeletal: Negative for arthralgias and myalgias.    Objective:  BP 111/75   Pulse 90   Temp 97.1 F (36.2 C) (Oral)   Ht 5\' 2"  (1.575 m)   Wt 170 lb 8 oz (77.3 kg)   BMI 31.18 kg/m   BP Readings from Last 3 Encounters:  02/17/16 111/75  12/16/15 (!) 148/91  10/16/15 135/57    Wt Readings from Last 3 Encounters:  02/17/16 170 lb 8 oz (77.3 kg)  12/16/15 170 lb 3.2 oz (77.2 kg)  10/15/15 165 lb (74.8 kg)     Physical Exam  Constitutional: She is oriented to person, place, and time. She appears well-developed and well-nourished. No distress.  HENT:  Head: Normocephalic and atraumatic.  Eyes: Conjunctivae are normal. Pupils are equal, round, and reactive to light.  Neck: Normal range of motion. Neck supple. No thyromegaly present.  Cardiovascular: Normal rate, regular rhythm and normal heart sounds.   No murmur heard. Pulmonary/Chest: Effort normal and breath sounds normal. No respiratory distress. She has no wheezes. She has no rales.  Abdominal: Soft. Bowel sounds are normal. She exhibits no distension. There is tenderness (suprapubic region).  Musculoskeletal: Normal range of motion.  Lymphadenopathy:    She has no cervical adenopathy.  Neurological: She is alert and oriented to person, place, and time.  Skin: Skin is warm and dry.  Psychiatric: She has a normal mood and affect. Her behavior is normal. Judgment and thought content normal.     Lab Results  Component Value Date   WBC 11.9 (H) 12/16/2015   HGB 12.0 10/15/2015   HCT 39.6 12/16/2015   PLT  259 12/16/2015   GLUCOSE 90 12/16/2015   CHOL 219 (H) 07/30/2014   TRIG 359 (H) 07/30/2014   HDL 33 (L) 07/30/2014   LDLCALC Comment 11/03/2013   ALT 20 12/16/2015   AST 26 12/16/2015   NA 136 12/16/2015   K 3.6 12/16/2015   CL 99 12/16/2015   CREATININE 0.67 12/16/2015   BUN 8 12/16/2015    CO2 24 12/16/2015   TSH 1.680 06/25/2014   INR 1.0 02/03/2007   HGBA1C 5.2 07/30/2014    US Abdomen Complete  Result Date: 12/29/2015 CLINICAL DATA:  Right upper quadrant pain for 2 month EXAM: ABDOMEN ULTRASOUND COMPLETE COMPARISON:  Abdomen films of 12/16/2015 FINDINGS: Gallbladder: The gallbladder is visualized and no gallstones are noted. There is no pain over the gallbladder with compression. Common bile duct: Diameter: The common bile duct is normal measuring 3.5 mm in diameter. Liver: The liver is somewhat echogenic diffusely suggesting fatty infiltration. No focal hepatic abnormality is seen. IVC: No abnormality visualized. Pancreas: The pancreas is moderately well seen with no abnormality noted. Spleen: The spleen measures 8.2 cm. Right Kidney: Length: 12.4 cm.  No hydronephrosis is seen. Left Kidney: Length: 12.3 cm. No hydronephrosis is noted. A probable normal variation of a dromedary hump is noted. Abdominal aorta: The abdominal aorta is normal in caliber. Other findings: None. IMPRESSION: 1. No gallstones.  No ductal dilatation. 2. Echogenic liver parenchyma consistent with fatty infiltration. No focal abnormality. 3. No hydronephrosis. Electronically Signed   By: Ivar Drape M.D.   On: 12/29/2015 10:38    Assessment & Plan:   Diagnoses and all orders for this visit:  Pelvic pressure in female -     Urinalysis, Complete -     Urine culture -     Cancel: HPV DNA Probe (High Risk)  Bacterial vaginosis -     metroNIDAZOLE (FLAGYL) 500 MG tablet; Take 1 tablet (500 mg total) by mouth 2 (two) times daily.   I have discontinued Ms. Medici's naloxone, mirabegron ER, ibuprofen, mupirocin cream, and psyllium. I am also having her start on metroNIDAZOLE. Additionally, I am having her maintain her oxcarbazepine, FLUoxetine, ziprasidone, multivitamin with minerals, baclofen, topiramate, clonazePAM, lamoTRIgine, prazosin, gabapentin, PROAIR HFA, gemfibrozil, imipramine, QVAR, fluticasone,  furosemide, tetrahydrozoline-zinc, tobramycin-dexamethasone, polyethylene glycol powder, naproxen, and esomeprazole.  Meds ordered this encounter  Medications  . metroNIDAZOLE (FLAGYL) 500 MG tablet    Sig: Take 1 tablet (500 mg total) by mouth 2 (two) times daily.    Dispense:  14 tablet    Refill:  0   Urinalysis showed large numbers of clue cells. We will defer her HPV test until she has her Pap smear coming up next month.  Follow-up: Return in about 6 weeks (around 03/30/2016).  Claretta Fraise, M.D.

## 2016-02-18 ENCOUNTER — Other Ambulatory Visit: Payer: Self-pay | Admitting: Family Medicine

## 2016-02-18 MED ORDER — FLUTICASONE PROPIONATE HFA 110 MCG/ACT IN AERO: 2.0000 | INHALATION_SPRAY | Freq: Two times a day (BID) | RESPIRATORY_TRACT | 12 refills | Status: DC

## 2016-02-18 MED ORDER — BUDESONIDE 180 MCG/ACT IN AEPB: 2.0000 | INHALATION_SPRAY | Freq: Two times a day (BID) | RESPIRATORY_TRACT | 11 refills | Status: DC

## 2016-02-18 NOTE — Telephone Encounter (Signed)
I sent in the requested prescription 

## 2016-02-18 NOTE — Telephone Encounter (Signed)
Insurance denied the Pulmicort Flexhaler as well as the Qvar.   They cover : Flovent  Or Arnuity  [facility] [place] - whichever you prefer.

## 2016-02-18 NOTE — Telephone Encounter (Signed)
Pt called - requested that it go to [facility] instead

## 2016-02-20 ENCOUNTER — Other Ambulatory Visit: Payer: Self-pay | Admitting: Family Medicine

## 2016-02-20 LAB — URINE CULTURE

## 2016-02-20 MED ORDER — NITROFURANTOIN MONOHYD MACRO 100 MG PO CAPS: 100.0000 mg | ORAL_CAPSULE | Freq: Two times a day (BID) | ORAL | 0 refills | Status: DC

## 2016-02-20 NOTE — Telephone Encounter (Signed)
Patient aware.

## 2016-02-21 ENCOUNTER — Telehealth: Payer: Self-pay

## 2016-02-21 NOTE — Telephone Encounter (Signed)
I switched her to flovent. Thanks, WS

## 2016-02-22 ENCOUNTER — Other Ambulatory Visit: Payer: Self-pay | Admitting: Family Medicine

## 2016-02-22 NOTE — Telephone Encounter (Signed)
Is patient supposed to still be taking?

## 2016-03-14 ENCOUNTER — Other Ambulatory Visit: Payer: Medicare Other | Admitting: Family Medicine

## 2016-03-14 ENCOUNTER — Telehealth: Payer: Self-pay | Admitting: Family Medicine

## 2016-03-14 NOTE — Telephone Encounter (Signed)
Last office visit was in

## 2016-03-14 NOTE — Telephone Encounter (Signed)
Left message, patient needs to schedule an office visit to have wound accessed.

## 2016-03-14 NOTE — Telephone Encounter (Signed)
Copy of Prescription list mailed to pt on 03/14/2016.

## 2016-03-15 ENCOUNTER — Encounter: Payer: Self-pay | Admitting: Pediatrics

## 2016-03-15 ENCOUNTER — Encounter: Payer: Self-pay | Admitting: Family Medicine

## 2016-03-15 ENCOUNTER — Ambulatory Visit (INDEPENDENT_AMBULATORY_CARE_PROVIDER_SITE_OTHER): Payer: Medicare Other | Admitting: Pediatrics

## 2016-03-15 ENCOUNTER — Ambulatory Visit: Payer: Medicare Other | Admitting: Pediatrics

## 2016-03-15 VITALS — BP 107/73 | HR 111 | Temp 97.8°F | Ht 62.0 in | Wt 167.4 lb

## 2016-03-15 DIAGNOSIS — L0390 Cellulitis, unspecified: Secondary | ICD-10-CM

## 2016-03-15 MED ORDER — DOXYCYCLINE HYCLATE 100 MG PO TABS: 100.0000 mg | ORAL_TABLET | Freq: Two times a day (BID) | ORAL | 0 refills | Status: DC
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
## 2016-03-23 ENCOUNTER — Other Ambulatory Visit: Payer: Medicare Other | Admitting: Family Medicine

## 2016-03-26 ENCOUNTER — Encounter: Payer: Self-pay | Admitting: Family Medicine

## 2016-03-28 ENCOUNTER — Other Ambulatory Visit: Payer: Self-pay | Admitting: Family Medicine

## 2016-04-06 ENCOUNTER — Other Ambulatory Visit: Payer: Self-pay | Admitting: Family Medicine

## 2016-04-24 ENCOUNTER — Encounter: Payer: Self-pay | Admitting: Family Medicine

## 2016-04-24 ENCOUNTER — Ambulatory Visit (INDEPENDENT_AMBULATORY_CARE_PROVIDER_SITE_OTHER): Payer: Medicare Other

## 2016-04-24 ENCOUNTER — Ambulatory Visit (INDEPENDENT_AMBULATORY_CARE_PROVIDER_SITE_OTHER): Payer: Medicare Other | Admitting: Family Medicine

## 2016-04-24 VITALS — BP 118/76 | HR 80 | Temp 97.5°F | Ht 62.0 in | Wt 171.1 lb

## 2016-04-24 DIAGNOSIS — S7000XA Contusion of unspecified hip, initial encounter: Secondary | ICD-10-CM

## 2016-04-24 DIAGNOSIS — S300XXA Contusion of lower back and pelvis, initial encounter: Secondary | ICD-10-CM

## 2016-04-24 DIAGNOSIS — S40022A Contusion of left upper arm, initial encounter: Secondary | ICD-10-CM

## 2016-04-24 DIAGNOSIS — S098XXA Other specified injuries of head, initial encounter: Secondary | ICD-10-CM

## 2016-04-24 DIAGNOSIS — S1093XA Contusion of unspecified part of neck, initial encounter: Secondary | ICD-10-CM

## 2016-04-24 MED ORDER — TRAMADOL HCL 50 MG PO TABS: 100.0000 mg | ORAL_TABLET | Freq: Four times a day (QID) | ORAL | 0 refills | Status: DC | PRN
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
## 2016-04-26 ENCOUNTER — Ambulatory Visit (HOSPITAL_COMMUNITY): Payer: Medicare Other

## 2016-04-30 ENCOUNTER — Ambulatory Visit (HOSPITAL_COMMUNITY)
Admission: RE | Admit: 2016-04-30 | Discharge: 2016-04-30 | Disposition: A | Discharge status: Home / Self Care | Payer: Medicare Other | Source: Ambulatory Visit | Attending: Family Medicine | Admitting: Family Medicine

## 2016-04-30 DIAGNOSIS — S098XXA Other specified injuries of head, initial encounter: Secondary | ICD-10-CM | POA: Insufficient documentation

## 2016-04-30 DIAGNOSIS — W19XXXA Unspecified fall, initial encounter: Secondary | ICD-10-CM | POA: Diagnosis not present

## 2016-04-30 DIAGNOSIS — S0990XA Unspecified injury of head, initial encounter: Secondary | ICD-10-CM | POA: Diagnosis not present

## 2016-05-01 ENCOUNTER — Telehealth: Payer: Self-pay | Admitting: Family Medicine

## 2016-05-01 NOTE — Progress Notes (Signed)
Patient aware.

## 2016-05-01 NOTE — Telephone Encounter (Signed)
Pt notified of results Verbalizes understanding 

## 2016-05-02 ENCOUNTER — Other Ambulatory Visit: Payer: Self-pay | Admitting: Family Medicine

## 2016-05-04 ENCOUNTER — Ambulatory Visit: Payer: Medicare Other | Admitting: Family Medicine

## 2016-05-07 ENCOUNTER — Other Ambulatory Visit: Payer: Self-pay | Admitting: Family Medicine

## 2016-05-07 DIAGNOSIS — S1093XA Contusion of unspecified part of neck, initial encounter: Secondary | ICD-10-CM

## 2016-05-07 DIAGNOSIS — S098XXA Other specified injuries of head, initial encounter: Secondary | ICD-10-CM

## 2016-05-07 DIAGNOSIS — S40022A Contusion of left upper arm, initial encounter: Secondary | ICD-10-CM

## 2016-05-07 DIAGNOSIS — S7000XA Contusion of unspecified hip, initial encounter: Secondary | ICD-10-CM

## 2016-05-07 DIAGNOSIS — S300XXA Contusion of lower back and pelvis, initial encounter: Secondary | ICD-10-CM

## 2016-05-08 ENCOUNTER — Encounter: Payer: Self-pay | Admitting: Family Medicine

## 2016-05-08 ENCOUNTER — Other Ambulatory Visit: Payer: Self-pay | Admitting: Family Medicine

## 2016-05-08 ENCOUNTER — Telehealth: Payer: Self-pay | Admitting: *Deleted

## 2016-05-08 ENCOUNTER — Ambulatory Visit (INDEPENDENT_AMBULATORY_CARE_PROVIDER_SITE_OTHER): Payer: Medicare Other | Admitting: Family Medicine

## 2016-05-08 VITALS — BP 128/68 | HR 105 | Ht 62.0 in | Wt 180.0 lb

## 2016-05-08 DIAGNOSIS — S40022D Contusion of left upper arm, subsequent encounter: Secondary | ICD-10-CM | POA: Diagnosis not present

## 2016-05-08 DIAGNOSIS — S7000XA Contusion of unspecified hip, initial encounter: Secondary | ICD-10-CM | POA: Insufficient documentation

## 2016-05-08 DIAGNOSIS — B9689 Other specified bacterial agents as the cause of diseases classified elsewhere: Secondary | ICD-10-CM | POA: Diagnosis not present

## 2016-05-08 DIAGNOSIS — L739 Follicular disorder, unspecified: Secondary | ICD-10-CM

## 2016-05-08 MED ORDER — TRAMADOL HCL 50 MG PO TABS: 100.0000 mg | ORAL_TABLET | Freq: Four times a day (QID) | ORAL | 0 refills | Status: DC | PRN

## 2016-05-08 MED ORDER — CIPROFLOXACIN HCL 500 MG PO TABS: 500.0000 mg | ORAL_TABLET | Freq: Two times a day (BID) | ORAL | 0 refills | Status: DC

## 2016-05-08 MED ORDER — SULFAMETHOXAZOLE-TRIMETHOPRIM 800-160 MG PO TABS: 1.0000 | ORAL_TABLET | Freq: Two times a day (BID) | ORAL | 0 refills | Status: DC

## 2016-05-08 MED ORDER — BACLOFEN 10 MG PO TABS
10.0000 mg | ORAL_TABLET | Freq: Three times a day (TID) | ORAL | 2 refills | Status: DC
Start: 2016-05-08 — End: 2016-07-19

## 2016-05-08 NOTE — Progress Notes (Signed)
Subjective:  Patient ID: Darlene Wilcox, female    DOB: 10/15/1960  Age: 55 y.o. MRN: [id number]  CC: Arm Pain (pt is here today c/o left arm pain and has a spot under her right breast that has some purulent drainage, she would like refill on pain meds for arm)   HPI Darlene Wilcox presents for continued pain at the left shoulder. Painful to abduct or rotate. Pain is 6-7/10 with motion. It is focused at the axilla at the anterior aspect over the pectoral tendon. She also has a small sore on the right breast that has been braining. Mild soreness. Redness noted over the last 4 days.   History Darlene Wilcox has a past medical history of Anxiety; Asthma; Bipolar disorder ([place]); Carpal tunnel syndrome of right wrist (12/2012); Chronic back pain greater than 3 months duration; Depression; Full dentures; GERD (gastroesophageal reflux disease); Headache(784.0); History of ankle sprain; and Seizures ([place]).   She has a past surgical history that includes Cervical fusion; Tubal ligation; Abdominal hysterectomy; Inguinal hernia repair (Right); Bunionectomy (Left); Diagnostic laparoscopy (11/08/2006); Laparoscopic appendectomy (11/08/2006); Lumbar laminectomy (02/05/2007); Carpal tunnel release (Right, 01/13/2013); Appendectomy; and Lumbar fusion (06/28/2014).   Her family history includes Diabetes in her maternal grandmother; Lung cancer in her father.She reports that she has been smoking Cigarettes.  She has a 3.75 pack-year smoking history. She has never used smokeless tobacco. She reports that she does not drink alcohol or use drugs.    ROS Review of Systems  Constitutional: Negative for fever.  HENT: Negative for congestion, rhinorrhea and sore throat.   Respiratory: Negative for cough and shortness of breath.   Cardiovascular: Negative for chest pain and palpitations.  Gastrointestinal: Negative for abdominal pain.  Musculoskeletal: Positive for arthralgias, back pain and myalgias.    Objective:  BP  128/68   Pulse (!) 105   Ht 5\' 2"  (1.575 m)   Wt 180 lb (81.6 kg)   BMI 32.92 kg/m   BP Readings from Last 3 Encounters:  05/08/16 128/68  04/24/16 118/76  03/15/16 107/73    Wt Readings from Last 3 Encounters:  05/08/16 180 lb (81.6 kg)  04/24/16 171 lb 2 oz (77.6 kg)  03/15/16 167 lb 6.4 oz (75.9 kg)     Physical Exam  Constitutional: She is oriented to person, place, and time.  Musculoskeletal: She exhibits tenderness.  Decreased ROM for abduction - 60 degrees at LUE. Tender for palpation of pectoralis tendon at anterior axilla. No edema.   Neurological: She is alert and oriented to person, place, and time. She displays normal reflexes.  Skin:  Inferior central breast has a 6 mm mildly erythematous lesion that is superficial. There is a pinpoint crust where a head has opened. There is no fluctuence currently.   Psychiatric: Thought content normal. Her affect is blunt. Her speech is delayed. She is slowed. Cognition and memory are normal.     Lab Results  Component Value Date   WBC 11.9 (H) 12/16/2015   HGB 12.0 10/15/2015   HCT 39.6 12/16/2015   PLT 259 12/16/2015   GLUCOSE 90 12/16/2015   CHOL 219 (H) 07/30/2014   TRIG 359 (H) 07/30/2014   HDL 33 (L) 07/30/2014   [place] Comment 11/03/2013   ALT 20 12/16/2015   AST 26 12/16/2015   NA 136 12/16/2015   K 3.6 12/16/2015   CL 99 12/16/2015   CREATININE 0.67 12/16/2015   BUN 8 12/16/2015   CO2 24 12/16/2015   TSH 1.680 06/25/2014  INR 1.0 02/03/2007   HGBA1C 5.2 07/30/2014    Ct Head Wo Contrast  Result Date: 04/30/2016 CLINICAL DATA:  Fall from a balcony on Friday, head injury, initial encounter EXAM: CT HEAD WITHOUT CONTRAST TECHNIQUE: Contiguous axial images were obtained from the base of the skull through the vertex without intravenous contrast. COMPARISON:  01/21/2014 FINDINGS: Brain: No intracranial hemorrhage, mass effect or midline shift. No acute cortical infarction. No mass lesion is noted on this  unenhanced scan. Mild cerebral atrophy. Subtle mild subcortical white matter decreased attenuation probable due to chronic small vessel ischemic changes. Mild periventricular chronic white matter disease. Ventricular size is stable from prior exam. Vascular: No hyperdense vessel or unexpected calcification. Skull: Normal. Negative for fracture or focal lesion. Sinuses/Orbits: No acute finding. Other: None. IMPRESSION: No acute intracranial abnormality. Stable chronic white matter disease. No definite acute cortical infarction. Electronically Signed   By: Lahoma Crocker M.D.   On: 04/30/2016 16:02    Assessment & Plan:   Darlene Wilcox was seen today for arm pain.  Diagnoses and all orders for this visit:  Superficial folliculitis due to bacteria  Contusion of left upper extremity, subsequent encounter -     traMADol (ULTRAM) 50 MG tablet; Take 2 tablets (100 mg total) by mouth 4 (four) times daily as needed for moderate pain. -     Ambulatory referral to Orthopedics -     Ambulatory referral to Physical Therapy  Other orders -     Discontinue: ciprofloxacin (CIPRO) 500 MG tablet; Take 1 tablet (500 mg total) by mouth 2 (two) times daily. Take all of these. -     baclofen (LIORESAL) 10 MG tablet; Take 1 tablet (10 mg total) by mouth 3 (three) times daily.   I have discontinued Darlene Wilcox's naproxen. I have also changed her baclofen. Additionally, I am having her maintain her oxcarbazepine, ziprasidone, multivitamin with minerals, topiramate, clonazePAM, lamoTRIgine, prazosin, gabapentin, PROAIR HFA, gemfibrozil, furosemide, tetrahydrozoline-zinc, polyethylene glycol powder, esomeprazole, fluticasone, MYRBETRIQ, imipramine, FLUoxetine, and traMADol.  Meds ordered this encounter  Medications  . FLUoxetine (PROZAC) 40 MG capsule    Sig: Take 40 mg by mouth daily.  Marland Kitchen DISCONTD: ciprofloxacin (CIPRO) 500 MG tablet    Sig: Take 1 tablet (500 mg total) by mouth 2 (two) times daily. Take all of these.     Dispense:  30 tablet    Refill:  0  . traMADol (ULTRAM) 50 MG tablet    Sig: Take 2 tablets (100 mg total) by mouth 4 (four) times daily as needed for moderate pain.    Dispense:  24 tablet    Refill:  0  . baclofen (LIORESAL) 10 MG tablet    Sig: Take 1 tablet (10 mg total) by mouth 3 (three) times daily.    Dispense:  30 each    Refill:  2     Follow-up: Return if symptoms worsen or fail to improve.  Claretta Fraise, M.D.

## 2016-05-08 NOTE — Telephone Encounter (Signed)
Pharmacist called to inform drug interaction with Geodon and Cipro It causes arrhythmia per pharmacist Please review and advise

## 2016-05-08 NOTE — Telephone Encounter (Signed)
I called in a sulfa antibiotic as a substitute.

## 2016-05-08 NOTE — Telephone Encounter (Signed)
[facility] notified of change in antibiotic Cipro RX cancelled

## 2016-05-09 ENCOUNTER — Encounter: Payer: Self-pay | Admitting: Family Medicine

## 2016-05-11 ENCOUNTER — Other Ambulatory Visit: Payer: Self-pay | Admitting: Pediatrics

## 2016-05-14 ENCOUNTER — Other Ambulatory Visit: Payer: Self-pay | Admitting: Family Medicine

## 2016-06-01 ENCOUNTER — Ambulatory Visit (INDEPENDENT_AMBULATORY_CARE_PROVIDER_SITE_OTHER): Payer: Medicare Other

## 2016-06-01 ENCOUNTER — Encounter: Payer: Self-pay | Admitting: Nurse Practitioner

## 2016-06-01 ENCOUNTER — Ambulatory Visit (INDEPENDENT_AMBULATORY_CARE_PROVIDER_SITE_OTHER): Payer: Medicare Other | Admitting: Nurse Practitioner

## 2016-06-01 VITALS — BP 109/77 | HR 106 | Temp 96.4°F | Ht 62.0 in | Wt 173.0 lb

## 2016-06-01 DIAGNOSIS — R0781 Pleurodynia: Secondary | ICD-10-CM | POA: Diagnosis not present

## 2016-06-01 DIAGNOSIS — S20212A Contusion of left front wall of thorax, initial encounter: Secondary | ICD-10-CM

## 2016-06-01 MED ORDER — HYDROCODONE-ACETAMINOPHEN 5-325 MG PO TABS: 1.0000 | ORAL_TABLET | Freq: Four times a day (QID) | ORAL | 0 refills | Status: DC | PRN

## 2016-06-01 NOTE — Patient Instructions (Signed)
Chest Contusion, Adult  A chest contusion is a deep bruise on the chest. Bruises happen when an injury causes bleeding under the skin. Signs of bruising include pain, puffiness (swelling), and skin that has changed from its normal color. The bruise may turn blue, purple, or yellow. Minor injuries may give you a painless bruise, but worse bruises may stay painful and swollen for a few weeks.  Follow these instructions at home:  · If directed, put ice on the injured area.  ? Put ice in a plastic bag.  ? Place a towel between your skin and the bag.  ? Leave the ice on for 20 minutes, 2-3 times per day.  · Take over-the-counter and prescription medicines only as told by your doctor.  · If told by your doctor, do deep-breathing exercises.  · Do not lie down flat on your back. Keep your head and chest raised (elevated) when you rest or sleep.  · Do not use any products that contain nicotine or tobacco, such as cigarettes and e-cigarettes. If you need help quitting, ask your doctor.  ·  Do not lift anything that causes pain.  Contact a doctor if:  · Medicines or treatment do not help your swelling or pain.  · You have more bruising.  · You have more swelling.  · Your pain gets worse  · Your symptoms do not get better in one week.  Get help right away if:  · You suddenly have a lot more pain.  · You have trouble breathing.  · You feel dizzy or weak.  · You pass out (faint).  · You have blood in your pee (urine) or poop (stool).  · You cough up blood or you throw up (vomit) blood.  Summary  · A chest contusion is a deep bruise on the chest. Bruises happen when an injury causes bleeding under the skin.  · Treatment may include resting and putting ice on the injured area.  · Contact a doctor if you have trouble breathing or if your pain does not get better with treatment.  This information is not intended to replace advice given to you by your health care provider. Make sure you discuss any questions you have with your health  care provider.  Document Released: 10/31/2007 Document Revised: 02/09/2016 Document Reviewed: 02/09/2016  Elsevier Interactive Patient Education © 2017 Elsevier Inc.

## 2016-06-01 NOTE — Progress Notes (Signed)
   Subjective:    Patient ID: Darlene Wilcox, female    DOB: 02-25-1961, 56 y.o.   MRN: [id number]  HPI Patient comes in c/o rib pain from a fall.  Fall occurred 1 week ago.Patient was in [place] and tripped and fell up against a bathtub hitting her left rib cage. Has been hurting every since. Hurts to cough and deep breathe.    Review of Systems  Constitutional: Negative.   Respiratory: Negative.   Cardiovascular: Negative.   Gastrointestinal: Negative.   Genitourinary: Negative.   Musculoskeletal: Negative.   Neurological: Negative.   Psychiatric/Behavioral: Negative.   All other systems reviewed and are negative.      Objective:   Physical Exam  Constitutional: She is oriented to person, place, and time. She appears well-developed and well-nourished. She appears distressed (mild).  Cardiovascular: Normal rate and regular rhythm.   Pulmonary/Chest: Effort normal and breath sounds normal.  Neurological: She is alert and oriented to person, place, and time.  Skin: Skin is warm.  Psychiatric: She has a normal mood and affect. Her behavior is normal. Judgment and thought content normal.   BP 109/77   Pulse (!) 106   Temp (!) 96.4 F (35.8 C) (Oral)   Ht 5\' 2"  (1.575 m)   Wt 173 lb (78.5 kg)   BMI 31.64 kg/m   Chest xray- no rib fracture noted- will see if radiology sees fracture- Preliminary reading by Ronnald Collum, FNP  [facility]      Assessment & Plan:  1. Rib pain on left side  - DG Ribs Unilateral W/Chest Left; Future  2. Contusion of rib on left side, initial encounter Moist heat to area Splint area when coughing or deep breathing Cough and deep breath every 2 hours to help prevent pneumonia - HYDROcodone-acetaminophen (LORTAB) 5-325 MG tablet; Take 1 tablet by mouth every 6 (six) hours as needed for moderate pain.  Dispense: 40 tablet; Refill: 0  Mary-Margaret Hassell Done, FNP

## 2016-06-07 ENCOUNTER — Other Ambulatory Visit: Payer: Self-pay | Admitting: Family

## 2016-06-15 ENCOUNTER — Other Ambulatory Visit: Payer: Self-pay | Admitting: Family Medicine

## 2016-06-26 ENCOUNTER — Other Ambulatory Visit: Payer: Self-pay | Admitting: Family Medicine

## 2016-06-28 ENCOUNTER — Encounter: Payer: Self-pay | Admitting: Nurse Practitioner

## 2016-06-28 ENCOUNTER — Ambulatory Visit (INDEPENDENT_AMBULATORY_CARE_PROVIDER_SITE_OTHER): Payer: Medicare Other | Admitting: Nurse Practitioner

## 2016-06-28 VITALS — BP 112/74 | HR 113 | Temp 96.8°F | Ht 62.0 in | Wt 170.0 lb

## 2016-06-28 DIAGNOSIS — B370 Candidal stomatitis: Secondary | ICD-10-CM

## 2016-06-28 MED ORDER — CLOTRIMAZOLE 10 MG MT TROC: 10.0000 mg | Freq: Every day | OROMUCOSAL | 1 refills | Status: DC

## 2016-06-28 NOTE — Progress Notes (Signed)
   Subjective:    Patient ID: Darlene Wilcox, female    DOB: Nov 17, 1960, 56 y.o.   MRN: [id number]  HPI Patient comes in today stating that tongue feels raw. Has a film on tongue. Has had in the past.    Review of Systems  Constitutional: Negative.   HENT: Negative.   Respiratory: Negative.   Cardiovascular: Negative.   Genitourinary: Negative.   Psychiatric/Behavioral: Negative.        Objective:   Physical Exam  Constitutional: She is oriented to person, place, and time. She appears well-developed and well-nourished.  Neck: Normal range of motion.  Cardiovascular: Normal rate and regular rhythm.   Pulmonary/Chest: Effort normal and breath sounds normal.  Neurological: She is alert and oriented to person, place, and time.  Psychiatric: She has a normal mood and affect. Her behavior is normal. Judgment and thought content normal.   BP 112/74   Pulse (!) 113   Temp (!) 96.8 F (36 C) (Oral)   Ht 5\' 2"  (1.575 m)   Wt 170 lb (77.1 kg)   BMI 31.09 kg/m        Assessment & Plan:  1. Thrush Meds ordered this encounter  Medications  . clotrimazole (MYCELEX) 10 MG troche    Sig: Take 1 tablet (10 mg total) by mouth 5 (five) times daily.    Dispense:  35 tablet    Refill:  1    Order Specific Question:   Supervising Provider    Answer:   Eustaquio Maize [[id number]]   RTO prn  Mary-Margaret Hassell Done, FNP

## 2016-06-28 NOTE — Patient Instructions (Signed)
Oral Thrush, Adult Oral thrush is an infection in your mouth and throat. It causes white patches on your tongue and in your mouth. Follow these instructions at home: Helping with soreness  To lessen your pain: ? Drink cold liquids, like water and iced tea. ? Eat frozen ice pops or frozen juices. ? Eat foods that are easy to swallow, like gelatin and ice cream. ? Drink from a straw if the patches in your mouth are painful. General instructions   Take or use over-the-counter and prescription medicines only as told by your doctor. Medicine for oral thrush may be something to swallow, or it may be something to put on the infected area.  Eat plain yogurt that has live cultures in it. Read the label to make sure.  If you wear dentures: ? Take out your dentures before you go to bed. ? Brush them well. ? Soak them in a denture cleaner.  Rinse your mouth with warm salt-water many times a day. To make the salt-water mixture, completely dissolve 1/2-1 teaspoon of salt in 1 cup of warm water. Contact a doctor if:  Your problems are getting worse.  Your problems do not get better in less than 7 days with treatment.  Your infection is spreading. This may show as white patches on the skin outside of your mouth.  You are nursing your baby and you have redness and pain in the nipples. This information is not intended to replace advice given to you by your health care provider. Make sure you discuss any questions you have with your health care provider. Document Released: 08/08/2009 Document Revised: 02/06/2016 Document Reviewed: 02/06/2016 Elsevier Interactive Patient Education  2017 Elsevier Inc.  

## 2016-07-11 ENCOUNTER — Other Ambulatory Visit: Payer: Self-pay | Admitting: Family Medicine

## 2016-07-13 DIAGNOSIS — N819 Female genital prolapse, unspecified: Secondary | ICD-10-CM | POA: Diagnosis not present

## 2016-07-19 ENCOUNTER — Emergency Department (HOSPITAL_COMMUNITY)
Admission: EM | Admit: 2016-07-19 | Discharge: 2016-07-19 | Disposition: A | Discharge status: Home / Self Care | Payer: Medicare Other | Attending: Emergency Medicine | Admitting: Emergency Medicine

## 2016-07-19 ENCOUNTER — Emergency Department (HOSPITAL_COMMUNITY): Payer: Medicare Other

## 2016-07-19 ENCOUNTER — Encounter (HOSPITAL_COMMUNITY): Payer: Self-pay

## 2016-07-19 DIAGNOSIS — F17210 Nicotine dependence, cigarettes, uncomplicated: Secondary | ICD-10-CM | POA: Insufficient documentation

## 2016-07-19 DIAGNOSIS — R55 Syncope and collapse: Secondary | ICD-10-CM | POA: Diagnosis not present

## 2016-07-19 DIAGNOSIS — Y939 Activity, unspecified: Secondary | ICD-10-CM | POA: Diagnosis not present

## 2016-07-19 DIAGNOSIS — J45909 Unspecified asthma, uncomplicated: Secondary | ICD-10-CM | POA: Diagnosis not present

## 2016-07-19 DIAGNOSIS — Y92009 Unspecified place in unspecified non-institutional (private) residence as the place of occurrence of the external cause: Secondary | ICD-10-CM | POA: Insufficient documentation

## 2016-07-19 DIAGNOSIS — Z5321 Procedure and treatment not carried out due to patient leaving prior to being seen by health care provider: Secondary | ICD-10-CM | POA: Diagnosis not present

## 2016-07-19 DIAGNOSIS — Y999 Unspecified external cause status: Secondary | ICD-10-CM | POA: Diagnosis not present

## 2016-07-19 DIAGNOSIS — R51 Headache: Secondary | ICD-10-CM | POA: Diagnosis not present

## 2016-07-19 LAB — CBC
HEMATOCRIT: 42.1 % (ref 36.0–46.0)
Hemoglobin: 14.2 g/dL (ref 12.0–15.0)
MCH: 28.4 pg (ref 26.0–34.0)
MCHC: 33.7 g/dL (ref 30.0–36.0)
MCV: 84.2 fL (ref 78.0–100.0)
Platelets: 255 10*3/uL (ref 150–400)
RBC: 5 MIL/uL (ref 3.87–5.11)
RDW: 13.6 % (ref 11.5–15.5)
WBC: 14.5 10*3/uL — ABNORMAL HIGH (ref 4.0–10.5)

## 2016-07-19 LAB — BASIC METABOLIC PANEL
Anion gap: 11 (ref 5–15)
BUN: 15 mg/dL (ref 6–20)
CHLORIDE: 105 mmol/L (ref 101–111)
CO2: 24 mmol/L (ref 22–32)
Calcium: 9.6 mg/dL (ref 8.9–10.3)
Creatinine, Ser: 0.97 mg/dL (ref 0.44–1.00)
GFR calc Af Amer: >60 mL/min (ref >60)
GFR calc non Af Amer: >60 mL/min (ref >60)
GLUCOSE: 83 mg/dL (ref 65–99)
POTASSIUM: 3.5 mmol/L (ref 3.5–5.1)
Sodium: 140 mmol/L (ref 135–145)
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
## 2016-07-19 NOTE — ED Notes (Signed)
Pt and family member walked out of room towards lobby.  Did not respond when asked if they were leaving.  Dr. Venora Maples notified.

## 2016-07-19 NOTE — ED Notes (Addendum)
Pt called out to say she is becoming very hostile, this nurse offered security to come if it was getting to that point, pt refused states she needs her meds.  Dr. Venora Maples notified
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
## 2016-07-24 ENCOUNTER — Other Ambulatory Visit: Payer: Self-pay | Admitting: Family Medicine

## 2016-07-25 NOTE — Telephone Encounter (Signed)
Please contact the patient to see if she wants the medication. It looks like this may be an auto refill request by pharmacy for a med no longer in use. Thanks, WS

## 2016-07-25 NOTE — Telephone Encounter (Signed)
Last seen DEC 2017-stacks

## 2016-07-26 DIAGNOSIS — Z720 Tobacco use: Secondary | ICD-10-CM

## 2016-07-26 DIAGNOSIS — J40 Bronchitis, not specified as acute or chronic: Secondary | ICD-10-CM

## 2016-07-26 HISTORY — DX: Tobacco use: Z72.0

## 2016-07-26 HISTORY — DX: Bronchitis, not specified as acute or chronic: J40

## 2016-08-05 ENCOUNTER — Inpatient Hospital Stay (HOSPITAL_COMMUNITY)
Admission: EM | Admit: 2016-08-05 | Discharge: 2016-08-09 | DRG: 286 | Disposition: A | Discharge status: Home / Self Care | Payer: Medicare Other | Attending: Student in an Organized Health Care Education/Training Program | Admitting: Student in an Organized Health Care Education/Training Program

## 2016-08-05 ENCOUNTER — Emergency Department (HOSPITAL_COMMUNITY): Payer: Medicare Other

## 2016-08-05 ENCOUNTER — Encounter (HOSPITAL_COMMUNITY): Payer: Self-pay

## 2016-08-05 DIAGNOSIS — N3941 Urge incontinence: Secondary | ICD-10-CM | POA: Diagnosis not present

## 2016-08-05 DIAGNOSIS — J45901 Unspecified asthma with (acute) exacerbation: Secondary | ICD-10-CM | POA: Diagnosis present

## 2016-08-05 DIAGNOSIS — M549 Dorsalgia, unspecified: Secondary | ICD-10-CM | POA: Diagnosis not present

## 2016-08-05 DIAGNOSIS — R0781 Pleurodynia: Secondary | ICD-10-CM | POA: Diagnosis not present

## 2016-08-05 DIAGNOSIS — F418 Other specified anxiety disorders: Secondary | ICD-10-CM | POA: Diagnosis not present

## 2016-08-05 DIAGNOSIS — Z801 Family history of malignant neoplasm of trachea, bronchus and lung: Secondary | ICD-10-CM | POA: Diagnosis not present

## 2016-08-05 DIAGNOSIS — I340 Nonrheumatic mitral (valve) insufficiency: Secondary | ICD-10-CM | POA: Diagnosis not present

## 2016-08-05 DIAGNOSIS — J45909 Unspecified asthma, uncomplicated: Secondary | ICD-10-CM | POA: Diagnosis present

## 2016-08-05 DIAGNOSIS — K5900 Constipation, unspecified: Secondary | ICD-10-CM | POA: Diagnosis present

## 2016-08-05 DIAGNOSIS — Z91018 Allergy to other foods: Secondary | ICD-10-CM

## 2016-08-05 DIAGNOSIS — J9601 Acute respiratory failure with hypoxia: Secondary | ICD-10-CM | POA: Diagnosis not present

## 2016-08-05 DIAGNOSIS — Z66 Do not resuscitate: Secondary | ICD-10-CM | POA: Diagnosis not present

## 2016-08-05 DIAGNOSIS — R9431 Abnormal electrocardiogram [ECG] [EKG]: Secondary | ICD-10-CM | POA: Insufficient documentation

## 2016-08-05 DIAGNOSIS — E785 Hyperlipidemia, unspecified: Secondary | ICD-10-CM | POA: Diagnosis present

## 2016-08-05 DIAGNOSIS — R569 Unspecified convulsions: Secondary | ICD-10-CM

## 2016-08-05 DIAGNOSIS — R0789 Other chest pain: Secondary | ICD-10-CM | POA: Diagnosis not present

## 2016-08-05 DIAGNOSIS — F17200 Nicotine dependence, unspecified, uncomplicated: Secondary | ICD-10-CM | POA: Diagnosis present

## 2016-08-05 DIAGNOSIS — K219 Gastro-esophageal reflux disease without esophagitis: Secondary | ICD-10-CM | POA: Diagnosis not present

## 2016-08-05 DIAGNOSIS — R079 Chest pain, unspecified: Secondary | ICD-10-CM | POA: Diagnosis not present

## 2016-08-05 DIAGNOSIS — G8929 Other chronic pain: Secondary | ICD-10-CM | POA: Diagnosis not present

## 2016-08-05 DIAGNOSIS — Z981 Arthrodesis status: Secondary | ICD-10-CM | POA: Diagnosis not present

## 2016-08-05 DIAGNOSIS — F17211 Nicotine dependence, cigarettes, in remission: Secondary | ICD-10-CM | POA: Diagnosis not present

## 2016-08-05 DIAGNOSIS — Z880 Allergy status to penicillin: Secondary | ICD-10-CM | POA: Diagnosis not present

## 2016-08-05 DIAGNOSIS — I809 Phlebitis and thrombophlebitis of unspecified site: Secondary | ICD-10-CM | POA: Diagnosis not present

## 2016-08-05 DIAGNOSIS — Z833 Family history of diabetes mellitus: Secondary | ICD-10-CM | POA: Diagnosis not present

## 2016-08-05 DIAGNOSIS — F319 Bipolar disorder, unspecified: Secondary | ICD-10-CM | POA: Diagnosis present

## 2016-08-05 DIAGNOSIS — I509 Heart failure, unspecified: Secondary | ICD-10-CM

## 2016-08-05 DIAGNOSIS — I5021 Acute systolic (congestive) heart failure: Secondary | ICD-10-CM | POA: Diagnosis not present

## 2016-08-05 DIAGNOSIS — Z90710 Acquired absence of both cervix and uterus: Secondary | ICD-10-CM

## 2016-08-05 DIAGNOSIS — F3181 Bipolar II disorder: Secondary | ICD-10-CM | POA: Diagnosis present

## 2016-08-05 DIAGNOSIS — I4581 Long QT syndrome: Secondary | ICD-10-CM | POA: Diagnosis not present

## 2016-08-05 DIAGNOSIS — G40909 Epilepsy, unspecified, not intractable, without status epilepticus: Secondary | ICD-10-CM | POA: Diagnosis not present

## 2016-08-05 DIAGNOSIS — Z91030 Bee allergy status: Secondary | ICD-10-CM

## 2016-08-05 DIAGNOSIS — Z7951 Long term (current) use of inhaled steroids: Secondary | ICD-10-CM | POA: Diagnosis not present

## 2016-08-05 DIAGNOSIS — R0602 Shortness of breath: Secondary | ICD-10-CM | POA: Diagnosis not present

## 2016-08-05 DIAGNOSIS — E8770 Fluid overload, unspecified: Secondary | ICD-10-CM | POA: Diagnosis present

## 2016-08-05 DIAGNOSIS — F17210 Nicotine dependence, cigarettes, uncomplicated: Secondary | ICD-10-CM | POA: Diagnosis present

## 2016-08-05 DIAGNOSIS — F419 Anxiety disorder, unspecified: Secondary | ICD-10-CM | POA: Diagnosis present

## 2016-08-05 DIAGNOSIS — R0902 Hypoxemia: Secondary | ICD-10-CM | POA: Diagnosis not present

## 2016-08-05 DIAGNOSIS — I808 Phlebitis and thrombophlebitis of other sites: Secondary | ICD-10-CM | POA: Diagnosis not present

## 2016-08-05 LAB — BASIC METABOLIC PANEL
ANION GAP: 11 (ref 5–15)
BUN: 8 mg/dL (ref 6–20)
CALCIUM: 9.1 mg/dL (ref 8.9–10.3)
CHLORIDE: 103 mmol/L (ref 101–111)
CO2: 26 mmol/L (ref 22–32)
Creatinine, Ser: 0.78 mg/dL (ref 0.44–1.00)
GFR calc non Af Amer: >60 mL/min (ref >60)
Glucose, Bld: 100 mg/dL — ABNORMAL HIGH (ref 65–99)
POTASSIUM: 3.1 mmol/L — AB (ref 3.5–5.1)
Sodium: 140 mmol/L (ref 135–145)

## 2016-08-05 LAB — CBC WITH DIFFERENTIAL/PLATELET
BASOS ABS: 0 10*3/uL (ref 0.0–0.1)
Basophils Relative: 0 %
Eosinophils Absolute: 0.4 10*3/uL (ref 0.0–0.7)
Eosinophils Relative: 4 %
HEMATOCRIT: 37 % (ref 36.0–46.0)
HEMOGLOBIN: 12.1 g/dL (ref 12.0–15.0)
LYMPHS PCT: 20 %
Lymphs Abs: 1.9 10*3/uL (ref 0.7–4.0)
MCH: 27.5 pg (ref 26.0–34.0)
MCHC: 32.7 g/dL (ref 30.0–36.0)
MCV: 84.1 fL (ref 78.0–100.0)
Monocytes Absolute: 0.5 10*3/uL (ref 0.1–1.0)
Monocytes Relative: 5 %
NEUTROS PCT: 71 %
Neutro Abs: 7 10*3/uL (ref 1.7–7.7)
Platelets: 280 10*3/uL (ref 150–400)
RBC: 4.4 MIL/uL (ref 3.87–5.11)
RDW: 13.7 % (ref 11.5–15.5)
WBC: 9.8 10*3/uL (ref 4.0–10.5)

## 2016-08-05 LAB — TROPONIN I
Troponin I: 0.03 ng/mL (ref <0.03)
Troponin I: <0.03 ng/mL (ref <0.03)

## 2016-08-05 LAB — I-STAT TROPONIN, ED: Troponin i, poc: 0 ng/mL (ref 0.00–0.08)

## 2016-08-05 LAB — MAGNESIUM: Magnesium: 1.7 mg/dL (ref 1.7–2.4)

## 2016-08-05 LAB — BRAIN NATRIURETIC PEPTIDE: B NATRIURETIC PEPTIDE 5: 46 pg/mL (ref 0.0–100.0)

## 2016-08-05 MED ORDER — ALBUTEROL SULFATE (2.5 MG/3ML) 0.083% IN NEBU: 2.5000 mg | INHALATION_SOLUTION | RESPIRATORY_TRACT | Status: DC | PRN

## 2016-08-05 MED ORDER — ORAL CARE MOUTH RINSE
15.0000 mL | Freq: Two times a day (BID) | OROMUCOSAL | Status: DC
  Administered 2016-08-05 – 2016-08-08 (×6): 15 mL via OROMUCOSAL

## 2016-08-05 MED ORDER — FLUOXETINE HCL 20 MG PO CAPS
40.0000 mg | ORAL_CAPSULE | Freq: Every day | ORAL | Status: DC
  Administered 2016-08-05 – 2016-08-09 (×5): 40 mg via ORAL
  Filled 2016-08-05 (×5): qty 2

## 2016-08-05 MED ORDER — MIRABEGRON ER 50 MG PO TB24
50.0000 mg | ORAL_TABLET | Freq: Every day | ORAL | Status: DC
  Administered 2016-08-06 – 2016-08-09 (×4): 50 mg via ORAL
  Filled 2016-08-05 (×4): qty 1

## 2016-08-05 MED ORDER — ENOXAPARIN SODIUM 40 MG/0.4ML ~~LOC~~ SOLN
40.0000 mg | SUBCUTANEOUS | Status: DC
  Administered 2016-08-05 – 2016-08-07 (×3): 40 mg via SUBCUTANEOUS
  Filled 2016-08-05 (×3): qty 0.4

## 2016-08-05 MED ORDER — ZIPRASIDONE HCL 80 MG PO CAPS
160.0000 mg | ORAL_CAPSULE | Freq: Three times a day (TID) | ORAL | Status: DC
  Filled 2016-08-05: qty 2

## 2016-08-05 MED ORDER — PRAZOSIN HCL 5 MG PO CAPS
5.0000 mg | ORAL_CAPSULE | Freq: Every day | ORAL | Status: DC
  Administered 2016-08-05 – 2016-08-08 (×4): 5 mg via ORAL
  Filled 2016-08-05 (×5): qty 1

## 2016-08-05 MED ORDER — GEMFIBROZIL 600 MG PO TABS
600.0000 mg | ORAL_TABLET | Freq: Two times a day (BID) | ORAL | Status: DC
  Administered 2016-08-06 – 2016-08-09 (×5): 600 mg via ORAL
  Filled 2016-08-05 (×9): qty 1

## 2016-08-05 MED ORDER — OXCARBAZEPINE 300 MG PO TABS
600.0000 mg | ORAL_TABLET | Freq: Every day | ORAL | Status: DC
  Administered 2016-08-05 – 2016-08-08 (×4): 600 mg via ORAL
  Filled 2016-08-05 (×5): qty 2

## 2016-08-05 MED ORDER — IPRATROPIUM-ALBUTEROL 0.5-2.5 (3) MG/3ML IN SOLN
3.0000 mL | Freq: Once | RESPIRATORY_TRACT | Status: AC
  Administered 2016-08-05: 3 mL via RESPIRATORY_TRACT
  Filled 2016-08-05: qty 3

## 2016-08-05 MED ORDER — BUDESONIDE 0.5 MG/2ML IN SUSP
0.5000 mg | Freq: Two times a day (BID) | RESPIRATORY_TRACT | Status: DC
  Administered 2016-08-05 – 2016-08-09 (×8): 0.5 mg via RESPIRATORY_TRACT
  Filled 2016-08-05 (×9): qty 2

## 2016-08-05 MED ORDER — ADULT MULTIVITAMIN W/MINERALS CH
1.0000 | ORAL_TABLET | Freq: Every day | ORAL | Status: DC
  Administered 2016-08-06 – 2016-08-09 (×4): 1 via ORAL
  Filled 2016-08-05 (×4): qty 1

## 2016-08-05 MED ORDER — ALBUTEROL SULFATE (2.5 MG/3ML) 0.083% IN NEBU: 5.0000 mg | INHALATION_SOLUTION | Freq: Once | RESPIRATORY_TRACT | Status: DC

## 2016-08-05 MED ORDER — TOPIRAMATE 100 MG PO TABS
200.0000 mg | ORAL_TABLET | Freq: Every day | ORAL | Status: DC
  Administered 2016-08-05 – 2016-08-08 (×4): 200 mg via ORAL
  Filled 2016-08-05 (×4): qty 2

## 2016-08-05 MED ORDER — ACETAMINOPHEN 650 MG RE SUPP: 650.0000 mg | Freq: Four times a day (QID) | RECTAL | Status: DC | PRN

## 2016-08-05 MED ORDER — POTASSIUM CHLORIDE ER 10 MEQ PO TBCR: 40.0000 meq | EXTENDED_RELEASE_TABLET | Freq: Once | ORAL | 0 refills | Status: DC

## 2016-08-05 MED ORDER — PANTOPRAZOLE SODIUM 40 MG PO TBEC
40.0000 mg | DELAYED_RELEASE_TABLET | Freq: Every day | ORAL | Status: DC
  Administered 2016-08-06 – 2016-08-09 (×4): 40 mg via ORAL
  Filled 2016-08-05 (×4): qty 1

## 2016-08-05 MED ORDER — CLONAZEPAM 0.5 MG PO TABS
1.0000 mg | ORAL_TABLET | Freq: Three times a day (TID) | ORAL | Status: DC
  Administered 2016-08-05 – 2016-08-09 (×11): 1 mg via ORAL
  Filled 2016-08-05 (×11): qty 2

## 2016-08-05 MED ORDER — FUROSEMIDE 10 MG/ML IJ SOLN
40.0000 mg | Freq: Once | INTRAMUSCULAR | Status: AC
  Administered 2016-08-05: 40 mg via INTRAVENOUS
  Filled 2016-08-05: qty 4

## 2016-08-05 MED ORDER — ACETAMINOPHEN 325 MG PO TABS
650.0000 mg | ORAL_TABLET | Freq: Four times a day (QID) | ORAL | Status: DC | PRN
  Administered 2016-08-08: 650 mg via ORAL
  Filled 2016-08-05: qty 2

## 2016-08-05 MED ORDER — IMIPRAMINE HCL 50 MG PO TABS
100.0000 mg | ORAL_TABLET | Freq: Every day | ORAL | Status: DC
  Administered 2016-08-05 – 2016-08-08 (×4): 100 mg via ORAL
  Filled 2016-08-05 (×5): qty 2

## 2016-08-05 MED ORDER — CLONAZEPAM 0.5 MG PO TABS: 1.0000 mg | ORAL_TABLET | Freq: Three times a day (TID) | ORAL | Status: DC

## 2016-08-05 MED ORDER — LAMOTRIGINE 25 MG PO TABS
25.0000 mg | ORAL_TABLET | Freq: Two times a day (BID) | ORAL | Status: DC
Start: 2016-08-05 — End: 2016-08-09
  Administered 2016-08-05 – 2016-08-09 (×8): 25 mg via ORAL
  Filled 2016-08-05 (×8): qty 1

## 2016-08-05 MED ORDER — POTASSIUM CHLORIDE CRYS ER 20 MEQ PO TBCR: 40.0000 meq | EXTENDED_RELEASE_TABLET | Freq: Once | ORAL | 0 refills | Status: DC

## 2016-08-05 MED ORDER — GABAPENTIN 300 MG PO CAPS: 300.0000 mg | ORAL_CAPSULE | Freq: Three times a day (TID) | ORAL | Status: DC

## 2016-08-05 MED ORDER — DICLOFENAC SODIUM 1 % TD GEL
2.0000 g | Freq: Four times a day (QID) | TRANSDERMAL | Status: DC
  Administered 2016-08-05 – 2016-08-09 (×11): 2 g via TOPICAL
  Filled 2016-08-05 (×2): qty 100

## 2016-08-05 MED ORDER — FLUTICASONE PROPIONATE HFA 110 MCG/ACT IN AERO: 2.0000 | INHALATION_SPRAY | Freq: Two times a day (BID) | RESPIRATORY_TRACT | Status: DC

## 2016-08-05 MED ORDER — POLYETHYLENE GLYCOL 3350 17 GM/SCOOP PO POWD: 17.0000 g | Freq: Two times a day (BID) | ORAL | Status: DC | PRN

## 2016-08-05 MED ORDER — GABAPENTIN 300 MG PO CAPS
300.0000 mg | ORAL_CAPSULE | Freq: Three times a day (TID) | ORAL | Status: DC
  Administered 2016-08-05 – 2016-08-09 (×11): 300 mg via ORAL
  Filled 2016-08-05 (×11): qty 1

## 2016-08-05 MED ORDER — POTASSIUM CHLORIDE CRYS ER 20 MEQ PO TBCR
40.0000 meq | EXTENDED_RELEASE_TABLET | Freq: Two times a day (BID) | ORAL | Status: DC
  Administered 2016-08-05 – 2016-08-09 (×9): 40 meq via ORAL
  Filled 2016-08-05 (×9): qty 2

## 2016-08-05 MED ORDER — ZIPRASIDONE HCL 80 MG PO CAPS
160.0000 mg | ORAL_CAPSULE | Freq: Three times a day (TID) | ORAL | Status: DC
  Administered 2016-08-05 – 2016-08-09 (×10): 160 mg via ORAL
  Filled 2016-08-05 (×14): qty 2

## 2016-08-05 MED ORDER — NICOTINE 21 MG/24HR TD PT24
21.0000 mg | MEDICATED_PATCH | Freq: Every day | TRANSDERMAL | Status: DC
  Administered 2016-08-05 – 2016-08-09 (×5): 21 mg via TRANSDERMAL
  Filled 2016-08-05 (×5): qty 1

## 2016-08-05 MED ORDER — FLUOXETINE HCL 20 MG PO CAPS: 40.0000 mg | ORAL_CAPSULE | Freq: Every day | ORAL | Status: DC

## 2016-08-05 NOTE — ED Notes (Signed)
Pt given lunch tray.

## 2016-08-05 NOTE — ED Provider Notes (Signed)
[facility] DEPT Provider Note   CSN: [id number] Arrival date & time: 08/05/16  6256     History   Chief Complaint Chief Complaint  Patient presents with  . Chest Pain  . Cough    HPI Darlene Wilcox is a 56 y.o. female presenting with months of chronic coughing and a sudden onset of chest pain when coughing 5 days ago which is worse with deep inhalation, better at rest.She has taken her albuterol treatment yesterday with modest relief. She reports shortness of breath on exertion that has been worsening every day. She denies estrogen use, history of DVT/PE, malignancy, recent immobilization or surgery, hemoptysis, oxygen requirement at home, nausea, vomiting.   HPI  Past Medical History:  Diagnosis Date  . Anxiety   . Asthma    daily inhaler  . Bipolar disorder ([place])   . 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 Chronic back pain greater than 3 months duration   . Depression   . Full dentures   . GERD (gastroesophageal reflux disease)   . Headache(784.0)    1-2 x/month  . History of ankle sprain    right; 2 weeks ago;  c/o severe pain  . Seizures ([place])    states "silent seizures"; is on anticonvulsant; none in 2-3 mos.    Patient Active Problem List   Diagnosis Date Noted  . Volume overload 08/05/2016  . Contusion of hip 05/08/2016  . Asthma, chronic 08/07/2014  . Seizures ([place]) 02/23/2014  . MANIC DEPRESSIVE ILLNESS 11/13/2006  . DISORDER, TOBACCO USE 11/13/2006  . GERD 11/13/2006  . BACK PAIN, CHRONIC 11/13/2006  . HX, PERSONAL, ALCOHOLISM 11/13/2006    Past Surgical History:  Procedure Laterality Date  . ABDOMINAL HYSTERECTOMY     partial  . APPENDECTOMY    . BUNIONECTOMY Left   . CARPAL TUNNEL RELEASE Right 01/13/2013   Procedure: CARPAL TUNNEL RELEASE;  Surgeon: Cammie Sickle., MD;  Location: [facility];  Service: Orthopedics;  Laterality: Right;  . CERVICAL FUSION     x 2  . DIAGNOSTIC LAPAROSCOPY  11/08/2006  .  INGUINAL HERNIA REPAIR Right   . LAPAROSCOPIC APPENDECTOMY  11/08/2006  . LUMBAR FUSION  06/28/2014   L2-5  . LUMBAR LAMINECTOMY  02/05/2007   L4-5 fusion  . TUBAL LIGATION      OB History    No data available       Home Medications    Prior to Admission medications   Medication Sig Start Date End Date Taking? Authorizing Provider  clonazePAM (KLONOPIN) 1 MG tablet Take 1 tablet (1 mg total) by mouth 3 (three) times daily. 04/28/14  Yes Lysbeth Penner, FNP  esomeprazole (NEXIUM) 40 MG capsule TAKE ONE CAPSULE BY MOUTH EVERY DAY 07/11/16  Yes Claretta Fraise, MD  FLUoxetine (PROZAC) 40 MG capsule Take 40 mg by mouth daily. 05/01/16  Yes Historical Provider, MD  fluticasone (FLOVENT HFA) 110 MCG/ACT inhaler Inhale 2 puffs into the lungs 2 (two) times daily. 02/18/16  Yes Claretta Fraise, MD  furosemide (LASIX) 40 MG tablet TAKE 1 TABLET BY MOUTH EVERY DAY Patient taking differently: TAKE 1 TABLET BY MOUTH daily as needed for fluid 10/03/15  Yes Claretta Fraise, MD  gabapentin (NEURONTIN) 300 MG capsule Take 300 mg by mouth 3 (three) times daily.  11/30/14  Yes Historical Provider, MD  gemfibrozil (LOPID) 600 MG tablet Take 1 tablet (600 mg total) by mouth 2 (two) times daily before a meal. 08/11/15  Yes  Claretta Fraise, MD  imipramine (TOFRANIL) 50 MG tablet TAKE 2 TABLETS BY MOUTH AT BEDTIME 05/02/16  Yes Claretta Fraise, MD  lamoTRIgine (LAMICTAL) 25 MG tablet Take 25 mg by mouth 2 (two) times daily.  10/05/14  Yes Historical Provider, MD  Multiple Vitamin (MULTIVITAMIN WITH MINERALS) TABS tablet Take 1 tablet by mouth daily.   Yes Historical Provider, MD  MYRBETRIQ 50 MG TB24 tablet TAKE 1 TABLET (50 MG TOTAL) BY MOUTH DAILY. FOR BLADDER 02/22/16  Yes Claretta Fraise, MD  naproxen (NAPROSYN) 500 MG tablet TAKE 1 TABLET BY MOUTH TWICE A DAY WITH A MEAL 06/18/16  Yes Claretta Fraise, MD  oxcarbazepine (TRILEPTAL) 600 MG tablet Take 600 mg by mouth at bedtime.    Yes Historical Provider, MD  polyethylene glycol  powder (GLYCOLAX/MIRALAX) powder Take 17 g by mouth 2 (two) times daily as needed for moderate constipation. Patient taking differently: Take 17 g by mouth every morning.  12/16/15  Yes Claretta Fraise, MD  prazosin (MINIPRESS) 5 MG capsule Take 5 mg by mouth at bedtime. 10/05/14  Yes Historical Provider, MD  PROAIR HFA 108 (90 BASE) MCG/ACT inhaler INHALE 2 PUFFS INTO THE LUNGS EVERY 4 (FOUR) HOURS AS NEEDED FOR WHEEZING OR SHORTNESS OF BREATH. 01/28/15  Yes Sharion Balloon, FNP  topiramate (TOPAMAX) 200 MG tablet Take 200 mg by mouth at bedtime. 03/31/14  Yes Historical Provider, MD  ziprasidone (GEODON) 80 MG capsule Take 160 mg by mouth 3 (three) times daily.  03/29/13  Yes Historical Provider, MD  potassium chloride (K-DUR) 10 MEQ tablet Take 4 tablets (40 mEq total) by mouth once. 08/05/16 08/05/16  Emeline General, PA-C  potassium chloride SA (K-DUR,KLOR-CON) 20 MEQ tablet Take 2 tablets (40 mEq total) by mouth once. 08/05/16 08/05/16  Emeline General, PA-C  potassium chloride SA (K-DUR,KLOR-CON) 20 MEQ tablet Take 2 tablets (40 mEq total) by mouth once. 08/05/16 08/05/16  Emeline General, PA-C    Family History Family History  Problem Relation Age of Onset  . Lung cancer Father   . Diabetes Maternal Grandmother     Social History Social History  Substance Use Topics  . Smoking status: Current Some Day Smoker    Packs/day: 0.25    Years: 15.00    Types: Cigarettes  . Smokeless tobacco: Never Used     Comment: pt trying to quit (04/12/2014)  . Alcohol use No     Allergies   Apple; Penicillins; and Bee venom   Review of Systems Review of Systems  Constitutional: Negative for appetite change, chills, diaphoresis and fever.  HENT: Negative for congestion, ear pain and sore throat.   Eyes: Negative for pain and visual disturbance.  Respiratory: Positive for cough and shortness of breath. Negative for chest tightness and wheezing.        Worsening sob on exertion    Cardiovascular: Positive for chest pain. Negative for palpitations and leg swelling.       Intermittent over the past 5 days with coughing or deep inhalation  Gastrointestinal: Negative for abdominal distention, abdominal pain, blood in stool, nausea and vomiting.  Genitourinary: Negative for difficulty urinating, dysuria, flank pain and hematuria.  Musculoskeletal: Negative for arthralgias, back pain, myalgias, neck pain and neck stiffness.  Skin: Negative for color change, pallor and rash.  Neurological: Negative for seizures and syncope.  All other systems reviewed and are negative.    Physical Exam Updated Vital Signs BP 114/68   Pulse 95   Temp 97.8 F (36.6  C) (Oral)   Resp 20   Ht 5\' 2"  (1.575 m)   Wt 79.4 kg   SpO2 94%   BMI 32.01 kg/m   Physical Exam  Constitutional: She appears well-developed and well-nourished. No distress.  Patient is afebrile, nontoxic-appearing, speaking in full sentences no acute distress.  HENT:  Head: Normocephalic and atraumatic.  Eyes: Conjunctivae and EOM are normal.  Neck: Normal range of motion. Neck supple.  Cardiovascular: Normal rate, regular rhythm, normal heart sounds and intact distal pulses.   No murmur heard. Pulmonary/Chest: Effort normal. No respiratory distress. She has wheezes. She has rales.  Mild wheezing anteriorly  Abdominal: Soft. She exhibits no distension. There is no tenderness. There is no guarding.  Musculoskeletal: She exhibits no edema.  Neurological: She is alert.  Skin: Skin is warm and dry. She is not diaphoretic.  Psychiatric: She has a normal mood and affect.  Nursing note and vitals reviewed.    ED Treatments / Results  Labs (all labs ordered are listed, but only abnormal results are displayed) Labs Reviewed  BASIC METABOLIC PANEL - Abnormal; Notable for the following:       Result Value   Potassium 3.1 (*)    Glucose, Bld 100 (*)    All other components within normal limits  CBC WITH  DIFFERENTIAL/PLATELET  BRAIN NATRIURETIC PEPTIDE  I-STAT TROPOININ, ED    EKG  EKG Interpretation  Date/Time:  Sunday August 05 2016 08:29:54 EDT Ventricular Rate:  104 PR Interval:    QRS Duration: 88 QT Interval:  514 QTC Calculation: 675 R Axis:   9 Text Interpretation:  Critical Test Result: Long QTc Accelerated Junctional rhythm Prolonged QT Abnormal ECG Confirmed by Rogene Houston  MD, SCOTT ([id number]) on 08/05/2016 10:38:02 AM       Radiology Dg Chest 2 View  Result Date: 08/05/2016 CLINICAL DATA:  Shortness of breath and chest pain for 5 days EXAM: CHEST  2 VIEW COMPARISON:  06/01/2016 FINDINGS: Cardiac shadow is mildly enlarged. Vascular congestion and mild interstitial edema is noted consistent with CHF. No sizable effusion is noted. No focal infiltrate is seen. IMPRESSION: Changes of mild CHF. Electronically Signed   By: Inez Catalina M.D.   On: 08/05/2016 09:22    Procedures Procedures (including critical care time)  Medications Ordered in ED Medications  albuterol (PROVENTIL) (2.5 MG/3ML) 0.083% nebulizer solution 5 mg (0 mg Nebulization Hold 08/05/16 0853)  potassium chloride SA (K-DUR,KLOR-CON) CR tablet 40 mEq (40 mEq Oral Given 08/05/16 1119)  ipratropium-albuterol (DUONEB) 0.5-2.5 (3) MG/3ML nebulizer solution 3 mL (3 mLs Nebulization Given 08/05/16 0928)  furosemide (LASIX) injection 40 mg (40 mg Intravenous Given 08/05/16 1058)     Initial Impression / Assessment and Plan / ED Course  I have reviewed the triage vital signs and the nursing notes.  Pertinent labs & imaging results that were available during my care of the patient were reviewed by me and considered in my medical decision making (see chart for details).     patient presents with intermittent c/p and worsening sob on exertion. Crackles diffusely on exam. CXR with evidence of CHF Patient saturation below 90% while not on O2. New oxygen requirement for patient. She was placed on 2l/min while in Ed. When  removing o2, she immediately desats under 90% and becomes short of breath.. reassessed after duonebs and no longer wheezing, but diffuse crackles on exam. Patient reported improvement while on o2 and didn't have sob at rest.  Patient given lasix and potassium while in  ED. Rock Nephew criteria: 0  Patient was discussed with Dr. Rogene Houston who also has seen patient and agrees with assessment and plan. Patient will need to be admitted for further tx/evaluation and possible ECHO. Discussed with patient and she is agreeable to plan.  Spoke to admitting hospitalist who will be coming to evaluate patient for admission.   Final Clinical Impressions(s) / ED Diagnoses   Final diagnoses:  Atypical chest pain  Acute congestive heart failure, unspecified congestive heart failure type [facility])  Shortness of breath    New Prescriptions    Emeline General, PA-C 08/05/16 1147    Fredia Sorrow, MD 08/09/16 [phone]
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
## 2016-08-05 NOTE — ED Notes (Signed)
Pt taken off 02 for room check. Pt saturations are 85% at this time. PA notified.

## 2016-08-05 NOTE — ED Notes (Signed)
Pt placed back on 2L [place]

## 2016-08-05 NOTE — ED Triage Notes (Signed)
Patient complains of 5 days of cough and congestion with chest pain non-radiating x 4 days. On arrival slight tachypnea with speaking, pain with inspiration. Smoker. Sats 89-90% on arrival

## 2016-08-05 NOTE — H&P (Signed)
Date: 08/05/2016               Patient Name:  Darlene Wilcox MRN: [id number]  DOB: 11-Jul-1960 Age / Sex: 56 y.o., female   PCP: Claretta Fraise, MD         Medical Service: Internal Medicine Teaching Service         Attending Physician: Dr. Lalla Brothers    First Contact: Dr. [name] Pager: [phone]  Second Contact: Dr. Shela Leff Pager: [phone]       After Hours (After 5p/  First Contact Pager: [phone]  weekends / holidays): Second Contact Pager: [phone]   Chief Complaint: Cough, chest pain  History of Present Illness: Darlene Wilcox is a 56 y.o. woman with PMH asthma, bipolar disorder, chronic back pain, tobacco use, and GERD who presents for cough, chest pain, and dyspnea on exertion. She reports that over the past week she is developed increased shortness of breath with ambulation, rhinorrhea, cough productive of green sputum, and pleuritic central chest pain. Her chest pain is substernal and feels as if "someone is punching me in the chest." It occurs intermittently for several seconds and is sometimes worsened and sometimes improved by deep breathing. She reports recent cold symptoms of congestion and rhinorrhea with her cough, denies myalgias, fevers, chills. She quit smoking a few days ago but has a 20 pack year smoking history. She endorses 4-pillow orthopnea that has worsened over the previous weeks. She is prescribed Lasix 40 mg daily but has not been taking it for the past week due to issues with urinary incontinence. She also reports copious fluid and salt intake at home. She denies edema, PND, nausea, palpitations, or history of DVT/PE. She has been visiting her boyfriend who is currently hospitalized and required repeated assistance for dyspnea and fatigue so came to the ED for evaluation.  In the ED was found to have wheezing and rales on exam and hypoxia to SpO2 80s% requiring supplemental oxygen. CXR revealed vascular congestion consistent with CHF and  she received IV Lasix, potassium, and Duoneb breathing treatment. IMTS was contacted for admission.    Meds:  Current Meds  Medication Sig  . clonazePAM (KLONOPIN) 1 MG tablet Take 1 tablet (1 mg total) by mouth 3 (three) times daily.  Marland Kitchen esomeprazole (NEXIUM) 40 MG capsule TAKE ONE CAPSULE BY MOUTH EVERY DAY  . FLUoxetine (PROZAC) 40 MG capsule Take 40 mg by mouth daily.  . fluticasone (FLOVENT HFA) 110 MCG/ACT inhaler Inhale 2 puffs into the lungs 2 (two) times daily.  . furosemide (LASIX) 40 MG tablet TAKE 1 TABLET BY MOUTH EVERY DAY (Patient taking differently: TAKE 1 TABLET BY MOUTH daily as needed for fluid)  . gabapentin (NEURONTIN) 300 MG capsule Take 300 mg by mouth 3 (three) times daily.   Marland Kitchen gemfibrozil (LOPID) 600 MG tablet Take 1 tablet (600 mg total) by mouth 2 (two) times daily before a meal.  . imipramine (TOFRANIL) 50 MG tablet TAKE 2 TABLETS BY MOUTH AT BEDTIME  . lamoTRIgine (LAMICTAL) 25 MG tablet Take 25 mg by mouth 2 (two) times daily.   . Multiple Vitamin (MULTIVITAMIN WITH MINERALS) TABS tablet Take 1 tablet by mouth daily.  Marland Kitchen MYRBETRIQ 50 MG TB24 tablet TAKE 1 TABLET (50 MG TOTAL) BY MOUTH DAILY. FOR BLADDER  . naproxen (NAPROSYN) 500 MG tablet TAKE 1 TABLET BY MOUTH TWICE A DAY WITH A MEAL  . oxcarbazepine (TRILEPTAL) 600 MG tablet Take 600 mg by mouth at  bedtime.   . polyethylene glycol powder (GLYCOLAX/MIRALAX) powder Take 17 g by mouth 2 (two) times daily as needed for moderate constipation. (Patient taking differently: Take 17 g by mouth every morning. )  . prazosin (MINIPRESS) 5 MG capsule Take 5 mg by mouth at bedtime.  Marland Kitchen PROAIR HFA 108 (90 BASE) MCG/ACT inhaler INHALE 2 PUFFS INTO THE LUNGS EVERY 4 (FOUR) HOURS AS NEEDED FOR WHEEZING OR SHORTNESS OF BREATH.  . topiramate (TOPAMAX) 200 MG tablet Take 200 mg by mouth at bedtime.  . ziprasidone (GEODON) 80 MG capsule Take 160 mg by mouth 3 (three) times daily.     Allergies: Allergies as of 08/05/2016 - Review  Complete 08/05/2016  Allergen Reaction Noted  . Apple Shortness Of Breath 01/06/2013  . Penicillins Shortness Of Breath, Itching, and Swelling 06/06/2012  . Bee venom Swelling 01/06/2013   Past Medical History:  Diagnosis Date  . Anxiety   . Asthma    daily inhaler  . Bipolar disorder ([place])   . 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 Chronic back pain greater than 3 months duration   . Depression   . Full dentures   . GERD (gastroesophageal reflux disease)   . Headache(784.0)    1-2 x/month  . History of ankle sprain    right; 2 weeks ago;  c/o severe pain  . Seizures ([place])    states "silent seizures"; is on anticonvulsant; none in 2-3 mos.    Family History:  Family History  Problem Relation Age of Onset  . Lung cancer Father   . Diabetes Maternal Grandmother     Social History:  Social History   Social History  . Marital status: Divorced    Spouse name: N/A  . Number of children: N/A  . Years of education: N/A   Occupational History  . Not on file.   Social History Main Topics  . Smoking status: Current Some Day Smoker    Packs/day: 1.00    Years: 20.00    Types: Cigarettes  . Smokeless tobacco: Never Used     Comment: pt trying to quit (04/12/2014)  . Alcohol use No  . Drug use: No  . Sexual activity: Yes    Birth control/ protection: Surgical   Other Topics Concern  . Not on file   Social History Narrative   Lives in [place], [place] with her father.     Review of Systems: A complete ROS was negative except as per HPI.   Physical Exam: Blood pressure 109/65, pulse 90, temperature 97.8 F (36.6 C), temperature source Oral, resp. rate 22, height 5\' 2"  (1.575 m), weight 175 lb (79.4 kg), SpO2 95 %.  General appearance: Elderly woman resting comfortably in bed, in no distress, disheveled and appears older than stated age HENT: Normocephalic, atraumatic, moist mucous membranes, upper and lower dentures in place, no JVD Eyes: PERRL,  non-icteric Cardiovascular: Regular rate and rhythm, no murmurs, rubs, gallops Respiratory/Chest: Bibasilar crackles, no wheeze or rhonchi, normal work of breathing, mild chest wall tenderness to palpation Abdomen: BS+, soft, non-tender, non-distended Extremities: Normal bulk and range of motion, trace ankle edema, 2+ peripheral pulses Skin: Warm, dry, intact Neuro: Alert and oriented Psych: Appropriate affect, clear speech, thoughts linear and goal-directed  EKG: Accelerated junctional rhythm, prolonged QT, QTC 675 ms  CXR: Cardiac shadow is mildly enlarged. Vascular congestion and mild interstitial edema is noted consistent with CHF. No sizable effusion is noted. No focal infiltrate is seen.  Labs remarkable for  K 3.1, BNP 46, initial trop 0.00, unremarkable CBC  Assessment & Plan by Problem: Active Problems:   Volume overload  Hypervolemia, with pulmonary vascular congestion and oxygen requirement. Clinical history most consistent with CHF but no prior echo. Some concern for ICM given smoking history and obesity. Has been previously prescribed daily Lasix but not taking this med for the past week. Consumes large amounts salt, frozen foods, water/soda/tea. Endorses orthopnea. Dry weight appears to be around 164 lbs per chart and she is currently 169-175. Bibasilar crackles on exam and requiring 2L oxygen via nasal cannula. S/p 1 dose IV Lasix 40 mg this morning. -- Repeat IV Lasix 40 mg this PM, resume home po Lasix dose tomorrow -- Strict I/Os and daily weights -- Obtain TTE -- Check lipid panel -- Telemetry and continuous pulse ox -- Advise dietary modifications  Chest pain, atypical substernal and tender to palpation, intermittent and pleuritic. EKG with no ischemic changes. Initial troponin negative. Presented with oxygen requirement. No clinical signs DVT, no recent travel, surgery, malignancy. Wells Score 0.  -- Trend serial troponins -- Follow repeat EKG tomorrow morning --  Tylenol PRN and Voltaren gel QID for pain control  Prolonged QT, likely due to plethora of psychiatric medications -- Check Mg -- Avoid all QT prolonging medications -- Follow AM EKG  Asthma, presented with some wheezing on exam, received 1x Duoneb in ED.  -- Albuterol neb Q4H PRN -- Pulmicort neb BID  Tobacco use - reports quitting 5 days ago, previous 107 PYSH, congratulated on this -- Provide Nicoderm patch if requested  Psychiatric illness, carries PMH Anxiety, Depression, Bipolar disorder -- home Clonazepam TID, Prozac, Imipramine, Prazosin, and Geodon TID  Seizure disorder -- home Lamictal, Trileptal, Topiramate  GERD -- Protonix Urge incontinence -- home Mirabegron QD HLD -- home Gemfibrozil BID, follow lipid panel Constipation  -- Miralax BID PRN  FEN/GI: Heart healthy diet, replete electrolytes as needed  DVT ppx: Lovenox  Code status: Discussed and DNR/DNI per patient request  Dispo: Admit patient to Observation with expected length of stay less than 2 midnights.  Signed: [name], MD 08/05/2016, 12:20 PM  Pager: [phone]

## 2016-08-05 NOTE — Progress Notes (Signed)
Spoke with [facility] regarding above incident.

## 2016-08-05 NOTE — Plan of Care (Signed)
Problem: Activity: Goal: Capacity to carry out activities will improve Outcome: Progressing Patient denies shortness of breath with ambulation
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
## 2016-08-05 NOTE — ED Notes (Signed)
Pt in xray
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
## 2016-08-06 ENCOUNTER — Inpatient Hospital Stay (HOSPITAL_BASED_OUTPATIENT_CLINIC_OR_DEPARTMENT_OTHER): Payer: Medicare Other

## 2016-08-06 ENCOUNTER — Other Ambulatory Visit: Payer: Self-pay | Admitting: Family Medicine

## 2016-08-06 DIAGNOSIS — G40909 Epilepsy, unspecified, not intractable, without status epilepticus: Secondary | ICD-10-CM

## 2016-08-06 DIAGNOSIS — I340 Nonrheumatic mitral (valve) insufficiency: Secondary | ICD-10-CM | POA: Diagnosis not present

## 2016-08-06 DIAGNOSIS — E785 Hyperlipidemia, unspecified: Secondary | ICD-10-CM | POA: Diagnosis not present

## 2016-08-06 DIAGNOSIS — R0902 Hypoxemia: Secondary | ICD-10-CM

## 2016-08-06 DIAGNOSIS — R079 Chest pain, unspecified: Secondary | ICD-10-CM

## 2016-08-06 DIAGNOSIS — K219 Gastro-esophageal reflux disease without esophagitis: Secondary | ICD-10-CM | POA: Diagnosis not present

## 2016-08-06 DIAGNOSIS — K5900 Constipation, unspecified: Secondary | ICD-10-CM | POA: Diagnosis not present

## 2016-08-06 DIAGNOSIS — F319 Bipolar disorder, unspecified: Secondary | ICD-10-CM

## 2016-08-06 DIAGNOSIS — J45909 Unspecified asthma, uncomplicated: Secondary | ICD-10-CM | POA: Diagnosis not present

## 2016-08-06 DIAGNOSIS — Z66 Do not resuscitate: Secondary | ICD-10-CM

## 2016-08-06 DIAGNOSIS — F418 Other specified anxiety disorders: Secondary | ICD-10-CM | POA: Diagnosis not present

## 2016-08-06 DIAGNOSIS — I509 Heart failure, unspecified: Secondary | ICD-10-CM | POA: Diagnosis not present

## 2016-08-06 DIAGNOSIS — R0781 Pleurodynia: Secondary | ICD-10-CM

## 2016-08-06 DIAGNOSIS — R9431 Abnormal electrocardiogram [ECG] [EKG]: Secondary | ICD-10-CM | POA: Insufficient documentation

## 2016-08-06 DIAGNOSIS — F17211 Nicotine dependence, cigarettes, in remission: Secondary | ICD-10-CM

## 2016-08-06 DIAGNOSIS — I4581 Long QT syndrome: Secondary | ICD-10-CM | POA: Diagnosis not present

## 2016-08-06 DIAGNOSIS — N3941 Urge incontinence: Secondary | ICD-10-CM

## 2016-08-06 DIAGNOSIS — Z79899 Other long term (current) drug therapy: Secondary | ICD-10-CM

## 2016-08-06 LAB — BASIC METABOLIC PANEL
ANION GAP: 9 (ref 5–15)
BUN: 13 mg/dL (ref 6–20)
CALCIUM: 9 mg/dL (ref 8.9–10.3)
CO2: 28 mmol/L (ref 22–32)
CREATININE: 0.86 mg/dL (ref 0.44–1.00)
Chloride: 103 mmol/L (ref 101–111)
GLUCOSE: 105 mg/dL — AB (ref 65–99)
Potassium: 3.3 mmol/L — ABNORMAL LOW (ref 3.5–5.1)
Sodium: 140 mmol/L (ref 135–145)

## 2016-08-06 LAB — ECHOCARDIOGRAM COMPLETE
CHL CUP RV SYS PRESS: 31 mmHg
CHL CUP STROKE VOLUME: 40 mL
CHL CUP TV REG PEAK VELOCITY: 264 cm/s
FS: 23 % — AB (ref 28–44)
HEIGHTINCHES: 62 in
IV/PV OW: 0.65
LA ID, A-P, ES: 39 mm
LA vol index: 29.7 mL/m2
LA vol: 52.8 mL
LADIAMINDEX: 2.19 cm/m2
LAVOLA4C: 56.9 mL
LEFT ATRIUM END SYS DIAM: 39 mm
LV PW d: 11.1 mm — AB (ref 0.6–1.1)
LV SIMPSON'S DISK: 43
LV TDI E'MEDIAL: 10.2
LV dias vol index: 52 mL/m2
LV dias vol: 92 mL (ref 46–106)
LV sys vol index: 29 mL/m2
LV sys vol: 52 mL — AB (ref 14–42)
LVELAT: 10.2 cm/s
LVOT VTI: 21.6 cm
LVOT area: 2.84 cm2
LVOT diameter: 19 mm
LVOT peak vel: 98.9 cm/s
LVOTSV: 61 mL
RV LATERAL S' VELOCITY: 11 cm/s
TAPSE: 17.8 mm
TDI e' lateral: 10.2
TRMAXVEL: 264 cm/s
Weight: 2708.8 oz

## 2016-08-06 LAB — RAPID URINE DRUG SCREEN, HOSP PERFORMED
Amphetamines: NOT DETECTED
BARBITURATES: NOT DETECTED
Benzodiazepines: NOT DETECTED
Cocaine: NOT DETECTED
Opiates: NOT DETECTED
TETRAHYDROCANNABINOL: POSITIVE — AB

## 2016-08-06 LAB — LIPID PANEL
Cholesterol: 157 mg/dL (ref 0–200)
HDL: 25 mg/dL — AB (ref >40)
LDL Cholesterol: 80 mg/dL (ref 0–99)
TRIGLYCERIDES: 261 mg/dL — AB (ref <150)
Total CHOL/HDL Ratio: 6.3 RATIO
VLDL: 52 mg/dL — ABNORMAL HIGH (ref 0–40)

## 2016-08-06 LAB — TROPONIN I

## 2016-08-06 LAB — TSH: TSH: 1.46 u[IU]/mL (ref 0.350–4.500)

## 2016-08-06 LAB — HIV ANTIBODY (ROUTINE TESTING W REFLEX): HIV Screen 4th Generation wRfx: NONREACTIVE

## 2016-08-06 MED ORDER — FUROSEMIDE 40 MG PO TABS
40.0000 mg | ORAL_TABLET | Freq: Every day | ORAL | Status: DC
  Administered 2016-08-06 – 2016-08-07 (×2): 40 mg via ORAL
  Filled 2016-08-06 (×2): qty 1

## 2016-08-06 MED ORDER — FUROSEMIDE 10 MG/ML IJ SOLN
40.0000 mg | Freq: Once | INTRAMUSCULAR | Status: AC
  Administered 2016-08-06: 40 mg via INTRAVENOUS
  Filled 2016-08-06: qty 4

## 2016-08-06 NOTE — Progress Notes (Signed)
  Echocardiogram 2D Echocardiogram has been performed.  Johny Chess 08/06/2016, 11:46 AM

## 2016-08-06 NOTE — Progress Notes (Signed)
Subjective: Chest pain and dyspnea have resolved. Feeling great this morning. States she slept well for the first time in ages last night. Endorses good urine output. Eating breakfast this morning. No complaints.   Objective: Vital signs in last 24 hours: Vitals:   08/06/16 0005 08/06/16 0510 08/06/16 0735 08/06/16 0757  BP: (!) 109/50 (!) 103/59  (!) 99/44  Pulse: 99 89  84  Resp: 20 20  20   Temp: 98.3 F (36.8 C) 98.5 F (36.9 C)  97.7 F (36.5 C)  TempSrc: Oral Oral  Oral  SpO2: 93% 96% 94% 96%  Weight:  169 lb 4.8 oz (76.8 kg)    Height:        Intake/Output Summary (Last 24 hours) at 08/06/16 1010 Last data filed at 08/06/16 3614  Gross per 24 hour  Intake              600 ml  Output             2800 ml  Net            -2200 ml    Physical Exam General appearance: Elderly woman resting comfortably in bed, in no distress HENT: Normocephalic, atraumatic Cardiovascular: Regular rate and rhythm, no murmurs, rubs, gallops Respiratory/Chest: Minimal basilar crackles, no wheeze or rhonchi, normal work of breathing Skin: Warm, dry, intact Psych: Appropriate affect, clear speech, thoughts linear and goal-directed  Labs / Imaging / Procedures: CBC Latest Ref Rng & Units 08/05/2016 07/19/2016 12/16/2015  WBC 4.0 - 10.5 K/uL 9.8 14.5(H) 11.9(H)  Hemoglobin 12.0 - 15.0 g/dL 12.1 14.2 -  Hematocrit 36.0 - 46.0 % 37.0 42.1 39.6  Platelets 150 - 400 K/uL 280 255 259   BMP Latest Ref Rng & Units 08/06/2016 08/05/2016 07/19/2016  Glucose 65 - 99 mg/dL 105(H) 100(H) 83  BUN 6 - 20 mg/dL 13 8 15   Creatinine 0.44 - 1.00 mg/dL 0.86 0.78 0.97  BUN/Creat Ratio 9 - 23 - - -  Sodium 135 - 145 mmol/L 140 140 140  Potassium 3.5 - 5.1 mmol/L 3.3(L) 3.1(L) 3.5  Chloride 101 - 111 mmol/L 103 103 105  CO2 22 - 32 mmol/L 28 26 24   Calcium 8.9 - 10.3 mg/dL 9.0 9.1 9.6   No results found.  Assessment/Plan: Darlene Wilcox is a 56 y.o. woman with PMH Asthma, Bipolar disorder, obesity,  tobacco use admitted for hypervolemia.    Principal Problem:   Volume overload Active Problems:   Bipolar disorder (HCC)   DISORDER, TOBACCO USE   Seizures (HCC)   Asthma, chronic   Chest pain   Prolonged QT interval  Hypervolemia, with pulmonary vascular congestion and oxygen requirement. Clinical history most consistent with CHF but no prior echo. Some concern for ICM given smoking history and obesity. Has been previously prescribed daily Lasix but not taking this med for the past week. Consumes large amounts salt, frozen foods, water/soda/tea. Endorses orthopnea. Dry weight appears to be around 164 lbs per chart and she is currently 169-175. Her dyspnea and chest pain have resolved this morning and she is feeling great. Weight 169 from 175 lbs however changed floors.  -- Resume home po Lasix -- Strict I/Os and daily weights -- Obtain TTE today -- Check TSH, HbA1c -- Telemetry and continuous pulse ox -- Advise dietary modifications -- Check ambulatory saturations today, if improved and TTE complete may dc home with PCP, cardiology follow up  Chest pain, atypical substernal and tender to palpation, intermittent and pleuritic. EKG  with no ischemic changes. Initial troponin negative. Presented with oxygen requirement. No clinical signs DVT, no recent travel, surgery, malignancy. Wells Score 0.  -- Serial troponins negative -- Tylenol PRN and Voltaren gel QID for pain control  Prolonged QT, likely due to plethora of psychiatric medications -- Check Mg -- Avoid all QT prolonging medications  Asthma, presented with some wheezing on exam, received 1x Duoneb in ED.  -- Albuterol neb Q4H PRN -- Pulmicort neb BID  Tobacco use - reports quitting 5 days ago, previous 1 PYSH, congratulated on this -- Provide Nicoderm patch   Psychiatric illness, carries PMH Anxiety, Depression, Bipolar disorder -- home Clonazepam TID, Prozac, Imipramine, Prazosin, and Geodon TID  Seizure  disorder -- home Lamictal, Trileptal, Topiramate  GERD -- Protonix Urge incontinence -- home Mirabegron QD HLD -- home Gemfibrozil BID, follow lipid panel Constipation  -- Miralax BID PRN  FEN/GI: Heart healthy diet, replete electrolytes as needed  DVT ppx: Lovenox  Code status: Discussed and DNR/DNI per patient request  Dispo: Anticipated discharge in approximately 0-1 days.  LOS: 1 day   Darlene Partridge, MD 08/06/2016, 10:10 AM Pager: [phone]

## 2016-08-06 NOTE — Care Management CC44 (Signed)
Condition Code 44 Documentation Completed  Patient Details  Name: Darlene Wilcox MRN: [id number] Date of Birth: 1960-10-11   Condition Code 44 given:  Yes Patient signature on Condition Code 44 notice:  Yes Documentation of 2 MD's agreement:  Yes Code 44 added to claim:  Yes    Royston Bake, RN 08/06/2016, 1:34 PM
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
## 2016-08-06 NOTE — Progress Notes (Signed)
SATURATION QUALIFICATIONS: (This note is used to comply with regulatory documentation for home oxygen)  Patient Saturations on Room Air at Rest =95%  Patient Saturations on Room Air while Ambulating = 80%  Patient Saturations on 2 Liters of oxygen while Ambulating = 82%  Please briefly explain why patient needs home oxygen:

## 2016-08-06 NOTE — Progress Notes (Signed)
O2 Sats on 2L/[place] at rest - 95% O2 Sats on room air at rest - 84%

## 2016-08-06 NOTE — Care Management Obs Status (Signed)
[place] NOTIFICATION   Patient Details  Name: Darlene Wilcox MRN: [id number] Date of Birth: 07-Jul-1960   Medicare Observation Status Notification Given:  Yes    Royston Bake, RN 08/06/2016, 1:34 PM

## 2016-08-07 ENCOUNTER — Encounter (HOSPITAL_COMMUNITY): Payer: Self-pay | Admitting: Physician Assistant

## 2016-08-07 DIAGNOSIS — G8929 Other chronic pain: Secondary | ICD-10-CM | POA: Diagnosis present

## 2016-08-07 DIAGNOSIS — F319 Bipolar disorder, unspecified: Secondary | ICD-10-CM | POA: Diagnosis present

## 2016-08-07 DIAGNOSIS — J45901 Unspecified asthma with (acute) exacerbation: Secondary | ICD-10-CM | POA: Diagnosis not present

## 2016-08-07 DIAGNOSIS — F419 Anxiety disorder, unspecified: Secondary | ICD-10-CM | POA: Diagnosis present

## 2016-08-07 DIAGNOSIS — J9601 Acute respiratory failure with hypoxia: Secondary | ICD-10-CM

## 2016-08-07 DIAGNOSIS — Z801 Family history of malignant neoplasm of trachea, bronchus and lung: Secondary | ICD-10-CM | POA: Diagnosis not present

## 2016-08-07 DIAGNOSIS — J45909 Unspecified asthma, uncomplicated: Secondary | ICD-10-CM | POA: Diagnosis not present

## 2016-08-07 DIAGNOSIS — K5900 Constipation, unspecified: Secondary | ICD-10-CM | POA: Diagnosis present

## 2016-08-07 DIAGNOSIS — G40909 Epilepsy, unspecified, not intractable, without status epilepticus: Secondary | ICD-10-CM | POA: Diagnosis present

## 2016-08-07 DIAGNOSIS — R0602 Shortness of breath: Secondary | ICD-10-CM | POA: Diagnosis present

## 2016-08-07 DIAGNOSIS — I5021 Acute systolic (congestive) heart failure: Principal | ICD-10-CM

## 2016-08-07 DIAGNOSIS — I809 Phlebitis and thrombophlebitis of unspecified site: Secondary | ICD-10-CM | POA: Diagnosis not present

## 2016-08-07 DIAGNOSIS — F17210 Nicotine dependence, cigarettes, uncomplicated: Secondary | ICD-10-CM | POA: Diagnosis present

## 2016-08-07 DIAGNOSIS — Z91018 Allergy to other foods: Secondary | ICD-10-CM | POA: Diagnosis not present

## 2016-08-07 DIAGNOSIS — I4581 Long QT syndrome: Secondary | ICD-10-CM | POA: Diagnosis not present

## 2016-08-07 DIAGNOSIS — Z981 Arthrodesis status: Secondary | ICD-10-CM | POA: Diagnosis not present

## 2016-08-07 DIAGNOSIS — Z90710 Acquired absence of both cervix and uterus: Secondary | ICD-10-CM | POA: Diagnosis not present

## 2016-08-07 DIAGNOSIS — M549 Dorsalgia, unspecified: Secondary | ICD-10-CM | POA: Diagnosis present

## 2016-08-07 DIAGNOSIS — R079 Chest pain, unspecified: Secondary | ICD-10-CM

## 2016-08-07 DIAGNOSIS — Z7951 Long term (current) use of inhaled steroids: Secondary | ICD-10-CM | POA: Diagnosis not present

## 2016-08-07 DIAGNOSIS — F17211 Nicotine dependence, cigarettes, in remission: Secondary | ICD-10-CM | POA: Diagnosis not present

## 2016-08-07 DIAGNOSIS — N3941 Urge incontinence: Secondary | ICD-10-CM | POA: Diagnosis present

## 2016-08-07 DIAGNOSIS — Z66 Do not resuscitate: Secondary | ICD-10-CM | POA: Diagnosis present

## 2016-08-07 DIAGNOSIS — E8770 Fluid overload, unspecified: Secondary | ICD-10-CM

## 2016-08-07 DIAGNOSIS — R0781 Pleurodynia: Secondary | ICD-10-CM | POA: Diagnosis not present

## 2016-08-07 DIAGNOSIS — Z880 Allergy status to penicillin: Secondary | ICD-10-CM | POA: Diagnosis not present

## 2016-08-07 DIAGNOSIS — E785 Hyperlipidemia, unspecified: Secondary | ICD-10-CM | POA: Diagnosis present

## 2016-08-07 DIAGNOSIS — Z91030 Bee allergy status: Secondary | ICD-10-CM | POA: Diagnosis not present

## 2016-08-07 DIAGNOSIS — I509 Heart failure, unspecified: Secondary | ICD-10-CM

## 2016-08-07 DIAGNOSIS — Z833 Family history of diabetes mellitus: Secondary | ICD-10-CM | POA: Diagnosis not present

## 2016-08-07 DIAGNOSIS — I808 Phlebitis and thrombophlebitis of other sites: Secondary | ICD-10-CM | POA: Diagnosis not present

## 2016-08-07 DIAGNOSIS — K219 Gastro-esophageal reflux disease without esophagitis: Secondary | ICD-10-CM | POA: Diagnosis not present

## 2016-08-07 DIAGNOSIS — F418 Other specified anxiety disorders: Secondary | ICD-10-CM | POA: Diagnosis not present

## 2016-08-07 DIAGNOSIS — R0789 Other chest pain: Secondary | ICD-10-CM | POA: Diagnosis not present

## 2016-08-07 LAB — BASIC METABOLIC PANEL
ANION GAP: 11 (ref 5–15)
BUN: 18 mg/dL (ref 6–20)
CALCIUM: 9.2 mg/dL (ref 8.9–10.3)
CO2: 25 mmol/L (ref 22–32)
CREATININE: 0.91 mg/dL (ref 0.44–1.00)
Chloride: 102 mmol/L (ref 101–111)
Glucose, Bld: 109 mg/dL — ABNORMAL HIGH (ref 65–99)
Potassium: 4 mmol/L (ref 3.5–5.1)
SODIUM: 138 mmol/L (ref 135–145)

## 2016-08-07 LAB — HEMOGLOBIN A1C
Hgb A1c MFr Bld: 5.4 % (ref 4.8–5.6)
MEAN PLASMA GLUCOSE: 108 mg/dL

## 2016-08-07 LAB — MAGNESIUM: MAGNESIUM: 1.7 mg/dL (ref 1.7–2.4)

## 2016-08-07 MED ORDER — IPRATROPIUM-ALBUTEROL 0.5-2.5 (3) MG/3ML IN SOLN
3.0000 mL | Freq: Four times a day (QID) | RESPIRATORY_TRACT | Status: DC | PRN
Start: 2016-08-07 — End: 2016-08-09

## 2016-08-07 MED ORDER — FUROSEMIDE 10 MG/ML IJ SOLN
40.0000 mg | Freq: Two times a day (BID) | INTRAMUSCULAR | Status: DC
  Administered 2016-08-07 – 2016-08-08 (×2): 40 mg via INTRAVENOUS
  Filled 2016-08-07 (×2): qty 4

## 2016-08-07 MED ORDER — MAGNESIUM SULFATE 2 GM/50ML IV SOLN
2.0000 g | Freq: Once | INTRAVENOUS | Status: AC
  Administered 2016-08-07: 2 g via INTRAVENOUS
  Filled 2016-08-07: qty 50

## 2016-08-07 MED ORDER — ASPIRIN 81 MG PO CHEW
81.0000 mg | CHEWABLE_TABLET | Freq: Every day | ORAL | Status: DC
  Administered 2016-08-07 – 2016-08-09 (×4): 81 mg via ORAL
  Filled 2016-08-07 (×4): qty 1

## 2016-08-07 MED ORDER — FUROSEMIDE 40 MG PO TABS: 40.0000 mg | ORAL_TABLET | Freq: Two times a day (BID) | ORAL | Status: DC

## 2016-08-07 MED ORDER — IPRATROPIUM-ALBUTEROL 0.5-2.5 (3) MG/3ML IN SOLN
3.0000 mL | Freq: Four times a day (QID) | RESPIRATORY_TRACT | Status: DC
  Administered 2016-08-07: 3 mL via RESPIRATORY_TRACT
  Filled 2016-08-07: qty 3

## 2016-08-07 NOTE — Progress Notes (Addendum)
Pt. Alert and awake during the night. This am Pt. Now asleep and Able to be aroused to take meds.  Pt. Unable to stand to get a standing weight. Pt. Resting in bed. On coming RN made aware.

## 2016-08-07 NOTE — Progress Notes (Signed)
Patient stable during 7 a to 7 p shift, did walk with mobility tech, sat in chair during shift and got up and washed up at sink.  Patients breathing often sounds labored, O2 sats in the high 80's on room air, 90s with oxygen on.  Patient denies any chest pain.  Awaiting cath tomorrow.  Verbalizes understanding she is nothing by mouth after midnight.

## 2016-08-07 NOTE — Consult Note (Signed)
CARDIOLOGY CONSULT NOTE   Patient ID: Darlene Wilcox MRN: [id number] DOB/AGE: 1960/11/14 56 y.o.  Admit date: 08/05/2016  Primary Physician   Claretta Fraise, MD Primary Cardiologist   New, Dr Johnsie Cancel Reason for Consultation   CHF Requesting MD: Dr Dyke Brackett  [id number] Darlene Wilcox is a 56 y.o. year old female with a history of asthma, bipolar disorder, chronic back pain, tobacco use, and GERD. Pt does not see primary care regularly. Pt takes care of her father, that keeps her very busy.  She was admitted 03/11 with SOB, CHF, prolonged QT (on multiple psych meds that were stopped). She improved with diuresis, EF slightly decreased on echo, QT normalized. Cardiology consulted.   The SOB started about a year ago. Family noticed she was breathing harder with simple things like walking around in the house.   Pt is aware that her breathing was worse than usual, but did not have clear awareness of how bad it was. She describes orthopnea and PND. No LE edema upon waking, would get it during the day. She was wheezing a great deal. No recent weight change. She is not compliant w/ Lasix due to scheduling. Occ problems w/ incontinence. Does not follow a low-Na diet.  The chest pain started on 03/11, she was driving. It started in the setting of emotional stress but not physical stress. She was visiting a friend, but the pain became worse and she went to the ER. It was initially aching, but became sharp. That is when she went to the ER. 7/10 at its worst. She got Lasix and nebulizers, her symptoms improved. She did not get ASA or nitrates.   The chest pain has not returned. She has no history of exertional chest pain. Long hx exertional SOB.  Past Medical History:  Diagnosis Date  . Anxiety   . Asthma    daily inhaler  . Bipolar disorder ([place])   . 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 Chronic back pain greater than 3 months duration   . Depression   . Full dentures   . GERD  (gastroesophageal reflux disease)   . Headache(784.0)    1-2 x/month  . History of ankle sprain    right; 2 weeks ago;  c/o severe pain  . Seizures ([place])    states "silent seizures"; is on anticonvulsant; none in 2-3 mos.     Past Surgical History:  Procedure Laterality Date  . ABDOMINAL HYSTERECTOMY     partial  . APPENDECTOMY    . BUNIONECTOMY Left   . CARPAL TUNNEL RELEASE Right 01/13/2013   Procedure: CARPAL TUNNEL RELEASE;  Surgeon: Cammie Sickle., MD;  Location: [facility];  Service: Orthopedics;  Laterality: Right;  . CERVICAL FUSION     x 2  . DIAGNOSTIC LAPAROSCOPY  11/08/2006  . INGUINAL HERNIA REPAIR Right   . LAPAROSCOPIC APPENDECTOMY  11/08/2006  . LUMBAR FUSION  06/28/2014   L2-5  . LUMBAR LAMINECTOMY  02/05/2007   L4-5 fusion  . TUBAL LIGATION      Allergies  Allergen Reactions  . Apple Shortness Of Breath  . Penicillins Shortness Of Breath, Itching and Swelling    Has patient had a PCN reaction causing immediate rash, facial/tongue/throat swelling, SOB or lightheadedness with hypotension: Unknown, childhood reaction Has patient had a PCN reaction causing severe rash involving mucus membranes or skin necrosis: No Has patient had a PCN reaction that required hospitalization No Has patient had a PCN  reaction occurring within the last 10 years: No If all of the above answers are "NO", then may proceed with Cephalosporin use.   . Bee Venom Swelling    Takes benadryl to help with reaction    I have reviewed the patient's current medications . budesonide (PULMICORT) nebulizer solution  0.5 mg Nebulization BID  . clonazePAM  1 mg Oral TID  . diclofenac sodium  2 g Topical QID  . enoxaparin (LOVENOX) injection  40 mg Subcutaneous Q24H  . FLUoxetine  40 mg Oral Daily  . furosemide  40 mg Intravenous BID  . gabapentin  300 mg Oral TID  . gemfibrozil  600 mg Oral BID AC  . imipramine  100 mg Oral QHS  . lamoTRIgine  25 mg Oral BID  . mouth rinse   15 mL Mouth Rinse BID  . mirabegron ER  50 mg Oral Daily  . multivitamin with minerals  1 tablet Oral Daily  . nicotine  21 mg Transdermal Daily  . oxcarbazepine  600 mg Oral QHS  . pantoprazole  40 mg Oral Daily  . potassium chloride  40 mEq Oral BID  . prazosin  5 mg Oral QHS  . topiramate  200 mg Oral QHS  . ziprasidone  160 mg Oral TID    acetaminophen **OR** acetaminophen, ipratropium-albuterol, polyethylene glycol powder  Medication Sig  clonazePAM (KLONOPIN) 1 MG tablet Take 1 tablet (1 mg total) by mouth 3 (three) times daily.  esomeprazole (NEXIUM) 40 MG capsule TAKE ONE CAPSULE BY MOUTH EVERY DAY  FLUoxetine (PROZAC) 40 MG capsule Take 40 mg by mouth daily.  fluticasone (FLOVENT HFA) 110 MCG/ACT inhaler Inhale 2 puffs into the lungs 2 (two) times daily.  furosemide (LASIX) 40 MG tablet TAKE 1 TABLET BY MOUTH EVERY DAY Patient taking differently: TAKE 1 TABLET BY MOUTH daily as needed for fluid  gabapentin (NEURONTIN) 300 MG capsule Take 300 mg by mouth 3 (three) times daily.   gemfibrozil (LOPID) 600 MG tablet Take 1 tablet (600 mg total) by mouth 2 (two) times daily before a meal.  imipramine (TOFRANIL) 50 MG tablet TAKE 2 TABLETS BY MOUTH AT BEDTIME  lamoTRIgine (LAMICTAL) 25 MG tablet Take 25 mg by mouth 2 (two) times daily.   Multiple Vitamin (MULTIVITAMIN WITH MINERALS) TABS tablet Take 1 tablet by mouth daily.  MYRBETRIQ 50 MG TB24 tablet TAKE 1 TABLET (50 MG TOTAL) BY MOUTH DAILY. FOR BLADDER  naproxen (NAPROSYN) 500 MG tablet TAKE 1 TABLET BY MOUTH TWICE A DAY WITH A MEAL  oxcarbazepine (TRILEPTAL) 600 MG tablet Take 600 mg by mouth at bedtime.   polyethylene glycol powder (GLYCOLAX/MIRALAX) powder Take 17 g by mouth 2 (two) times daily as needed for moderate constipation. Patient taking differently: Take 17 g by mouth every morning.   prazosin (MINIPRESS) 5 MG capsule Take 5 mg by mouth at bedtime.  PROAIR HFA 108 (90 BASE) MCG/ACT inhaler INHALE 2 PUFFS INTO THE  LUNGS EVERY 4 (FOUR) HOURS AS NEEDED FOR WHEEZING OR SHORTNESS OF BREATH.  topiramate (TOPAMAX) 200 MG tablet Take 200 mg by mouth at bedtime.  ziprasidone (GEODON) 80 MG capsule Take 160 mg by mouth 3 (three) times daily.   potassium chloride (K-DUR) 10 MEQ tablet Take 4 tablets (40 mEq total) by mouth once.  potassium chloride SA (K-DUR,KLOR-CON) 20 MEQ tablet Take 2 tablets (40 mEq total) by mouth once.  potassium chloride SA (K-DUR,KLOR-CON) 20 MEQ tablet Take 2 tablets (40 mEq total) by mouth once.  Social History   Social History  . Marital status: Divorced    Spouse name: N/A  . Number of children: N/A  . Years of education: N/A   Occupational History  . Unemployed    Social History Main Topics  . Smoking status: Current Some Day Smoker    Packs/day: 1.00    Years: 20.00    Types: Cigarettes  . Smokeless tobacco: Never Used     Comment:    . Alcohol use No  . Drug use: No  . Sexual activity: Yes    Birth control/ protection: Surgical   Other Topics Concern  . Not on file   Social History Narrative   Lives in [place], [place] with her father.     Family Status  Relation Status  . Father Deceased  . Mother Alive  . Maternal Grandmother   . Neg Hx    Family History  Problem Relation Age of Onset  . Lung cancer Father   . Diabetes Maternal Grandmother   . Deep vein thrombosis Neg Hx      ROS:  Full 14 point review of systems complete and found to be negative unless listed above.  Physical Exam: Blood pressure 104/65, pulse 100, temperature 98.5 F (36.9 C), temperature source Oral, resp. rate 20, height 5\' 2"  (1.575 m), weight 165 lb 9.1 oz (75.1 kg), SpO2 90 %.  General: Well developed, well nourished, female in no acute distress Head: Eyes PERRLA, No xanthomas.   Normocephalic and atraumatic, oropharynx without edema or exudate. Dentition: fair Lungs: bilateral rales, no wheeze Heart: HRRR S1 S2, no rub/gallop, no murmur. pulses are 2+ all 4 extrem.     Neck: No carotid bruits. No lymphadenopathy.  JVD not elevated but difficult to assess 2nd pt shivering Abdomen: Bowel sounds present, abdomen soft and non-tender without masses or hernias noted. Msk:  No spine or cva tenderness. No weakness, no joint deformities or effusions. Extremities: No clubbing or cyanosis. No edema.  Neuro: Alert and oriented X 3. No focal deficits noted. Psych:  Good affect, responds appropriately Skin: No rashes or lesions noted.  Labs:   Lab Results  Component Value Date   WBC 9.8 08/05/2016   HGB 12.1 08/05/2016   HCT 37.0 08/05/2016   MCV 84.1 08/05/2016   PLT 280 08/05/2016    Recent Labs Lab 08/07/16 0508  NA 138  K 4.0  CL 102  CO2 25  BUN 18  CREATININE 0.91  CALCIUM 9.2  GLUCOSE 109*   Magnesium  Date Value Ref Range Status  08/07/2016 1.7 1.7 - 2.4 mg/dL Final    Recent Labs  08/05/16 1702 08/05/16 2112 08/06/16 0243  TROPONINI 0.03* <0.03 <0.03    Recent Labs  08/05/16 0858  TROPIPOC 0.00   B Natriuretic Peptide  Date/Time Value Ref Range Status  08/05/2016 08:47 AM 46.0 0.0 - 100.0 pg/mL Final   Lab Results  Component Value Date   CHOL 157 08/06/2016   HDL 25 (L) 08/06/2016   LDLCALC 80 08/06/2016   TRIG 261 (H) 08/06/2016    TSH  Date/Time Value Ref Range Status  08/06/2016 10:18 AM 1.460 0.350 - 4.500 uIU/mL Final    Echo: 08/06/2016 - Left ventricle: The cavity size was normal. Wall thickness was   normal. Systolic function was mildly to moderately reduced. The   estimated ejection fraction was in the range of 40% to 45%.   Diffuse hypokinesis. - Mitral valve: There was mild regurgitation. - Pulmonary arteries: Systolic  pressure was mildly increased. PA   peak pressure: 31 mm Hg (S).  ECG:  03/13 ST, HR 129 No acute changes  Cath: n/a  Radiology:   Dg Chest 2 View Result Date: 08/05/2016 CLINICAL DATA:  Shortness of breath and chest pain for 5 days EXAM: CHEST  2 VIEW COMPARISON:  06/01/2016  FINDINGS: Cardiac shadow is mildly enlarged. Vascular congestion and mild interstitial edema is noted consistent with CHF. No sizable effusion is noted. No focal infiltrate is seen. IMPRESSION: Changes of mild CHF. Electronically Signed   By: Inez Catalina M.D.   On: 08/05/2016 09:22   ASSESSMENT AND PLAN:   The patient was seen today by Dr Johnsie Cancel, the patient evaluated and the data reviewed.     Acute systolic CHF:  - unknown chronicity - needs to continue on diuretics, compliance emphasized - needs ACE or ARB and BB as well.  - With asthma exacerbation, would not start BB now, may not be able to use at all.  - Asthma may be the reason she is not on ASA.  - unknown cause of LVD, MD advise on ischemic evaluation and its timing.   Principal Problem:    Volume overload - see above  Active Problems:   Bipolar disorder (HCC) - PTA was on Klonopin 1 mg tid, Prozac 40 mg qd, Neurontin 300 mg tid, Tofranil 100 mg qhs, Lamictal 25 mg bid, Trileptal 600 mg qhs, Topamax 200 mg qhs, Geodon 160 mg tid. - Meds adjusted to decrease QT interval    Prolonged QT interval - ECG machine read QT on admission as >500 ms, but I get 320 ms. That would make QTc 428 ms. - QT 300 ms today with QTc 429 ms. - MD to review    Chest pain - no hx exertional CP - CP started in the setting of significant respiratory distress and resolved with improvement in respiratory status, without any cardiac meds - ez neg MI - However, pt has LVD - MD advise on stress testing vs R/L heart cath  Otherwise, per IM   DISORDER, TOBACCO USE   Seizures ([place])   Asthma, chronic   Acute congestive heart failure ([place])   CHF exacerbation ([place])   SignedRosaria Ferries, PA-C 08/07/2016 2:54 PM Beeper [phone]  Co-Sign MD  Patient examined chart reviewed. Discussed care plan with patient and daughter. Exertional chest pain Lots of stress ECG no acute changes with troponin .03 clinical CHF with some improvement with diuresis.  EF 40-45% by echo Favor right and left cath in am to define anatomy and guide Rx. She is a heavy smoker and this also contributes to her dyspnea.  Risks discussed with patient lab called orders written. Exam with Anxious white female no murmur basilar crackles and trace LE edema.   Jenkins Rouge, MD

## 2016-08-07 NOTE — Progress Notes (Signed)
EKG completed

## 2016-08-07 NOTE — Progress Notes (Addendum)
Internal Medicine Attending:   I saw and examined the patient. I reviewed the resident's note and I agree with the resident's findings and plan as documented in the resident's note.  Hospital day #2 for acute hypoxic respiratory failure due to pulmonary edema. This is improving with diuresis, continue with furosemide 40mg  IV bid today with goal negative 1-2 L. Wean oxygen as able and ambulate the patient today. Mag was low due to diuresis, please replete. Patient will need an ischemic evaluation due to reduced EF and diffuse hypokinesis, cardiology consulted.

## 2016-08-07 NOTE — Progress Notes (Addendum)
Subjective: Darlene Wilcox is awake sitting up in bed and eating breakfast this morning. She is in good spirits and reports no difficulties current dyspnea or chest pain. Earlier this morning she was very sleepy and slow to wake but appears to have stayed up all night watching TV. She was wheezing and had a breathing treatment this morning and that it has helped. She is very happy that she has quit smoking for the past 7 days and is experiencing no cravings.  Yesterday on ambulation her oxygen saturation dropped to the low 80s and she became dyspneic, off supplementary O2, constistent with hypoxemic respiratory failure. Her echocardiogram also revealed systolic heart failure.  Telemetry reviewed and showed episode of sinus tachycardia to HR 120s around 3AM, EKG reviewed as well - QT length normalized.  Objective: Vital signs in last 24 hours: Vitals:   08/06/16 2125 08/06/16 2225 08/07/16 0004 08/07/16 0646  BP:  (!) 110/55 108/60 (!) 92/45  Pulse: 99 (!) 102 93 (!) 109  Resp: 20 20 18 20   Temp:  97.9 F (36.6 C) 97.5 F (36.4 C) 99.3 F (37.4 C)  TempSrc:  Oral Oral Oral  SpO2: 94% 94% 100% 95%  Weight:    165 lb 9.1 oz (75.1 kg)  Height:        Intake/Output Summary (Last 24 hours) at 08/07/16 1275 Last data filed at 08/07/16 1700  Gross per 24 hour  Intake             1420 ml  Output             3850 ml  Net            -2430 ml    Physical Exam General appearance: Elderly woman resting comfortably in bed, in no distress, pleasant and conversational HENT: Normocephalic, atraumatic Cardiovascular: Regular rate and rhythm, no murmurs, rubs, gallops Respiratory/Chest: Minimal basilar crackles, no wheeze or rhonchi, mild tachypnea, normal work of breathing Skin: Warm, dry, intact Psych: Positive affect, clear speech  Labs / Imaging / Procedures: CBC Latest Ref Rng & Units 08/05/2016 07/19/2016 12/16/2015  WBC 4.0 - 10.5 K/uL 9.8 14.5(H) 11.9(H)  Hemoglobin 12.0 - 15.0 g/dL 12.1  14.2 -  Hematocrit 36.0 - 46.0 % 37.0 42.1 39.6  Platelets 150 - 400 K/uL 280 255 259   BMP Latest Ref Rng & Units 08/07/2016 08/06/2016 08/05/2016  Glucose 65 - 99 mg/dL 109(H) 105(H) 100(H)  BUN 6 - 20 mg/dL 18 13 8   Creatinine 0.44 - 1.00 mg/dL 0.91 0.86 0.78  BUN/Creat Ratio 9 - 23 - - -  Sodium 135 - 145 mmol/L 138 140 140  Potassium 3.5 - 5.1 mmol/L 4.0 3.3(L) 3.1(L)  Chloride 101 - 111 mmol/L 102 103 103  CO2 22 - 32 mmol/L 25 28 26   Calcium 8.9 - 10.3 mg/dL 9.2 9.0 9.1   No results found.  Assessment/Plan: Darlene Wilcox is a 56 y.o. woman with PMH Asthma, Bipolar disorder, obesity, tobacco use admitted for hypervolemia.    Principal Problem:   Volume overload Active Problems:   Bipolar disorder (HCC)   DISORDER, TOBACCO USE   Seizures (HCC)   Asthma, chronic   Chest pain   Prolonged QT interval  Acute systolic heart failure, resulting in pulmonary edema and hypoxemic respiratory failure. CXR revealed pulmonary vascular congestion and ongoing oxygen requirement. TTE showed LVEF 40-45% and diffuse hypokinesis. Multiple cardiac risk factors, smoking, obesity, metabolic syndrome from psych meds Lipids, TSH, A1c wnl. Her dyspnea and  chest pain have resolved but her echo results are concerning for ischemic heart disease, and her pulmonary edema and oxygen requirement remain -- Cardiology consult today, appreciate recs -- Lasix 40 mg po BID (2x home dose) -- Strict I/Os and daily weights -- Telemetry and continuous pulse ox -- Advise dietary modifications -- Check ambulatory saturations again today -- Holding off starting beta blocker, ACEi in setting of some hypotension this morning and potential ischemic eval soon  Chest pain, resolved, atypical substernal and tender to palpation, intermittent and pleuritic. EKG with no ischemic changes. Initial troponin negative. Presented with oxygen requirement. No clinical signs DVT, no recent travel, surgery, malignancy. Wells Score 0.  Serial troponins negative -- Tylenol PRN and Voltaren gel QID for pain control  Prolonged QT, present on admit and above 600. Likely due to plethora of psychiatric medications, Mg 1.7 on admit and again today. EKG overnight showed normalized QT at 440 ms -- Give 2g IV Mg today -- Avoid all QT prolonging medications  Asthma, vs undiagnosed COPD, 20+ pack year smoking history -- Duoneb neb Q6H PRN -- Pulmicort neb BID  Tobacco use - recently with previous 55 PYSH, congratulated on this -- Provide Nicoderm patch   Psychiatric illness, carries PMH Anxiety, Depression, Bipolar disorder -- home Clonazepam TID, Prozac, Imipramine, Prazosin, and Geodon TID  Seizure disorder -- home Lamictal, Trileptal, Topiramate  GERD -- Protonix Urge incontinence -- home Mirabegron QD HLD -- home Gemfibrozil BID Constipation  -- Miralax BID PRN  FEN/GI: Heart healthy diet, replete electrolytes as needed  DVT ppx: Lovenox  Code status: Discussed and DNR/DNI per patient request  Dispo: Anticipated discharge in approximately 1-2 days.  LOS: 1 day   [name], MD 08/07/2016, 7:42 AM Pager: [phone]

## 2016-08-07 NOTE — Progress Notes (Signed)
Pt. With HR in 140s-150s. Pt. Up to [facility], RN at bedside. Pt. Asymptomatic, denies pain or discomfort. On call for IMTS paged to make aware. RN will continue to monitor. Akshith Moncus, Katherine Roan

## 2016-08-08 ENCOUNTER — Encounter (HOSPITAL_COMMUNITY)
Admission: EM | Disposition: A | Discharge status: Home / Self Care | Payer: Self-pay | Source: Home / Self Care | Attending: Student in an Organized Health Care Education/Training Program

## 2016-08-08 DIAGNOSIS — R0789 Other chest pain: Secondary | ICD-10-CM

## 2016-08-08 DIAGNOSIS — F17200 Nicotine dependence, unspecified, uncomplicated: Secondary | ICD-10-CM

## 2016-08-08 HISTORY — PX: LEFT HEART CATH AND CORONARY ANGIOGRAPHY: CATH118249

## 2016-08-08 LAB — BASIC METABOLIC PANEL
Anion gap: 11 (ref 5–15)
BUN: 18 mg/dL (ref 6–20)
CHLORIDE: 98 mmol/L — AB (ref 101–111)
CO2: 26 mmol/L (ref 22–32)
CREATININE: 0.83 mg/dL (ref 0.44–1.00)
Calcium: 8.7 mg/dL — ABNORMAL LOW (ref 8.9–10.3)
GFR calc Af Amer: >60 mL/min (ref >60)
GFR calc non Af Amer: >60 mL/min (ref >60)
Glucose, Bld: 109 mg/dL — ABNORMAL HIGH (ref 65–99)
Potassium: 4.6 mmol/L (ref 3.5–5.1)
SODIUM: 135 mmol/L (ref 135–145)

## 2016-08-08 LAB — CARDIAC CATHETERIZATION: CATHEFQUANT: 40 %

## 2016-08-08 LAB — PROTIME-INR
INR: 1.07
PROTHROMBIN TIME: 13.9 s (ref 11.4–15.2)

## 2016-08-08 LAB — MAGNESIUM: MAGNESIUM: 2.3 mg/dL (ref 1.7–2.4)

## 2016-08-08 SURGERY — LEFT HEART CATH AND CORONARY ANGIOGRAPHY
Anesthesia: LOCAL

## 2016-08-08 MED ORDER — SODIUM CHLORIDE 0.9 % IV SOLN: 250.0000 mL | INTRAVENOUS | Status: DC | PRN

## 2016-08-08 MED ORDER — VERAPAMIL HCL 2.5 MG/ML IV SOLN
INTRAVENOUS | Status: DC | PRN
  Administered 2016-08-08: 10 mL via INTRA_ARTERIAL

## 2016-08-08 MED ORDER — SODIUM CHLORIDE 0.9 % IV SOLN
INTRAVENOUS | Status: AC
  Administered 2016-08-08: 17:00:00 via INTRAVENOUS

## 2016-08-08 MED ORDER — FUROSEMIDE 20 MG PO TABS
20.0000 mg | ORAL_TABLET | Freq: Every day | ORAL | Status: DC
  Administered 2016-08-09: 20 mg via ORAL
  Filled 2016-08-08: qty 1

## 2016-08-08 MED ORDER — VERAPAMIL HCL 2.5 MG/ML IV SOLN
INTRAVENOUS | Status: AC
  Filled 2016-08-08: qty 2

## 2016-08-08 MED ORDER — SODIUM CHLORIDE 0.9% FLUSH
3.0000 mL | Freq: Two times a day (BID) | INTRAVENOUS | Status: DC
  Administered 2016-08-08: 3 mL via INTRAVENOUS

## 2016-08-08 MED ORDER — HEPARIN (PORCINE) IN NACL 2-0.9 UNIT/ML-% IJ SOLN
INTRAMUSCULAR | Status: DC | PRN
  Administered 2016-08-08: 1000 mL

## 2016-08-08 MED ORDER — FENTANYL CITRATE (PF) 100 MCG/2ML IJ SOLN
INTRAMUSCULAR | Status: AC
  Filled 2016-08-08: qty 2

## 2016-08-08 MED ORDER — FENTANYL CITRATE (PF) 100 MCG/2ML IJ SOLN
INTRAMUSCULAR | Status: DC | PRN
  Administered 2016-08-08: 50 ug via INTRAVENOUS

## 2016-08-08 MED ORDER — HEPARIN SODIUM (PORCINE) 1000 UNIT/ML IJ SOLN
INTRAMUSCULAR | Status: AC
  Filled 2016-08-08: qty 1

## 2016-08-08 MED ORDER — LIDOCAINE HCL (PF) 1 % IJ SOLN
INTRAMUSCULAR | Status: DC | PRN
Start: 2016-08-08 — End: 2016-08-08
  Administered 2016-08-08 (×2): 2 mL

## 2016-08-08 MED ORDER — SODIUM CHLORIDE 0.9% FLUSH: 3.0000 mL | INTRAVENOUS | Status: DC | PRN

## 2016-08-08 MED ORDER — IOPAMIDOL (ISOVUE-370) INJECTION 76%
INTRAVENOUS | Status: AC
  Filled 2016-08-08: qty 100

## 2016-08-08 MED ORDER — MIDAZOLAM HCL 2 MG/2ML IJ SOLN
INTRAMUSCULAR | Status: AC
  Filled 2016-08-08: qty 2

## 2016-08-08 MED ORDER — SODIUM CHLORIDE 0.9 % IV SOLN
INTRAVENOUS | Status: DC
  Administered 2016-08-08: 50 mL/h via INTRAVENOUS

## 2016-08-08 MED ORDER — DM-GUAIFENESIN ER 30-600 MG PO TB12
1.0000 | ORAL_TABLET | Freq: Two times a day (BID) | ORAL | Status: DC
  Administered 2016-08-08 – 2016-08-09 (×2): 1 via ORAL
  Filled 2016-08-08 (×2): qty 1

## 2016-08-08 MED ORDER — HEPARIN SODIUM (PORCINE) 1000 UNIT/ML IJ SOLN
INTRAMUSCULAR | Status: DC | PRN
  Administered 2016-08-08: 4000 [IU] via INTRAVENOUS

## 2016-08-08 MED ORDER — MIDAZOLAM HCL 2 MG/2ML IJ SOLN
INTRAMUSCULAR | Status: DC | PRN
  Administered 2016-08-08: 1 mg via INTRAVENOUS

## 2016-08-08 MED ORDER — IOPAMIDOL (ISOVUE-370) INJECTION 76%
INTRAVENOUS | Status: DC | PRN
  Administered 2016-08-08: 65 mL via INTRA_ARTERIAL

## 2016-08-08 SURGICAL SUPPLY — 12 items
CATH INFINITI 5FR JL4 (CATHETERS) ×1 IMPLANT
CATH INFINITI JR4 5F (CATHETERS) ×1 IMPLANT
CATH OPTITORQUE TIG 4.0 5F (CATHETERS) ×1 IMPLANT
DEVICE RAD COMP TR BAND LRG (VASCULAR PRODUCTS) ×1 IMPLANT
GLIDESHEATH SLEND SS 6F .021 (SHEATH) ×1 IMPLANT
GUIDEWIRE INQWIRE 1.5J.035X260 (WIRE) IMPLANT
INQWIRE 1.5J .035X260CM (WIRE) ×2
KIT HEART LEFT (KITS) ×2 IMPLANT
PACK CARDIAC CATHETERIZATION (CUSTOM PROCEDURE TRAY) ×2 IMPLANT
SHEATH FAST CATH BRACH 5F 5CM (SHEATH) ×1 IMPLANT
TRANSDUCER W/STOPCOCK (MISCELLANEOUS) ×2 IMPLANT
TUBING CIL FLEX 10 FLL-RA (TUBING) ×2 IMPLANT

## 2016-08-08 NOTE — Progress Notes (Signed)
Pt back from cath lab with TR band on L radial at 11cc. Pt in stable condition, frequent vital signs set up. Pt educated to keep arm still. Pt verbalized understanding.

## 2016-08-08 NOTE — Progress Notes (Addendum)
Subjective: Darlene Wilcox has no complaints this morning, denies any recurrence of chest pain or shortness of breath. Reports she has been ambulating in the hall. Good urinary output with IV lasix. Anxious to undergo cath procedure today. States she is feeling very hungry and noticed a slightly painful area on her right arm.    Net -1.6 L and +1 lb today. Tele reviewed, few episodes sinus tach to 120s  Objective: Vital signs in last 24 hours: Vitals:   08/07/16 1154 08/07/16 2135 08/08/16 0603 08/08/16 0736  BP: 104/65 106/62 (!) 104/46   Pulse: 100 (!) 104 91 90  Resp: 20 20 20 16   Temp: 98.5 F (36.9 C) 98.7 F (37.1 C) 97.5 F (36.4 C)   TempSrc: Oral Oral Oral   SpO2: 90% 92% 92% 92%  Weight:   166 lb 8 oz (75.5 kg)   Height:        Intake/Output Summary (Last 24 hours) at 08/08/16 0759 Last data filed at 08/08/16 7412  Gross per 24 hour  Intake           448.33 ml  Output             2100 ml  Net         -1651.67 ml   Filed Weights   08/06/16 0510 08/07/16 0646 08/08/16 0603  Weight: 169 lb 4.8 oz (76.8 kg) 165 lb 9.1 oz (75.1 kg) 166 lb 8 oz (75.5 kg)   Physical Exam General appearance: Elderly woman sitting comfortably in bed, in no distress, pleasant and conversational HENT: Normocephalic, moist mucous membranes Cardiovascular: Regular rate and rhythm, no murmurs, rubs, gallops Respiratory/Chest: Mild bibasilar crackles, no wheeze or rhonchi, normal work of breathing Skin: Warm, dry, intact, small area of erythema and mild tenderness over right AV fossa, site of previous IV Psych: Positive affect, clear speech  Labs / Imaging / Procedures: CBC Latest Ref Rng & Units 08/05/2016 07/19/2016 12/16/2015  WBC 4.0 - 10.5 K/uL 9.8 14.5(H) 11.9(H)  Hemoglobin 12.0 - 15.0 g/dL 12.1 14.2 -  Hematocrit 36.0 - 46.0 % 37.0 42.1 39.6  Platelets 150 - 400 K/uL 280 255 259   BMP Latest Ref Rng & Units 08/08/2016 08/07/2016 08/06/2016  Glucose 65 - 99 mg/dL 109(H) 109(H) 105(H)    BUN 6 - 20 mg/dL 18 18 13   Creatinine 0.44 - 1.00 mg/dL 0.83 0.91 0.86  BUN/Creat Ratio 9 - 23 - - -  Sodium 135 - 145 mmol/L 135 138 140  Potassium 3.5 - 5.1 mmol/L 4.6 4.0 3.3(L)  Chloride 101 - 111 mmol/L 98(L) 102 103  CO2 22 - 32 mmol/L 26 25 28   Calcium 8.9 - 10.3 mg/dL 8.7(L) 9.2 9.0   Mg 2.3  No results found.  Assessment/Plan: Darlene Wilcox is a 56 y.o. woman with PMH Asthma, Bipolar disorder, obesity, tobacco use admitted for acute systolic hear failure and hypoxia from pulmonary edema.   Principal Problem:   Volume overload Active Problems:   Bipolar disorder (HCC)   DISORDER, TOBACCO USE   Seizures (HCC)   Asthma, chronic   Chest pain   Prolonged QT interval   Acute systolic CHF (congestive heart failure) (HCC)   CHF exacerbation (HCC)  Acute systolic heart failure, resulting in pulmonary edema and hypoxemic respiratory failure. CXR revealed pulmonary vascular congestion and ongoing oxygen requirement. TTE showed LVEF 40-45% and diffuse hypokinesis. Multiple cardiac risk factors, smoking, obesity, metabolic syndrome from psych meds Lipids, TSH, A1c wnl. Her dyspnea and  chest pain have resolved but her echo results are concerning for ischemic heart disease, and her pulmonary edema and oxygen requirement remain. Net -1.6 L and +1 lb today. -- Cardiology consulted, appreciate recs -- Follow right and left heart cath results today -- IV Lasix 40mg  BID -- Strict I/Os and daily weights -- Telemetry and continuous pulse ox  Chest pain, resolved, atypical substernal and tender to palpation, intermittent and pleuritic. EKG with no ischemic changes. Initial troponin negative. Presented with oxygen requirement. No clinical signs DVT, no recent travel, surgery, malignancy. Wells Score 0. Serial troponins negative -- Tylenol PRN and Voltaren gel QID for pain control  Prolonged QT, present on admit and above 600. Likely due to plethora of psychiatric medications. EKG  overnight showed normalized QT at 440 ms. Repleted magnesium IV on 3/13. -- Avoid all QT prolonging medications  Mild cellulitis of right antecubital previous IV site, new today, no systemic signs of illness, mild infection -- Apply warm compresses  Asthma, vs component undiagnosed COPD, 20+ pack year smoking history -- Duoneb neb Q6H PRN -- Pulmicort neb BID  Tobacco use - recently with previous 60 PYSH, congratulated on this -- Provided Nicoderm patch   Psychiatric illness, carries PMH Anxiety, Depression, Bipolar disorder -- home Clonazepam TID, Prozac, Imipramine, Prazosin, and Geodon TID  Seizure disorder -- home Lamictal, Trileptal, Topiramate  GERD -- Protonix Urge incontinence -- home Mirabegron QD HLD -- home Gemfibrozil BID Constipation  -- Miralax BID PRN  FEN/GI: NPO sip prior to cath today, replete electrolytes as needed  DVT ppx: Lovenox  Code status: Discussed and DNR/DNI per patient request  Dispo: Anticipated discharge in approximately 1-2 days.  LOS: 2 days   [name], MD 08/08/2016, 7:59 AM Pager: [phone]

## 2016-08-08 NOTE — Progress Notes (Signed)
Pt. Requesting mucinex and cough medicine for cough. On call for IMTS paged to make aware.

## 2016-08-08 NOTE — Plan of Care (Signed)
Problem: Activity: Goal: Capacity to carry out activities will improve Outcome: Not Met (add Reason) Pt. Continues to show signs of weakness with transfers and ambulating.   Problem: Cardiac: Goal: Ability to achieve and maintain adequate cardiopulmonary perfusion will improve Outcome: Progressing Cath scheduled for am

## 2016-08-08 NOTE — Interval H&P Note (Signed)
Cath Lab Visit (complete for each Cath Lab visit)  Clinical Evaluation Leading to the Procedure:   ACS: Yes.    Non-ACS:  n/a    History and Physical Interval Note:  08/08/2016 3:27 PM  [name]  has presented today for surgery, with the diagnosis of cp  The various methods of treatment have been discussed with the patient and family. After consideration of risks, benefits and other options for treatment, the patient has consented to  Procedure(s): Right/Left Heart Cath and Coronary Angiography (N/A) as a surgical intervention .  The patient's history has been reviewed, patient examined, no change in status, stable for surgery.  I have reviewed the patient's chart and labs.  Questions were answered to the patient's satisfaction.     Darlene Wilcox

## 2016-08-08 NOTE — Progress Notes (Signed)
RN was called to the room at patient request. Upon entering room, patient and her two sisters began raising their voices at RN in regards to patient's heart cath scheduled for today. RN explained to pt and her sisters that their had been emergencies in the cath lab and the schedule was behind. Pt states that "she has been lied to." RN told pt that she had not been lied to that we have no control over emergencies and assured her that RN would inform her as soon as it was time for her cath. Both the pt and her sisters continued to yell at RN stating that "there will be an emergency if she doesn't go for her surgery."  Patient and her sisters continued to insist that they had been lied to and insist that she have food and drink.  I told them that if she eats she would not be able to have procedure. They stated "well they can just reschedule it". I explained to them the importance of allowing the cath to be done today and to not eat and refuse for the cath to be done. This RN told patient that she could have a mouth swab to wet her mouth, at this time she was satisfied. RN told pt that she will be informed as soon as cath lab is ready.  Jobe Marker. Dalene Seltzer, RN

## 2016-08-08 NOTE — H&P (View-Only) (Signed)
Progress Note  Patient Name: Darlene Wilcox Date of Encounter: 08/08/2016  Primary Cardiologist: Johnsie Cancel  Subjective   Less dyspnea right antecubital area hurts and is erythematous previous iv sight   Inpatient Medications    Scheduled Meds: . aspirin  81 mg Oral Daily  . budesonide (PULMICORT) nebulizer solution  0.5 mg Nebulization BID  . clonazePAM  1 mg Oral TID  . diclofenac sodium  2 g Topical QID  . enoxaparin (LOVENOX) injection  40 mg Subcutaneous Q24H  . FLUoxetine  40 mg Oral Daily  . furosemide  40 mg Intravenous BID  . gabapentin  300 mg Oral TID  . gemfibrozil  600 mg Oral BID AC  . imipramine  100 mg Oral QHS  . lamoTRIgine  25 mg Oral BID  . mouth rinse  15 mL Mouth Rinse BID  . mirabegron ER  50 mg Oral Daily  . multivitamin with minerals  1 tablet Oral Daily  . nicotine  21 mg Transdermal Daily  . oxcarbazepine  600 mg Oral QHS  . pantoprazole  40 mg Oral Daily  . potassium chloride  40 mEq Oral BID  . prazosin  5 mg Oral QHS  . sodium chloride flush  3 mL Intravenous Q12H  . topiramate  200 mg Oral QHS  . ziprasidone  160 mg Oral TID   Continuous Infusions: . sodium chloride 50 mL/hr (08/08/16 0114)   PRN Meds: sodium chloride, acetaminophen **OR** acetaminophen, ipratropium-albuterol, polyethylene glycol powder, sodium chloride flush   Vital Signs    Vitals:   08/07/16 1154 08/07/16 2135 08/08/16 0603 08/08/16 0736  BP: 104/65 106/62 (!) 104/46   Pulse: 100 (!) 104 91 90  Resp: 20 20 20 16   Temp: 98.5 F (36.9 C) 98.7 F (37.1 C) 97.5 F (36.4 C)   TempSrc: Oral Oral Oral   SpO2: 90% 92% 92% 92%  Weight:   166 lb 8 oz (75.5 kg)   Height:        Intake/Output Summary (Last 24 hours) at 08/08/16 0936 Last data filed at 08/08/16 0833  Gross per 24 hour  Intake           448.33 ml  Output             2100 ml  Net         -1651.67 ml   Filed Weights   08/06/16 0510 08/07/16 0646 08/08/16 0603  Weight: 169 lb 4.8 oz (76.8 kg) 165  lb 9.1 oz (75.1 kg) 166 lb 8 oz (75.5 kg)    Telemetry    NSR 08/08/2016  - Personally Reviewed  ECG    ST rate 129 no acute ST changes  - Personally Reviewed  Physical Exam  BP (!) 104/46 (BP Location: Left Arm)   Pulse 90   Temp 97.5 F (36.4 C) (Oral)   Resp 16   Ht 5\' 2"  (1.575 m)   Wt 166 lb 8 oz (75.5 kg) Comment: scale c  SpO2 92%   BMI 30.45 kg/m   GEN: No acute distress.   Neck: No JVD Cardiac: RRR, no murmurs, rubs, or gallops.  Respiratory: cough and bronchitic lung sounds . GI: Soft, nontender, non-distended  MS: No edema; No deformity. Neuro:  Nonfocal  Psych: Normal affect  Erythema right anti cubital fossa from iv sight   Labs    Chemistry  Recent Labs Lab 08/06/16 0243 08/07/16 0508 08/08/16 0458  NA 140 138 135  K 3.3* 4.0 4.6  CL 103 102 98*  CO2 28 25 26   GLUCOSE 105* 109* 109*  BUN 13 18 18   CREATININE 0.86 0.91 0.83  CALCIUM 9.0 9.2 8.7*  GFRNONAA >60 >60 >60  GFRAA >60 >60 >60  ANIONGAP 9 11 11      Hematology  Recent Labs Lab 08/05/16 0847  WBC 9.8  RBC 4.40  HGB 12.1  HCT 37.0  MCV 84.1  MCH 27.5  MCHC 32.7  RDW 13.7  PLT 280    Cardiac Enzymes  Recent Labs Lab 08/05/16 1702 08/05/16 2112 08/06/16 0243  TROPONINI 0.03* <0.03 <0.03     Recent Labs Lab 08/05/16 0858  TROPIPOC 0.00     BNP  Recent Labs Lab 08/05/16 0847  BNP 46.0     DDimer No results for input(s): DDIMER in the last 168 hours.   Radiology    No results found.  Cardiac Studies   Echo : personally reviewed EF 40-45%   Patient Profile     56 y.o. female COPD admitted with dyspnea and new CHF Echo with EF 40-45%   Assessment & Plan    ASSESSMENT AND PLAN:   The patient was seen today by Dr Johnsie Cancel, the patient evaluated and the data reviewed.     Acute systolic CHF:  - unknown chronicity - needs to continue on diuretics, compliance emphasized - needs ACE or ARB and BB as well.  - With asthma exacerbation, would not  start BB now, may not be able to use at all.  - Asthma may be the reason she is not on ASA.  - unknown cause of LVD, right and left cath today   Principal Problem:    Volume overload - see above  Active Problems:   Bipolar disorder (HCC) - PTA was on Klonopin 1 mg tid, Prozac 40 mg qd, Neurontin 300 mg tid, Tofranil 100 mg qhs, Lamictal 25 mg bid, Trileptal 600 mg qhs, Topamax 200 mg qhs, Geodon 160 mg tid. - Meds adjusted to decrease QT interval    Prolonged QT interval - normalized related to psych meds     Chest pain - no hx exertional CP - CP started in the setting of significant respiratory distress and resolved with improvement in respiratory status, without any cardiac meds - ez neg MI - However, pt has LVD - right and left cath today   Cellulitis:  She has allergy to PCN will leave antibiotics up to primary service   Signed, Jenkins Rouge, MD  08/08/2016, 9:36 AM

## 2016-08-08 NOTE — Progress Notes (Signed)
Progress Note  Patient Name: Darlene Wilcox Date of Encounter: 08/08/2016  Primary Cardiologist: Johnsie Cancel  Subjective   Less dyspnea right antecubital area hurts and is erythematous previous iv sight   Inpatient Medications    Scheduled Meds: . aspirin  81 mg Oral Daily  . budesonide (PULMICORT) nebulizer solution  0.5 mg Nebulization BID  . clonazePAM  1 mg Oral TID  . diclofenac sodium  2 g Topical QID  . enoxaparin (LOVENOX) injection  40 mg Subcutaneous Q24H  . FLUoxetine  40 mg Oral Daily  . furosemide  40 mg Intravenous BID  . gabapentin  300 mg Oral TID  . gemfibrozil  600 mg Oral BID AC  . imipramine  100 mg Oral QHS  . lamoTRIgine  25 mg Oral BID  . mouth rinse  15 mL Mouth Rinse BID  . mirabegron ER  50 mg Oral Daily  . multivitamin with minerals  1 tablet Oral Daily  . nicotine  21 mg Transdermal Daily  . oxcarbazepine  600 mg Oral QHS  . pantoprazole  40 mg Oral Daily  . potassium chloride  40 mEq Oral BID  . prazosin  5 mg Oral QHS  . sodium chloride flush  3 mL Intravenous Q12H  . topiramate  200 mg Oral QHS  . ziprasidone  160 mg Oral TID   Continuous Infusions: . sodium chloride 50 mL/hr (08/08/16 0114)   PRN Meds: sodium chloride, acetaminophen **OR** acetaminophen, ipratropium-albuterol, polyethylene glycol powder, sodium chloride flush   Vital Signs    Vitals:   08/07/16 1154 08/07/16 2135 08/08/16 0603 08/08/16 0736  BP: 104/65 106/62 (!) 104/46   Pulse: 100 (!) 104 91 90  Resp: 20 20 20 16   Temp: 98.5 F (36.9 C) 98.7 F (37.1 C) 97.5 F (36.4 C)   TempSrc: Oral Oral Oral   SpO2: 90% 92% 92% 92%  Weight:   166 lb 8 oz (75.5 kg)   Height:        Intake/Output Summary (Last 24 hours) at 08/08/16 0936 Last data filed at 08/08/16 0833  Gross per 24 hour  Intake           448.33 ml  Output             2100 ml  Net         -1651.67 ml   Filed Weights   08/06/16 0510 08/07/16 0646 08/08/16 0603  Weight: 169 lb 4.8 oz (76.8 kg) 165  lb 9.1 oz (75.1 kg) 166 lb 8 oz (75.5 kg)    Telemetry    NSR 08/08/2016  - Personally Reviewed  ECG    ST rate 129 no acute ST changes  - Personally Reviewed  Physical Exam  BP (!) 104/46 (BP Location: Left Arm)   Pulse 90   Temp 97.5 F (36.4 C) (Oral)   Resp 16   Ht 5\' 2"  (1.575 m)   Wt 166 lb 8 oz (75.5 kg) Comment: scale c  SpO2 92%   BMI 30.45 kg/m   GEN: No acute distress.   Neck: No JVD Cardiac: RRR, no murmurs, rubs, or gallops.  Respiratory: cough and bronchitic lung sounds . GI: Soft, nontender, non-distended  MS: No edema; No deformity. Neuro:  Nonfocal  Psych: Normal affect  Erythema right anti cubital fossa from iv sight   Labs    Chemistry  Recent Labs Lab 08/06/16 0243 08/07/16 0508 08/08/16 0458  NA 140 138 135  K 3.3* 4.0 4.6  CL 103 102 98*  CO2 28 25 26   GLUCOSE 105* 109* 109*  BUN 13 18 18   CREATININE 0.86 0.91 0.83  CALCIUM 9.0 9.2 8.7*  GFRNONAA >60 >60 >60  GFRAA >60 >60 >60  ANIONGAP 9 11 11      Hematology  Recent Labs Lab 08/05/16 0847  WBC 9.8  RBC 4.40  HGB 12.1  HCT 37.0  MCV 84.1  MCH 27.5  MCHC 32.7  RDW 13.7  PLT 280    Cardiac Enzymes  Recent Labs Lab 08/05/16 1702 08/05/16 2112 08/06/16 0243  TROPONINI 0.03* <0.03 <0.03     Recent Labs Lab 08/05/16 0858  TROPIPOC 0.00     BNP  Recent Labs Lab 08/05/16 0847  BNP 46.0     DDimer No results for input(s): DDIMER in the last 168 hours.   Radiology    No results found.  Cardiac Studies   Echo : personally reviewed EF 40-45%   Patient Profile     56 y.o. female COPD admitted with dyspnea and new CHF Echo with EF 40-45%   Assessment & Plan    ASSESSMENT AND PLAN:   The patient was seen today by Dr Johnsie Cancel, the patient evaluated and the data reviewed.     Acute systolic CHF:  - unknown chronicity - needs to continue on diuretics, compliance emphasized - needs ACE or ARB and BB as well.  - With asthma exacerbation, would not  start BB now, may not be able to use at all.  - Asthma may be the reason she is not on ASA.  - unknown cause of LVD, right and left cath today   Principal Problem:    Volume overload - see above  Active Problems:   Bipolar disorder (HCC) - PTA was on Klonopin 1 mg tid, Prozac 40 mg qd, Neurontin 300 mg tid, Tofranil 100 mg qhs, Lamictal 25 mg bid, Trileptal 600 mg qhs, Topamax 200 mg qhs, Geodon 160 mg tid. - Meds adjusted to decrease QT interval    Prolonged QT interval - normalized related to psych meds     Chest pain - no hx exertional CP - CP started in the setting of significant respiratory distress and resolved with improvement in respiratory status, without any cardiac meds - ez neg MI - However, pt has LVD - right and left cath today   Cellulitis:  She has allergy to PCN will leave antibiotics up to primary service   Signed, Jenkins Rouge, MD  08/08/2016, 9:36 AM

## 2016-08-08 NOTE — Plan of Care (Signed)
Problem: Activity: Goal: Capacity to carry out activities will improve Outcome: Progressing Pt. Appears to have more energy today. Stated she feels stronger than she has been feeling over the last few days.   Problem: Education: Goal: Ability to demonstrate managment of disease process will improve Outcome: Progressing Pt. Educated about fluid intake and monitoring to prevent fluid overload.

## 2016-08-09 ENCOUNTER — Encounter (HOSPITAL_COMMUNITY): Payer: Self-pay | Admitting: Cardiovascular Disease

## 2016-08-09 DIAGNOSIS — Z880 Allergy status to penicillin: Secondary | ICD-10-CM

## 2016-08-09 DIAGNOSIS — I808 Phlebitis and thrombophlebitis of other sites: Secondary | ICD-10-CM

## 2016-08-09 DIAGNOSIS — Z91018 Allergy to other foods: Secondary | ICD-10-CM

## 2016-08-09 DIAGNOSIS — Z91030 Bee allergy status: Secondary | ICD-10-CM

## 2016-08-09 LAB — PROTIME-INR
INR: 0.97
Prothrombin Time: 12.9 seconds (ref 11.4–15.2)

## 2016-08-09 MED ORDER — METOPROLOL SUCCINATE ER 25 MG PO TB24: 12.5000 mg | ORAL_TABLET | Freq: Every day | ORAL | 2 refills | Status: DC

## 2016-08-09 MED ORDER — NICOTINE 21 MG/24HR TD PT24: 21.0000 mg | MEDICATED_PATCH | Freq: Every day | TRANSDERMAL | 2 refills | Status: DC

## 2016-08-09 MED ORDER — LOSARTAN POTASSIUM 25 MG PO TABS: 25.0000 mg | ORAL_TABLET | Freq: Every day | ORAL | 2 refills | Status: DC

## 2016-08-09 MED ORDER — LISINOPRIL 5 MG PO TABS: 5.0000 mg | ORAL_TABLET | Freq: Every day | ORAL | Status: DC

## 2016-08-09 MED ORDER — FUROSEMIDE 40 MG PO TABS: 20.0000 mg | ORAL_TABLET | Freq: Every day | ORAL | 4 refills | Status: DC

## 2016-08-09 MED ORDER — METOPROLOL SUCCINATE ER 25 MG PO TB24
12.5000 mg | ORAL_TABLET | Freq: Every day | ORAL | Status: DC
  Administered 2016-08-09: 12.5 mg via ORAL
  Filled 2016-08-09: qty 1

## 2016-08-09 MED ORDER — DICLOFENAC SODIUM 1 % TD GEL: 2.0000 g | Freq: Four times a day (QID) | TRANSDERMAL | 1 refills | Status: DC

## 2016-08-09 MED ORDER — LOSARTAN POTASSIUM 25 MG PO TABS
25.0000 mg | ORAL_TABLET | Freq: Every day | ORAL | Status: DC
  Administered 2016-08-09: 25 mg via ORAL
  Filled 2016-08-09: qty 1

## 2016-08-09 MED ORDER — POTASSIUM CHLORIDE CRYS ER 20 MEQ PO TBCR: 20.0000 meq | EXTENDED_RELEASE_TABLET | Freq: Every day | ORAL | 1 refills | Status: DC

## 2016-08-09 NOTE — Progress Notes (Signed)
Pt is alert and oriented taking a bath, off central monitoring and d/c by MD, plan to discharge today.

## 2016-08-09 NOTE — Discharge Summary (Signed)
Name: Darlene Wilcox MRN: [id number] DOB: 1961-04-15 56 y.o. PCP: Claretta Fraise, MD  Date of Admission: 08/05/2016  8:26 AM Date of Discharge: 08/09/2016 Attending Physician: Axel Filler, MD  Discharge Diagnosis: 1. Acute systolic CHF 2. Acute hypoxemic respiratory failure 3. Chest pain 4. Prolonged QT 5. Superficial thrombophlebitis 6. Tobacco use  Principal Problem:   Acute systolic CHF (congestive heart failure) (HCC) Active Problems:   Bipolar disorder (HCC)   DISORDER, TOBACCO USE   Seizures (HCC)   Asthma, chronic   Volume overload   Chest pain   Prolonged QT interval   Acute congestive heart failure ([place])   Acute hypoxemic respiratory failure (HCC)   Superficial thrombophlebitis   Discharge Medications: Allergies as of 08/09/2016      Reactions   Apple Shortness Of Breath   Penicillins Shortness Of Breath, Itching, Swelling   Has patient had a PCN reaction causing immediate rash, facial/tongue/throat swelling, SOB or lightheadedness with hypotension: Unknown, childhood reaction Has patient had a PCN reaction causing severe rash involving mucus membranes or skin necrosis: No Has patient had a PCN reaction that required hospitalization No Has patient had a PCN reaction occurring within the last 10 years: No If all of the above answers are "NO", then may proceed with Cephalosporin use.   Bee Venom Swelling   Takes benadryl to help with reaction      Medication List    TAKE these medications   clonazePAM 1 MG tablet Commonly known as:  KLONOPIN Take 1 tablet (1 mg total) by mouth 3 (three) times daily.   diclofenac sodium 1 % Gel Commonly known as:  VOLTAREN Apply 2 g topically 4 (four) times daily.   esomeprazole 40 MG capsule Commonly known as:  NEXIUM TAKE ONE CAPSULE BY MOUTH EVERY DAY   FLUoxetine 40 MG capsule Commonly known as:  PROZAC Take 40 mg by mouth daily.   fluticasone 110 MCG/ACT inhaler Commonly known as:  FLOVENT  HFA Inhale 2 puffs into the lungs 2 (two) times daily.   furosemide 40 MG tablet Commonly known as:  LASIX Take 0.5 tablets (20 mg total) by mouth daily. What changed:  See the new instructions.   gabapentin 300 MG capsule Commonly known as:  NEURONTIN Take 300 mg by mouth 3 (three) times daily.   gemfibrozil 600 MG tablet Commonly known as:  LOPID Take 1 tablet (600 mg total) by mouth 2 (two) times daily before a meal.   imipramine 50 MG tablet Commonly known as:  TOFRANIL TAKE 2 TABLETS BY MOUTH AT BEDTIME   lamoTRIgine 25 MG tablet Commonly known as:  LAMICTAL Take 25 mg by mouth 2 (two) times daily.   losartan 25 MG tablet Commonly known as:  COZAAR Take 1 tablet (25 mg total) by mouth daily. Start taking on:  08/10/2016   metoprolol succinate 25 MG 24 hr tablet Commonly known as:  TOPROL-XL Take 0.5 tablets (12.5 mg total) by mouth daily. Start taking on:  08/10/2016   multivitamin with minerals Tabs tablet Take 1 tablet by mouth daily.   MYRBETRIQ 50 MG Tb24 tablet Generic drug:  mirabegron ER TAKE 1 TABLET (50 MG TOTAL) BY MOUTH DAILY. FOR BLADDER   naproxen 500 MG tablet Commonly known as:  NAPROSYN TAKE 1 TABLET BY MOUTH TWICE A DAY WITH A MEAL   nicotine 21 mg/24hr patch Commonly known as:  NICODERM CQ - dosed in mg/24 hours Place 1 patch (21 mg total) onto the skin daily. Start taking  on:  08/10/2016   oxcarbazepine 600 MG tablet Commonly known as:  TRILEPTAL Take 600 mg by mouth at bedtime.   polyethylene glycol powder powder Commonly known as:  GLYCOLAX/MIRALAX Take 17 g by mouth 2 (two) times daily as needed for moderate constipation. What changed:  when to take this   potassium chloride SA 20 MEQ tablet Commonly known as:  K-DUR,KLOR-CON Take 1 tablet (20 mEq total) by mouth daily.   prazosin 5 MG capsule Commonly known as:  MINIPRESS Take 5 mg by mouth at bedtime.   PROAIR HFA 108 (90 Base) MCG/ACT inhaler Generic drug:   albuterol INHALE 2 PUFFS INTO THE LUNGS EVERY 4 (FOUR) HOURS AS NEEDED FOR WHEEZING OR SHORTNESS OF BREATH.   topiramate 200 MG tablet Commonly known as:  TOPAMAX Take 200 mg by mouth at bedtime.   ziprasidone 80 MG capsule Commonly known as:  GEODON Take 160 mg by mouth 3 (three) times daily.       Disposition and follow-up:   Darlene Wilcox was discharged from [facility] in Stable condition.  At the hospital follow up visit please address:  1. Acute systolic CHF - assess volume status, symptoms of CHF, adherence with Lasix daily  Acute hypoxemic respiratory failure - assess respiratory status and exercise tolerance  Chest pain - assess for recurrence  Prolonged QT - assess for recurrence of QT prolongation  Superficial thrombophlebitis - assess right antecubital fossa skin for resolution or worsening erythema  Tobacco use - quit prior to hospital stay, assess continued cessation and use of nicotine patches  2.  Labs / imaging needed at time of follow-up: BMP, Mg, EKG  3.  Pending labs/ test needing follow-up: None  Follow-up Appointments: Follow-up Information    STACKS,WARREN, MD. Go on 08/14/2016.   Specialty:  Family Medicine Why:  Hospital follow up visit at 9:25 AM, please arrive 15 minutes early to check in Contact information: [place] [id number] [phone]           Hospital Course by problem list: Principal Problem:   Acute systolic CHF (congestive heart failure) (HCC) Active Problems:   Bipolar disorder (HCC)   DISORDER, TOBACCO USE   Seizures (HCC)   Asthma, chronic   Volume overload   Chest pain   Prolonged QT interval   Acute congestive heart failure ([place])   Acute hypoxemic respiratory failure (HCC)   Superficial thrombophlebitis   1. Acute systolic CHF Darlene Wilcox is a 56 y.o. woman with PMH asthma, bipolar disorder, chronic back pain, tobacco use, and GERD who presented to the ED on 3/11 for cough,  chest pain, and dyspnea on exertion. She also reporte  4-pillow orthopnea and noncompliance with prescribed Lasix for the past week. She was hypoxic requiring supplemental O2. CXR revealed pulmonary congestion consistent with CHF and she was started on scheduled IV diuretics. EKG showed no ischemic changes and serial troponins were negative. TTE on 3/12 revealed LVEF 40-45% and diffuse hypokinesis consistent with acute systolic CHF. Cardiology was consulted and patient was schedule for ischemic evaluation. LHC on 3/14 revealed no significant CAD, mild to moderated LV systolic dysfunction, and a normalized LVEDP. Her diuretics were reduced to Lasix po daily and Metoprolol succinate and Losartan were started for CHF. She tolerated these medications and she was deemed stable for discharge on 3/15 with PCP and cardiology follow up.  2. Acute hypoxemic respiratory failure - on presentation was found to be hypoxic to lows 80s% on  room air, requiring supplement oxygen 2L via nasal cannula. CXR revealed changes consistent with CHF and pulmonary edema. Her home Pulmicort and prn Albuterol were continued during hospitalization. She was started on scheduled IV diuretics to resolve this. She remained hypoxic on room air with ambulation on 3/12 and 3/13. We had achieved adequate diuresis by 3/14, as left heart cath revealed normal LVEDP. She was ambulating and maintaining SpO2 in high 90s without supplemental oxygen and deemed stable for discharge by 3/15.  3. Chest pain - presented with pleuritic chest pain, atypical and chest wall tender to palpation, no clinical signs of DVT. Initial and repeat EKG showed no ischemic changes and serial troponins were negative. Tylenol and diclofenac gel PRN improved her chest pain and supplementary oxygen and diuresis improved her respiratory status. She reported resolution of her chest pain by day after admission (3/12). Given smoking history, obesity, numerous psych meds concerning for  metabolic syndrome, ischemic eval continued. TTE revealed global hypokinesis and LVEF 40-45%. LHC on 3/14 revealed normal coronaries and no significant CAD.   4. Prolonged QT - on presentation EKG (3/11) was concerning for QT over 500 ms, attributed to the 8 different psychotropic medications patient is prescribed for bipolar disorder and seizures. Magnesium was checked and found to be borderline low at 1.7, repleted with IV Magnesium Sulfate to 2.3. Her psychoactive medications were continued during hospitalization and repeat EKGs revealed normalization of her QT interval. Addition of any potential QT-prolonging medications was avoided during hospitalization.   5. Superficial thrombophlebitis - developed skin erythema at right antecubital fossa site of previous IV during hospitalization was was mildly tender on 3/14, provided warm compresses and it improved but remained on day of discharge.  6. Tobacco use - reported previously smoking 1 ppd for at least 20 years, quit week prior to presentation and very motivated to maintain cessation. Provided Nicoderm patches while hospitalized and prescription for these on discharge.   Discharge Vitals:   BP (!) 117/57 (BP Location: Right Arm)   Pulse 92   Temp 97.5 F (36.4 C) (Oral)   Resp 18   Ht 5\' 2"  (1.575 m)   Wt 169 lb (76.7 kg) Comment: c scale  SpO2 96%   BMI 30.91 kg/m   Pertinent Labs, Studies, and Procedures:  BMP Latest Ref Rng & Units 08/08/2016 08/07/2016 08/06/2016  Glucose 65 - 99 mg/dL 109(H) 109(H) 105(H)  BUN 6 - 20 mg/dL 18 18 13   Creatinine 0.44 - 1.00 mg/dL 0.83 0.91 0.86  BUN/Creat Ratio 9 - 23 - - -  Sodium 135 - 145 mmol/L 135 138 140  Potassium 3.5 - 5.1 mmol/L 4.6 4.0 3.3(L)  Chloride 101 - 111 mmol/L 98(L) 102 103  CO2 22 - 32 mmol/L 26 25 28   Calcium 8.9 - 10.3 mg/dL 8.7(L) 9.2 9.0     Recent Labs Lab 08/05/16 1702 08/05/16 2112 08/06/16 0243  TROPONINI 0.03* <0.03 <0.03   Dg Chest 2 View  Result Date:  08/05/2016 CLINICAL DATA:  Shortness of breath and chest pain for 5 days EXAM: CHEST  2 VIEW COMPARISON:  06/01/2016 FINDINGS: Cardiac shadow is mildly enlarged. Vascular congestion and mild interstitial edema is noted consistent with CHF. No sizable effusion is noted. No focal infiltrate is seen. IMPRESSION: Changes of mild CHF. Electronically Signed   By: Inez Catalina M.D.   On: 08/05/2016 09:22   TTE 08/06/16 Study Conclusions - Left ventricle: The cavity size was normal. Wall thickness was   normal. Systolic function was  mildly to moderately reduced. The   estimated ejection fraction was in the range of 40% to 45%.   Diffuse hypokinesis. - Mitral valve: There was mild regurgitation. - Pulmonary arteries: Systolic pressure was mildly increased. PA   peak pressure: 31 mm Hg (S).  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  LV end diastolic pressure is normal.  There is mild to moderate left ventricular systolic dysfunction.  There is no mitral valve regurgitation.   1. Near normal coronary arteries with no evidence of obstructive disease. 2. Mild to moderately reduced LV systolic function with an ejection fraction of 40% with global hypokinesis. 3. Normal left ventricular end-diastolic pressure.    Discharge Instructions: Discharge Instructions    (HEART FAILURE PATIENTS) Call MD:  Anytime you have any of the following symptoms: 1) 3 pound weight gain in 24 hours or 5 pounds in 1 week 2) shortness of breath, with or without a dry hacking cough 3) swelling in the hands, feet or stomach 4) if you have to sleep on extra pillows at night in order to breathe.    Complete by:  As directed    Diet - low sodium heart healthy    Complete by:  As directed    Discharge instructions    Complete by:  As directed    We have found that you have mild to moderate heart failure. This resulted in buildup of fluid in your lungs, which caused your  difficulty breathing.   It is very important that you take your fluid pill Lasix, half a tablet or 20 mg daily. You should also check and keep track of your weight first thing in the morning each day. If you notice weight gain of 3 lbs in one day or 5 lbs over a week - take a full tablet that day. You should also call your PCP to see if you need to take more.   Take potassium tablets daily with this fluid pill will keep your potassium from getting too low.  It is also very important in heart failure to minimize eating salty foods, or drinking excess amounts of liquids.   To keep your heart failure from worsening please take two new medications: - Metoprolol succinate (Toprol XL) 12.5 mg once daily - Losartan 25 mg once daily - If you notice increased problems with dizziness or low blood pressure, please stop taking the Losartan, and visit your doctor  We have prescribed diclofenac gel (Voltaren) to apply to your chest wall pain, or any aching joints with arthritis.  We have prescribed Nicotine patches to use daily to help with smoking cessation. Please keep up the good work and avoid smoking in the future - as this will increase your risk for heart attack, COPD, stroke, cancer, and many other serious conditions.   You have some minor irritation of the skin of your right arm at a previous IV site. You can apply warm compresses (towel) to this spot several times daily and it should self resolve. If it appears to get worse or becomes very painful please visit your doctor or an urgent care.  Please follow up with your primary care doctor in two weeks and the heart doctors as well.   Increase activity slowly    Complete by:  As directed       Signed: [name], MD 08/09/2016, 1:58 PM   Pager: [phone]

## 2016-08-09 NOTE — Progress Notes (Signed)
Progress Note  Patient Name: Darlene Wilcox Date of Encounter: 08/09/2016  Primary Cardiologist: Johnsie Cancel  Subjective   Wants to go home right antecubital fossa less tender   Inpatient Medications    Scheduled Meds: . aspirin  81 mg Oral Daily  . budesonide (PULMICORT) nebulizer solution  0.5 mg Nebulization BID  . clonazePAM  1 mg Oral TID  . dextromethorphan-guaiFENesin  1 tablet Oral BID  . diclofenac sodium  2 g Topical QID  . enoxaparin (LOVENOX) injection  40 mg Subcutaneous Q24H  . FLUoxetine  40 mg Oral Daily  . furosemide  20 mg Oral Daily  . gabapentin  300 mg Oral TID  . gemfibrozil  600 mg Oral BID AC  . imipramine  100 mg Oral QHS  . lamoTRIgine  25 mg Oral BID  . mouth rinse  15 mL Mouth Rinse BID  . metoprolol succinate  12.5 mg Oral Daily  . mirabegron ER  50 mg Oral Daily  . multivitamin with minerals  1 tablet Oral Daily  . nicotine  21 mg Transdermal Daily  . oxcarbazepine  600 mg Oral QHS  . pantoprazole  40 mg Oral Daily  . potassium chloride  40 mEq Oral BID  . prazosin  5 mg Oral QHS  . sodium chloride flush  3 mL Intravenous Q12H  . topiramate  200 mg Oral QHS  . ziprasidone  160 mg Oral TID   Continuous Infusions:  PRN Meds: sodium chloride, acetaminophen **OR** acetaminophen, ipratropium-albuterol, polyethylene glycol powder, sodium chloride flush   Vital Signs    Vitals:   08/08/16 2020 08/08/16 2052 08/09/16 0704 08/09/16 0752  BP: (!) 111/52  111/76 (!) 117/57  Pulse: 98 87 86   Resp: (!) 22 18 18 16   Temp: 98.4 F (36.9 C)  97.5 F (36.4 C)   TempSrc: Oral  Oral   SpO2: 92% 93% 96% 95%  Weight:   169 lb (76.7 kg)   Height:        Intake/Output Summary (Last 24 hours) at 08/09/16 0914 Last data filed at 08/09/16 0706  Gross per 24 hour  Intake              720 ml  Output             2825 ml  Net            -2105 ml   Filed Weights   08/07/16 0646 08/08/16 0603 08/09/16 0704  Weight: 165 lb 9.1 oz (75.1 kg) 166 lb 8  oz (75.5 kg) 169 lb (76.7 kg)    Telemetry    NSR 08/09/2016  - Personally Reviewed  ECG    ST rate 129 no acute ST changes  - Personally Reviewed  Physical Exam  BP (!) 117/57 (BP Location: Right Arm)   Pulse 86   Temp 97.5 F (36.4 C) (Oral)   Resp 16   Ht 5\' 2"  (1.575 m)   Wt 169 lb (76.7 kg) Comment: c scale  SpO2 95%   BMI 30.91 kg/m   GEN: No acute distress.   Neck: No JVD Cardiac: RRR, no murmurs, rubs, or gallops.  Respiratory: cough and bronchitic lung sounds . GI: Soft, nontender, non-distended  MS: No edema; No deformity. Neuro:  Nonfocal  Psych: Normal affect  Erythema right anti cubital fossa better  from iv sight  Right radial cath sight A   Labs    Chemistry  Recent Labs Lab 08/06/16 0243 08/07/16 7425  08/08/16 0458  NA 140 138 135  K 3.3* 4.0 4.6  CL 103 102 98*  CO2 28 25 26   GLUCOSE 105* 109* 109*  BUN 13 18 18   CREATININE 0.86 0.91 0.83  CALCIUM 9.0 9.2 8.7*  GFRNONAA >60 >60 >60  GFRAA >60 >60 >60  ANIONGAP 9 11 11      Hematology  Recent Labs Lab 08/05/16 0847  WBC 9.8  RBC 4.40  HGB 12.1  HCT 37.0  MCV 84.1  MCH 27.5  MCHC 32.7  RDW 13.7  PLT 280    Cardiac Enzymes  Recent Labs Lab 08/05/16 1702 08/05/16 2112 08/06/16 0243  TROPONINI 0.03* <0.03 <0.03     Recent Labs Lab 08/05/16 0858  TROPIPOC 0.00     BNP  Recent Labs Lab 08/05/16 0847  BNP 46.0     DDimer No results for input(s): DDIMER in the last 168 hours.   Radiology    No results found.  Cardiac Studies   Echo : personally reviewed EF 40-45%   Patient Profile     56 y.o. female COPD admitted with dyspnea and new CHF Echo with EF 40-45%   Assessment & Plan    ASSESSMENT AND PLAN:   The patient was seen today by Dr Johnsie Cancel, the patient evaluated and the data reviewed.   Acute systolic CHF:   Resolved non ischemic DCM continue medical Rx ok to d/c home today   Bipolar disorder  PTA was on Klonopin 1 mg tid, Prozac 40 mg qd,  Neurontin 300 mg tid, Tofranil 100 mg qhs, Lamictal 25 mg bid, Trileptal 600 mg qhs, Topamax 200 mg qhs, Geodon 160 mg tid.  Meds adjusted to decrease QT interval  Prolonged QT interval  normalized related to psych meds   Chest pain   no CAD by cath non cardiac improved     Cellulitis:  She has allergy to PCN will leave antibiotics up to primary service   Signed, Jenkins Rouge, MD  08/09/2016, 9:14 AM

## 2016-08-09 NOTE — Progress Notes (Signed)
SATURATION QUALIFICATIONS: (This note is used to comply with regulatory documentation for home oxygen)  Patient Saturations on Room Air at Rest = 97%  Patient Saturations on Room Air while Ambulating = 98%  Patient Saturations on  Liters of oxygen while Ambulating = %  Please briefly explain why patient needs home oxygen:no o2 needed

## 2016-08-09 NOTE — Discharge Instructions (Signed)
We have found that you have mild to moderate heart failure. This resulted in buildup of fluid in your lungs, which caused your difficulty breathing.   It is very important that you take your fluid pill Lasix, half a tablet or 20 mg daily. You should also check and keep track of your weight first thing in the morning each day. If you notice weight gain of 3 lbs in one day or 5 lbs over a week - take a full tablet that day. You should also call your PCP to see if you need to take more.   Take potassium tablets daily with this fluid pill will keep your potassium from getting too low.  It is also very important in heart failure to minimize eating salty foods, or drinking excess amounts of liquids.   To keep your heart failure from worsening please take two new medications: - Metoprolol succinate (Toprol XL) 12.5 mg once daily - Losartan 25 mg once daily - If you notice increased problems with dizziness or low blood pressure, please stop taking the Losartan, and visit your doctor  We have prescribed diclofenac gel (Voltaren) to apply to your chest wall pain, or any aching joints with arthritis.  We have prescribed Nicotine patches to use daily to help with smoking cessation. Please keep up the good work and avoid smoking in the future - as this will increase your risk for heart attack, COPD, stroke, cancer, and many other serious conditions.   You have some minor irritation of the skin of your right arm at a previous IV site. You can apply warm compresses (towel) to this spot several times daily and it should self resolve. If it appears to get worse or becomes very painful please visit your doctor or an urgent care.  Please follow up with your primary care doctor in two weeks and the heart doctors as well.  ----- -----   Heart Failure Heart failure means your heart has trouble pumping blood. This makes it hard for your body to work well. Heart failure is usually a long-term (chronic) condition.  You must take good care of yourself and follow your doctor's treatment plan. Follow these instructions at home:  Take your heart medicine as told by your doctor.  Do not stop taking medicine unless your doctor tells you to.  Do not skip any dose of medicine.  Refill your medicines before they run out.  Take other medicines only as told by your doctor or pharmacist.  Stay active if told by your doctor. The elderly and people with severe heart failure should talk with a doctor about physical activity.  Eat heart-healthy foods. Choose foods that are without trans fat and are low in saturated fat, cholesterol, and salt (sodium). This includes fresh or frozen fruits and vegetables, fish, lean meats, fat-free or low-fat dairy foods, whole grains, and high-fiber foods. Lentils and dried peas and beans (legumes) are also good choices.  Limit salt if told by your doctor.  Cook in a healthy way. Roast, grill, broil, bake, poach, steam, or stir-fry foods.  Limit fluids as told by your doctor.  Weigh yourself every morning. Do this after you pee (urinate) and before you eat breakfast. Write down your weight to give to your doctor.  Take your blood pressure and write it down if your doctor tells you to.  Ask your doctor how to check your pulse. Check your pulse as told.  Lose weight if told by your doctor.  Stop smoking  or chewing tobacco. Do not use gum or patches that help you quit without your doctor's approval.  Schedule and go to doctor visits as told.  Nonpregnant women should have no more than 1 drink a day. Men should have no more than 2 drinks a day. Talk to your doctor about drinking alcohol.  Stop illegal drug use.  Stay current with shots (immunizations).  Manage your health conditions as told by your doctor.  Learn to manage your stress.  Rest when you are tired.  If it is really hot outside:  Avoid intense activities.  Use air conditioning or fans, or get in a  cooler place.  Avoid caffeine and alcohol.  Wear loose-fitting, lightweight, and light-colored clothing.  If it is really cold outside:  Avoid intense activities.  Layer your clothing.  Wear mittens or gloves, a hat, and a scarf when going outside.  Avoid alcohol.  Learn about heart failure and get support as needed.  Get help to maintain or improve your quality of life and your ability to care for yourself as needed. Contact a doctor if:  You gain weight quickly.  You are more short of breath than usual.  You cannot do your normal activities.  You tire easily.  You cough more than normal, especially with activity.  You have any or more puffiness (swelling) in areas such as your hands, feet, ankles, or belly (abdomen).  You cannot sleep because it is hard to breathe.  You feel like your heart is beating fast (palpitations).  You get dizzy or light-headed when you stand up. Get help right away if:  You have trouble breathing.  There is a change in mental status, such as becoming less alert or not being able to focus.  You have chest pain or discomfort.  You faint. This information is not intended to replace advice given to you by your health care provider. Make sure you discuss any questions you have with your health care provider. Document Released: 02/21/2008 Document Revised: 10/20/2015 Document Reviewed: 06/30/2012 Elsevier Interactive Patient Education  2017 [place] DASH stands for "Dietary Approaches to Stop Hypertension." The DASH eating plan is a healthy eating plan that has been shown to reduce high blood pressure (hypertension). It may also reduce your risk for type 2 diabetes, heart disease, and stroke. The DASH eating plan may also help with weight loss. What are tips for following this plan? General guidelines   Avoid eating more than 2,300 mg (milligrams) of salt (sodium) a day. If you have hypertension, you may need to  reduce your sodium intake to 1,500 mg a day.  Limit alcohol intake to no more than 1 drink a day for nonpregnant women and 2 drinks a day for men. One drink equals 12 oz of beer, 5 oz of wine, or 1 oz of hard liquor.  Work with your health care provider to maintain a healthy body weight or to lose weight. Ask what an ideal weight is for you.  Get at least 30 minutes of exercise that causes your heart to beat faster (aerobic exercise) most days of the week. Activities may include walking, swimming, or biking.  Work with your health care provider or diet and nutrition specialist (dietitian) to adjust your eating plan to your individual calorie needs. Reading food labels   Check food labels for the amount of sodium per serving. Choose foods with less than 5 percent of the Daily Value of sodium. Generally, foods  with less than 300 mg of sodium per serving fit into this eating plan.  To find whole grains, look for the word "whole" as the first word in the ingredient list. Shopping   Buy products labeled as "low-sodium" or "no salt added."  Buy fresh foods. Avoid canned foods and premade or frozen meals. Cooking   Avoid adding salt when cooking. Use salt-free seasonings or herbs instead of table salt or sea salt. Check with your health care provider or pharmacist before using salt substitutes.  Do not fry foods. Cook foods using healthy methods such as baking, boiling, grilling, and broiling instead.  Cook with heart-healthy oils, such as olive, canola, soybean, or sunflower oil. Meal planning    Eat a balanced diet that includes:  5 or more servings of fruits and vegetables each day. At each meal, try to fill half of your plate with fruits and vegetables.  Up to 6-8 servings of whole grains each day.  Less than 6 oz of lean meat, poultry, or fish each day. A 3-oz serving of meat is about the same size as a deck of cards. One egg equals 1 oz.  2 servings of low-fat dairy each  day.  A serving of nuts, seeds, or beans 5 times each week.  Heart-healthy fats. Healthy fats called Omega-3 fatty acids are found in foods such as flaxseeds and coldwater fish, like sardines, salmon, and mackerel.  Limit how much you eat of the following:  Canned or prepackaged foods.  Food that is high in trans fat, such as fried foods.  Food that is high in saturated fat, such as fatty meat.  Sweets, desserts, sugary drinks, and other foods with added sugar.  Full-fat dairy products.  Do not salt foods before eating.  Try to eat at least 2 vegetarian meals each week.  Eat more home-cooked food and less restaurant, buffet, and fast food.  When eating at a restaurant, ask that your food be prepared with less salt or no salt, if possible. What foods are recommended? The items listed may not be a complete list. Talk with your dietitian about what dietary choices are best for you. Grains  Whole-grain or whole-wheat bread. Whole-grain or whole-wheat pasta. Brown rice. [place]. Bulgur. Whole-grain and low-sodium cereals. Pita bread. Low-fat, low-sodium crackers. Whole-wheat flour tortillas. Vegetables  Fresh or frozen vegetables (raw, steamed, roasted, or grilled). Low-sodium or reduced-sodium tomato and vegetable juice. Low-sodium or reduced-sodium tomato sauce and tomato paste. Low-sodium or reduced-sodium canned vegetables. Fruits  All fresh, dried, or frozen fruit. Canned fruit in natural juice (without added sugar). Meat and other protein foods  Skinless chicken or [place]. Ground chicken or [place]. Pork with fat trimmed off. Fish and seafood. Egg whites. Dried beans, peas, or lentils. Unsalted nuts, nut butters, and seeds. Unsalted canned beans. Lean cuts of beef with fat trimmed off. Low-sodium, lean deli meat. Dairy  Low-fat (1%) or fat-free (skim) milk. Fat-free, low-fat, or reduced-fat cheeses. Nonfat, low-sodium ricotta or cottage cheese. Low-fat or nonfat yogurt.  Low-fat, low-sodium cheese. Fats and oils  Soft margarine without trans fats. Vegetable oil. Low-fat, reduced-fat, or light mayonnaise and salad dressings (reduced-sodium). Canola, safflower, olive, soybean, and sunflower oils. Avocado. Seasoning and other foods  Herbs. Spices. Seasoning mixes without salt. Unsalted popcorn and pretzels. Fat-free sweets. What foods are not recommended? The items listed may not be a complete list. Talk with your dietitian about what dietary choices are best for you. Grains  Baked goods made with fat,  such as croissants, muffins, or some breads. Dry pasta or rice meal packs. Vegetables  Creamed or fried vegetables. Vegetables in a cheese sauce. Regular canned vegetables (not low-sodium or reduced-sodium). Regular canned tomato sauce and paste (not low-sodium or reduced-sodium). Regular tomato and vegetable juice (not low-sodium or reduced-sodium). Angie Fava. Olives. Fruits  Canned fruit in a light or heavy syrup. Fried fruit. Fruit in cream or butter sauce. Meat and other protein foods  Fatty cuts of meat. Ribs. Fried meat. Berniece Salines. Sausage. Bologna and other processed lunch meats. Salami. Fatback. Hotdogs. Bratwurst. Salted nuts and seeds. Canned beans with added salt. Canned or smoked fish. Whole eggs or egg yolks. Chicken or [place] with skin. Dairy  Whole or 2% milk, cream, and half-and-half. Whole or full-fat cream cheese. Whole-fat or sweetened yogurt. Full-fat cheese. Nondairy creamers. Whipped toppings. Processed cheese and cheese spreads. Fats and oils  Butter. Stick margarine. Lard. Shortening. Ghee. Bacon fat. Tropical oils, such as coconut, palm kernel, or palm oil. Seasoning and other foods  Salted popcorn and pretzels. Onion salt, garlic salt, seasoned salt, table salt, and sea salt. Worcestershire sauce. Tartar sauce. Barbecue sauce. Teriyaki sauce. Soy sauce, including reduced-sodium. Steak sauce. Canned and packaged gravies. Fish sauce. Oyster sauce.  Cocktail sauce. Horseradish that you find on the shelf. Ketchup. Mustard. Meat flavorings and tenderizers. Bouillon cubes. Hot sauce and Tabasco sauce. Premade or packaged marinades. Premade or packaged taco seasonings. Relishes. Regular salad dressings. Where to find more information:  National Heart, Lung, and [place]: https://wilson-eaton.com/  American Heart Association: www.heart.org Summary  The DASH eating plan is a healthy eating plan that has been shown to reduce high blood pressure (hypertension). It may also reduce your risk for type 2 diabetes, heart disease, and stroke.  With the DASH eating plan, you should limit salt (sodium) intake to 2,300 mg a day. If you have hypertension, you may need to reduce your sodium intake to 1,500 mg a day.  When on the DASH eating plan, aim to eat more fresh fruits and vegetables, whole grains, lean proteins, low-fat dairy, and heart-healthy fats.  Work with your health care provider or diet and nutrition specialist (dietitian) to adjust your eating plan to your individual calorie needs. This information is not intended to replace advice given to you by your health care provider. Make sure you discuss any questions you have with your health care provider. Document Released: 05/03/2011 Document Revised: 05/07/2016 Document Reviewed: 05/07/2016 Elsevier Interactive Patient Education  2017 [employer].

## 2016-08-09 NOTE — Progress Notes (Signed)
Patient was counseled on her discharge medications, metoprolol, furosemide and nicotine replacement patches. She was counseled to monitor for signs of hypotension, dizziness, and fatigue while on the metoprolol. She was counseled that the furosemide will make her urinate more frequently, and to avoid going to the restroom at night, she should this medication in the morning. She was reminded to monitor her weight ever day. If she gains 3 pounds in one day or 5 pounds in one week, she needs to call her primary care doctor. She was also counseled on smoking cessation. She seemed really motivated to quit smoking, she was reminded about how to avoid triggers, and encouraged to continue to move in the right direction. She was counseled on the step down approach with the patches and that the patches should be removed and a new one applied every 24 hours. She was reminded that she should not have on two patches at one time.    Quincy Sheehan and Wille Glaser PharmD 2018 candidate [place]

## 2016-08-09 NOTE — Progress Notes (Signed)
Subjective: Darlene Wilcox is awake and in good spirits this morning. Endorses feeling somewhat weak but has not been up and out of bed much. Breathing comfortably on room air, ambulated and maintained sat 98% this morning. LHC showed no CAD yesterday. Clear for discharge this afternoon.  Net -2.1 L and +3 lb today. Tele reviewed, uneventful  Objective: Vital signs in last 24 hours: Vitals:   08/08/16 1828 08/08/16 2020 08/08/16 2052 08/09/16 0704  BP: (!) 95/56 (!) 111/52  111/76  Pulse: 87 98 87 86  Resp:  (!) 22 18 18   Temp:  98.4 F (36.9 C)  97.5 F (36.4 C)  TempSrc:  Oral  Oral  SpO2:  92% 93% 96%  Weight:    169 lb (76.7 kg)  Height:        Intake/Output Summary (Last 24 hours) at 08/09/16 0730 Last data filed at 08/09/16 0706  Gross per 24 hour  Intake              720 ml  Output             2825 ml  Net            -2105 ml   Filed Weights   08/07/16 0646 08/08/16 0603 08/09/16 0704  Weight: 165 lb 9.1 oz (75.1 kg) 166 lb 8 oz (75.5 kg) 169 lb (76.7 kg)   Physical Exam General appearance: Elderly woman sitting comfortably in bedside chair, in no distress, pleasant and conversational HENT: Normocephalic, moist mucous membranes Cardiovascular: Regular rate and rhythm, no murmurs, rubs, gallops Respiratory/Chest: Clear to auscultation bilaterally, breathing comfortably on room air Skin: Warm, dry, intact, small area of erythema and mild tenderness over right AC fossa site of previous IV Psych: Positive affect, clear speech  Labs / Imaging / Procedures: CBC Latest Ref Rng & Units 08/05/2016 07/19/2016 12/16/2015  WBC 4.0 - 10.5 K/uL 9.8 14.5(H) 11.9(H)  Hemoglobin 12.0 - 15.0 g/dL 12.1 14.2 -  Hematocrit 36.0 - 46.0 % 37.0 42.1 39.6  Platelets 150 - 400 K/uL 280 255 259   BMP Latest Ref Rng & Units 08/08/2016 08/07/2016 08/06/2016  Glucose 65 - 99 mg/dL 109(H) 109(H) 105(H)  BUN 6 - 20 mg/dL 18 18 13   Creatinine 0.44 - 1.00 mg/dL 0.83 0.91 0.86  BUN/Creat Ratio 9 -  23 - - -  Sodium 135 - 145 mmol/L 135 138 140  Potassium 3.5 - 5.1 mmol/L 4.6 4.0 3.3(L)  Chloride 101 - 111 mmol/L 98(L) 102 103  CO2 22 - 32 mmol/L 26 25 28   Calcium 8.9 - 10.3 mg/dL 8.7(L) 9.2 9.0   No results found.  Assessment/Plan: Darlene Wilcox is a 56 y.o. woman with PMH Asthma, Bipolar disorder, obesity, tobacco use admitted for acute systolic hear failure and hypoxia from pulmonary edema.   Principal Problem:   Volume overload Active Problems:   Bipolar disorder (HCC)   DISORDER, TOBACCO USE   Seizures (HCC)   Asthma, chronic   Chest pain   Prolonged QT interval   Acute systolic CHF (congestive heart failure) (HCC)   CHF exacerbation (HCC)  Acute systolic heart failure, resulting in pulmonary edema and hypoxemic respiratory failure. CXR revealed pulmonary vascular congestion and ongoing oxygen requirement. TTE showed LVEF 40-45% and diffuse hypokinesis. Multiple cardiac risk factors, smoking, obesity, metabolic syndrome from psych meds Lipids, TSH, A1c wnl. Her dyspnea and chest pain have resolved but her echo results are concerning for ischemic heart disease. LHC revealed no CAD but  systolic dysfunction and global hypokinesis. Patient asymptomatic with no oxygen requirement today. Have achieved adequate diuresis.  -- Cardiology consulted, appreciate recs -- Lasix 20 mg daily -- Strict I/Os and daily weights -- Telemetry and continuous pulse ox -- Start Metoprolol succinate 12.5 mg daily, vitals mostly low-normotensive with HR normal to elevated  Chest pain, resolved, atypical substernal and tender to palpation, intermittent and pleuritic. EKG with no ischemic changes. Initial troponin negative. Presented with oxygen requirement. No clinical signs DVT, no recent travel, surgery, malignancy. Wells Score 0. Serial troponins negative -- Tylenol PRN and Voltaren gel QID for pain control  Prolonged QT, present on admit and above 600. Likely due to plethora of psychiatric  medications. EKG overnight showed normalized QT at 440 ms. Repleted magnesium IV on 3/13. -- Avoid all QT prolonging medications  Superficial thrombophlebitis, right AC fossa / previous IV site, no systemic signs of illness, mild infection -- Apply warm compresses  Asthma, vs component undiagnosed COPD, 20+ pack year smoking history -- Duoneb neb Q6H PRN -- Pulmicort neb BID  Tobacco use - recently with previous 69 PYSH, congratulated on this -- Provided Nicoderm patch   Psychiatric illness, carries PMH Anxiety, Depression, Bipolar disorder -- home Clonazepam TID, Prozac, Imipramine, Prazosin, and Geodon TID  Seizure disorder -- home Lamictal, Trileptal, Topiramate  GERD -- Protonix Urge incontinence -- home Mirabegron QD HLD -- home Gemfibrozil BID Constipation  -- Miralax BID PRN  FEN/GI: NPO sip prior to cath today, replete electrolytes as needed  DVT ppx: Lovenox  Code status: Discussed and DNR/DNI per patient request  Dispo: Anticipated discharge today.  LOS: 3 days   Darlene Partridge, MD 08/09/2016, 7:30 AM Pager: [phone]

## 2016-08-09 NOTE — Progress Notes (Signed)
Internal Medicine Attending:   I saw and examined the patient. I reviewed the resident's note and I agree with the resident's findings and plan as documented in the resident's note.  

## 2016-08-09 NOTE — Progress Notes (Signed)
Transitions of Care Pharmacy Note  Plan:  Patient was educated by two pharmacy students regarding metoprolol, furosemide, and nicotine patches. Please see their note. I agree with their assessment and plan. --------------------------------------------- Darlene Wilcox is an 56 y.o. female who presents with a chief complaint of cough, chest pain. In anticipation of discharge, pharmacy has reviewed this patient's prior to admission medication history, as well as current inpatient medications listed per the [facility].  Current medication indications, dosing, frequency, and notable side effects reviewed with patient. patient verbalized understanding of current inpatient medication regimen and is aware that the After Visit Summary when presented, will represent the most accurate medication list at discharge.   Assessment: Understanding of regimen: good Understanding of indications: good Potential of compliance: good Barriers to Obtaining Medications: No  Patient instructed to contact inpatient pharmacy team with further questions or concerns if needed.      Thank you for allowing pharmacy to be a part of this patient's care.  Gwenlyn Perking, PharmD PGY1 Pharmacy Resident Pager: [phone] 08/09/2016 3:35 PM

## 2016-08-09 NOTE — Progress Notes (Signed)
Paged md about pt prescription for Lasix.

## 2016-08-12 DIAGNOSIS — J9601 Acute respiratory failure with hypoxia: Secondary | ICD-10-CM

## 2016-08-12 DIAGNOSIS — I809 Phlebitis and thrombophlebitis of unspecified site: Secondary | ICD-10-CM

## 2016-08-13 DIAGNOSIS — N819 Female genital prolapse, unspecified: Secondary | ICD-10-CM | POA: Diagnosis not present

## 2016-08-14 ENCOUNTER — Ambulatory Visit: Payer: Medicare Other | Admitting: Family Medicine

## 2016-08-15 ENCOUNTER — Ambulatory Visit (INDEPENDENT_AMBULATORY_CARE_PROVIDER_SITE_OTHER): Payer: Medicare Other | Admitting: Family Medicine

## 2016-08-15 ENCOUNTER — Ambulatory Visit (INDEPENDENT_AMBULATORY_CARE_PROVIDER_SITE_OTHER): Payer: Medicare Other

## 2016-08-15 ENCOUNTER — Encounter: Payer: Self-pay | Admitting: Family Medicine

## 2016-08-15 ENCOUNTER — Other Ambulatory Visit: Payer: Self-pay | Admitting: *Deleted

## 2016-08-15 VITALS — BP 102/64 | HR 73 | Temp 96.8°F | Ht 62.0 in | Wt 177.0 lb

## 2016-08-15 DIAGNOSIS — I509 Heart failure, unspecified: Secondary | ICD-10-CM

## 2016-08-15 MED ORDER — FUROSEMIDE 40 MG PO TABS: 40.0000 mg | ORAL_TABLET | Freq: Every day | ORAL | 4 refills | Status: DC

## 2016-08-15 MED ORDER — METOPROLOL SUCCINATE ER 25 MG PO TB24: 25.0000 mg | ORAL_TABLET | Freq: Every day | ORAL | 2 refills | Status: DC

## 2016-08-15 NOTE — Progress Notes (Signed)
Subjective:  Patient ID: Darlene Wilcox, female    DOB: 16-Aug-1960  Age: 56 y.o. MRN: [id number]  CC: Hospitalization Follow-up (pt here today for hospital follow up after being admitted for CHF)   HPI Darlene Wilcox presents for From hospitalization. She continues to take meds as at hospital discharge but has gained 7 pounds and started breathing harder. She has been able to decrease her cigarettes to one and a half cigarettes daily using the patch and the [place] sig. She denies any swelling in the feet and ankles. Energy level is fair. She does have not have paroxysmal nocturnal dyspnea. She does not have orthopnea. She is taking 6 different psychoactive medications and is in the care of psychiatry. For this her blood pressure has been found to be fairly low at times. However she denies dizziness or faintness and orthostasis.   History Darlene Wilcox has a past medical history of Anxiety; Asthma; Bipolar disorder ([place]); Carpal tunnel syndrome of right wrist (12/2012); Chronic back pain greater than 3 months duration; Depression; Full dentures; GERD (gastroesophageal reflux disease); Headache(784.0); History of ankle sprain; and Seizures ([place]).   She has a past surgical history that includes Cervical fusion; Tubal ligation; Abdominal hysterectomy; Inguinal hernia repair (Right); Bunionectomy (Left); Diagnostic laparoscopy (11/08/2006); Laparoscopic appendectomy (11/08/2006); Lumbar laminectomy (02/05/2007); Carpal tunnel release (Right, 01/13/2013); Appendectomy; Lumbar fusion (06/28/2014); and Left Heart Cath and Coronary Angiography (N/A, 08/08/2016).   Her family history includes Diabetes in her maternal grandmother; Lung cancer in her father.She reports that she has been smoking Cigarettes.  She has a 20.00 pack-year smoking history. She has never used smokeless tobacco. She reports that she does not drink alcohol or use drugs.    ROS Review of Systems  Constitutional: Negative for activity change,  appetite change and fever.  HENT: Negative for congestion, rhinorrhea and sore throat.   Eyes: Negative for visual disturbance.  Respiratory: Positive for shortness of breath. Negative for cough.   Cardiovascular: Negative for chest pain and palpitations.  Gastrointestinal: Negative for abdominal pain, diarrhea and nausea.  Genitourinary: Negative for dysuria.  Musculoskeletal: Negative for arthralgias and myalgias.    Objective:  BP 102/64 Comment: manual cuff  Pulse 73   Temp (!) 96.8 F (36 C) (Oral)   Ht 5\' 2"  (1.575 m)   Wt 177 lb (80.3 kg)   BMI 32.37 kg/m   BP Readings from Last 3 Encounters:  08/15/16 102/64  08/09/16 (!) 117/57  07/19/16 119/82    Wt Readings from Last 3 Encounters:  08/15/16 177 lb (80.3 kg)  08/09/16 169 lb (76.7 kg)  07/19/16 170 lb (77.1 kg)     Physical Exam  Constitutional: She is oriented to person, place, and time. She appears well-developed and well-nourished. No distress.  HENT:  Head: Normocephalic and atraumatic.  Eyes: Conjunctivae are normal. Pupils are equal, round, and reactive to light.  Neck: Normal range of motion. Neck supple. No thyromegaly present.  Cardiovascular: Normal rate, regular rhythm and normal heart sounds.   No murmur heard. Pulmonary/Chest: Effort normal and breath sounds normal. No respiratory distress. She has no wheezes. She has no rales.  Abdominal: Soft. Bowel sounds are normal. She exhibits no distension. There is no tenderness.  Musculoskeletal: Normal range of motion.  Lymphadenopathy:    She has no cervical adenopathy.  Neurological: She is alert and oriented to person, place, and time.  Skin: Skin is warm and dry.  Psychiatric: She has a normal mood and affect. Her behavior is normal.  Judgment and thought content normal.      Assessment & Plan:   Darlene Wilcox was seen today for hospitalization follow-up.  Diagnoses and all orders for this visit:  Congestive heart failure, unspecified congestive  heart failure chronicity, unspecified congestive heart failure type ([place]) -     DG Chest 2 View; Future  Other orders -     metoprolol succinate (TOPROL-XL) 25 MG 24 hr tablet; Take 1 tablet (25 mg total) by mouth daily. -     furosemide (LASIX) 40 MG tablet; Take 1 tablet (40 mg total) by mouth daily.       I have changed Darlene Wilcox's metoprolol succinate and furosemide. I am also having her maintain her oxcarbazepine, ziprasidone, multivitamin with minerals, topiramate, clonazePAM, lamoTRIgine, prazosin, gabapentin, PROAIR HFA, gemfibrozil, polyethylene glycol powder, fluticasone, MYRBETRIQ, FLUoxetine, naproxen, esomeprazole, imipramine, diclofenac sodium, losartan, nicotine, and potassium chloride SA.  Allergies as of 08/15/2016      Reactions   Apple Anaphylaxis, Shortness Of Breath   Penicillins Anaphylaxis, Shortness Of Breath, Itching, Swelling   Has patient had a PCN reaction causing immediate rash, facial/tongue/throat swelling, SOB or lightheadedness with hypotension: Unknown, childhood reaction Has patient had a PCN reaction causing severe rash involving mucus membranes or skin necrosis: No Has patient had a PCN reaction that required hospitalization No Has patient had a PCN reaction occurring within the last 10 years: No If all of the above answers are "NO", then may proceed with Cephalosporin use.   Bee Venom Swelling   Takes benadryl to help with reaction      Medication List       Accurate as of 08/15/16  6:00 PM. Always use your most recent med list.          clonazePAM 1 MG tablet Commonly known as:  KLONOPIN Take 1 tablet (1 mg total) by mouth 3 (three) times daily.   diclofenac sodium 1 % Gel Commonly known as:  VOLTAREN Apply 2 g topically 4 (four) times daily.   esomeprazole 40 MG capsule Commonly known as:  NEXIUM TAKE ONE CAPSULE BY MOUTH EVERY DAY   FLUoxetine 40 MG capsule Commonly known as:  PROZAC Take 40 mg by mouth daily.   fluticasone  110 MCG/ACT inhaler Commonly known as:  FLOVENT HFA Inhale 2 puffs into the lungs 2 (two) times daily.   furosemide 40 MG tablet Commonly known as:  LASIX Take 1 tablet (40 mg total) by mouth daily.   gabapentin 300 MG capsule Commonly known as:  NEURONTIN Take 300 mg by mouth 3 (three) times daily.   gemfibrozil 600 MG tablet Commonly known as:  LOPID Take 1 tablet (600 mg total) by mouth 2 (two) times daily before a meal.   imipramine 50 MG tablet Commonly known as:  TOFRANIL TAKE 2 TABLETS BY MOUTH AT BEDTIME   lamoTRIgine 25 MG tablet Commonly known as:  LAMICTAL Take 25 mg by mouth 2 (two) times daily.   losartan 25 MG tablet Commonly known as:  COZAAR Take 1 tablet (25 mg total) by mouth daily.   metoprolol succinate 25 MG 24 hr tablet Commonly known as:  TOPROL-XL Take 1 tablet (25 mg total) by mouth daily.   multivitamin with minerals Tabs tablet Take 1 tablet by mouth daily.   MYRBETRIQ 50 MG Tb24 tablet Generic drug:  mirabegron ER TAKE 1 TABLET (50 MG TOTAL) BY MOUTH DAILY. FOR BLADDER   naproxen 500 MG tablet Commonly known as:  NAPROSYN TAKE 1 TABLET BY MOUTH  TWICE A DAY WITH A MEAL   nicotine 21 mg/24hr patch Commonly known as:  NICODERM CQ - dosed in mg/24 hours Place 1 patch (21 mg total) onto the skin daily.   oxcarbazepine 600 MG tablet Commonly known as:  TRILEPTAL Take 600 mg by mouth at bedtime.   polyethylene glycol powder powder Commonly known as:  GLYCOLAX/MIRALAX Take 17 g by mouth 2 (two) times daily as needed for moderate constipation.   potassium chloride SA 20 MEQ tablet Commonly known as:  K-DUR,KLOR-CON Take 1 tablet (20 mEq total) by mouth daily.   prazosin 5 MG capsule Commonly known as:  MINIPRESS Take 5 mg by mouth at bedtime.   PROAIR HFA 108 (90 Base) MCG/ACT inhaler Generic drug:  albuterol INHALE 2 PUFFS INTO THE LUNGS EVERY 4 (FOUR) HOURS AS NEEDED FOR WHEEZING OR SHORTNESS OF BREATH.   topiramate 200 MG  tablet Commonly known as:  TOPAMAX Take 200 mg by mouth at bedtime.   ziprasidone 80 MG capsule Commonly known as:  GEODON Take 160 mg by mouth 3 (three) times daily.      CXRCOMPARISON: 08/05/2016  FINDINGS: Cardiac shadow is within normal limits. Postoperative changes in the cervical spine are noted. The lungs are well aerated bilaterally. No focal infiltrate or sizable effusion is seen. Previously seen vascular congestion interstitial changes have resolved in the interval. No new focal infiltrate is noted.  IMPRESSION: Resolution of previously seen changes without acute abnormality.  Follow-up: Return in about 2 weeks (around 08/29/2016).  Claretta Fraise, M.D.

## 2016-08-15 NOTE — Patient Outreach (Signed)
[facility]) Care Management  08/15/2016  Darlene Wilcox [birth year] [id number]  EMMI-Heart Failure referral with red dashboard for shortness of breath & know how to take medications: Recent hospital admission  3/11-3/15/2018 with Acute systolic CHF, Acute hypoxemic respiratory failure, tobacco use, bipolar disorder.  Telephone call to patient who was advised of reason for call & of [facility] care management services.  HIPPA verification received from patient.  Patient voices that she was recently diagnosed with heart failure after being in hospital for shortness of breath. States she has put on 4 lbs since hospital stay; having some trouble breathing & has swelling in belly.  States she has not been weighing daily & was not aware that she needed to weigh daily. States she did not know what to do or what doctor to call regarding her difficulties.  Patient was advised of importance of calling her MD to advise of symptoms, taking medications as prescribed and attending hospital follow up appointments. Patient does advise that she has appointment with Dr. [name] (primary care provider) today.  States her friend is providing her transportation to appointment.  Advised of importance of taking medications with her to review at MD appointment. States she did not have medications with her but plans to go back to her home to get them to take to MD appointment today. Patient states she did receive discharge instructions but has read through all of information. Was not aware of activity restrictions, dietary instructions & management of heart failure.   Voices that she is bipolar and has been depressed about her new diagnosis. States she is on medications & saw her psychiatrist-Dr, Challa this week. Has appointment with counselor April 15th.  Advised patient to report to her primary care provider today regarding her depression about her diagnosis. Voices that she will talk to him about it at her  appointment today.   Patient states she has cut down to 2 cigarettes daily from a pack daily following hospital discharge with use of "patch". States she is feeling good about that.    Patient voices that she is interested in [facility] care management services & consents.   Assessment: Recent hospital stay with discharge of 08/09/2016. New diagnosis heart failure. Patient has knowledge deficit of self care with new diagnosis. Hx bipolar disorder-having depression since learning of new diagnosis. On 23 medications/several new heart medications/assist with medication management.. History of smoking 1 pack daily/trying to stop. Has support from friend Falls risk/broke several ribs in Jan 2018 after falling in bathroom.   Plan: Refer to [facility] care coordinator for complex case management of patient with recent hospital discharge, new Heart failure diagnosis, falls risk. Telephonic signing off.  Sherrin Daisy, RN BSN [place] Management Coordinator [facility] Care Management  [phone]

## 2016-08-17 ENCOUNTER — Other Ambulatory Visit: Payer: Self-pay | Admitting: *Deleted

## 2016-08-17 ENCOUNTER — Other Ambulatory Visit: Payer: Self-pay | Admitting: Family Medicine

## 2016-08-17 NOTE — Patient Outreach (Signed)
Referral received from [facility] telephonic RN, RN CM called pt for transition of care week 1 and to schedule home visit, no answer to telephone, left voicemail requesting return phone call.  Jacqlyn Larsen [facility], [place] Coordinator [phone]

## 2016-08-19 ENCOUNTER — Other Ambulatory Visit: Payer: Self-pay | Admitting: Family Medicine

## 2016-08-20 ENCOUNTER — Other Ambulatory Visit: Payer: Self-pay | Admitting: *Deleted

## 2016-08-20 ENCOUNTER — Ambulatory Visit: Payer: Medicare Other | Admitting: Physician Assistant

## 2016-08-20 DIAGNOSIS — I509 Heart failure, unspecified: Secondary | ICD-10-CM

## 2016-08-20 NOTE — Addendum Note (Signed)
Addended by: Jacqlyn Larsen A on: 08/20/2016 02:03 PM   Modules accepted: Orders

## 2016-08-20 NOTE — Progress Notes (Deleted)
Cardiology Office Note    Date:  08/20/2016   ID:  Darlene Wilcox, DOB 10-Feb-1961, MRN [id number]  PCP:  Claretta Fraise, MD  Cardiologist:  Dr. Johnsie Cancel  No chief complaint on file.   History of Present Illness:  Darlene Wilcox is a 56 y.o. female with PMH of Bipolar disorder, chronic back pain, tobacco use, asthma and GERD. She does not have regular follow-up with her primary care physician. She was admitted in March 2018 for shortness of breath, congestive heart failure and prolonged QTC. She was seen by Dr. Johnsie Cancel at the time. Echocardiogram obtained on 08/06/2016 showed EF 40-45%, diffuse hypokinesis, mild MR, PA peak pressure 31 mmHg. According to the patient, her shortness of breath started roughly a year prior to the visit. She eventually underwent cardiac catheterization on 08/08/2016 that only noted a 10% mid RCA lesion, otherwise no significant coronary artery disease, EF was felt to be 40% with global hypokinesis, normal left ventricular diastolic pressure. It was recommended continue medical therapy for nonischemic cardiomyopathy. She was switched to oral Lasix and recommended follow-up as outpatient.  No EKG  Past Medical History:  Diagnosis Date  . Anxiety   . Asthma    daily inhaler  . Bipolar disorder ([place])   . 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 Chronic back pain greater than 3 months duration   . Depression   . Full dentures   . GERD (gastroesophageal reflux disease)   . Headache(784.0)    1-2 x/month  . History of ankle sprain    right; 2 weeks ago;  c/o severe pain  . Seizures ([place])    states "silent seizures"; is on anticonvulsant; none in 2-3 mos.    Past Surgical History:  Procedure Laterality Date  . ABDOMINAL HYSTERECTOMY     partial  . APPENDECTOMY    . BUNIONECTOMY Left   . CARPAL TUNNEL RELEASE Right 01/13/2013   Procedure: CARPAL TUNNEL RELEASE;  Surgeon: Cammie Sickle., MD;  Location: [facility];  Service:  Orthopedics;  Laterality: Right;  . CERVICAL FUSION     x 2  . DIAGNOSTIC LAPAROSCOPY  11/08/2006  . INGUINAL HERNIA REPAIR Right   . LAPAROSCOPIC APPENDECTOMY  11/08/2006  . LEFT HEART CATH AND CORONARY ANGIOGRAPHY N/A 08/08/2016   Procedure: Left Heart Cath and Coronary Angiography;  Surgeon: Wellington Hampshire, MD;  Location: [facility];  Service: Cardiovascular;  Laterality: N/A;  . LUMBAR FUSION  06/28/2014   L2-5  . LUMBAR LAMINECTOMY  02/05/2007   L4-5 fusion  . TUBAL LIGATION      Current Medications: Outpatient Medications Prior to Visit  Medication Sig Dispense Refill  . clonazePAM (KLONOPIN) 1 MG tablet Take 1 tablet (1 mg total) by mouth 3 (three) times daily. 30 tablet 3  . diclofenac sodium (VOLTAREN) 1 % GEL Apply 2 g topically 4 (four) times daily. 1 Tube 1  . esomeprazole (NEXIUM) 40 MG capsule TAKE ONE CAPSULE BY MOUTH EVERY DAY 90 capsule 0  . FLUoxetine (PROZAC) 40 MG capsule Take 40 mg by mouth daily.    . fluticasone (FLOVENT HFA) 110 MCG/ACT inhaler Inhale 2 puffs into the lungs 2 (two) times daily. 1 Inhaler 12  . furosemide (LASIX) 40 MG tablet Take 1 tablet (40 mg total) by mouth daily. 30 tablet 4  . gabapentin (NEURONTIN) 300 MG capsule Take 300 mg by mouth 3 (three) times daily.     Marland Kitchen gemfibrozil (LOPID) 600 MG  tablet TAKE 1 TABLET (600 MG TOTAL) BY MOUTH 2 (TWO) TIMES DAILY BEFORE A MEAL. 60 tablet 5  . imipramine (TOFRANIL) 50 MG tablet TAKE 2 TABLETS BY MOUTH AT BEDTIME 60 tablet 1  . lamoTRIgine (LAMICTAL) 25 MG tablet Take 25 mg by mouth 2 (two) times daily.   3  . losartan (COZAAR) 25 MG tablet Take 1 tablet (25 mg total) by mouth daily. 30 tablet 2  . metoprolol succinate (TOPROL-XL) 25 MG 24 hr tablet Take 1 tablet (25 mg total) by mouth daily. 30 tablet 2  . Multiple Vitamin (MULTIVITAMIN WITH MINERALS) TABS tablet Take 1 tablet by mouth daily.    Marland Kitchen MYRBETRIQ 50 MG TB24 tablet TAKE 1 TABLET (50 MG TOTAL) BY MOUTH DAILY. FOR BLADDER 30 tablet 5  .  naproxen (NAPROSYN) 500 MG tablet TAKE 1 TABLET BY MOUTH TWICE A DAY WITH A MEAL 60 tablet 1  . nicotine (NICODERM CQ - DOSED IN MG/24 HOURS) 21 mg/24hr patch Place 1 patch (21 mg total) onto the skin daily. 28 patch 2  . oxcarbazepine (TRILEPTAL) 600 MG tablet Take 600 mg by mouth at bedtime.     . polyethylene glycol powder (GLYCOLAX/MIRALAX) powder Take 17 g by mouth 2 (two) times daily as needed for moderate constipation. (Patient taking differently: Take 17 g by mouth every morning. ) 3350 g 1  . potassium chloride SA (K-DUR,KLOR-CON) 20 MEQ tablet Take 1 tablet (20 mEq total) by mouth daily. 30 tablet 1  . prazosin (MINIPRESS) 5 MG capsule Take 5 mg by mouth at bedtime.  3  . PROAIR HFA 108 (90 BASE) MCG/ACT inhaler INHALE 2 PUFFS INTO THE LUNGS EVERY 4 (FOUR) HOURS AS NEEDED FOR WHEEZING OR SHORTNESS OF BREATH. 8.5 Inhaler 2  . topiramate (TOPAMAX) 200 MG tablet Take 200 mg by mouth at bedtime.  2  . ziprasidone (GEODON) 80 MG capsule Take 160 mg by mouth 3 (three) times daily.      No facility-administered medications prior to visit.      Allergies:   Apple; Penicillins; and Bee venom   Social History   Social History  . Marital status: Divorced    Spouse name: N/A  . Number of children: N/A  . Years of education: N/A   Occupational History  . Unemployed    Social History Main Topics  . Smoking status: Current Some Day Smoker    Packs/day: 1.00    Years: 20.00    Types: Cigarettes  . Smokeless tobacco: Never Used     Comment:    . Alcohol use No  . Drug use: No  . Sexual activity: Yes    Birth control/ protection: Surgical   Other Topics Concern  . Not on file   Social History Narrative   Lives in [place], [place] with her father.      Family History:  The patient's ***family history includes Diabetes in her maternal grandmother; Lung cancer in her father.   ROS:   Please see the history of present illness.    ROS All other systems reviewed and are  negative.   PHYSICAL EXAM:   VS:  There were no vitals taken for this visit.   GEN: Well nourished, well developed, in no acute distress  HEENT: normal  Neck: no JVD, carotid bruits, or masses Cardiac: ***RRR; no murmurs, rubs, or gallops,no edema  Respiratory:  clear to auscultation bilaterally, normal work of breathing GI: soft, nontender, nondistended, + BS MS: no deformity or atrophy  Skin: warm and dry, no rash Neuro:  Alert and Oriented x 3, Strength and sensation are intact Psych: euthymic mood, full affect  Wt Readings from Last 3 Encounters:  08/15/16 177 lb (80.3 kg)  08/09/16 169 lb (76.7 kg)  07/19/16 170 lb (77.1 kg)      Studies/Labs Reviewed:   EKG:  EKG is*** ordered today.  The ekg ordered today demonstrates ***  Recent Labs: 12/16/2015: ALT 20 08/05/2016: B Natriuretic Peptide 46.0; Hemoglobin 12.1; Platelets 280 08/06/2016: TSH 1.460 08/08/2016: BUN 18; Creatinine, Ser 0.83; Magnesium 2.3; Potassium 4.6; Sodium 135   Lipid Panel    Component Value Date/Time   CHOL 157 08/06/2016 0243   CHOL 220 (H) 11/03/2013 1519   TRIG 261 (H) 08/06/2016 0243   TRIG 359 (H) 07/30/2014 0910   HDL 25 (L) 08/06/2016 0243   HDL 33 (L) 07/30/2014 0910   CHOLHDL 6.3 08/06/2016 0243   VLDL 52 (H) 08/06/2016 0243   LDLCALC 80 08/06/2016 0243   LDLCALC Comment 11/03/2013 1519   LDLCALC 69 03/30/2013 1219    Additional studies/ records that were reviewed today include:   Echo 08/06/2016 LV EF: 40% -   45%  ------------------------------------------------------------------- History:   PMH:  Volume overload.  Risk factors:  Bipolar disorder. Chest pain. Seizures. Current tobacco use.  ------------------------------------------------------------------- Study Conclusions  - Left ventricle: The cavity size was normal. Wall thickness was   normal. Systolic function was mildly to moderately reduced. The   estimated ejection fraction was in the range of 40% to 45%.    Diffuse hypokinesis. - Mitral valve: There was mild regurgitation. - Pulmonary arteries: Systolic pressure was mildly increased. PA   peak pressure: 31 mm Hg (S).   Cath 08/08/2016 Conclusion     Mid RCA lesion, 10 %stenosed.  LV end diastolic pressure is normal.  There is mild to moderate left ventricular systolic dysfunction.  There is no mitral valve regurgitation.   1. Near normal coronary arteries with no evidence of obstructive disease. 2. Mild to moderately reduced LV systolic function with an ejection fraction of 40% with global hypokinesis. 3. Normal left ventricular end-diastolic pressure.  Recommendations: Continue medical therapy for nonischemic cardiomyopathy. The patient does not require further intravenous diuresis and I switched her to small dose oral furosemide. Right heart catheterization was planned. However, it could not be done via the right antecubital area due to cellulitis and the wire could not be advanced via the left antecubital vein. Given that her LVEDP was normal, I elected not to pursue a right heart catheterization. There was no evidence of pulmonary hypertension on echo.       ASSESSMENT:    No diagnosis found.   PLAN:  In order of problems listed above:  1. ***    Medication Adjustments/Labs and Tests Ordered: Current medicines are reviewed at length with the patient today.  Concerns regarding medicines are outlined above.  Medication changes, Labs and Tests ordered today are listed in the Patient Instructions below. There are no Patient Instructions on file for this visit.   Hilbert Corrigan, [place]  08/20/2016 1:42 PM    [facility] [address], [place], [place]  [address] Phone: [phone]; Fax: [phone]

## 2016-08-20 NOTE — Patient Outreach (Signed)
Pt discharged from hospital 08/09/16, referral received 08/16/16 from telephonic RN,  RN CM called pt for transition of care week 1, spoke with pt, HIPAA verified, pt reports she has scales and is weighing daily and recording, pt is not on oxygen, Pt states she is on a lot of medications and sometimes has difficulty afford medications at times and is agreeable for pharmacy referral, pt manages her own medications, pt saw primary care MD 08/15/16, pt sees psychiatrist for bipolar disorder, pt denies edema, has dyspnea with exertion, weight today 163 pounds, RN CM reviewed medications over the phone.  Pt agreeable to weekly transition of care calls and initial home visit for this week.  Referral for [facility] pharmacy placed.  RN CM faxed transition of care note to primary MD Dr. Claretta Fraise.  THN CM Care Plan Problem One     Most Recent Value  Care Plan Problem One  Knowledge deficit related to CHF  Role Documenting the Problem One  Care Management Coordinator  Care Plan for Problem One  Active  THN Long Term Goal (31-90 days)  Pt will verbalize better understanding of CHF and avoid readmission within 90 days aeb improved self care  [employer] Long Term Goal Start Date  08/20/16  Interventions for Problem One Long Term Goal  RN CM reviewed discharge instructions, importance of continuing daily weights and recording, reviewed medications over the phone, importance of attending all MD appointments.  THN CM Short Term Goal #1 (0-30 days)  Pt will verbalize CHF zones/ action plan within 30 days.  THN CM Short Term Goal #1 Start Date  08/20/16  Interventions for Short Term Goal #1  RN CM reviewed CHF zones/ action plan and importance of symptom management, calling MD early for change in health status  THN CM Short Term Goal #2 (0-30 days)  Pt will verbalize better understanding of low salt diet within 30 days.  THN CM Short Term Goal #2 Start Date  08/20/16  Interventions for Short Term Goal #2  RN CM reviewed low  sodium, heart healthy diet and importance of proper food choices      PLAN See pt for initial home visit this week  Jacqlyn Larsen [facility], [place] Coordinator [phone]

## 2016-08-20 NOTE — Patient Outreach (Signed)
[facility]) Care Management  08/20/2016  Darlene Wilcox 06-11-1960 [id number]   Patient triggered Red on EMMI heart failure dashboard, notification sent to Jacqlyn Larsen

## 2016-08-21 ENCOUNTER — Other Ambulatory Visit: Payer: Self-pay | Admitting: *Deleted

## 2016-08-21 NOTE — Patient Outreach (Signed)
Pt Red EMMI alert on 08/20/16, telephone call to pt for follow up, no answer to telephone, left voicemail requesting return phone call.  Jacqlyn Larsen [facility], [place] Coordinator [phone]

## 2016-08-21 NOTE — Patient Outreach (Signed)
[facility]) Care Management  08/21/2016  Darlene Wilcox 09-29-1960 [id number]   Patient triggered RED on EMMI Heart Failure Dashboard, notivication sent to: Jacqlyn Larsen, RN

## 2016-08-22 ENCOUNTER — Other Ambulatory Visit: Payer: Self-pay | Admitting: Pharmacist

## 2016-08-22 ENCOUNTER — Encounter: Payer: Self-pay | Admitting: Pharmacist

## 2016-08-22 ENCOUNTER — Encounter: Payer: Self-pay | Admitting: *Deleted

## 2016-08-22 ENCOUNTER — Other Ambulatory Visit: Payer: Self-pay | Admitting: *Deleted

## 2016-08-22 NOTE — Patient Outreach (Signed)
[name]  08/22/2016  Darlene Wilcox 03-26-1961 [id number]   Dr. Nyra Market office was called to inquire and verify the doses on patient's psychiatric medications. The receptionist said the person who handles medications would not be in until tomorrow. She took down my contact information and said she would have their nurse give me a call tomorrow.  Elayne Guerin, PharmD, [place] Clinical Pharmacist [phone]

## 2016-08-22 NOTE — Patient Outreach (Signed)
[place] [facility]) Care Management  [place]   08/22/2016  ARDYTH KELSO [birth year] [id number]  Subjective: Patient was called regarding medication assistance per referral from [place], Jacqlyn Larsen.  Patient has multiple medical conditions including but not limited to:  Bipolar disorder, asthma, possible seizure disorder, and CHF.   She was hospitalized 08/05/16 for CHF.  Patient reported managing her medications by herself. She said she uses a pill box but sometimes gets confused with her medications.  [facility] [place], [place] and was told one of her prescriptions would be $48. She did not know which medication but said she could not afford to pay $48.00.  Objective:   Encounter Medications: Outpatient Encounter Prescriptions as of 08/22/2016  Medication Sig Note  . clonazePAM (KLONOPIN) 1 MG tablet Take 1 tablet (1 mg total) by mouth 3 (three) times daily.   . diclofenac sodium (VOLTAREN) 1 % GEL Apply 2 g topically 4 (four) times daily.   Marland Kitchen esomeprazole (NEXIUM) 40 MG capsule TAKE ONE CAPSULE BY MOUTH EVERY DAY   . FLUoxetine (PROZAC) 40 MG capsule Take 40 mg by mouth daily.   . fluticasone (FLOVENT HFA) 110 MCG/ACT inhaler Inhale 2 puffs into the lungs 2 (two) times daily.   . furosemide (LASIX) 40 MG tablet Take 1 tablet (40 mg total) by mouth daily.   Marland Kitchen gabapentin (NEURONTIN) 300 MG capsule Take 300 mg by mouth 3 (three) times daily.    Marland Kitchen gemfibrozil (LOPID) 600 MG tablet TAKE 1 TABLET (600 MG TOTAL) BY MOUTH 2 (TWO) TIMES DAILY BEFORE A MEAL.   Marland Kitchen imipramine (TOFRANIL) 50 MG tablet TAKE 2 TABLETS BY MOUTH AT BEDTIME   . lamoTRIgine (LAMICTAL) 25 MG tablet Take 25 mg by mouth 2 (two) times daily.    Marland Kitchen losartan (COZAAR) 25 MG tablet Take 1 tablet (25 mg total) by mouth daily.   . metoprolol succinate (TOPROL-XL) 25 MG 24 hr tablet Take 1 tablet (25 mg total) by mouth daily.   . Multiple Vitamin (MULTIVITAMIN WITH  MINERALS) TABS tablet Take 1 tablet by mouth daily.   Marland Kitchen MYRBETRIQ 50 MG TB24 tablet TAKE 1 TABLET (50 MG TOTAL) BY MOUTH DAILY. FOR BLADDER   . naproxen (NAPROSYN) 500 MG tablet TAKE 1 TABLET BY MOUTH TWICE A DAY WITH A MEAL   . nicotine (NICODERM CQ - DOSED IN MG/24 HOURS) 21 mg/24hr patch Place 1 patch (21 mg total) onto the skin daily.   . polyethylene glycol powder (GLYCOLAX/MIRALAX) powder Take 17 g by mouth 2 (two) times daily as needed for moderate constipation. (Patient taking differently: Take 17 g by mouth every morning. )   . prazosin (MINIPRESS) 5 MG capsule Take 5 mg by mouth at bedtime.   Marland Kitchen PROAIR HFA 108 (90 BASE) MCG/ACT inhaler INHALE 2 PUFFS INTO THE LUNGS EVERY 4 (FOUR) HOURS AS NEEDED FOR WHEEZING OR SHORTNESS OF BREATH.   . topiramate (TOPAMAX) 200 MG tablet Take 200 mg by mouth at bedtime.   . ziprasidone (GEODON) 80 MG capsule Take 160 mg by mouth 3 (three) times daily.  08/22/2016: Patient reported taking 2 tablets at bedtime  . oxcarbazepine (TRILEPTAL) 600 MG tablet Take 600 mg by mouth at bedtime.  08/22/2016: Patient reported she did not have this in her possession.  . potassium chloride SA (K-DUR,KLOR-CON) 20 MEQ tablet Take 1 tablet (20 mEq total) by mouth daily.    No facility-administered encounter medications on file as  of 08/22/2016.     Functional Status: In your present state of health, do you have any difficulty performing the following activities: 08/05/2016 08/05/2016  Hearing? - N  Vision? - N  Difficulty concentrating or making decisions? - N  Walking or climbing stairs? - N  Dressing or bathing? - N  Doing errands, shopping? N -  Some recent data might be hidden    Fall/Depression Screening: PHQ 2/9 Scores 08/15/2016 08/15/2016 06/28/2016 06/28/2016 05/08/2016 04/24/2016 03/15/2016  PHQ - 2 Score 4 4 0 0 1 3 0  PHQ- 9 Score 11 11 - - - 11 -    Assessment: Patient's medications were reconciled via telephone by comparing the medication list in the patient  chart, the most recent discharge summary, and via patient interview.     Drugs sorted by system:  Neurologic/Psychologic: Fluoxetine( patient reported taking both 20mg  and 40mg  capusle) Clonazepam Imipramine Lamotrigine Gabapentin Oxcarbazepine (Patient reported she is not taking this) Topiramate Ziprasidone  Cardiovascular: Losartan Metoprolol Prazosin Gemfibrozil Furosemide  Pulmonary/Allergy: ProAir HFA  Gastrointestinal: Polyethylene Glycol Esomeprazole  Pain: Naproxen Diclofenac gel (requires a PA--not taking yet)  Vitamins/Minerals: Potassium 20 MEq  (patient unsure if she is taking) Multiple Vitamin  Miscellaneous: Myrbetriq Nicotine patch  Medication Review Findings:  Duplications in therapy:   1.  Furosemide-patient reported taking Furosemide 40mg  1.5 tablets daily due to having 2 bottles of furosemide--1 bottle that stated take 1/2 tablet daily and 1 bottle that stated take 1 tablet daily (Dr. [name] increased the furosemide dose to 40mg  daily on 08/15/16)  2.  Fluoxetine-patient reported taking Fluoxetine 40mg  2 capsules daily and Fluoxetine 20mg  1 capsule three times daily--fluoxetine is prescribed by Dr. Franchot Mimes in [place]. Both the discharge summary and the medication list from her 08/15/16 PCP visit state Fluoxetine 40mg  1 capsule daily.    Adherence  1.  Metoprolol XL - Dr. [name] increased the Metoprolol dose from 12.5mg  daily to 25mg  daily on 08/15/16.  Patient reported continuing to take 1/2 tablet daily  2.  Ziprasidone -Patient reported taking 2 tablets (160mg ) at bedtime. Discharge summary and the med list from last PCP visit state 160mg  three times daily   Drug-Drug Interactions  Fluoxetine/naproxen combination may put patient at increased risk of GI bleeding    Medication Assistance Findings:  Patient has [facility] Dual Complete  She receives full Extra Help as such her medications range in price from $1.25 to $3.70  (maximum)  [facility] was sent in from Dr. Wynetta Emery (from her hospitalization) and it required a prior authorization. [facility] also reported sending a [facility] request to the hospital but had not heard back.  I requested they send the PA to Dr. [name] office as he is the patient's PCP and mentioned continuing diclofenac in the most recent visit note.  Patient does not qualify for any patient assistance programs since she has full extra help.    Outside of the diclofenac gel, patient said she can afford to pay for her other medications.   Plan: 1.  Route note to Dr. [name] with a request to assist patient with the PA for diclofenac gel.  2.  Call Dr. Nyra Market office for clarification of psychiatric med doses.  3. Conduct acute home visit with the patient this afternoon since she has multiple medications that are not being dosed correctly.  Elayne Guerin, PharmD, [place] Clinical Pharmacist [phone]

## 2016-08-22 NOTE — Patient Outreach (Signed)
[place]) Care Management   08/22/2016  Darlene Wilcox 1960/10/22 [id number]  Darlene Wilcox is an 56 y.o. female  Subjective: Initial home visit with pt, HIPAA verified, pt reports her dad lives with her and she helps take care of him. Pt states she could "use some help with my medicine, sometime I don't know if I'm taking them right"  Pt reports she weighs daily and needs reinforcement with CHF.  Pt states " I have bipolar disorder and I see a psychiatrist and counselor for this"  Objective:   Vitals:   08/22/16 1238  BP: 110/62  Pulse: 80  Resp: 16  Weight: 162 lb (73.5 kg)  Height: 1.575 m (5\' 2" )   ROS  Physical Exam  Constitutional: She is oriented to person, place, and time. She appears well-developed and well-nourished.  HENT:  Head: Normocephalic.  Neck: Normal range of motion. Neck supple.  Cardiovascular: Normal rate and regular rhythm.   Respiratory: Effort normal and breath sounds normal.  GI: Soft. Bowel sounds are normal.  Musculoskeletal: Normal range of motion. She exhibits no edema.  Neurological: She is alert and oriented to person, place, and time.  Skin: Skin is warm and dry.  Psychiatric: She has a normal mood and affect. Her behavior is normal. Judgment and thought content normal.    Encounter Medications:   Outpatient Encounter Prescriptions as of 08/22/2016  Medication Sig Note  . clonazePAM (KLONOPIN) 1 MG tablet Take 1 tablet (1 mg total) by mouth 3 (three) times daily.   . diclofenac sodium (VOLTAREN) 1 % GEL Apply 2 g topically 4 (four) times daily. (Patient not taking: Reported on 08/22/2016) 08/22/2016: Needs a prior authorization  . esomeprazole (NEXIUM) 40 MG capsule TAKE ONE CAPSULE BY MOUTH EVERY DAY   . FLUoxetine (PROZAC) 40 MG capsule Take 40 mg by mouth daily.   . fluticasone (FLOVENT HFA) 110 MCG/ACT inhaler Inhale 2 puffs into the lungs 2 (two) times daily.   . furosemide (LASIX) 40 MG tablet Take 1 tablet (40  mg total) by mouth daily.   Marland Kitchen gabapentin (NEURONTIN) 300 MG capsule Take 300 mg by mouth 3 (three) times daily.    Marland Kitchen gemfibrozil (LOPID) 600 MG tablet TAKE 1 TABLET (600 MG TOTAL) BY MOUTH 2 (TWO) TIMES DAILY BEFORE A MEAL.   Marland Kitchen imipramine (TOFRANIL) 50 MG tablet TAKE 2 TABLETS BY MOUTH AT BEDTIME   . lamoTRIgine (LAMICTAL) 25 MG tablet Take 25 mg by mouth 2 (two) times daily.    Marland Kitchen losartan (COZAAR) 25 MG tablet Take 1 tablet (25 mg total) by mouth daily.   . metoprolol succinate (TOPROL-XL) 25 MG 24 hr tablet Take 1 tablet (25 mg total) by mouth daily.   . Multiple Vitamin (MULTIVITAMIN WITH MINERALS) TABS tablet Take 1 tablet by mouth daily.   Marland Kitchen MYRBETRIQ 50 MG TB24 tablet TAKE 1 TABLET (50 MG TOTAL) BY MOUTH DAILY. FOR BLADDER   . naproxen (NAPROSYN) 500 MG tablet TAKE 1 TABLET BY MOUTH TWICE A DAY WITH A MEAL   . nicotine (NICODERM CQ - DOSED IN MG/24 HOURS) 21 mg/24hr patch Place 1 patch (21 mg total) onto the skin daily.   Marland Kitchen oxcarbazepine (TRILEPTAL) 600 MG tablet Take 600 mg by mouth at bedtime.  08/22/2016: Patient reported she did not have this in her possession.  . polyethylene glycol powder (GLYCOLAX/MIRALAX) powder Take 17 g by mouth 2 (two) times daily as needed for moderate constipation. (Patient taking differently: Take 17 g  by mouth every morning. )   . potassium chloride SA (K-DUR,KLOR-CON) 20 MEQ tablet Take 1 tablet (20 mEq total) by mouth daily.   . prazosin (MINIPRESS) 5 MG capsule Take 5 mg by mouth at bedtime.   Marland Kitchen PROAIR HFA 108 (90 BASE) MCG/ACT inhaler INHALE 2 PUFFS INTO THE LUNGS EVERY 4 (FOUR) HOURS AS NEEDED FOR WHEEZING OR SHORTNESS OF BREATH.   . topiramate (TOPAMAX) 200 MG tablet Take 200 mg by mouth at bedtime.   . ziprasidone (GEODON) 80 MG capsule Take 160 mg by mouth 3 (three) times daily.  08/22/2016: Patient reported taking 2 tablets at bedtime   No facility-administered encounter medications on file as of 08/22/2016.     Functional Status:   In your present  state of health, do you have any difficulty performing the following activities: 08/22/2016 08/05/2016  Hearing? N -  Vision? N -  Difficulty concentrating or making decisions? N -  Walking or climbing stairs? Y -  Dressing or bathing? N -  Doing errands, shopping? N N  Preparing Food and eating ? N -  Using the Toilet? N -  In the past six months, have you accidently leaked urine? N -  Do you have problems with loss of bowel control? N -  Managing your Medications? Y -  Managing your Finances? N -  Some recent data might be hidden    Fall/Depression Screening:    PHQ 2/9 Scores 08/22/2016 08/15/2016 08/15/2016 06/28/2016 06/28/2016 05/08/2016 04/24/2016  PHQ - 2 Score 4 4 4  0 0 1 3  PHQ- 9 Score 9 11 11  - - - 11   Fall Risk  08/22/2016 08/15/2016 08/15/2016 06/28/2016 06/28/2016  Falls in the past year? Yes Yes No No No  Number falls in past yr: 1 1 - - -  Injury with Fall? Yes Yes - - -  Risk Factor Category  - - - - -  Risk for fall due to : History of fall(s) History of fall(s) - - -  Risk for fall due to (comments): - - - - -  Follow up Education provided;Falls prevention discussed - - - -    Assessment:  Joint visit with [place] and medication list reconciled, pt does not have a clear understanding of how to take some of her medications, pharmacy will continue to work with pt in this area and maybe consider prefilled medication option from local pharmacy.  RN CM focused on CHF action plan, pt is wearing nicoderm patch and has almost completely stopped smoking.  Pt is being currently being treated for bipolar disorder/ depression and it appears pt has not been taking some of her medications for this.  RN CM reviewed safety precautions.  RN CM faxed barrier letter and initial home visit to primary MD Dr. Claretta Fraise.  Plan: continue weekly transition of care calls See for home visit in April Collaborate with [facility] pharmacist as needed  Jacqlyn Larsen [facility], [place]  Coordinator [phone]

## 2016-08-22 NOTE — Patient Outreach (Signed)
[place] [facility]) Care Management  [place]   08/22/2016  Darlene Wilcox [birth year] [id number]  Subjective: Acute pharmacy home visit was completed in patient home due to confusion over the phone about her medications.  Patient is a 56 year old female with multiple medical conditions including but not limited to:  Bipolar disorder, asthma, possible seizure disorder, and CHF.   She was hospitalized 08/05/16 for CHF.  [place] Nurse, Jacqlyn Larsen was just completing her home visit upon my arival.  Patient had her medications out on her kitchen table. Just after [place] Nurse Jacqlyn Larsen left, patient remembered several medications she had stored in her bedroom.  After helping the patient fill her pill box, her pharmacy was called for verification.  Then patient pulled out even more medications out of her bedroom.    Patient is very confused about her medications and thinks she would benefit from having her medications in blister packaging.  Objective:  Weight today: 162 pounds  Encounter Medications: Outpatient Encounter Prescriptions as of 08/22/2016  Medication Sig Note  . clonazePAM (KLONOPIN) 1 MG tablet Take 1 tablet (1 mg total) by mouth 3 (three) times daily.   . clotrimazole (MYCELEX) 10 MG troche Take 1 tablet by mouth 5 (five) times daily. (Dissolve 1 tablet five times daily)   . esomeprazole (NEXIUM) 40 MG capsule TAKE ONE CAPSULE BY MOUTH EVERY DAY   . FLUoxetine (PROZAC) 40 MG capsule Take 80 mg by mouth daily.    . fluticasone (FLOVENT HFA) 110 MCG/ACT inhaler Inhale 2 puffs into the lungs 2 (two) times daily.   . furosemide (LASIX) 40 MG tablet Take 1 tablet (40 mg total) by mouth daily.   Marland Kitchen gabapentin (NEURONTIN) 300 MG capsule Take 300 mg by mouth 3 (three) times daily.    Marland Kitchen gemfibrozil (LOPID) 600 MG tablet TAKE 1 TABLET (600 MG TOTAL) BY MOUTH 2 (TWO) TIMES DAILY BEFORE A MEAL.   Marland Kitchen losartan (COZAAR) 25 MG tablet Take 1 tablet (25 mg  total) by mouth daily.   . metoprolol succinate (TOPROL-XL) 25 MG 24 hr tablet Take 1 tablet (25 mg total) by mouth daily.   . Multiple Vitamin (MULTIVITAMIN WITH MINERALS) TABS tablet Take 1 tablet by mouth daily.   Marland Kitchen MYRBETRIQ 50 MG TB24 tablet TAKE 1 TABLET (50 MG TOTAL) BY MOUTH DAILY. FOR BLADDER   . naproxen (NAPROSYN) 500 MG tablet TAKE 1 TABLET BY MOUTH TWICE A DAY WITH A MEAL   . nicotine (NICODERM CQ - DOSED IN MG/24 HOURS) 21 mg/24hr patch Place 1 patch (21 mg total) onto the skin daily.   Marland Kitchen omega-3 acid ethyl esters (LOVAZA) 1 g capsule Take 1 g by mouth 2 (two) times daily.   . polyethylene glycol powder (GLYCOLAX/MIRALAX) powder Take 17 g by mouth 2 (two) times daily as needed for moderate constipation. (Patient taking differently: Take 17 g by mouth every morning. )   . potassium chloride SA (K-DUR,KLOR-CON) 20 MEQ tablet Take 1 tablet (20 mEq total) by mouth daily.   Marland Kitchen PROAIR HFA 108 (90 BASE) MCG/ACT inhaler INHALE 2 PUFFS INTO THE LUNGS EVERY 4 (FOUR) HOURS AS NEEDED FOR WHEEZING OR SHORTNESS OF BREATH.   . ziprasidone (GEODON) 80 MG capsule Take 160 mg by mouth at bedtime.    . diclofenac sodium (VOLTAREN) 1 % GEL Apply 2 g topically 4 (four) times daily. (Patient not taking: Reported on 08/22/2016) 08/22/2016: Needs a prior authorization  . imipramine (TOFRANIL) 50 MG  tablet TAKE 2 TABLETS BY MOUTH AT BEDTIME   . lamoTRIgine (LAMICTAL) 25 MG tablet Take 25 mg by mouth 2 (two) times daily.    Marland Kitchen oxcarbazepine (TRILEPTAL) 600 MG tablet Take 600 mg by mouth at bedtime.  08/22/2016: Patient reported she did not have this in her possession.  . prazosin (MINIPRESS) 5 MG capsule Take 5 mg by mouth at bedtime.   . topiramate (TOPAMAX) 200 MG tablet Take 100 mg by mouth 2 (two) times daily.     No facility-administered encounter medications on file as of 08/22/2016.     Functional Status: In your present state of health, do you have any difficulty performing the following activities:  08/22/2016 08/05/2016  Hearing? N -  Vision? N -  Difficulty concentrating or making decisions? N -  Walking or climbing stairs? Y -  Dressing or bathing? N -  Doing errands, shopping? N N  Preparing Food and eating ? N -  Using the Toilet? N -  In the past six months, have you accidently leaked urine? N -  Do you have problems with loss of bowel control? N -  Managing your Medications? Y -  Managing your Finances? N -  Some recent data might be hidden    Fall/Depression Screening: PHQ 2/9 Scores 08/22/2016 08/15/2016 08/15/2016 06/28/2016 06/28/2016 05/08/2016 04/24/2016  PHQ - 2 Score 4 4 4  0 0 1 3  PHQ- 9 Score 9 11 11  - - - 11    Assessment: Patient's medications were reviewed in her home by observing her actual medication bottles.  Drugs sorted by system:  Neurologic/Psychologic: Fluoxetine( patient reported taking both 20mg  and 40mg  capsule) Clonazepam Imipramine Lamotrigine Gabapentin Oxcarbazepine (Patient reported she is not taking this--[facility] confirmed [facility] not [facility] since [facility]) Topiramate Ziprasidone  Cardiovascular: Losartan Metoprolol Gemfibrozil Furosemide  Pulmonary/Allergy: ProAir HFA  Gastrointestinal: Polyethylene Glycol Esomeprazole  Pain: Naproxen Diclofenac gel (requires a PA--not taking yet)  Vitamins/Minerals: Potassium 20 MEq  (patient unsure if she is taking) Multiple Vitamin  Miscellaneous: Myrbetriq Nicotine patch Prazosin-unknown-patient did not have this in her possession  Medication Review Findings:  1.  Duplication in therapy:  Fluoxetine-patient reported taking Fluoxetine 40mg  2 capsules daily and Fluoxetine 20mg  1 capsule three times daily--fluoxetine is prescribed by Dr. Franchot Mimes in [place]. Both the discharge summary and the medication list from her 08/15/16 PCP visit state Fluoxetine 40mg  1 capsule daily.   Most recent bottle from Dr. Franchot Mimes states Fluoxetine 40mg  2 capsules daily.  (Fluoxetine 20mg  was removed  from her active medications until clarification from Dr. Franchot Mimes.)   2.  Dose discrepancy  Ziprasidone dose changed to reflect dose on most recent medication label from Dr. Franchot Mimes: 2 capsules at bedtime with food.   3.  Drug interactions  CNS depression/increased risk of falls/impaired ability to operate heavy machinery- combination of gabapentin, topiramate imipramine, clonazepam, Ziprasidone,   3.  Medication Availability from the Pharmacy  Promar-patient reported she does not have any active refills Promar   4.  Adverse Drug Effect Oral thrush-patient is currently using clotrimazole troche for oral thrush. Inhaler technique was reviewed.  Patient had not been rinsing her mouth after Flovent inhalations.   5. Medication list discrepancies Trileptal-patient did not have Trileptal in her possession. [facility] reported [facility] not [facility]. A call has been placed to Dr. Nyra Market office to obtain an accurate list of patient's psychiatric medications. (Dr. Nyra Market office is not on Epic)  Education provided:  1.  Inhaler technique  2.  Rinsing mouth after steroid inhaler  3.  New pill box was provided for the patient and she was assisted with filling the box for the next seven days.  Plan:  1.  Route note to PCP.  2.  Fax note to Dr. Nyra Market office  3.  Investigate options for pill packaging.  4. Follow up with [place] Nurse, Jacqlyn Larsen after speaking with Dr. Franchot Mimes.  5. Follow up with the patient in 1 week.

## 2016-08-23 ENCOUNTER — Other Ambulatory Visit: Payer: Self-pay | Admitting: Pharmacist

## 2016-08-23 ENCOUNTER — Other Ambulatory Visit: Payer: Self-pay | Admitting: *Deleted

## 2016-08-23 NOTE — Patient Outreach (Signed)
Pt Red on EMMI 08/22/16, telephone call to pt, no answer to telephone and no option to leave voicemail.  Jacqlyn Larsen [facility], [place] Coordinator [phone]

## 2016-08-23 NOTE — Patient Outreach (Signed)
[place] [facility]) Care Management  08/23/2016  ELLIEANA DOLECKI 1961-02-10 [id number]   Received a return phone call from [facility] at Dr. Nyra Market office.  Judeen Hammans confirmed they received the medication review note faxed to them.  In addition, Judeen Hammans confirmed the following medication regimen:  1. Geodon 80mg  2 tablets at bedtime 2.  Clonazepam 1 mg- 1 tablet three times dailiy 3.  Fluoxetine 40mg  2 capsules daily 4. Topiramate 100mg - 1 tablet twice daily 5.  Prazosin 5mg - 1 capsule at bedtime 6.  Lamotrigine 25mg  - 2 tablets at bedtime   Discrepancy:  Imipramine 50mg  was written by Dr. [name] office. Dr. Nyra Market office did not have Imipramine on their medication list.  Dr. Nyra Market office also did not have Gabapentin on the medication list at their office.   Judeen Hammans said Dr. Franchot Mimes will not be back in the office until September 03, 2016 and that she will discuss the medication discrepancies with her at that time.   Plan:  1. Investigate pill packaging.  2. Follow up with the patient with in 1 week.  Elayne Guerin, PharmD, [place] Clinical Pharmacist [phone]

## 2016-08-24 ENCOUNTER — Other Ambulatory Visit: Payer: Self-pay | Admitting: Pharmacist

## 2016-08-24 NOTE — Patient Outreach (Signed)
[facility]) Care Management  08/24/2016  ROSI SECRIST 1961-01-09 [id number]   Called patient no answer and no availablity to leave a message as her voice mail box was full.  Purpose of the call was to discuss medication questions answered by Dr. Nyra Market office on 08/23/16 and provide pill packaging information.  Dr. Nyra Market office:  Received a return phone call from [facility] at Dr. Nyra Market office.  Judeen Hammans confirmed they received the medication review note faxed to them.  In addition, Judeen Hammans confirmed the following medication regimen:  1. Geodon 80mg  2 tablets at bedtime 2.  Clonazepam 1 mg- 1 tablet three times dailiy 3.  Fluoxetine 40mg  2 capsules daily 4. Topiramate 100mg - 1 tablet twice daily 5.  Prazosin 5mg - 1 capsule at bedtime 6.  Lamotrigine 25mg  - 2 tablets at bedtime   Discrepancies:  Imipramine 50mg  was written by Dr. [name] office. Dr. Nyra Market office did not have Imipramine on their medication list.  Dr. Nyra Market office also did not have Gabapentin on the medication list at their office.   Judeen Hammans said Dr. Franchot Mimes will not be back in the office until September 03, 2016 and that she will discuss the medication discrepancies with her at that time.  Blister Packaging Information for Area Pharmacies:  1.  [facility]--offers blister packs at no additional cost to the patient, they will deliver to [facility] and will attempt to sync medications so that after a few months, all prescriptions in the packs will be refilled at the same time. .  [place] with pharmacy technician, Roselyn Reef.  They offer blister packaging for a fee of $15 per month.  They also deliver at no additional cost. Blister packs are done one to two weeks at a time.   3.  [facility] -offers blister packaging at no additional fee and will deliver to [facility] and attempt to sync.  4.  [facility] blister packaging at  no additional fee and will deliver to [facility] and attempt to sync.   Plan:  I will call the patient back within 3 business days.  Elayne Guerin, PharmD, [place] Clinical Pharmacist [phone]

## 2016-08-27 ENCOUNTER — Other Ambulatory Visit: Payer: Self-pay | Admitting: Pharmacist

## 2016-08-27 ENCOUNTER — Other Ambulatory Visit: Payer: Self-pay | Admitting: *Deleted

## 2016-08-27 NOTE — Patient Outreach (Signed)
[facility]) Care Management  08/27/2016  Darlene Wilcox 07-Mar-1961 [id number]   Patient was called to follow up on blister packaging and medication list discrepancies.  No answer. HIPAA compliant message left on patient's voice mail.  Plan:  I will follow up with the patient in three business days.  Elayne Guerin, PharmD, [place] Clinical Pharmacist [phone]

## 2016-08-27 NOTE — Patient Outreach (Signed)
Telephone call to pt for transition of care week 2, no answer to telephone and no option to leave voicemail.  Jacqlyn Larsen [facility], [place] Coordinator [phone]

## 2016-08-29 ENCOUNTER — Ambulatory Visit: Payer: Medicare Other | Admitting: Family Medicine

## 2016-08-29 ENCOUNTER — Ambulatory Visit: Payer: Medicare Other | Admitting: Physician Assistant

## 2016-08-30 ENCOUNTER — Other Ambulatory Visit: Payer: Self-pay | Admitting: Pharmacist
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
## 2016-08-31 ENCOUNTER — Other Ambulatory Visit: Payer: Self-pay
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
## 2016-08-31 NOTE — Patient Outreach (Signed)
Patient triggered Red on [facility]i Heart Failure Dashboard, Notification sent to Quinn Plowman, RN

## 2016-09-03 ENCOUNTER — Encounter: Payer: Self-pay | Admitting: Family Medicine

## 2016-09-03 ENCOUNTER — Other Ambulatory Visit: Payer: Self-pay | Admitting: *Deleted
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
## 2016-09-04 ENCOUNTER — Encounter: Payer: Self-pay | Admitting: Family Medicine

## 2016-09-04 ENCOUNTER — Other Ambulatory Visit: Payer: Self-pay | Admitting: Family Medicine

## 2016-09-04 ENCOUNTER — Encounter: Payer: Self-pay | Admitting: Pharmacist

## 2016-09-04 ENCOUNTER — Other Ambulatory Visit: Payer: Self-pay | Admitting: Pharmacist

## 2016-09-04 ENCOUNTER — Ambulatory Visit (INDEPENDENT_AMBULATORY_CARE_PROVIDER_SITE_OTHER): Payer: Medicare Other | Admitting: Family Medicine

## 2016-09-04 VITALS — BP 89/55 | HR 66 | Temp 97.0°F | Ht 62.0 in | Wt 171.0 lb

## 2016-09-04 DIAGNOSIS — I9589 Other hypotension: Secondary | ICD-10-CM | POA: Diagnosis not present

## 2016-09-04 DIAGNOSIS — I509 Heart failure, unspecified: Secondary | ICD-10-CM | POA: Diagnosis not present

## 2016-09-04 NOTE — Patient Outreach (Addendum)
[facility]) Care Management  [place]   09/04/2016  Darlene Wilcox 06-06-1960 [id number]  Subjective: Home visit completed today in patient's home to follow up on medication adherence/management and to review her pill box. HIPAA identifiers were obtained.   Patient is a 56 year old female with multiple medical conditions including but not limited to:  Bipolar disorder, asthma, possible seizure disorder, and CHF. She was hospitalized 08/05/16 for CHF.   Patient's sister [name]) was present for the home visit and will be living with the patient for the next few months.    Patient filled her pill box for the week and had taken her morning medications for the day prior to the home visit.  Objective:  Weight-160 Pulse Ox-99 Pulse-84  Encounter Medications: Outpatient Encounter Prescriptions as of 09/04/2016  Medication Sig Note  . Calcium Citrate-Vitamin D (CALCIUM CITRATE + D3 PO) Take 1 tablet by mouth daily.   . vitamin C (ASCORBIC ACID) 500 MG tablet Take 1,500 mg by mouth daily.   . clonazePAM (KLONOPIN) 1 MG tablet Take 1 tablet (1 mg total) by mouth 3 (three) times daily.   . clotrimazole (MYCELEX) 10 MG troche Take 1 tablet by mouth 5 (five) times daily. (Dissolve 1 tablet five times daily)   . diclofenac sodium (VOLTAREN) 1 % GEL Apply 2 g topically 4 (four) times daily. (Patient not taking: Reported on 08/22/2016) 08/22/2016: Needs a prior authorization  . esomeprazole (NEXIUM) 40 MG capsule TAKE ONE CAPSULE BY MOUTH EVERY DAY   . FLUoxetine (PROZAC) 40 MG capsule Take 80 mg by mouth daily.    . fluticasone (FLOVENT HFA) 110 MCG/ACT inhaler Inhale 2 puffs into the lungs 2 (two) times daily.   . furosemide (LASIX) 40 MG tablet Take 1 tablet (40 mg total) by mouth daily.   [facility] gabapentin (NEURONTIN) 300 MG capsule Take 300 mg by mouth 3 (three) times daily.    Marland Kitchen gemfibrozil (LOPID) 600 MG tablet TAKE 1 TABLET (600 MG TOTAL) BY MOUTH 2 (TWO) TIMES DAILY  BEFORE A MEAL.   Marland Kitchen imipramine (TOFRANIL) 50 MG tablet TAKE 2 TABLETS BY MOUTH AT BEDTIME   . lamoTRIgine (LAMICTAL) 25 MG tablet Take 50 mg by mouth at bedtime.    Marland Kitchen losartan (COZAAR) 25 MG tablet Take 1 tablet (25 mg total) by mouth daily.   . metoprolol succinate (TOPROL-XL) 25 MG 24 hr tablet Take 1 tablet (25 mg total) by mouth daily.   . Multiple Vitamin (MULTIVITAMIN WITH MINERALS) TABS tablet Take 1 tablet by mouth daily.   Mar[place] Kitchen MYRBETRIQ 50 MG TB24 tablet TAKE 1 TABLET (50 MG TOTAL) BY MOUTH DAILY. FOR BLADDER   . naproxen (NAPROSYN) 500 MG tablet TAKE 1 TABLET BY MOUTH TWICE A DAY WITH A MEAL   . nicotine (NICODERM CQ - DOSED IN MG/24 HOURS) 21 mg/24hr patch Place 1 patch (21 mg total) onto the skin daily.   Mar[place] Kitchen omega-3 acid ethyl esters (LOVAZA) 1 g capsule Take 1 g by mouth 2 (two) times daily.   . polyethylene glycol powder (GLYCOLAX/MIRALAX) powder Take 17 g by mouth 2 (two) times daily as needed for moderate constipation. (Patient taking differently: Take 17 g by mouth every morning. )   . potassium chloride SA (K-DUR,KLOR-CON) 20 MEQ tablet Take 1 tablet (20 mEq total) by mouth daily.   . prazosin (MINIPRESS) 5 MG capsule Take 5 mg by mouth at bedtime.    Marland Kitchen PROAIR HFA 108 (90 BASE) MCG/ACT inhaler INHALE  2 PUFFS INTO THE LUNGS EVERY 4 (FOUR) HOURS AS NEEDED FOR WHEEZING OR SHORTNESS OF BREATH.   . topiramate (TOPAMAX) 200 MG tablet Take 200 mg by mouth at bedtime.  08/23/2016: Take 1 tablet twice daily  . ziprasidone (GEODON) 80 MG capsule Take 160 mg by mouth at bedtime.     No facility-administered encounter medications on file as of 09/04/2016.     Functional Status: In your present state of health, do you have any difficulty performing the following activities: 08/22/2016 08/05/2016  Hearing? N -  Vision? N -  Difficulty concentrating or making decisions? N -  Walking or climbing stairs? Y -  Dressing or bathing? N -  Doing errands, shopping? N N  Preparing Food and eating ? N -   Using the Toilet? N -  In the past six months, have you accidently leaked urine? N -  Do you have problems with loss of bowel control? N -  Managing your Medications? Y -  Managing your Finances? N -  Some recent data might be hidden    Fall/Depression Screening: PHQ 2/9 Scores 08/22/2016 08/15/2016 08/15/2016 06/28/2016 06/28/2016 05/08/2016 04/24/2016  PHQ - 2 Score 4 4 4  0 0 1 3  PHQ- 9 Score 9 11 11  - - - 11    Assessment: Patient's medications were reviewed in her home by observing her actual medication bottles.  Her pill box was inspected, corrected and refilled for the next seven days.  Drugs sorted by system:  Neurologic/Psychologic: Fluoxetine Clonazepam Imipramine Lamotrigine Gabapentin Topiramate Ziprasidone Prazosin  Cardiovascular: Losartan Metoprolol Gemfibrozil Furosemide Omega 3 Fatty Acids  Pulmonary/Allergy: ProAir HFA Flovent HFA  Gastrointestinal: Polyethylene Glycol Esomeprazole  Pain: Naproxen  Vitamins/Minerals: Potassium 20 MEq Multiple Vitamin Vitamin C Calcium Citrate/Vitamin D   Miscellaneous: Myrbetriq Nicotine patch Clotrimazole Troche  Medication Review Findings:  Dose Discrepancies:  1.  Fluoxetine- medication bottle most recently filled stated Fluoxetine 40mg  1 capsule daily.  Patient reported she is taking 2 capsules daily.  Dr. Nyra Market office confirmed patient is supposed to be taking Fluoxetine 40mg  2 capsules daily.  [facility] reported the prescription was written for Fluoxetine 40mg  1 capsule daily.  Dr. Nyra Market office was called back to request a new prescription for the correct dose be sent to the patient's pharmacy.  2.  Omega 3 Fatty Acids- medication list states 1 capsule twice daily.  Patient is only taking 1 capsule daily   Adherence-patient filled her pill box on her own.  Review of the pill box revealed several missed doses of various medications. The pill box was filled correctly by pharmacist for the  next seven days.  The patient's sister said she would help the patient next week.  However, pill packing would probably be the best option for the patient as she has >10 tablets to take in the morning and evening.   Patient decided to use [facility] to have her prescriptions filled in pill packs and be delivered to her home.    Drug/Drug interaction-CNS depression/increased risk of falls/impaired ability to operate heavy machinery- combination of gabapentin, Topiramate imipramine, clonazepam, Ziprasidone, prazosin, clonazepam   Plan:  1.  Route note to PCP--request prescriptions be sent to [facility] with the following on the comment line (please dispense patient's medications in pill packs)  2.  Recall Dr. Nyra Market office about Fluoxetine and to request psych medications be sent to [facility] family pharmacy.  3.  Follow up with the patient within 1 week to help get her set  up with [facility].    Elayne Guerin, PharmD, [place] Clinical Pharmacist [phone]

## 2016-09-04 NOTE — Progress Notes (Signed)
Subjective:  Patient ID: Darlene Wilcox, female    DOB: 1960-11-12  Age: 56 y.o. MRN: [id number]  CC: Congestive Heart Failure (pt here today following up from her CHF diagnosis)   HPI Darlene Wilcox presents for breathing, sleeping better. Less edema.   History Darlene Wilcox has a past medical history of Anxiety; Asthma; Bipolar disorder ([place]); Carpal tunnel syndrome of right wrist (12/2012); Chronic back pain greater than 3 months duration; Depression; Full dentures; GERD (gastroesophageal reflux disease); Headache(784.0); History of ankle sprain; and Seizures ([place]).   She has a past surgical history that includes Cervical fusion; Tubal ligation; Abdominal hysterectomy; Inguinal hernia repair (Right); Bunionectomy (Left); Diagnostic laparoscopy (11/08/2006); Laparoscopic appendectomy (11/08/2006); Lumbar laminectomy (02/05/2007); Carpal tunnel release (Right, 01/13/2013); Appendectomy; Lumbar fusion (06/28/2014); and Left Heart Cath and Coronary Angiography (N/A, 08/08/2016).   Her family history includes Diabetes in her maternal grandmother; Lung cancer in her father.She reports that she has been smoking Cigarettes.  She has a 20.00 pack-year smoking history. She has never used smokeless tobacco. She reports that she does not drink alcohol or use drugs.    ROS Review of Systems  Constitutional: Negative for fever.  HENT: Negative for congestion, rhinorrhea and sore throat.   Respiratory: Negative for cough and shortness of breath.   Cardiovascular: Negative for chest pain and palpitations.  Gastrointestinal: Negative for abdominal pain.  Musculoskeletal: Negative for arthralgias and myalgias.    Objective:  BP (!) 89/55   Pulse 66   Temp 97 F (36.1 C) (Oral)   Ht 5' 2"  (1.575 m)   Wt 171 lb (77.6 kg)   BMI 31.28 kg/m   BP Readings from Last 3 Encounters:  09/04/16 (!) 89/55  08/22/16 110/62  08/15/16 102/64    Wt Readings from Last 3 Encounters:  09/04/16 171 lb (77.6 kg)    08/22/16 162 lb (73.5 kg)  08/15/16 177 lb (80.3 kg)     Physical Exam  Constitutional: She is oriented to person, place, and time. She appears well-developed and well-nourished. No distress.  Cardiovascular: Normal rate and regular rhythm.   Pulmonary/Chest: Breath sounds normal.  Musculoskeletal: Normal range of motion.  Neurological: She is alert and oriented to person, place, and time.  Skin: Skin is warm and dry.      Assessment & Plan:   Darlene Wilcox was seen today for congestive heart failure.  Diagnoses and all orders for this visit:  Congestive heart failure, unspecified congestive heart failure chronicity, unspecified congestive heart failure type ([place]) -     CBC with Differential/Platelet -     CMP14+EGFR -     Brain natriuretic peptide  Hypotension, iatrogenic       I have discontinued Darlene Wilcox's prazosin. I am also having her maintain her ziprasidone, multivitamin with minerals, topiramate, clonazePAM, lamoTRIgine, gabapentin, PROAIR HFA, polyethylene glycol powder, fluticasone, MYRBETRIQ, FLUoxetine, esomeprazole, imipramine, diclofenac sodium, losartan, nicotine, potassium chloride SA, metoprolol succinate, furosemide, gemfibrozil, naproxen, omega-3 acid ethyl esters, clotrimazole, Calcium Citrate-Vitamin D (CALCIUM CITRATE + D3 PO), and vitamin C.  Allergies as of 09/04/2016      Reactions   Apple Anaphylaxis, Shortness Of Breath   Penicillins Anaphylaxis, Shortness Of Breath, Itching, Swelling   Has patient had a PCN reaction causing immediate rash, facial/tongue/throat swelling, SOB or lightheadedness with hypotension: Unknown, childhood reaction Has patient had a PCN reaction causing severe rash involving mucus membranes or skin necrosis: No Has patient had a PCN reaction that required hospitalization No Has patient had a PCN reaction  occurring within the last 10 years: No If all of the above answers are "NO", then may proceed with Cephalosporin use.    Bee Venom Swelling   Takes benadryl to help with reaction      Medication List       Accurate as of 09/04/16 10:50 PM. Always use your most recent med list.          CALCIUM CITRATE + D3 PO Take 1 tablet by mouth daily.   clonazePAM 1 MG tablet Commonly known as:  KLONOPIN Take 1 tablet (1 mg total) by mouth 3 (three) times daily.   clotrimazole 10 MG troche Commonly known as:  MYCELEX Take 1 tablet by mouth 5 (five) times daily. (Dissolve 1 tablet five times daily)   diclofenac sodium 1 % Gel Commonly known as:  VOLTAREN Apply 2 g topically 4 (four) times daily.   esomeprazole 40 MG capsule Commonly known as:  NEXIUM TAKE ONE CAPSULE BY MOUTH EVERY DAY   FLUoxetine 40 MG capsule Commonly known as:  PROZAC Take 80 mg by mouth daily.   fluticasone 110 MCG/ACT inhaler Commonly known as:  FLOVENT HFA Inhale 2 puffs into the lungs 2 (two) times daily.   furosemide 40 MG tablet Commonly known as:  LASIX Take 1 tablet (40 mg total) by mouth daily.   gabapentin 300 MG capsule Commonly known as:  NEURONTIN Take 300 mg by mouth 3 (three) times daily.   gemfibrozil 600 MG tablet Commonly known as:  LOPID TAKE 1 TABLET (600 MG TOTAL) BY MOUTH 2 (TWO) TIMES DAILY BEFORE A MEAL.   imipramine 50 MG tablet Commonly known as:  TOFRANIL TAKE 2 TABLETS BY MOUTH AT BEDTIME   lamoTRIgine 25 MG tablet Commonly known as:  LAMICTAL Take 50 mg by mouth at bedtime.   losartan 25 MG tablet Commonly known as:  COZAAR Take 1 tablet (25 mg total) by mouth daily.   metoprolol succinate 25 MG 24 hr tablet Commonly known as:  TOPROL-XL Take 1 tablet (25 mg total) by mouth daily.   multivitamin with minerals Tabs tablet Take 1 tablet by mouth daily.   MYRBETRIQ 50 MG Tb24 tablet Generic drug:  mirabegron ER TAKE 1 TABLET (50 MG TOTAL) BY MOUTH DAILY. FOR BLADDER   naproxen 500 MG tablet Commonly known as:  NAPROSYN TAKE 1 TABLET BY MOUTH TWICE A DAY WITH A MEAL   nicotine  21 mg/24hr patch Commonly known as:  NICODERM CQ - dosed in mg/24 hours Place 1 patch (21 mg total) onto the skin daily.   omega-3 acid ethyl esters 1 g capsule Commonly known as:  LOVAZA Take 1 g by mouth 2 (two) times daily.   polyethylene glycol powder powder Commonly known as:  GLYCOLAX/MIRALAX Take 17 g by mouth 2 (two) times daily as needed for moderate constipation.   potassium chloride SA 20 MEQ tablet Commonly known as:  K-DUR,KLOR-CON Take 1 tablet (20 mEq total) by mouth daily.   PROAIR HFA 108 (90 Base) MCG/ACT inhaler Generic drug:  albuterol INHALE 2 PUFFS INTO THE LUNGS EVERY 4 (FOUR) HOURS AS NEEDED FOR WHEEZING OR SHORTNESS OF BREATH.   topiramate 200 MG tablet Commonly known as:  TOPAMAX Take 200 mg by mouth at bedtime.   vitamin C 500 MG tablet Commonly known as:  ASCORBIC ACID Take 1,500 mg by mouth daily.   ziprasidone 80 MG capsule Commonly known as:  GEODON Take 160 mg by mouth at bedtime.  Follow-up: Return for Pt. discharged from office due to multiple no shows.  Claretta Fraise, M.D.

## 2016-09-05 ENCOUNTER — Telehealth: Payer: Self-pay | Admitting: Physician Assistant

## 2016-09-05 ENCOUNTER — Other Ambulatory Visit: Payer: Self-pay | Admitting: *Deleted

## 2016-09-05 ENCOUNTER — Telehealth: Payer: Self-pay | Admitting: *Deleted

## 2016-09-05 LAB — CMP14+EGFR
ALK PHOS: 120 IU/L — AB (ref 39–117)
ALT: 37 IU/L — AB (ref 0–32)
AST: 31 IU/L (ref 0–40)
Albumin/Globulin Ratio: 1.7 (ref 1.2–2.2)
Albumin: 4.1 g/dL (ref 3.5–5.5)
BUN/Creatinine Ratio: 24 — ABNORMAL HIGH (ref 9–23)
BUN: 14 mg/dL (ref 6–24)
Bilirubin Total: <0.2 mg/dL (ref 0.0–1.2)
CALCIUM: 9.1 mg/dL (ref 8.7–10.2)
CO2: 22 mmol/L (ref 18–29)
CREATININE: 0.59 mg/dL (ref 0.57–1.00)
Chloride: 104 mmol/L (ref 96–106)
GFR calc Af Amer: 120 mL/min/{1.73_m2} (ref >59)
GFR, EST NON AFRICAN AMERICAN: 104 mL/min/{1.73_m2} (ref >59)
GLUCOSE: 114 mg/dL — AB (ref 65–99)
Globulin, Total: 2.4 g/dL (ref 1.5–4.5)
Potassium: 5 mmol/L (ref 3.5–5.2)
Sodium: 142 mmol/L (ref 134–144)
Total Protein: 6.5 g/dL (ref 6.0–8.5)

## 2016-09-05 LAB — CBC WITH DIFFERENTIAL/PLATELET
BASOS ABS: 0.1 10*3/uL (ref 0.0–0.2)
Basos: 1 %
EOS (ABSOLUTE): 0.3 10*3/uL (ref 0.0–0.4)
EOS: 5 %
HEMATOCRIT: 36.7 % (ref 34.0–46.6)
Hemoglobin: 12 g/dL (ref 11.1–15.9)
IMMATURE GRANULOCYTES: 1 %
Immature Grans (Abs): 0 10*3/uL (ref 0.0–0.1)
Lymphocytes Absolute: 2.1 10*3/uL (ref 0.7–3.1)
Lymphs: 33 %
MCH: 27.7 pg (ref 26.6–33.0)
MCHC: 32.7 g/dL (ref 31.5–35.7)
MCV: 85 fL (ref 79–97)
MONOS ABS: 0.4 10*3/uL (ref 0.1–0.9)
Monocytes: 6 %
NEUTROS PCT: 54 %
Neutrophils Absolute: 3.4 10*3/uL (ref 1.4–7.0)
PLATELETS: 226 10*3/uL (ref 150–379)
RBC: 4.33 x10E6/uL (ref 3.77–5.28)
RDW: 15.3 % (ref 12.3–15.4)
WBC: 6.3 10*3/uL (ref 3.4–10.8)

## 2016-09-05 LAB — BRAIN NATRIURETIC PEPTIDE: BNP: 23.2 pg/mL (ref 0.0–100.0)

## 2016-09-05 NOTE — Telephone Encounter (Signed)
Returned the phone call to the patient. Unable to leave a message.

## 2016-09-05 NOTE — Telephone Encounter (Signed)
[id number] 

## 2016-09-05 NOTE — Telephone Encounter (Signed)
Called patient to give her lab results, and she asked if she could schedule an appointment with Dr. [name].  I noted that her chart shows that she has been discharged from the practice due to excessive no shows.  Patient states she was in the hospital at the time of her last appointment with Dr. [name].  Advised per Dr. [name] and practice manager that if she can provide documentation of her hospitalization - such as discharge instructions or any other paper work showing she was in the hospital- then she can reschedule with Dr. [name].  Notified patient of this via voicemail, asked her to call back with any questions.

## 2016-09-05 NOTE — Patient Outreach (Signed)
Red EMMI flag for new or worsening problems, telephone call to pt for follow up, spoke with pt,  HIPAA verified, pt states she does not have new or worsening problems.  Pt states " some days I just have a bad today, but I'm fine today".  Pt states her sister [name] is with her today assisting her in the home.  Jacqlyn Larsen [facility], [place] Coordinator [phone]

## 2016-09-05 NOTE — Telephone Encounter (Signed)
Patient calling states that she was speaking with someone and her phone disconnected,thanks.

## 2016-09-06 ENCOUNTER — Other Ambulatory Visit: Payer: Self-pay | Admitting: Pharmacist

## 2016-09-06 ENCOUNTER — Other Ambulatory Visit: Payer: Self-pay | Admitting: *Deleted

## 2016-09-06 NOTE — Patient Outreach (Addendum)
[facility]) Care Management  09/06/2016  LORETHA URE June 15, 1960 [id number]   Patient was called to follow up on pill packaging following her appointment with Dr. [name] on Tuesday 09/03/16.  HIPAA identifiers were obtained.  Dr. [name] discontinued Prazosin due to hypotension during the patient's visit.   However, he did not send any prescriptions in for the patient. His note also stated the patient was being discharged from the practice for multiple no show appointments.  Patient has an appointment with Cardiology tomorrow (Dr. Eulas Post).   Patient was instructed to have Dr. Eulas Post send any prescriptions to [facility] and put "please put in pill packaging" on the comment line.  Dr. Nyra Market office was called and spoke with Judeen Hammans to let them know Prazosin was discontinued.  Plan:  1.  I will follow up with the patient on Monday to help coordinate pill packaging.  2.  Coordinate with [place] Nurse, Jacqlyn Larsen.  Elayne Guerin, PharmD, [place] Clinical Pharmacist [phone]

## 2016-09-06 NOTE — Patient Outreach (Addendum)
Telephone call to pt for Red EMMI flag, no answer to telephone and left voicemail requesting return phone call.  Jacqlyn Larsen [facility], [place] Coordinator [phone]

## 2016-09-06 NOTE — Telephone Encounter (Signed)
Returned the phone call to the patient. She wanted to verify her appointment for tomorrow at 2pm with Almyra Deforest, PA. She verbalized that she understood.

## 2016-09-07 ENCOUNTER — Ambulatory Visit (INDEPENDENT_AMBULATORY_CARE_PROVIDER_SITE_OTHER): Payer: Medicare Other | Admitting: Physician Assistant

## 2016-09-07 ENCOUNTER — Encounter: Payer: Self-pay | Admitting: Physician Assistant

## 2016-09-07 ENCOUNTER — Other Ambulatory Visit: Payer: Self-pay | Admitting: *Deleted

## 2016-09-07 VITALS — BP 96/64 | HR 97 | Ht 62.0 in | Wt 168.0 lb

## 2016-09-07 DIAGNOSIS — R079 Chest pain, unspecified: Secondary | ICD-10-CM | POA: Diagnosis not present

## 2016-09-07 DIAGNOSIS — I428 Other cardiomyopathies: Secondary | ICD-10-CM

## 2016-09-07 MED ORDER — BLOOD PRESS MONITOR/M-L CUFF MISC: 1.0000 | Freq: Every day | 0 refills | Status: DC

## 2016-09-07 NOTE — Patient Outreach (Signed)
[facility]) Care Management  09/07/2016  Darlene Wilcox 06-10-1960 [id number]   Patient triggered Red on EMMI heart failure dashboard, notification sent to Jacqlyn Larsen, RN

## 2016-09-07 NOTE — Patient Outreach (Signed)
Red EMMI on 09/06/16 for weight being 305 pounds, telephone call to pt, spoke with pt, HIPAA verified, pt reports " I don't know where that came from, my weight is 163 pounds"  Pt states she has difficulty understanding the questions over the phone but does not want to cancel the EMMI calls.  Jacqlyn Larsen [facility], [place] Coordinator [phone]

## 2016-09-07 NOTE — Progress Notes (Addendum)
Cardiology Office Note    Date:  09/08/2016   ID:  Darlene Wilcox, DOB [birth year], MRN [id number]  PCP:  Claretta Fraise, MD  Cardiologist:  Dr. Johnsie Cancel  Chief Complaint  Patient presents with  . Hospitalization Follow-up    seen for Dr. Johnsie Cancel  . Shortness of Breath    occasionally.  . Dizziness    randomly.    History of Present Illness:  Darlene Wilcox is a 56 y.o. female with PMH of bipolar disorder, history of anxiety/depression, headache, and seizure who presented to the hospital on 08/05/2016 with shortness of breath, CHF and prolonged QT. She improved on diuresis. QT normalized. Echocardiogram obtained on 08/06/2016 showed EF 40-45%, mild MR, diffuse hypokinesis, PA peak pressure 31 mmHg. It appears her symptom started a year ago and many worse with exertion. She was not started on beta blocker due to asthma exacerbation. Cardiac catheterization performed on 08/08/2016 showed 10% RCA, normal LVEDP, EF 40%. No significant coronary artery disease seen.  She presents today for cardiology office visit, she continued to have chest discomfort. She also complained of Occasional shortness of breath. On physical exam, she does not appears to be volume overloaded. She also mentions random dizziness as well. It appears her blood pressure has always been borderline. She is on both losartan and metoprolol XL. If needed, we can decrease losartan some point. However will continue on them for now given her LV dysfunction. I have reassured her for the time being as I do not think her chest pain is cardiac related given recent negative cath. Given her borderline blood pressure, it is very unlikely for her to have dissection. She smoked heavily, we did discuss tobacco cessation.   Past Medical History:  Diagnosis Date  . Anxiety   . Asthma    daily inhaler  . Bipolar disorder ([place])   . 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 Chronic back pain greater than 3 months duration   .  Depression   . Full dentures   . GERD (gastroesophageal reflux disease)   . Headache(784.0)    1-2 x/month  . History of ankle sprain    right; 2 weeks ago;  c/o severe pain  . Seizures ([place])    states "silent seizures"; is on anticonvulsant; none in 2-3 mos.    Past Surgical History:  Procedure Laterality Date  . ABDOMINAL HYSTERECTOMY     partial  . APPENDECTOMY    . BUNIONECTOMY Left   . CARPAL TUNNEL RELEASE Right 01/13/2013   Procedure: CARPAL TUNNEL RELEASE;  Surgeon: Cammie Sickle., MD;  Location: [facility];  Service: Orthopedics;  Laterality: Right;  . CERVICAL FUSION     x 2  . DIAGNOSTIC LAPAROSCOPY  11/08/2006  . INGUINAL HERNIA REPAIR Right   . LAPAROSCOPIC APPENDECTOMY  11/08/2006  . LEFT HEART CATH AND CORONARY ANGIOGRAPHY N/A 08/08/2016   Procedure: Left Heart Cath and Coronary Angiography;  Surgeon: Wellington Hampshire, MD;  Location: [place];  Service: Cardiovascular;  Laterality: N/A;  . LUMBAR FUSION  06/28/2014   L2-5  . LUMBAR LAMINECTOMY  02/05/2007   L4-5 fusion  . TUBAL LIGATION      Current Medications: Outpatient Medications Prior to Visit  Medication Sig Dispense Refill  . Calcium Citrate-Vitamin D (CALCIUM CITRATE + D3 PO) Take 1 tablet by mouth daily.    . clonazePAM (KLONOPIN) 1 MG tablet Take 1 tablet (1 mg total) by mouth 3 (three) times  daily. 30 tablet 3  . clotrimazole (MYCELEX) 10 MG troche Take 1 tablet by mouth 5 (five) times daily. (Dissolve 1 tablet five times daily)    . diclofenac sodium (VOLTAREN) 1 % GEL Apply 2 g topically 4 (four) times daily. 1 Tube 1  . esomeprazole (NEXIUM) 40 MG capsule TAKE ONE CAPSULE BY MOUTH EVERY DAY 90 capsule 0  . FLUoxetine (PROZAC) 40 MG capsule Take 80 mg by mouth daily.     . fluticasone (FLOVENT HFA) 110 MCG/ACT inhaler Inhale 2 puffs into the lungs 2 (two) times daily. 1 Inhaler 12  . furosemide (LASIX) 40 MG tablet Take 1 tablet (40 mg total) by mouth daily. 30 tablet 4    . gabapentin (NEURONTIN) 300 MG capsule Take 300 mg by mouth 3 (three) times daily.     Marland Kitchen gemfibrozil (LOPID) 600 MG tablet TAKE 1 TABLET (600 MG TOTAL) BY MOUTH 2 (TWO) TIMES DAILY BEFORE A MEAL. 60 tablet 5  . imipramine (TOFRANIL) 50 MG tablet TAKE 2 TABLETS BY MOUTH AT BEDTIME 60 tablet 1  . lamoTRIgine (LAMICTAL) 25 MG tablet Take 50 mg by mouth at bedtime.   3  . losartan (COZAAR) 25 MG tablet Take 1 tablet (25 mg total) by mouth daily. 30 tablet 2  . metoprolol succinate (TOPROL-XL) 25 MG 24 hr tablet Take 1 tablet (25 mg total) by mouth daily. 30 tablet 2  . Multiple Vitamin (MULTIVITAMIN WITH MINERALS) TABS tablet Take 1 tablet by mouth daily.    Marland Kitchen MYRBETRIQ 50 MG TB24 tablet TAKE 1 TABLET (50 MG TOTAL) BY MOUTH DAILY. FOR BLADDER 30 tablet 2  . naproxen (NAPROSYN) 500 MG tablet TAKE 1 TABLET BY MOUTH TWICE A DAY WITH A MEAL 60 tablet 1  . nicotine (NICODERM CQ - DOSED IN MG/24 HOURS) 21 mg/24hr patch Place 1 patch (21 mg total) onto the skin daily. 28 patch 2  . omega-3 acid ethyl esters (LOVAZA) 1 g capsule Take 1 g by mouth 2 (two) times daily.    . polyethylene glycol powder (GLYCOLAX/MIRALAX) powder Take 17 g by mouth 2 (two) times daily as needed for moderate constipation. (Patient taking differently: Take 17 g by mouth every morning. ) 3350 g 1  . potassium chloride SA (K-DUR,KLOR-CON) 20 MEQ tablet Take 1 tablet (20 mEq total) by mouth daily. 30 tablet 1  . PROAIR HFA 108 (90 BASE) MCG/ACT inhaler INHALE 2 PUFFS INTO THE LUNGS EVERY 4 (FOUR) HOURS AS NEEDED FOR WHEEZING OR SHORTNESS OF BREATH. 8.5 Inhaler 2  . topiramate (TOPAMAX) 200 MG tablet Take 200 mg by mouth at bedtime.   2  . vitamin C (ASCORBIC ACID) 500 MG tablet Take 1,500 mg by mouth daily.    . ziprasidone (GEODON) 80 MG capsule Take 160 mg by mouth at bedtime.      No facility-administered medications prior to visit.      Allergies:   Apple; Penicillins; and Bee venom   Social History   Social History  .  Marital status: Divorced    Spouse name: N/A  . Number of children: N/A  . Years of education: N/A   Occupational History  . Unemployed    Social History Main Topics  . Smoking status: Current Some Day Smoker    Packs/day: 1.00    Years: 20.00    Types: Cigarettes  . Smokeless tobacco: Never Used     Comment:    . Alcohol use No  . Drug use: No  . Sexual  activity: Yes    Birth control/ protection: Surgical   Other Topics Concern  . None   Social History Narrative   Lives in [place], [place] with her father.      Family History:  The patient's family history includes Diabetes in her maternal grandmother; Heart attack in her maternal grandmother; Lung cancer in her father.   ROS:   Please see the history of present illness.    ROS All other systems reviewed and are negative.   PHYSICAL EXAM:   VS:  BP 96/64   Pulse 97   Ht 5\' 2"  (1.575 m)   Wt 168 lb (76.2 kg)   BMI 30.73 kg/m    GEN: Well nourished, well developed, in no acute distress  HEENT: normal  Neck: no JVD, carotid bruits, or masses Cardiac: RRR; no murmurs, rubs, or gallops,no edema  Respiratory:  clear to auscultation bilaterally, normal work of breathing GI: soft, nontender, nondistended, + BS MS: no deformity or atrophy  Skin: warm and dry, no rash Neuro:  Alert and Oriented x 3, Strength and sensation are intact Psych: euthymic mood, full affect  Wt Readings from Last 3 Encounters:  09/07/16 168 lb (76.2 kg)  09/04/16 171 lb (77.6 kg)  08/22/16 162 lb (73.5 kg)      Studies/Labs Reviewed:   EKG:  EKG is not ordered today.   Recent Labs: 08/05/2016: Hemoglobin 12.1 08/06/2016: TSH 1.460 08/08/2016: Magnesium 2.3 09/04/2016: ALT 37; BNP 23.2; BUN 14; Creatinine, Ser 0.59; Platelets 226; Potassium 5.0; Sodium 142   Lipid Panel    Component Value Date/Time   CHOL 157 08/06/2016 0243   CHOL 220 (H) 11/03/2013 1519   TRIG 261 (H) 08/06/2016 0243   TRIG 359 (H) 07/30/2014 0910   HDL 25 (L)  08/06/2016 0243   HDL 33 (L) 07/30/2014 0910   CHOLHDL 6.3 08/06/2016 0243   VLDL 52 (H) 08/06/2016 0243   LDLCALC 80 08/06/2016 0243   LDLCALC Comment 11/03/2013 1519   LDLCALC 69 03/30/2013 1219    Additional studies/ records that were reviewed today include:   Echo 08/06/2016 LV EF: 40% -   45%  Study Conclusions  - Left ventricle: The cavity size was normal. Wall thickness was   normal. Systolic function was mildly to moderately reduced. The   estimated ejection fraction was in the range of 40% to 45%.   Diffuse hypokinesis. - Mitral valve: There was mild regurgitation. - Pulmonary arteries: Systolic pressure was mildly increased. PA   peak pressure: 31 mm Hg (S).     Cath 08/08/2016 Conclusion     Mid RCA lesion, 10 %stenosed.  LV end diastolic pressure is normal.  There is mild to moderate left ventricular systolic dysfunction.  There is no mitral valve regurgitation.   1. Near normal coronary arteries with no evidence of obstructive disease. 2. Mild to moderately reduced LV systolic function with an ejection fraction of 40% with global hypokinesis. 3. Normal left ventricular end-diastolic pressure.  Recommendations: Continue medical therapy for nonischemic cardiomyopathy. The patient does not require further intravenous diuresis and I switched her to small dose oral furosemide. Right heart catheterization was planned. However, it could not be done via the right antecubital area due to cellulitis and the wire could not be advanced via the left antecubital vein. Given that her LVEDP was normal, I elected not to pursue a right heart catheterization. There was no evidence of pulmonary hypertension on echo.       ASSESSMENT:  1. NICM (nonischemic cardiomyopathy) ([place])   2. Chest pain, unspecified type      PLAN:  In order of problems listed above:  1. NICM: Unclear cause, EF 40%. Cardiac catheterization negative. Continue on losartan and Toprol-XL.  We discussed heart failure prevention plan including tobacco salt, limited total amount of fluid intake per day and daily weight regiment. Based on repeat laboratory obtained on 09/04/2016, her renal function and potassium are stable on current dose of diuretic.  2. Chest pain: Recently underwent cardiac catheterization which only showed 10% RCA disease, otherwise essentially normal coronaries. We'll hold off on additional workup at this time, unlikely be cardiac in origin.    Medication Adjustments/Labs and Tests Ordered: Current medicines are reviewed at length with the patient today.  Concerns regarding medicines are outlined above.  Medication changes, Labs and Tests ordered today are listed in the Patient Instructions below. Patient Instructions  Medication Instructions:  Your physician recommends that you continue on your current medications as directed. Please refer to the Current Medication list given to you today.  If you need a refill on your cardiac medications before your next appointment, please call your pharmacy.  Follow-Up: Your physician wants you to follow-up in: 3 MONTHS WITH DR [place] OFFICE.   Special Instructions: Heart failure prevention instruction: 1. Avoid salt 2. Limit fluid intake to < 2 liters 3. Weigh yourself every morning, call cardiology if weight increase by more than 3 lbs overnight or 5 lbs in a single week.    Thank you for choosing [facility] at [facility]!!    Dalayna Lauter, PA-C [place], LPN     Hilbert Corrigan, [place]  09/08/2016 10:36 AM    [address], [place], [place]  [address] Phone: [phone]; Fax: [phone]

## 2016-09-07 NOTE — Patient Instructions (Addendum)
Medication Instructions:  Your physician recommends that you continue on your current medications as directed. Please refer to the Current Medication list given to you today.  If you need a refill on your cardiac medications before your next appointment, please call your pharmacy.  Follow-Up: Your physician wants you to follow-up in: 3 MONTHS WITH DR [place] OFFICE.   Special Instructions: Heart failure prevention instruction: 1. Avoid salt 2. Limit fluid intake to < 2 liters 3. Weigh yourself every morning, call cardiology if weight increase by more than 3 lbs overnight or 5 lbs in a single week.    Thank you for choosing [facility] at [place]!!    Iyanni Hepp, PA-C [place], LPN

## 2016-09-08 ENCOUNTER — Encounter: Payer: Self-pay | Admitting: Physician Assistant

## 2016-09-10 ENCOUNTER — Other Ambulatory Visit: Payer: Self-pay | Admitting: *Deleted

## 2016-09-10 ENCOUNTER — Ambulatory Visit: Payer: Medicare Other | Admitting: Physician Assistant

## 2016-09-10 ENCOUNTER — Other Ambulatory Visit: Payer: Self-pay | Admitting: Pharmacist

## 2016-09-10 NOTE — Patient Outreach (Signed)
Telephone call to pt for transition of care week 4 and Red EMMI flags for new, worsening problems, diarrhea, nausea, chills, fever on 09/07/16. Spoke with pt, HIPAA verified, pt reports " I had a stomach bug for 24 hours and it's better now"  Weight today 159 pounds, saw cardiologist Friday 09/07/16, pt reports she has all medications and taking as prescribed, pt states she is going to her primary MD office to show documentation of being in the hospital and this is the reason pt missed MD appointment.  Pt states if she is discharged from the practice, she will start looking for new primary MD and her sister is assisting her.  Pt states her sister is staying with her.   THN CM Care Plan Problem One     Most Recent Value  Care Plan Problem One  Knowledge deficit related to CHF  Role Documenting the Problem One  Care Management Coordinator  Care Plan for Problem One  Active  THN Long Term Goal (31-90 days)  Pt will verbalize better understanding of CHF and avoid readmission within 90 days aeb improved self care  [facility] Long Term Goal Start Date  08/20/16  Interventions for Problem One Long Term Goal  RN CM reviewed instructions from patient's cardiologist visit on 09/07/16- low sodium diet, <2 liters day fluids, continue to weigh daily  THN CM Short Term Goal #1 (0-30 days)  Pt will verbalize CHF zones/ action plan within 30 days.  THN CM Short Term Goal #1 Start Date  08/20/16  Interventions for Short Term Goal #1  RN CM reiterated CHF zones/ action plan and importance of symptom management, calling MD early for change in health status  THN CM Short Term Goal #2 (0-30 days)  Pt will verbalize better understanding of low salt diet within 30 days.  THN CM Short Term Goal #2 Start Date  08/20/16  Interventions for Short Term Goal #2  RN CM reviewed low sodium, heart healthy diet and importance of proper food choices, gave copy of low sodium diet poster and reviewed with pt.      PLAN See pt for home visit  this week  Jacqlyn Larsen [facility], [place] Coordinator [phone]

## 2016-09-10 NOTE — Patient Outreach (Signed)
[facility]) Care Management  09/10/2016  Darlene Wilcox May 20, 1961 [id number]   Patient was called to follow up after her Cardiology appointment. HIPAA identifiers were obtained.  Patient's chart was reviewed and patient confirmed, her medications were not changed during the cardiology visit with Dr. Eulas Post.  She also confirmed Dr. [name] is no longer her PCP due to missed appointments. A note was sent to [place], Haynes Kerns to request a list of Primary Care Providers close to the patient's zip code be sent to her home,  Patient said she would review the list and choose a new provider.    I will follow up with her via telephone within one week.  Elayne Guerin, PharmD, [place] Clinical Pharmacist [phone]

## 2016-09-12 ENCOUNTER — Other Ambulatory Visit: Payer: Self-pay | Admitting: Family Medicine

## 2016-09-12 ENCOUNTER — Encounter: Payer: Self-pay | Admitting: *Deleted

## 2016-09-12 ENCOUNTER — Other Ambulatory Visit: Payer: Self-pay | Admitting: *Deleted

## 2016-09-12 NOTE — Patient Outreach (Signed)
[facility]) Care Management   09/12/2016  Darlene Wilcox November 07, 1960 [id number]  Darlene Wilcox is an 56 y.o. female  Subjective: Routine home visit with pt, HIPAA verified, pt reports she got a free blood pressure cuff from new pharmacy and is checking blood pressure and will start logging. Pt states she is down to 1-2 cigarettes daily and wearing nicoderm patch.  Pt states she has been discharged from primary MD Dr. [name] office but they will continue to fill medications for 2- 3 months.  Pt states no to new or worsening problems from 09/11/16 Red EMMI flag.  Pt sister lives with her all the time, mobile crisis sending pt a necklace to wear so she will be able to call at any time.  Pt reports [facility] pharmacist has list for [facility] doctors being mailed to her home.     Objective:   Vitals:   09/12/16 1216  BP: (!) 102/58  Pulse: 88  Resp: 18  SpO2: 95%  Weight: 158 lb (71.7 kg)   ROS  Physical Exam  Constitutional: She is oriented to person, place, and time. She appears well-developed and well-nourished.  HENT:  Head: Normocephalic.  Neck: Normal range of motion. Neck supple.  Cardiovascular: Normal rate and regular rhythm.   Respiratory: Effort normal and breath sounds normal.  Dyspnea with exertion when walking up steps  GI: Soft. Bowel sounds are normal.  Musculoskeletal: Normal range of motion. She exhibits no edema.  Neurological: She is alert and oriented to person, place, and time.  Skin: Skin is warm and dry.  Psychiatric: She has a normal mood and affect. Her behavior is normal. Judgment and thought content normal.  Pt forgetful at times    Encounter Medications:   Outpatient Encounter Prescriptions as of 09/12/2016  Medication Sig Note  . Blood Pressure Monitoring (BLOOD PRESS MONITOR/M-L CUFF) MISC 1 each by Does not apply route daily.   . Calcium Citrate-Vitamin D (CALCIUM CITRATE + D3 PO) Take 1 tablet by mouth daily.   . clonazePAM (KLONOPIN) 1  MG tablet Take 1 tablet (1 mg total) by mouth 3 (three) times daily.   . diclofenac sodium (VOLTAREN) 1 % GEL Apply 2 g topically 4 (four) times daily. 08/22/2016: Needs a prior authorization  . esomeprazole (NEXIUM) 40 MG capsule TAKE ONE CAPSULE BY MOUTH EVERY DAY   . FLUoxetine (PROZAC) 40 MG capsule Take 80 mg by mouth daily.    . fluticasone (FLOVENT HFA) 110 MCG/ACT inhaler Inhale 2 puffs into the lungs 2 (two) times daily.   . furosemide (LASIX) 40 MG tablet Take 1 tablet (40 mg total) by mouth daily.   Marland Kitchen gemfibrozil (LOPID) 600 MG tablet TAKE 1 TABLET (600 MG TOTAL) BY MOUTH 2 (TWO) TIMES DAILY BEFORE A MEAL.   Marland Kitchen imipramine (TOFRANIL) 50 MG tablet TAKE 2 TABLETS BY MOUTH AT BEDTIME   . lamoTRIgine (LAMICTAL) 25 MG tablet Take 50 mg by mouth at bedtime.    Marland Kitchen losartan (COZAAR) 25 MG tablet Take 1 tablet (25 mg total) by mouth daily.   . metoprolol succinate (TOPROL-XL) 25 MG 24 hr tablet Take 1 tablet (25 mg total) by mouth daily.   . Multiple Vitamin (MULTIVITAMIN WITH MINERALS) TABS tablet Take 1 tablet by mouth daily.   Marland Kitchen MYRBETRIQ 50 MG TB24 tablet TAKE 1 TABLET (50 MG TOTAL) BY MOUTH DAILY. FOR BLADDER   . naproxen (NAPROSYN) 500 MG tablet TAKE 1 TABLET BY MOUTH TWICE A DAY WITH A MEAL   .  nicotine (NICODERM CQ - DOSED IN MG/24 HOURS) 21 mg/24hr patch Place 1 patch (21 mg total) onto the skin daily.   Marland Kitchen omega-3 acid ethyl esters (LOVAZA) 1 g capsule Take 1 g by mouth 2 (two) times daily.   . polyethylene glycol powder (GLYCOLAX/MIRALAX) powder Take 17 g by mouth 2 (two) times daily as needed for moderate constipation. (Patient taking differently: Take 17 g by mouth every morning. )   . potassium chloride SA (K-DUR,KLOR-CON) 20 MEQ tablet Take 1 tablet (20 mEq total) by mouth daily.   Marland Kitchen PROAIR HFA 108 (90 BASE) MCG/ACT inhaler INHALE 2 PUFFS INTO THE LUNGS EVERY 4 (FOUR) HOURS AS NEEDED FOR WHEEZING OR SHORTNESS OF BREATH.   . topiramate (TOPAMAX) 200 MG tablet Take 200 mg by mouth at  bedtime.    . vitamin C (ASCORBIC ACID) 500 MG tablet Take 1,500 mg by mouth daily.   . ziprasidone (GEODON) 80 MG capsule Take 160 mg by mouth at bedtime.    . clotrimazole (MYCELEX) 10 MG troche Take 1 tablet by mouth 5 (five) times daily. (Dissolve 1 tablet five times daily)   . gabapentin (NEURONTIN) 300 MG capsule Take 300 mg by mouth 3 (three) times daily.     No facility-administered encounter medications on file as of 09/12/2016.     Functional Status:   In your present state of health, do you have any difficulty performing the following activities: 08/22/2016 08/05/2016  Hearing? N -  Vision? N -  Difficulty concentrating or making decisions? N -  Walking or climbing stairs? Y -  Dressing or bathing? N -  Doing errands, shopping? N N  Preparing Food and eating ? N -  Using the Toilet? N -  In the past six months, have you accidently leaked urine? N -  Do you have problems with loss of bowel control? N -  Managing your Medications? Y -  Managing your Finances? N -  Some recent data might be hidden    Fall/Depression Screening:    PHQ 2/9 Scores 09/04/2016 08/22/2016 08/15/2016 08/15/2016 06/28/2016 06/28/2016 05/08/2016  PHQ - 2 Score 4 4 4 4  0 0 1  PHQ- 9 Score 9 9 11 11  - - -    Assessment:  RN CM called [facility] in [place] on behalf of pt to inquire if they are accepting new patients, per Anderson Malta, they are and pt will need to come by their office and complete a new pt form and they will make a decision within a few days, pt verbalizes understanding and states she will do this.  RN CM sent update to [facility] pharmacist Denyse Amass regarding medications  ( pt had not been taking lasix and needed refill on gabapentin which was completed today)RN CM faxed primary MD Dr. [name] note reporting today's blood pressure. RN CM offered to assist pt with obtaining new primary MD if needed, pt states she will call RN CM if any problems with this process.  THN CM Care Plan Problem One      Most Recent Value  Care Plan Problem One  Knowledge deficit related to CHF  Role Documenting the Problem One  Care Management Coordinator  Care Plan for Problem One  Active  THN Long Term Goal (31-90 days)  Pt will verbalize better understanding of CHF and avoid readmission within 90 days aeb improved self care  [facility] Long Term Goal Start Date  08/20/16  Interventions for Problem One Long Term Goal  RN  CM reviewed importance of continued daily weights , reviewed importance of symptom managment, reviewed medications with pt.  THN CM Short Term Goal #1 (0-30 days)  Pt will verbalize CHF zones/ action plan within 30 days.  THN CM Short Term Goal #1 Start Date  09/12/16 [goal restarted]  Interventions for Short Term Goal #1  RN CM reviewed CHF zones/ action plan and importance of symptom management, calling MD early for change in health status, gave pt copy of CHF zones and reviewed , gave copy of "Know before you go", gave 24 hour nurse line magnet  THN CM Short Term Goal #2 (0-30 days)  Pt will verbalize better understanding of low salt diet within 30 days.  THN CM Short Term Goal #2 Start Date  09/12/16 [goal restarted]  Interventions for Short Term Goal #2  RN CM reinforced low sodium poster, heart healthy diet and importance of proper food choices      Plan: follow up with home visit in May Ask if pt has new primary care provider Collaborate with pharmacy  Jacqlyn Larsen [facility], [place] Coordinator [phone]

## 2016-09-13 ENCOUNTER — Encounter: Payer: Self-pay | Admitting: *Deleted

## 2016-09-13 ENCOUNTER — Other Ambulatory Visit: Payer: Self-pay | Admitting: *Deleted

## 2016-09-13 DIAGNOSIS — N819 Female genital prolapse, unspecified: Secondary | ICD-10-CM | POA: Diagnosis not present

## 2016-09-13 NOTE — Patient Outreach (Signed)
Red EMMI on 09/12/16 for new or worsening problems, lost interest in activites, telephone call to pt for follow up, pt states " that's not right, this is confusing and gets on my nerves, I would like it stopped"  RN CM sent in basket to Alycia Rossetti and requested to have Nathalie calls discontinued per pt request.  Jacqlyn Larsen [facility], BSN [place] Coordinator [phone]

## 2016-09-17 ENCOUNTER — Other Ambulatory Visit: Payer: Self-pay | Admitting: Pharmacist
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
## 2016-09-18 ENCOUNTER — Other Ambulatory Visit: Payer: Self-pay | Admitting: Pharmacist

## 2016-09-18 ENCOUNTER — Other Ambulatory Visit: Payer: Self-pay | Admitting: Family Medicine

## 2016-09-18 ENCOUNTER — Encounter: Payer: Self-pay | Admitting: Pharmacist

## 2016-09-18 NOTE — Patient Outreach (Signed)
[place] [facility]) Care Management  09/18/2016  [name] April 06, 1961 [id number]   Home visit completed at patient's home.  HIPAA identifiers were obtained. The purpose of today's visit was to facilitate getting the patient set up for pill/bubble packaging.    At the beginning of the visit, patient said someone from [facility] called her yesterday and she switched to [facility].  [employer] was called to check on [facility] being a preferred pharmacy.  The representative at the general [employer] number did not have any information on the patient.  The patient had the direct line of the representative who called her.  When I called the number, it went to voicemail.  I could not make out the name of the company from the message but it was not [facility] directly.  It is most likely a third party insurance agency.  [employer] was called.  I spoke with the pharmacist who reported the patient's profile had already been transferred from [facility] over the last week.  Pharmacist asked for an active medication list to be faxed to them at [phone].   My pharmacy technician, Wilhemena Durie printed the list and will send to [facility]'.  Pharmacist said he would call me if there were any issues.  They will call the patient with her copay amount and deliver to her home.  Patient is also establishing care with a new provider, Dr. Edrick Oh (he is in the [place]).  Plan:  1.  Follow up with [facility].  2.  Coordinate with [place] Nurse, Jacqlyn Larsen.  3.  Follow up with the patient in 1 week.  Elayne Guerin, PharmD, [place] Clinical Pharmacist [phone]

## 2016-09-19 ENCOUNTER — Other Ambulatory Visit: Payer: Self-pay | Admitting: Internal Medicine

## 2016-09-19 ENCOUNTER — Other Ambulatory Visit: Payer: Self-pay | Admitting: Family Medicine

## 2016-09-19 ENCOUNTER — Other Ambulatory Visit: Payer: Self-pay | Admitting: Pharmacist

## 2016-09-19 NOTE — Patient Outreach (Signed)
[place] [facility]) Care Management  09/19/2016  Darlene Wilcox February 22, 1961 [id number]  Received a call from [facility] in [place]. The pharmacist confirmed the following medications were provided to the patient in pill packaging with the exceptions of the prescriptions that needed more information or were not able to be refilled per the insurance.  Ascorbic acid Calcium Citrate Fluoxetine  Flovent Furosemide Gemfibrozil Lamotrigine Losartan -no Metoprolol Myrbetric-no Multivitamin Naproxen Ziprasidone Potassium-no-Dr. [name] faxed Topiramate -need new scripts from Dr. Oneita Jolly new scripts from Dr. Franchot Mimes Nexium-was just filled for a 3 month supply Voltaren gel Lovaza-needs a new prescription by pharmacy Gabapentin-was prescribed by a specialist.  The pharmacy called them but they would not refill.  Patient will need to get with her PCP.   Plan:  Follow up with the patient on the pill packaging within the next week.   Elayne Guerin, PharmD, [place] Clinical Pharmacist [phone]

## 2016-09-24 ENCOUNTER — Ambulatory Visit: Payer: Self-pay | Admitting: Pharmacist

## 2016-09-25 ENCOUNTER — Other Ambulatory Visit: Payer: Self-pay | Admitting: Pharmacist

## 2016-09-25 NOTE — Patient Outreach (Signed)
[place] [facility]) Care Management  09/25/2016  BRIANDA BEITLER 01-Mar-1961 [id number]   Patient was called to follow up on the pill packs for her medications.  HIPAA identifiers were obtained. Patient confirmed she received her medications in the pill packs from [place].  Her sister Karna Christmas is helping her and she reported the packs are working well.  Patient said she has been weighing daily. Her weight today was 164.  Patient reported her weight has progressively increased over the past two weeks. During her home visit with [facility] Jacqlyn Larsen on 09/12/16, the patient weighed 158 pounds.  Patient said she has not been following the diet plan given to her by her cardiologist.    She reported having a few [place] soft drinks and eating fast food.    Patient was encouraged to call her Cardiologist and let them know about her weight gain.  In addition, she was encouraged to continue to watch her diet, especially her intake of soft drinks and fast food.  Plan:  1.  Coordinate with patient's [facility] Nurse, Jacqlyn Larsen.  2.  Call patient back in 1 week .  3.  Close pharmacy case since she now has pill packs.  Elayne Guerin, PharmD, [place] Clinical Pharmacist [phone]

## 2016-10-02 ENCOUNTER — Other Ambulatory Visit: Payer: Self-pay | Admitting: Pharmacist

## 2016-10-02 NOTE — Patient Outreach (Addendum)
[facility]) Care Management  10/02/2016  KARRINGTON MCCRAVY 1960-07-09 [id number]   Patient was called regarding medication adherence and to follow up on pill packaging from [facility].  Unfortunately, the patient did not answer the phone.  HIPAA compliant message left on her voicemail.   Plan:  1.  Call patient back in 5-7 days.  2.  Close Pharmacy case if no other issues.  Elayne Guerin, PharmD, BCACP [facility] Clinical Pharmacist [phone]  ADDENDUM:  Patient called me back.  HIPAA identifiers were obtained. Patient reported she was doing fine and is enjoying the pill packed medication from [facility]  [place] Nurse, Jacqlyn Larsen will be going to the patient's home this week and will look at the pill packs.  I will follow up with the patient next week and close the pharmacy case if all is well.  Elayne Guerin, PharmD, [place] Clinical Pharmacist [phone]

## 2016-10-04 ENCOUNTER — Other Ambulatory Visit: Payer: Self-pay | Admitting: *Deleted

## 2016-10-04 ENCOUNTER — Encounter: Payer: Self-pay | Admitting: *Deleted

## 2016-10-04 NOTE — Patient Outreach (Signed)
[name]) Care Management   10/04/2016  Darlene Wilcox 06/27/1960 [id number]  Darlene Wilcox is an 56 y.o. female  Subjective: Routine home visit with pt, HIPAA verified, pt reports she is weighing daily and gained 4 pounds in one month, pt states " I'm eating more but eating healthy, I'm eating no salt" Smoking 1-2 cigarettes daily and wears patch " most of time"  [facility] delivered prepackaged medications and pt states " this makes taking medicine so much easier"  Pt states she never checked into getting new primary MD although [facility] RN CM gave information on several practices for pt to choose from, pt states she plans to go to [facility] in [place] and complete the paperwork as they are accepting new patients.    Objective:   Vitals:   10/04/16 1234  BP: 104/60  Pulse: 86  Resp: 18  SpO2: 97%  Weight: 162 lb (73.5 kg)   ROS  Physical Exam  Vitals reviewed. Constitutional: She is oriented to person, place, and time. She appears well-developed and well-nourished.  HENT:  Head: Normocephalic.  Neck: Normal range of motion. Neck supple.  Cardiovascular: Normal rate and regular rhythm.   Respiratory: Effort normal and breath sounds normal.  GI: Soft. Bowel sounds are normal. She exhibits no mass.  Musculoskeletal: Normal range of motion. She exhibits no edema.  Neurological: She is alert and oriented to person, place, and time.  Skin: Skin is warm and dry.  Psychiatric: She has a normal mood and affect. Her behavior is normal. Thought content normal.  Forgetful at times    Encounter Medications:   Outpatient Encounter Prescriptions as of 10/04/2016  Medication Sig Note  . ascorbic acid (VITAMIN C) 500 MG tablet TAKE (3) TABLETS BY MOUTH ONCE DAILY.   Marland Kitchen Blood Pressure Monitoring (BLOOD PRESS MONITOR/M-L CUFF) MISC 1 each by Does not apply route daily.   . Calcium Citrate-Vitamin D (CALCIUM CITRATE + D3 PO) Take 1 tablet by mouth daily.   . clonazePAM (KLONOPIN)  1 MG tablet Take 1 tablet (1 mg total) by mouth 3 (three) times daily.   . diclofenac sodium (VOLTAREN) 1 % GEL APPLY 2 GRAMS TO AFFECTED AREAS(S) 4 TIMES DAILY.   Marland Kitchen esomeprazole (NEXIUM) 40 MG capsule TAKE 1 CAPSULE BY MOUTH ONCE A DAY.   Marland Kitchen FLOVENT HFA 110 MCG/ACT inhaler INHALE 2 PUFFS TWICE DAILY.   . furosemide (LASIX) 40 MG tablet Take 1 tablet (40 mg total) by mouth daily.   Marland Kitchen gemfibrozil (LOPID) 600 MG tablet TAKE 1 TABLET (600 MG TOTAL) BY MOUTH 2 (TWO) TIMES DAILY BEFORE A MEAL.   Marland Kitchen imipramine (TOFRANIL) 50 MG tablet TAKE 2 TABLETS BY MOUTH AT BEDTIME. 10/04/2016: Being delivered today  . lamoTRIgine (LAMICTAL) 25 MG tablet Take 50 mg by mouth at bedtime.    Marland Kitchen losartan (COZAAR) 25 MG tablet Take 1 tablet (25 mg total) by mouth daily.   . metoprolol succinate (TOPROL-XL) 25 MG 24 hr tablet Take 1 tablet (25 mg total) by mouth daily.   . Multiple Vitamin (MULTIVITAMIN WITH MINERALS) TABS tablet Take 1 tablet by mouth daily.   . naproxen (NAPROSYN) 500 MG tablet TAKE (1) TABLET BY MOUTH TWICE A DAY WITH MEALS.   . nicotine (NICODERM CQ - DOSED IN MG/24 HOURS) 21 mg/24hr patch Place 1 patch (21 mg total) onto the skin daily.   Marland Kitchen omega-3 acid ethyl esters (LOVAZA) 1 g capsule TAKE 1 CAPSULE BY MOUTH TWICE DAILY.   Marland Kitchen polyethylene  glycol powder (GLYCOLAX/MIRALAX) powder MIX (1 CAPFUL) 17 GRAMS IN 8 OZ OF JUICE OR WATER AND DRINK TWICE A DAY AS NEEDED FOR CONSTIPATION.   Marland Kitchen PROAIR HFA 108 (90 Base) MCG/ACT inhaler INHALE 2 PUFFS INTO THE LUNGS EVERY 4 HOURS AS NEEDED FOR WHEEZING ORSHORTNESS OF BREATH.   . topiramate (TOPAMAX) 200 MG tablet Take 200 mg by mouth at bedtime.    . ziprasidone (GEODON) 80 MG capsule Take 160 mg by mouth at bedtime.    . clotrimazole (MYCELEX) 10 MG troche Take 1 tablet by mouth 5 (five) times daily. (Dissolve 1 tablet five times daily)   . FLUoxetine (PROZAC) 40 MG capsule Take 80 mg by mouth daily.    Marland Kitchen gabapentin (NEURONTIN) 300 MG capsule TAKE 1 CAPSULE AT BEDTIME  X3DAYS, INCREASE TO 1 CAP TWICE A DAY X3DAYS, THEN 1 CAP THREE TIMES A DAY (Patient not taking: Reported on 10/04/2016)   . MYRBETRIQ 50 MG TB24 tablet TAKE 1 TABLET (50 MG TOTAL) BY MOUTH DAILY. FOR BLADDER (Patient not taking: Reported on 10/04/2016)   . potassium chloride SA (K-DUR,KLOR-CON) 20 MEQ tablet TAKE (1) TABLET BY MOUTH ONCE DAILY. (Patient not taking: Reported on 10/04/2016)    No facility-administered encounter medications on file as of 10/04/2016.     Functional Status:   In your present state of health, do you have any difficulty performing the following activities: 08/22/2016 08/05/2016  Hearing? N -  Vision? N -  Difficulty concentrating or making decisions? N -  Walking or climbing stairs? Y -  Dressing or bathing? N -  Doing errands, shopping? N N  Preparing Food and eating ? N -  Using the Toilet? N -  In the past six months, have you accidently leaked urine? N -  Do you have problems with loss of bowel control? N -  Managing your Medications? Y -  Managing your Finances? N -  Some recent data might be hidden    Fall/Depression Screening:    Fall Risk  09/12/2016 09/04/2016 08/22/2016  Falls in the past year? Yes Yes Yes  Number falls in past yr: 1 1 1   Injury with Fall? Yes Yes Yes  Risk Factor Category  - - -  Risk for fall due to : Medication side effect History of fall(s) History of fall(s)  Risk for fall due to (comments): - - -  Follow up Education provided Education provided;Falls prevention discussed Education provided;Falls prevention discussed   PHQ 2/9 Scores 09/04/2016 08/22/2016 08/15/2016 08/15/2016 06/28/2016 06/28/2016 05/08/2016  PHQ - 2 Score 4 4 4 4  0 0 1  PHQ- 9 Score 9 9 11 11  - - -    Assessment:  RN CM ask pt to obtain primary MD as soon as possible because former primary MD will not continue filling medications (pt states for 3 months).  [name] has offered to take her to practices to complete paperwork, pt has not done this yet  unfortunately.  RN CM observed prefilled med packs from [place] and there a few medications missing and pt still has bottles for some of the medication that she is still taking.  RN CM sent in basket to [place] pharmacist reporting which medications are missing, reported case closure for RN CM and that pt has no primary MD, RN CM called [facility] practice in  [facility] for pt at last home visit and they instructed her of coming into their practice for completion of paperwork, this has to be done  in person, not over the phone. Pt verbalizes understanding of discharge and that to participate in [facility] program in the future, she will need to have [facility] MD.  RN CM faxed case closure letter to [place] (former primary MD)  [facility] CM Care Plan Problem One     Most Recent Value  Care Plan Problem One  Knowledge deficit related to CHF  Role Documenting the Problem One  Care Management [place] for Problem One  Active  [facility] Long Term Goal (31-90 days)  Pt will verbalize better understanding of CHF and avoid readmission within 90 days aeb improved self care  [facility] Long Term Goal Start Date  08/20/16  [facility] Long Term Goal Met Date  10/04/16 [pt has had no readmissions]  Interventions for Problem One Long Term Goal  RN CM reviewed and reinforced importance of continued daily weights , reviewed importance of symptom managment, reviewed medications with pt., observed prepackaged meds from [place] CM Short Term Goal #1 (0-30 days)  Pt will verbalize CHF zones/ action plan within 30 days.  [facility] CM Short Term Goal #1 Start Date  09/12/16 [goal restarted]  [facility] CM Short Term Goal #1 Met Date  10/04/16  Interventions for Short Term Goal #1  RN CM reviewed CHF zones/ action plan with pt utilizing teach back  [facility] CM Short Term Goal #2 (0-30 days)  Pt will verbalize better understanding of low salt diet within 30 days.  [facility] CM Short Term Goal #2 Start Date  09/12/16 [goal  restarted]  [facility] CM Short Term Goal #2 Met Date  10/04/16  Interventions for Short Term Goal #2  RN CM reinforced low sodium diet and food choices      Plan: discharge from community nursing today Pharmacist still involved  Jacqlyn Larsen [facility] [place] Coordinator [phone]

## 2016-10-05 ENCOUNTER — Telehealth: Payer: Self-pay | Admitting: Pharmacist

## 2016-10-05 ENCOUNTER — Other Ambulatory Visit: Payer: Self-pay | Admitting: Internal Medicine

## 2016-10-05 NOTE — Patient Outreach (Signed)
[facility]) Care Management  10/05/2016  Darlene Wilcox April 01, 1961 [id number]   [facility] Community Nurse, Jacqlyn Larsen sent an inbasket message about the patient's medications.  She said while completing a home visit the patient did not have the following medications in her pill pack from [place]:  "Gabapentin, potassium, myrbetriq, prozac, nexium and pt states she's not taking these. These aren't in bubble pack but pt has bottle for these and is taking- imipramine, losartan, klonopin. "  [facility] was called for understanding:   Gabapentin- did not have refills and they call the prescribing physician,  Dr. Carlena Bjornstad, and she denied the refill.  Losartan, clonazepam, and imipramine--were put in bottles because it was too early to fill them per the insurance.  Myrbetriq, Potassium, Fluoxetine, and lansoprazole were too early when the pill pack was originally filled.  Pharmacist said they would fill a 2 weeks supply in bottles and hopefully everything will be able to go into the pill pack for the end of the month.  Elayne Guerin, PharmD, [place] Clinical Pharmacist [phone]

## 2016-10-08 ENCOUNTER — Other Ambulatory Visit: Payer: Self-pay | Admitting: Nurse Practitioner

## 2016-10-08 ENCOUNTER — Other Ambulatory Visit: Payer: Self-pay | Admitting: Internal Medicine

## 2016-10-08 ENCOUNTER — Other Ambulatory Visit: Payer: Self-pay | Admitting: Family Medicine

## 2016-10-09 ENCOUNTER — Other Ambulatory Visit: Payer: Self-pay | Admitting: Pharmacist

## 2016-10-09 NOTE — Patient Outreach (Signed)
[name]) Care Management  10/09/2016  Darlene Wilcox 17-Jan-1961 [id number]   Patient was called to inform her about some of the discrepancies with her pill packaging.  [place] was called and the following information was discovered:  Gabapentin- did not have refills and they call the prescribing physician,  Dr. Carlena Bjornstad, and she denied the refill.  Losartan, clonazepam, and imipramine--were put in bottles because it was too early to fill them per the insurance.  Myrbetriq, Potassium, Fluoxetine, and lansoprazole were too early when the pill pack was originally filled.  Pharmacist said they would fill a 2 weeks supply in bottles and hopefully everything will be able to go into the pill pack for the end of the month.  Unfortunately, the patient did not answer the phone.  HIPAA compliant message was left on the patient's voicemail.   Plan;  1.  I will recall the patient in within the next 7-10 business days.    Elayne Guerin, PharmD, [place] Clinical Pharmacist [phone]

## 2016-10-10 ENCOUNTER — Other Ambulatory Visit: Payer: Self-pay | Admitting: Family Medicine

## 2016-10-11 ENCOUNTER — Other Ambulatory Visit: Payer: Self-pay | Admitting: Internal Medicine

## 2016-10-12 ENCOUNTER — Other Ambulatory Visit: Payer: Self-pay | Admitting: Pharmacist

## 2016-10-12 NOTE — Patient Outreach (Signed)
[facility]) Care Management  10/12/2016  Darlene Wilcox 15-Apr-1961 [id number]   Patient called to let me know she will be going to [facility] to see her new provider.  HIPAA identifiers were obtained.  Patient had been released from Dr. [name] office and did not have a PCP.  She reported completing the new patient information forms and is awaiting a call back from [facility].  She also reported getting her refills from [facility] . She has been switched to pill packing but will have to wait another couple of weeks before everything is synced correctly.  Patient said she has all of her medications for know (most are in the pill pack but those that were too early before have been partially filled in bottles).  Elayne Guerin, PharmD, [place] Clinical Pharmacist [phone]   Plan:  Follow up with the patient in 1 week   Close pharmacy case after all prescriptions have been put in the packing system.  Elayne Guerin, PharmD, [place] Clinical Pharmacist [phone]

## 2016-10-24 ENCOUNTER — Other Ambulatory Visit: Payer: Self-pay | Admitting: Family Medicine

## 2016-10-25 NOTE — Telephone Encounter (Signed)
Pt advised we would not be able to provide refills on her medications as it has been well over the 30 days since she was discharged from the practice. Pt voiced understanding.

## 2016-10-31 ENCOUNTER — Other Ambulatory Visit: Payer: Self-pay | Admitting: Pharmacist

## 2016-10-31 NOTE — Patient Outreach (Signed)
[place]) Care Management  10/31/2016  Darlene Wilcox 20-May-1961 [id number]  Patient was called to follow up on her getting established with her new provider at [place]. Unfortunately, she did not answer the phone. HIPAA compliant message left on the patient's voicemail.   [name] was called on the patient's behalf.  Spoke with the pharmacist, Nita Sells who confirmed the patient was able to get her prescriptions filled because Dr. Artemio Aly office provided one refill until she is able to get established with [facility].  Plan:  I will call the patient back in 1-2 weeks to see if she was able to schedule an appointment.  Close pharmacy case once patient is established with new PCP as she is already receiving her medications via pill pack and she has extra help.  Elayne Guerin, PharmD, [place] Clinical Pharmacist [phone]

## 2016-11-07 ENCOUNTER — Other Ambulatory Visit: Payer: Self-pay | Admitting: Pharmacist

## 2016-11-07 ENCOUNTER — Encounter: Payer: Self-pay | Admitting: Pharmacist

## 2016-11-07 NOTE — Patient Outreach (Signed)
[place] [facility]) Care Management  11/07/2016  Darlene Wilcox 06/14/1960 [id number]  Patient called to follow up on choosing a PCP.  Currently, the patient does not have a PCP and as such is not eligible for Care Management Services with [facility]  HIPAA identifiers were obtained.    Patient was sent a list of [facility] Providers over a month ago and has not been able to successfully obtain a new PCP within the [place].  The patient said [facility] would not take her as a patient and they did not give a reason.  Patient also said several practices in her area turned her down because she has Medicaid. (She is dually-eligible).  Patient reported she has an appointment with her new provider on November 22, 2016.  Patient's new provider is not a [facility] provider and is with [facility].  Patient has enough medication to last her until her appointment and understands she needs to instruct her new provider to call her medications to [facility] in [place] where she has an account and they deliver her medications to her in pill packing.  Plan:  Pharmacy case will be closed. (Care management case has already been closed)  Patient will be sent a letter.  Elayne Guerin, PharmD, [place] Clinical Pharmacist [phone]

## 2016-11-14 ENCOUNTER — Ambulatory Visit: Payer: Medicare Other | Admitting: Pharmacist

## 2016-12-24 ENCOUNTER — Ambulatory Visit: Payer: Medicare Other | Admitting: Cardiovascular Disease

## 2017-01-29 DIAGNOSIS — I428 Other cardiomyopathies: Secondary | ICD-10-CM | POA: Insufficient documentation

## 2017-01-29 DIAGNOSIS — J4540 Moderate persistent asthma, uncomplicated: Secondary | ICD-10-CM | POA: Insufficient documentation

## 2017-01-29 DIAGNOSIS — F17210 Nicotine dependence, cigarettes, uncomplicated: Secondary | ICD-10-CM | POA: Insufficient documentation

## 2017-01-31 ENCOUNTER — Encounter (HOSPITAL_COMMUNITY): Payer: Self-pay

## 2017-01-31 ENCOUNTER — Inpatient Hospital Stay (HOSPITAL_COMMUNITY): Payer: Medicare HMO

## 2017-01-31 ENCOUNTER — Inpatient Hospital Stay (HOSPITAL_COMMUNITY)
Admission: EM | Admit: 2017-01-31 | Discharge: 2017-02-03 | DRG: 872 | Disposition: A | Discharge status: Home / Self Care | Payer: Medicare HMO | Attending: Family Medicine | Admitting: Family Medicine

## 2017-01-31 ENCOUNTER — Emergency Department (HOSPITAL_COMMUNITY): Payer: Medicare HMO

## 2017-01-31 DIAGNOSIS — N179 Acute kidney failure, unspecified: Secondary | ICD-10-CM | POA: Insufficient documentation

## 2017-01-31 DIAGNOSIS — M545 Low back pain: Secondary | ICD-10-CM | POA: Diagnosis present

## 2017-01-31 DIAGNOSIS — Z91030 Bee allergy status: Secondary | ICD-10-CM

## 2017-01-31 DIAGNOSIS — I5022 Chronic systolic (congestive) heart failure: Secondary | ICD-10-CM | POA: Diagnosis present

## 2017-01-31 DIAGNOSIS — R569 Unspecified convulsions: Secondary | ICD-10-CM | POA: Diagnosis present

## 2017-01-31 DIAGNOSIS — M549 Dorsalgia, unspecified: Secondary | ICD-10-CM

## 2017-01-31 DIAGNOSIS — F1210 Cannabis abuse, uncomplicated: Secondary | ICD-10-CM | POA: Diagnosis present

## 2017-01-31 DIAGNOSIS — Z981 Arthrodesis status: Secondary | ICD-10-CM

## 2017-01-31 DIAGNOSIS — F17210 Nicotine dependence, cigarettes, uncomplicated: Secondary | ICD-10-CM | POA: Diagnosis present

## 2017-01-31 DIAGNOSIS — Z880 Allergy status to penicillin: Secondary | ICD-10-CM | POA: Diagnosis not present

## 2017-01-31 DIAGNOSIS — R32 Unspecified urinary incontinence: Secondary | ICD-10-CM | POA: Diagnosis present

## 2017-01-31 DIAGNOSIS — I429 Cardiomyopathy, unspecified: Secondary | ICD-10-CM | POA: Diagnosis present

## 2017-01-31 DIAGNOSIS — D649 Anemia, unspecified: Secondary | ICD-10-CM

## 2017-01-31 DIAGNOSIS — A419 Sepsis, unspecified organism: Principal | ICD-10-CM | POA: Diagnosis present

## 2017-01-31 DIAGNOSIS — K219 Gastro-esophageal reflux disease without esophagitis: Secondary | ICD-10-CM | POA: Diagnosis present

## 2017-01-31 DIAGNOSIS — R195 Other fecal abnormalities: Secondary | ICD-10-CM | POA: Diagnosis present

## 2017-01-31 DIAGNOSIS — R680 Hypothermia, not associated with low environmental temperature: Secondary | ICD-10-CM | POA: Diagnosis present

## 2017-01-31 DIAGNOSIS — E860 Dehydration: Secondary | ICD-10-CM | POA: Diagnosis present

## 2017-01-31 DIAGNOSIS — F1410 Cocaine abuse, uncomplicated: Secondary | ICD-10-CM

## 2017-01-31 DIAGNOSIS — Z79899 Other long term (current) drug therapy: Secondary | ICD-10-CM

## 2017-01-31 DIAGNOSIS — I110 Hypertensive heart disease with heart failure: Secondary | ICD-10-CM | POA: Diagnosis present

## 2017-01-31 DIAGNOSIS — Z23 Encounter for immunization: Secondary | ICD-10-CM | POA: Diagnosis present

## 2017-01-31 DIAGNOSIS — J45909 Unspecified asthma, uncomplicated: Secondary | ICD-10-CM | POA: Diagnosis present

## 2017-01-31 DIAGNOSIS — F1021 Alcohol dependence, in remission: Secondary | ICD-10-CM | POA: Diagnosis present

## 2017-01-31 DIAGNOSIS — Z91018 Allergy to other foods: Secondary | ICD-10-CM

## 2017-01-31 DIAGNOSIS — E785 Hyperlipidemia, unspecified: Secondary | ICD-10-CM | POA: Diagnosis present

## 2017-01-31 DIAGNOSIS — F319 Bipolar disorder, unspecified: Secondary | ICD-10-CM | POA: Diagnosis present

## 2017-01-31 DIAGNOSIS — T50901A Poisoning by unspecified drugs, medicaments and biological substances, accidental (unintentional), initial encounter: Secondary | ICD-10-CM | POA: Diagnosis present

## 2017-01-31 DIAGNOSIS — R4781 Slurred speech: Secondary | ICD-10-CM

## 2017-01-31 DIAGNOSIS — I959 Hypotension, unspecified: Secondary | ICD-10-CM

## 2017-01-31 DIAGNOSIS — F410 Panic disorder [episodic paroxysmal anxiety] without agoraphobia: Secondary | ICD-10-CM | POA: Diagnosis present

## 2017-01-31 DIAGNOSIS — F419 Anxiety disorder, unspecified: Secondary | ICD-10-CM | POA: Diagnosis present

## 2017-01-31 DIAGNOSIS — G8929 Other chronic pain: Secondary | ICD-10-CM | POA: Diagnosis present

## 2017-01-31 HISTORY — DX: Bronchitis, not specified as acute or chronic: J40

## 2017-01-31 HISTORY — DX: Tobacco use: Z72.0

## 2017-01-31 LAB — COMPREHENSIVE METABOLIC PANEL
ALK PHOS: 63 U/L (ref 38–126)
ALT: 10 U/L — AB (ref 14–54)
ALT: 9 U/L — ABNORMAL LOW (ref 14–54)
ANION GAP: 10 (ref 5–15)
AST: 16 U/L (ref 15–41)
AST: 15 U/L (ref 15–41)
Albumin: 3.1 g/dL — ABNORMAL LOW (ref 3.5–5.0)
Albumin: 3.1 g/dL — ABNORMAL LOW (ref 3.5–5.0)
Alkaline Phosphatase: 77 U/L (ref 38–126)
Anion gap: 9 (ref 5–15)
BILIRUBIN TOTAL: 0.9 mg/dL (ref 0.3–1.2)
BILIRUBIN TOTAL: 0.7 mg/dL (ref 0.3–1.2)
BUN: 35 mg/dL — ABNORMAL HIGH (ref 6–20)
BUN: 33 mg/dL — ABNORMAL HIGH (ref 6–20)
CALCIUM: 8.5 mg/dL — AB (ref 8.9–10.3)
CO2: 21 mmol/L — AB (ref 22–32)
CO2: 21 mmol/L — ABNORMAL LOW (ref 22–32)
CREATININE: 1.61 mg/dL — AB (ref 0.44–1.00)
Calcium: 8.5 mg/dL — ABNORMAL LOW (ref 8.9–10.3)
Chloride: 109 mmol/L (ref 101–111)
Chloride: 110 mmol/L (ref 101–111)
Creatinine, Ser: 1.44 mg/dL — ABNORMAL HIGH (ref 0.44–1.00)
GFR calc Af Amer: 47 mL/min — ABNORMAL LOW (ref >60)
GFR calc non Af Amer: 35 mL/min — ABNORMAL LOW (ref >60)
GFR, EST AFRICAN AMERICAN: 41 mL/min — AB (ref >60)
GFR, EST NON AFRICAN AMERICAN: 40 mL/min — AB (ref >60)
GLUCOSE: 94 mg/dL (ref 65–99)
Glucose, Bld: 81 mg/dL (ref 65–99)
POTASSIUM: 3.8 mmol/L (ref 3.5–5.1)
Potassium: 3.8 mmol/L (ref 3.5–5.1)
SODIUM: 139 mmol/L (ref 135–145)
Sodium: 141 mmol/L (ref 135–145)
TOTAL PROTEIN: 5.3 g/dL — AB (ref 6.5–8.1)
TOTAL PROTEIN: 5.6 g/dL — AB (ref 6.5–8.1)

## 2017-01-31 LAB — CBC WITH DIFFERENTIAL/PLATELET
BASOS PCT: 0 %
Basophils Absolute: 0 10*3/uL (ref 0.0–0.1)
EOS ABS: 0.1 10*3/uL (ref 0.0–0.7)
Eosinophils Relative: 1 %
HCT: 29.7 % — ABNORMAL LOW (ref 36.0–46.0)
Hemoglobin: 9.7 g/dL — ABNORMAL LOW (ref 12.0–15.0)
Lymphocytes Relative: 25 %
Lymphs Abs: 3.1 10*3/uL (ref 0.7–4.0)
MCH: 28.4 pg (ref 26.0–34.0)
MCHC: 32.7 g/dL (ref 30.0–36.0)
MCV: 86.8 fL (ref 78.0–100.0)
MONO ABS: 1.1 10*3/uL — AB (ref 0.1–1.0)
MONOS PCT: 8 %
NEUTROS PCT: 66 %
Neutro Abs: 8.4 10*3/uL — ABNORMAL HIGH (ref 1.7–7.7)
Platelets: 206 10*3/uL (ref 150–400)
RBC: 3.42 MIL/uL — ABNORMAL LOW (ref 3.87–5.11)
RDW: 13.2 % (ref 11.5–15.5)
WBC: 12.7 10*3/uL — ABNORMAL HIGH (ref 4.0–10.5)

## 2017-01-31 LAB — URINALYSIS, ROUTINE W REFLEX MICROSCOPIC
Bilirubin Urine: NEGATIVE
Glucose, UA: NEGATIVE mg/dL
Hgb urine dipstick: NEGATIVE
Ketones, ur: NEGATIVE mg/dL
NITRITE: NEGATIVE
PH: 5 (ref 5.0–8.0)
Protein, ur: NEGATIVE mg/dL
SPECIFIC GRAVITY, URINE: 1.006 (ref 1.005–1.030)

## 2017-01-31 LAB — I-STAT CHEM 8, ED
BUN: 33 mg/dL — ABNORMAL HIGH (ref 6–20)
Calcium, Ion: 1.18 mmol/L (ref 1.15–1.40)
Chloride: 107 mmol/L (ref 101–111)
Creatinine, Ser: 1.6 mg/dL — ABNORMAL HIGH (ref 0.44–1.00)
GLUCOSE: 90 mg/dL (ref 65–99)
HEMATOCRIT: 27 % — AB (ref 36.0–46.0)
HEMOGLOBIN: 9.2 g/dL — AB (ref 12.0–15.0)
POTASSIUM: 3.8 mmol/L (ref 3.5–5.1)
Sodium: 141 mmol/L (ref 135–145)
TCO2: 23 mmol/L (ref 22–32)

## 2017-01-31 LAB — BRAIN NATRIURETIC PEPTIDE: B Natriuretic Peptide: 54.6 pg/mL (ref 0.0–100.0)

## 2017-01-31 LAB — I-STAT TROPONIN, ED: Troponin i, poc: 0.01 ng/mL (ref 0.00–0.08)

## 2017-01-31 LAB — RETICULOCYTES
RBC.: 3.7 MIL/uL — ABNORMAL LOW (ref 3.87–5.11)
RETIC CT PCT: 2 % (ref 0.4–3.1)
Retic Count, Absolute: 74 10*3/uL (ref 19.0–186.0)

## 2017-01-31 LAB — LACTATE DEHYDROGENASE: LDH: 120 U/L (ref 98–192)

## 2017-01-31 LAB — RAPID URINE DRUG SCREEN, HOSP PERFORMED
Amphetamines: NOT DETECTED
BENZODIAZEPINES: NOT DETECTED
Barbiturates: NOT DETECTED
COCAINE: POSITIVE — AB
Opiates: NOT DETECTED
TETRAHYDROCANNABINOL: POSITIVE — AB

## 2017-01-31 LAB — MRSA PCR SCREENING: MRSA BY PCR: NEGATIVE

## 2017-01-31 LAB — I-STAT CG4 LACTIC ACID, ED: Lactic Acid, Venous: 1.6 mmol/L (ref 0.5–1.9)

## 2017-01-31 LAB — D-DIMER, QUANTITATIVE (NOT AT ARMC): D DIMER QUANT: 0.28 ug{FEU}/mL (ref 0.00–0.50)

## 2017-01-31 LAB — ETHANOL: Alcohol, Ethyl (B): <5 mg/dL (ref <5)

## 2017-01-31 MED ORDER — VITAMIN B-1 100 MG PO TABS
100.0000 mg | ORAL_TABLET | Freq: Every day | ORAL | Status: DC
  Administered 2017-01-31 – 2017-02-03 (×4): 100 mg via ORAL
  Filled 2017-01-31 (×4): qty 1

## 2017-01-31 MED ORDER — ACETAMINOPHEN 650 MG RE SUPP: 650.0000 mg | Freq: Four times a day (QID) | RECTAL | Status: DC | PRN

## 2017-01-31 MED ORDER — LIDOCAINE 5 % EX PTCH
1.0000 | MEDICATED_PATCH | CUTANEOUS | Status: DC
  Administered 2017-01-31 – 2017-02-02 (×3): 1 via TRANSDERMAL
  Filled 2017-01-31 (×4): qty 1

## 2017-01-31 MED ORDER — SODIUM CHLORIDE 0.9 % IV BOLUS (SEPSIS)
1000.0000 mL | Freq: Once | INTRAVENOUS | Status: AC
  Administered 2017-01-31: 1000 mL via INTRAVENOUS

## 2017-01-31 MED ORDER — VANCOMYCIN HCL IN DEXTROSE 1-5 GM/200ML-% IV SOLN: 1000.0000 mg | Freq: Once | INTRAVENOUS | Status: DC

## 2017-01-31 MED ORDER — ALBUTEROL SULFATE (2.5 MG/3ML) 0.083% IN NEBU: 2.5000 mg | INHALATION_SOLUTION | RESPIRATORY_TRACT | Status: DC | PRN

## 2017-01-31 MED ORDER — SODIUM CHLORIDE 0.9% FLUSH
3.0000 mL | Freq: Two times a day (BID) | INTRAVENOUS | Status: DC
  Administered 2017-01-31 – 2017-02-03 (×5): 3 mL via INTRAVENOUS

## 2017-01-31 MED ORDER — CLONAZEPAM 1 MG PO TABS: 1.0000 mg | ORAL_TABLET | Freq: Two times a day (BID) | ORAL | Status: DC

## 2017-01-31 MED ORDER — VANCOMYCIN HCL IN DEXTROSE 1-5 GM/200ML-% IV SOLN
1000.0000 mg | INTRAVENOUS | Status: DC
  Administered 2017-02-01: 1000 mg via INTRAVENOUS
  Filled 2017-01-31: qty 200

## 2017-01-31 MED ORDER — NICOTINE 21 MG/24HR TD PT24
21.0000 mg | MEDICATED_PATCH | Freq: Every day | TRANSDERMAL | Status: DC
  Administered 2017-01-31 – 2017-02-03 (×4): 21 mg via TRANSDERMAL
  Filled 2017-01-31 (×4): qty 1

## 2017-01-31 MED ORDER — HEPARIN SODIUM (PORCINE) 5000 UNIT/ML IJ SOLN: 5000.0000 [IU] | Freq: Three times a day (TID) | INTRAMUSCULAR | Status: DC

## 2017-01-31 MED ORDER — PANTOPRAZOLE SODIUM 40 MG PO TBEC
80.0000 mg | DELAYED_RELEASE_TABLET | Freq: Every day | ORAL | Status: DC
  Administered 2017-02-01 – 2017-02-03 (×3): 80 mg via ORAL
  Filled 2017-01-31 (×4): qty 2

## 2017-01-31 MED ORDER — BUDESONIDE 0.5 MG/2ML IN SUSP
1.0000 mg | Freq: Two times a day (BID) | RESPIRATORY_TRACT | Status: DC
  Administered 2017-02-01: 1 mg via RESPIRATORY_TRACT
  Administered 2017-02-01 – 2017-02-02 (×2): 0.5 mg via RESPIRATORY_TRACT
  Administered 2017-02-02: 1 mg via RESPIRATORY_TRACT
  Administered 2017-02-03: 0.5 mg via RESPIRATORY_TRACT
  Filled 2017-01-31 (×5): qty 4

## 2017-01-31 MED ORDER — GEMFIBROZIL 600 MG PO TABS
600.0000 mg | ORAL_TABLET | Freq: Two times a day (BID) | ORAL | Status: DC
Start: 2017-02-01 — End: 2017-02-03
  Administered 2017-02-01 – 2017-02-03 (×5): 600 mg via ORAL
  Filled 2017-01-31 (×8): qty 1

## 2017-01-31 MED ORDER — SODIUM CHLORIDE 0.9 % IV SOLN
1500.0000 mg | Freq: Once | INTRAVENOUS | Status: AC
  Administered 2017-01-31: 1500 mg via INTRAVENOUS
  Filled 2017-01-31: qty 1500

## 2017-01-31 MED ORDER — LEVOFLOXACIN IN D5W 750 MG/150ML IV SOLN: 750.0000 mg | INTRAVENOUS | Status: DC

## 2017-01-31 MED ORDER — SODIUM CHLORIDE 0.9 % IV SOLN
INTRAVENOUS | Status: AC
  Administered 2017-01-31: 21:00:00 via INTRAVENOUS

## 2017-01-31 MED ORDER — CLONAZEPAM 0.5 MG PO TABS
0.5000 mg | ORAL_TABLET | Freq: Two times a day (BID) | ORAL | Status: DC
  Administered 2017-01-31 – 2017-02-03 (×6): 0.5 mg via ORAL
  Filled 2017-01-31 (×6): qty 1

## 2017-01-31 MED ORDER — TOPIRAMATE 100 MG PO TABS
200.0000 mg | ORAL_TABLET | Freq: Every day | ORAL | Status: DC
Start: 2017-01-31 — End: 2017-02-03
  Administered 2017-01-31 – 2017-02-02 (×3): 200 mg via ORAL
  Filled 2017-01-31 (×3): qty 2

## 2017-01-31 MED ORDER — LORAZEPAM 1 MG PO TABS
1.0000 mg | ORAL_TABLET | Freq: Four times a day (QID) | ORAL | Status: DC | PRN
  Administered 2017-02-01: 1 mg via ORAL
  Filled 2017-01-31 (×2): qty 1

## 2017-01-31 MED ORDER — FOLIC ACID 1 MG PO TABS
1.0000 mg | ORAL_TABLET | Freq: Every day | ORAL | Status: DC
  Administered 2017-01-31 – 2017-02-03 (×4): 1 mg via ORAL
  Filled 2017-01-31 (×4): qty 1

## 2017-01-31 MED ORDER — LORAZEPAM 2 MG/ML IJ SOLN: 1.0000 mg | Freq: Four times a day (QID) | INTRAMUSCULAR | Status: DC | PRN

## 2017-01-31 MED ORDER — LEVOFLOXACIN IN D5W 750 MG/150ML IV SOLN
750.0000 mg | Freq: Once | INTRAVENOUS | Status: AC
  Administered 2017-01-31: 750 mg via INTRAVENOUS
  Filled 2017-01-31: qty 150

## 2017-01-31 MED ORDER — DEXTROSE 5 % IV SOLN
1.0000 g | Freq: Three times a day (TID) | INTRAVENOUS | Status: DC
  Administered 2017-01-31 – 2017-02-02 (×6): 1 g via INTRAVENOUS
  Filled 2017-01-31 (×9): qty 1

## 2017-01-31 MED ORDER — SODIUM CHLORIDE 0.9 % IV BOLUS (SEPSIS)
500.0000 mL | Freq: Once | INTRAVENOUS | Status: AC
  Administered 2017-01-31: 500 mL via INTRAVENOUS

## 2017-01-31 MED ORDER — ACETAMINOPHEN 325 MG PO TABS
650.0000 mg | ORAL_TABLET | Freq: Four times a day (QID) | ORAL | Status: DC | PRN
  Administered 2017-02-01 – 2017-02-03 (×5): 650 mg via ORAL
  Filled 2017-01-31 (×5): qty 2
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
## 2017-01-31 NOTE — H&P (Signed)
[facility] Admission History and Physical Service Pager: [phone]  Patient name: Darlene Wilcox Medical record number: [id number] Date of birth: [birth year] Age: 56 y.o. Gender: female  Primary Care Provider: Patient, No Pcp Per Consultants: none Code Status: Partial code on admission - CPR but no intubation  Chief Complaint: Dizziness with associated fatigue, hypotension  Assessment and Plan: Darlene Wilcox is a 56 y.o. female presenting with hypotension, dizziness, and generalized weakness. PMH is significant for CHF, prolonged QT, Asthma, seizures, Bipolar disorder, GERD, and history of substance and alcohol abuse.   Hypotension: Differential includes dehydration, substance overdose (UDS + for cocaine and THC), sepsis, medication overuse (on gabapentin as well as 5 psych meds including klonipin QID). Given hypotension, bradycardia, and hypothermia, will treat as sepsis. With penicillin allergy, patient requires levaquin, aztreonam, and vanc per pharmacy with sepsis of unknown source order set. Clinically dry on exam, initial Cr 1.61, lactate 1.6. D dimer negative, making PE less likely. Trop negative, making ACS less likely. WBC 12.7. UA with leuks and trace bacteria, making UTI a possible cause. -vanc, levaquin, and aztreonam per pharmacy -cautious IVF given CHF with 50cc NS x 12 hours -Follow BCx, UCx, and Lactic Acid  Slurred speech: patient with slurred speech on exam while hypotensive. UDS + cocaine and THC. No additional neuro deficits, making stroke less likely. CT head without acute finding.  -continue to monitor -bedside swallow by RN   AKI: likely due to dehydration in the setting of hypotension as above. Cr 1.61, baseline is 0.59 from 08/2016. If not resolving, will consider FeUrea and renal US.  -CMP in am -gentle IVF  Substance Abuse: Patient endorses marijuana and cocaine use recently. Possible drug interaction with her current  medications. Patient states multiple times to multiple providers that she "needs her klonipin." -CIWA scoring  CHF: new dx 07/2016, echo from that admission with EF 40-45% and diffuse hypokinesis. Cath at that time with nonischemic cardiomyopathy (10% mid RCA). Home med is lasix 171m daily, toprol XL 228mdaily. BNP 54. Clinically dry on exam. -hold toprol in the setting of sepsis -hold lasix in the setting of sepsis and dehydration  Back pain: patient endorses lumbar spinal back pain on exam, as well as paraspinal muscle tenderness. On chart review, hx of two back fusions (L2-5 2016 and L4-5 2008).  -XR lumbar spine -lidocaine patch -kpad -tylenol PRN  HTN: cozaar, toprol are home meds.  -no antihypertensives while septic  Bipolar disorder: home meds are imipramine, prozac, lamictal, klonipin QID, topamax, geodon. Given hypotension with slurred speech and concern for medication overuse, will hold imipramine, prozac, geodon. Interestingly, no benzos noted on UDS. -hold imipramine, prozac, geodon -continue topomax -consider psych consult for medication review -give reduced dose of klonipin to prevent withdrawal (0.71m86mID rather than QID) -given use of klonipin, will order CIWA protocol for possible withdrawal -hold home gabapentin  Incontinence: new home med is myrbetric.  -hold myrbetric while dehydrated   HLD: on gemfibrozil at home.  -continue gemfibrozil  Anemia: Hgb 9.7, baseline appears to be 12 on 08/2016. No signs of bleeding on exam. MCV normal. Patient endorses dark stool, no frank blood.  -repeat CBC in am -add on retic -FOBT -LDH, haptoglobin -reassess menstrual history in am  FEN/GI: NPO Prophylaxis: SCDs given significant anemia  Disposition: stable, admit and monitor  History of Present Illness:  Darlene Wilcox a 55 59o. female presenting with dizziness that has been ongoing for 4 weeks. acutely worsening  this morning while trying to do light chores  (folding laundry). Patient states it gets better when she sits and rests and gets worse with movement. She has associated weakness in her arms that also happened yesterday. She also has some slurred speech and slow speech. She admits to smoking marijuana two days ago and states it was laced with cocaine. She denies missing doses of her medications and accidentally taking too much. She doesn't recall her seizure meds, last seizure was many years ago. She takes klonipin up to 4 times per day due to panic attacks. She states she didn't run out but has been trying to cut back on these so as not to run out. She states she smokes 5 cigarettes per day and has not had any alcohol in over 18 years. She was newly diagnosed with CHF from admission in March 2018, has had some SOB today with chest tightness on deep inspiration. BP at home normally 113/87. Met a new PCP yesterday and was prescribed a Zpack as family has rhinorrhea and cough.   In the ED, VS suggestive of sepsis. Given 800cc IVF in EMS truck, then 1.5L NS in ED. Labs as above. Aztreonam started by ED MD.   Review Of Systems: Per HPI with the following additions:   Review of Systems  Constitutional: Positive for chills. Negative for fever and weight loss.  HENT: Positive for congestion.   Respiratory: Positive for cough and shortness of breath.   Cardiovascular: Negative for chest pain and palpitations.  Gastrointestinal: Positive for diarrhea. Negative for abdominal pain, nausea and vomiting.  Genitourinary: Negative for dysuria and urgency.  Neurological: Positive for weakness.  Psychiatric/Behavioral: Positive for substance abuse.    Patient Active Problem List   Diagnosis Date Noted  . Hypotension 01/31/2017  . AKI (acute kidney injury) ([place])   . Sepsis ([place])   . Slurred speech   . Acute hypoxemic respiratory failure ([place]) 08/12/2016  . Superficial thrombophlebitis 08/12/2016  . Acute congestive heart failure ([place]) 08/07/2016  . Acute  systolic CHF (congestive heart failure) ([place])   . Chest pain 08/06/2016  . Prolonged QT interval 08/06/2016  . Volume overload 08/05/2016  . Contusion of hip 05/08/2016  . Asthma, chronic 08/07/2014  . Seizures ([place]) 02/23/2014  . Bipolar disorder ([place]) 11/13/2006  . DISORDER, TOBACCO USE 11/13/2006  . GERD 11/13/2006  . BACK PAIN, CHRONIC 11/13/2006  . HX, PERSONAL, ALCOHOLISM 11/13/2006    Past Medical History: Past Medical History:  Diagnosis Date  . Anxiety   . Asthma    daily inhaler  . Bipolar disorder ([place])   . Bronchitis due to tobacco use ([place]) 07/2016  . 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 Chronic back pain greater than 3 months duration   . Depression   . Full dentures   . GERD (gastroesophageal reflux disease)   . Headache(784.0)    1-2 x/month  . History of ankle sprain    right; 2 weeks ago;  c/o severe pain  . Seizures ([place])    states "silent seizures"; is on anticonvulsant; none in 2-3 mos.    Past Surgical History: Past Surgical History:  Procedure Laterality Date  . ABDOMINAL HYSTERECTOMY     partial  . APPENDECTOMY    . BUNIONECTOMY Left   . CARPAL TUNNEL RELEASE Right 01/13/2013   Procedure: CARPAL TUNNEL RELEASE;  Surgeon: Cammie Sickle., MD;  Location: [facility];  Service: Orthopedics;  Laterality: Right;  . CERVICAL FUSION  x 2  . DIAGNOSTIC LAPAROSCOPY  11/08/2006  . INGUINAL HERNIA REPAIR Right   . LAPAROSCOPIC APPENDECTOMY  11/08/2006  . LEFT HEART CATH AND CORONARY ANGIOGRAPHY N/A 08/08/2016   Procedure: Left Heart Cath and Coronary Angiography;  Surgeon: Wellington Hampshire, MD;  Location: [facility];  Service: Cardiovascular;  Laterality: N/A;  . LUMBAR FUSION  06/28/2014   L2-5  . LUMBAR LAMINECTOMY  02/05/2007   L4-5 fusion  . TUBAL LIGATION      Social History: Social History  Substance Use Topics  . Smoking status: Current Some Day Smoker    Packs/day: 1.00    Years: 20.00    Types:  Cigarettes  . Smokeless tobacco: Never Used     Comment:  patient states smokes only 5 cigarettes per day  . Alcohol use No   Additional social history: lives with boyfriend. Disabled due to bipolar.  Please also refer to relevant sections of EMR.  Family History: Family History  Problem Relation Age of Onset  . Lung cancer Father   . Diabetes Maternal Grandmother   . Heart attack Maternal Grandmother   . Deep vein thrombosis Neg Hx     Allergies and Medications: Allergies  Allergen Reactions  . Apple Anaphylaxis and Shortness Of Breath  . Penicillins Anaphylaxis, Shortness Of Breath, Itching and Swelling    Has patient had a PCN reaction causing immediate rash, facial/tongue/throat swelling, SOB or lightheadedness with hypotension: Unknown, childhood reaction Has patient had a PCN reaction causing severe rash involving mucus membranes or skin necrosis: No Has patient had a PCN reaction that required hospitalization No Has patient had a PCN reaction occurring within the last 10 years: No If all of the above answers are "NO", then may proceed with Cephalosporin use.   . Bee Venom Swelling    Takes benadryl to help with reaction   No current facility-administered medications on file prior to encounter.    Current Outpatient Prescriptions on File Prior to Encounter  Medication Sig Dispense Refill  . ascorbic acid (VITAMIN C) 500 MG tablet TAKE (3) TABLETS BY MOUTH ONCE DAILY. (Patient taking differently: TAKE 1500MG BY MOUTH ONCE DAILY.) 90 tablet 0  . Calcium Citrate-Vitamin D (CALCIUM CITRATE + D3 PO) Take 1 tablet by mouth daily.    . clonazePAM (KLONOPIN) 1 MG tablet Take 1 tablet (1 mg total) by mouth 3 (three) times daily. (Patient taking differently: Take 1 mg by mouth 4 (four) times daily. ) 30 tablet 3  . diclofenac sodium (VOLTAREN) 1 % GEL APPLY 2 GRAMS TO AFFECTED AREAS(S) 4 TIMES DAILY. 100 g 0  . esomeprazole (NEXIUM) 40 MG capsule TAKE 1 CAPSULE BY MOUTH ONCE A DAY.  (Patient taking differently: TAKE 40MG BY MOUTH ONCE A DAY.) 28 capsule 0  . FLOVENT HFA 110 MCG/ACT inhaler INHALE 2 PUFFS TWICE DAILY. 12 g 0  . FLUoxetine (PROZAC) 40 MG capsule Take 80 mg by mouth daily.     . furosemide (LASIX) 40 MG tablet Take 1 tablet (40 mg total) by mouth daily. 30 tablet 4  . gabapentin (NEURONTIN) 300 MG capsule TAKE 1 CAPSULE AT BEDTIME X3DAYS, INCREASE TO 1 CAP TWICE A DAY X3DAYS, THEN 1 CAP THREE TIMES A DAY (Patient taking differently: TAKE 300MG BY MOUTH THREE TIMES A DAY) 90 capsule 0  . gemfibrozil (LOPID) 600 MG tablet TAKE 1 TABLET (600 MG TOTAL) BY MOUTH 2 (TWO) TIMES DAILY BEFORE A MEAL. 60 tablet 5  . imipramine (TOFRANIL) 50 MG  tablet TAKE 2 TABLETS BY MOUTH AT BEDTIME. (Patient taking differently: TAKE 100MG BY MOUTH AT BEDTIME.) 60 tablet 0  . lamoTRIgine (LAMICTAL) 25 MG tablet Take 50 mg by mouth at bedtime.   3  . losartan (COZAAR) 25 MG tablet Take 1 tablet (25 mg total) by mouth daily. 30 tablet 2  . metoprolol succinate (TOPROL-XL) 25 MG 24 hr tablet Take 1 tablet (25 mg total) by mouth daily. (Patient taking differently: Take 25 mg by mouth at bedtime. ) 30 tablet 2  . Multiple Vitamin (MULTIVITAMIN WITH MINERALS) TABS tablet Take 1 tablet by mouth daily.    Marland Kitchen MYRBETRIQ 50 MG TB24 tablet TAKE 1 TABLET BY MOUTH ONCE DAILY FOR BLADDER. (Patient taking differently: TAKE 50MG BY MOUTH ONCE DAILY FOR BLADDER.) 28 tablet 0  . nicotine (NICODERM CQ - DOSED IN MG/24 HOURS) 21 mg/24hr patch Place 1 patch (21 mg total) onto the skin daily. 28 patch 2  . omega-3 acid ethyl esters (LOVAZA) 1 g capsule TAKE 1 CAPSULE BY MOUTH TWICE DAILY. (Patient taking differently: TAKE 1G BY MOUTH TWICE DAILY.) 56 capsule 0  . polyethylene glycol powder (GLYCOLAX/MIRALAX) powder MIX (1 CAPFUL) 17 GRAMS IN 8 OZ OF JUICE OR WATER AND DRINK TWICE A DAY AS NEEDED FOR CONSTIPATION. 527 g 0  . potassium chloride SA (K-DUR,KLOR-CON) 20 MEQ tablet TAKE (1) TABLET BY MOUTH ONCE DAILY.  (Patient taking differently: TAKE 20MEQ BY MOUTH ONCE DAILY.) 28 tablet 0  . PROAIR HFA 108 (90 Base) MCG/ACT inhaler INHALE 2 PUFFS INTO THE LUNGS EVERY 4 HOURS AS NEEDED FOR WHEEZING ORSHORTNESS OF BREATH. 8.5 g 0  . topiramate (TOPAMAX) 200 MG tablet Take 200 mg by mouth at bedtime.   2  . ziprasidone (GEODON) 80 MG capsule Take 160 mg by mouth at bedtime.     . Blood Pressure Monitoring (BLOOD PRESS MONITOR/M-L CUFF) MISC 1 each by Does not apply route daily. 1 each 0  . naproxen (NAPROSYN) 500 MG tablet TAKE (1) TABLET BY MOUTH TWICE A DAY WITH MEALS. (Patient not taking: Reported on 01/31/2017) 56 tablet 0  . [DISCONTINUED] metolazone (ZAROXOLYN) 2.5 MG tablet Take 1 tablet (2.5 mg total) by mouth daily. (Patient not taking: Reported on 04/09/2014) 3 tablet 1    Objective: BP (!) 89/54 (BP Location: Right Arm)   Pulse 64   Temp 97.6 F (36.4 C) (Oral)   Resp (!) 22   Ht _0  (1.549 m)   Wt 163 lb 1.6 oz (74 kg)   SpO2 97%   BMI 30.82 kg/m  Exam: General: Alert and Oriented x 4, NAD Eyes: PERRLA, EOMI ENTM: non-erythematous pharyngeal mucosa, uvula midline Neck: No cervical LAD, trachea midline Cardiovascular: Bradycardia, RR, Normal S1, S2, no murmur Respiratory: Wheezes bilaterally in lower lung fields, no crackles Gastrointestinal: SNTND, +BS MSK: moves all extremities, no gross deformities Derm: warm, dry, intact, poor skin tugor, no rashes Neuro: CN II-VII intact, 5/5 strength bilaterally in upper and lower extremities, finger to nose intact, gait not observed Psych: normal mood and affect, cooperative  Labs and Imaging: CBC BMET   Recent Labs Lab 01/31/17 1338 01/31/17 1411  WBC 12.7*  --   HGB 9.7* 9.2*  HCT 29.7* 27.0*  PLT 206  --     Recent Labs Lab 01/31/17 1426  NA 141  K 3.8  CL 110  CO2 21*  BUN 33*  CREATININE 1.44*  GLUCOSE 81  CALCIUM 8.5*     Images/Labs/Tests Ct Head Wo Contrast  Result Date: 01/31/2017 CLINICAL DATA:  Altered level  consciousness, unexplained. EXAM: CT HEAD WITHOUT CONTRAST TECHNIQUE: Contiguous axial images were obtained from the base of the skull through the vertex without intravenous contrast. COMPARISON:  CT head without contrast 07/19/2016 FINDINGS: Brain: Mild subcortical white matter hypoattenuation is stable. No acute cortical infarct, hemorrhage, or mass lesion is present. The ventricles are of normal size. No significant extraaxial fluid collection is present. Vascular: Minimal atherosclerotic calcifications are present in the cavernous internal carotid artery is without a hyperdense vessel. Skull: Calvarium is intact. No focal lytic or blastic lesions are present. Sinuses/Orbits: The paranasal sinuses and mastoid air cells are clear. IMPRESSION: 1. No acute intracranial abnormality or significant interval change. 2. Stable mild white matter disease. The finding is nonspecific but can be seen in the setting of chronic microvascular ischemia, a demyelinating process such as multiple sclerosis, vasculitis, complicated migraine headaches, or as the sequelae of a prior infectious or inflammatory process. Electronically Signed   By: San Morelle M.D.   On: 01/31/2017 16:10   Dg Chest Port 1 View  Result Date: 01/31/2017 CLINICAL DATA:  Hypotension EXAM: PORTABLE CHEST 1 VIEW COMPARISON:  08/15/2016 FINDINGS: The heart size and mediastinal contours are within normal limits. Both lungs are clear. The visualized skeletal structures are unremarkable. IMPRESSION: No active disease. Electronically Signed   By: Franchot Gallo M.D.   On: 01/31/2017 13:28    UDS: Positive for Cocaine and Tetrahydrocannabinol D-Dimer: 0.28 Troponin: 0.01 Lactic Acid: 1.60 U/A: Trace Leukocytes, no nitrites, hyaline casts and Ca Oxalate Crystals present   Sela Hilding, MD 01/31/2017, 8:35 PM PGY-1, [facility] pager: [phone], text pages welcome

## 2017-01-31 NOTE — ED Notes (Signed)
Report attempted 

## 2017-01-31 NOTE — Progress Notes (Signed)
Patient says her and her boyfriend are the caregivers for another man - says he's her boyfriend's uncle brother. He is in  a wheelchair from the hospital with a tank of 02 - this is how patient and man came from ED. Patient informed that we cannot care for this man while she is in the hospital. Pt says she is well aware that he cannot stay the night. Patients boyfriend  is here says he will take him home with him tonight.

## 2017-01-31 NOTE — ED Provider Notes (Signed)
[facility] DEPT Provider Note   CSN: [id number] Arrival date & time: 01/31/17  1249  History   Chief Complaint Chief Complaint  Patient presents with  . Hypotension    HPI Darlene Wilcox is a 56 y.o. female who presents to [facility] via EMS with hypotension. Patient reports she has been feeling fatigued and run down for the past 3-4 weeks and attributed this to her CHF. She called EMS this morning because she was trying to do chores around the house and could barely function. She checked her BP with home cuff and it was 70's/40's. She called EMS and BP was low on their check as well (80's/50's) so she agreed to come to hospital. She denies alcohol or drug use. She reports compliance with her medications including 40mg  lasix daily. She denies chest pain. Endorses occasional SOB. Denies dysuria, no fevers or chills. No sick contacts.   HPI  Past Medical History:  Diagnosis Date  . Anxiety   . Asthma    daily inhaler  . Bipolar disorder ([place])   . Bronchitis due to tobacco use ([place]) 07/2016  . 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 Chronic back pain greater than 3 months duration   . Depression   . Full dentures   . GERD (gastroesophageal reflux disease)   . Headache(784.0)    1-2 x/month  . History of ankle sprain    right; 2 weeks ago;  c/o severe pain  . Seizures ([place])    states "silent seizures"; is on anticonvulsant; none in 2-3 mos.    Patient Active Problem List   Diagnosis Date Noted  . Acute hypoxemic respiratory failure ([place]) 08/12/2016  . Superficial thrombophlebitis 08/12/2016  . Acute congestive heart failure ([place]) 08/07/2016  . Acute systolic CHF (congestive heart failure) ([place])   . Chest pain 08/06/2016  . Prolonged QT interval 08/06/2016  . Volume overload 08/05/2016  . Contusion of hip 05/08/2016  . Asthma, chronic 08/07/2014  . Seizures ([place]) 02/23/2014  . Bipolar disorder ([place]) 11/13/2006  . DISORDER, TOBACCO USE 11/13/2006  . GERD 11/13/2006   . BACK PAIN, CHRONIC 11/13/2006  . HX, PERSONAL, ALCOHOLISM 11/13/2006    Past Surgical History:  Procedure Laterality Date  . ABDOMINAL HYSTERECTOMY     partial  . APPENDECTOMY    . BUNIONECTOMY Left   . CARPAL TUNNEL RELEASE Right 01/13/2013   Procedure: CARPAL TUNNEL RELEASE;  Surgeon: Cammie Sickle., MD;  Location: [facility];  Service: Orthopedics;  Laterality: Right;  . CERVICAL FUSION     x 2  . DIAGNOSTIC LAPAROSCOPY  11/08/2006  . INGUINAL HERNIA REPAIR Right   . LAPAROSCOPIC APPENDECTOMY  11/08/2006  . LEFT HEART CATH AND CORONARY ANGIOGRAPHY N/A 08/08/2016   Procedure: Left Heart Cath and Coronary Angiography;  Surgeon: Wellington Hampshire, MD;  Location: [facility];  Service: Cardiovascular;  Laterality: N/A;  . LUMBAR FUSION  06/28/2014   L2-5  . LUMBAR LAMINECTOMY  02/05/2007   L4-5 fusion  . TUBAL LIGATION      OB History    No data available     Home Medications    Prior to Admission medications   Medication Sig Start Date End Date Taking? Authorizing Provider  ascorbic acid (VITAMIN C) 500 MG tablet TAKE (3) TABLETS BY MOUTH ONCE DAILY. 09/19/16   Claretta Fraise, MD  Blood Pressure Monitoring (BLOOD PRESS MONITOR/M-L CUFF) MISC 1 each by Does not apply route daily. 09/07/16  Almyra Deforest, [place]  Calcium Citrate-Vitamin D (CALCIUM CITRATE + D3 PO) Take 1 tablet by mouth daily.    [provider]  clonazePAM (KLONOPIN) 1 MG tablet Take 1 tablet (1 mg total) by mouth 3 (three) times daily. 04/28/14   Lysbeth Penner, FNP  clotrimazole (MYCELEX) 10 MG troche Take 1 tablet by mouth 5 (five) times daily. (Dissolve 1 tablet five times daily) 08/17/16   Hassell Done, Mary-Margaret, FNP  diclofenac sodium (VOLTAREN) 1 % GEL APPLY 2 GRAMS TO AFFECTED AREAS(S) 4 TIMES DAILY. 09/20/16   Claretta Fraise, MD  esomeprazole (NEXIUM) 40 MG capsule TAKE 1 CAPSULE BY MOUTH ONCE A DAY. 10/24/16   Claretta Fraise, MD  FLOVENT HFA 110 MCG/ACT inhaler INHALE 2 PUFFS  TWICE DAILY. 10/24/16   Claretta Fraise, MD  FLUoxetine (PROZAC) 40 MG capsule Take 80 mg by mouth daily.  05/01/16   [provider]  furosemide (LASIX) 40 MG tablet Take 1 tablet (40 mg total) by mouth daily. 08/15/16   Claretta Fraise, MD  gabapentin (NEURONTIN) 300 MG capsule TAKE 1 CAPSULE AT BEDTIME X3DAYS, INCREASE TO 1 CAP TWICE A DAY X3DAYS, THEN 1 CAP THREE TIMES A DAY Patient not taking: Reported on 10/04/2016 09/12/16   Claretta Fraise, MD  gemfibrozil (LOPID) 600 MG tablet TAKE 1 TABLET (600 MG TOTAL) BY MOUTH 2 (TWO) TIMES DAILY BEFORE A MEAL. 08/20/16   Claretta Fraise, MD  imipramine (TOFRANIL) 50 MG tablet TAKE 2 TABLETS BY MOUTH AT BEDTIME. 09/19/16   Claretta Fraise, MD  lamoTRIgine (LAMICTAL) 25 MG tablet Take 50 mg by mouth at bedtime.  10/05/14   Challa, Wallace Cullens, MD  losartan (COZAAR) 25 MG tablet Take 1 tablet (25 mg total) by mouth daily. 08/10/16   [name], MD  metoprolol succinate (TOPROL-XL) 25 MG 24 hr tablet Take 1 tablet (25 mg total) by mouth daily. 08/15/16   Claretta Fraise, MD  Multiple Vitamin (MULTIVITAMIN WITH MINERALS) TABS tablet Take 1 tablet by mouth daily.    [provider]  MYRBETRIQ 50 MG TB24 tablet TAKE 1 TABLET BY MOUTH ONCE DAILY FOR BLADDER. 10/24/16   Claretta Fraise, MD  naproxen (NAPROSYN) 500 MG tablet TAKE (1) TABLET BY MOUTH TWICE A DAY WITH MEALS. 10/24/16   Claretta Fraise, MD  nicotine (NICODERM CQ - DOSED IN MG/24 HOURS) 21 mg/24hr patch Place 1 patch (21 mg total) onto the skin daily. 08/10/16   [name], MD  omega-3 acid ethyl esters (LOVAZA) 1 g capsule TAKE 1 CAPSULE BY MOUTH TWICE DAILY. 10/24/16   Claretta Fraise, MD  polyethylene glycol powder (GLYCOLAX/MIRALAX) powder MIX (1 CAPFUL) 17 GRAMS IN 8 OZ OF JUICE OR WATER AND DRINK TWICE A DAY AS NEEDED FOR CONSTIPATION. 09/19/16   Claretta Fraise, MD  potassium chloride SA (K-DUR,KLOR-CON) 20 MEQ tablet TAKE (1) TABLET BY MOUTH ONCE DAILY. 10/24/16   Claretta Fraise, MD  PROAIR HFA  [phone] Base) MCG/ACT inhaler INHALE 2 PUFFS INTO THE LUNGS EVERY 4 HOURS AS NEEDED FOR WHEEZING ORSHORTNESS OF BREATH. 09/19/16   Claretta Fraise, MD  topiramate (TOPAMAX) 200 MG tablet Take 200 mg by mouth at bedtime.  03/31/14   Challa, Wallace Cullens, MD  ziprasidone (GEODON) 80 MG capsule Take 160 mg by mouth at bedtime.  03/29/13   Dewain Penning, MD    Family History Family History  Problem Relation Age of Onset  . Lung cancer Father   . Diabetes Maternal Grandmother   . Heart attack Maternal Grandmother   .  Deep vein thrombosis Neg Hx     Social History Social History  Substance Use Topics  . Smoking status: Current Some Day Smoker    Packs/day: 1.00    Years: 20.00    Types: Cigarettes  . Smokeless tobacco: Never Used     Comment:  patient states smokes only 5 cigarettes per day  . Alcohol use No     Allergies   Apple; Penicillins; and Bee venom   Review of Systems Review of Systems  Constitutional: Positive for activity change and fatigue. Negative for chills, diaphoresis and fever.  Respiratory: Negative for cough, chest tightness, shortness of breath and wheezing.   Cardiovascular: Negative for chest pain, palpitations and leg swelling.  Gastrointestinal: Negative for abdominal distention, abdominal pain, constipation, diarrhea, rectal pain and vomiting.  Genitourinary: Negative for dysuria and urgency.  Musculoskeletal: Positive for back pain.  Skin: Negative for rash.  Neurological: Positive for dizziness, speech difficulty, weakness and light-headedness. Negative for seizures, numbness and headaches.     Physical Exam Updated Vital Signs BP (!) 72/42 (BP Location: Right Arm)   Pulse 64   Temp (!) 95.6 F (35.3 C) (Rectal)   Resp 14   SpO2 99%   Physical Exam  Constitutional: She is oriented to person, place, and time. She appears well-developed and well-nourished.  HENT:  Head: Normocephalic and atraumatic.  Dry mucous membranes and lips  Eyes: Pupils  are equal, round, and reactive to light. Conjunctivae and EOM are normal.  Neck: Normal range of motion. Neck supple.  Cardiovascular: Normal rate, regular rhythm and intact distal pulses.  Exam reveals no gallop and no friction rub.   No murmur heard. Pulmonary/Chest: Effort normal and breath sounds normal. No respiratory distress. She has no wheezes.  Abdominal: Soft. Bowel sounds are normal. She exhibits no distension. There is no tenderness. There is no guarding.  Musculoskeletal: Normal range of motion. She exhibits no edema or deformity.  Lymphadenopathy:    She has no cervical adenopathy.  Neurological: She is alert and oriented to person, place, and time. No sensory deficit. She exhibits normal muscle tone.  Speech sluggish and slurred  Skin: Skin is warm and dry. No rash noted. She is not diaphoretic.  Psychiatric: She has a normal mood and affect. Her speech is slurred. She is slowed.  Nursing note and vitals reviewed.    ED Treatments / Results  Labs (all labs ordered are listed, but only abnormal results are displayed) Labs Reviewed  COMPREHENSIVE METABOLIC PANEL - Abnormal; Notable for the following:       Result Value   CO2 21 (*)    BUN 35 (*)    Creatinine, Ser 1.61 (*)    Calcium 8.5 (*)    Total Protein 5.3 (*)    Albumin 3.1 (*)    ALT 10 (*)    GFR calc non Af Amer 35 (*)    GFR calc Af Amer 41 (*)    All other components within normal limits  CBC WITH DIFFERENTIAL/PLATELET - Abnormal; Notable for the following:    WBC 12.7 (*)    RBC 3.42 (*)    Hemoglobin 9.7 (*)    HCT 29.7 (*)    Neutro Abs 8.4 (*)    Monocytes Absolute 1.1 (*)    All other components within normal limits  I-STAT CHEM 8, ED - Abnormal; Notable for the following:    BUN 33 (*)    Creatinine, Ser 1.60 (*)    Hemoglobin 9.2 (*)  HCT 27.0 (*)    All other components within normal limits  CULTURE, BLOOD (ROUTINE X 2)  CULTURE, BLOOD (ROUTINE X 2)  URINE CULTURE  D-DIMER,  QUANTITATIVE (NOT AT [facility])  URINALYSIS, ROUTINE W REFLEX MICROSCOPIC  RAPID URINE DRUG SCREEN, HOSP PERFORMED  ETHANOL  BRAIN NATRIURETIC PEPTIDE  COMPREHENSIVE METABOLIC PANEL  I-STAT CG4 LACTIC ACID, ED  I-STAT TROPONIN, ED    EKG  EKG Interpretation  Date/Time:  Thursday January 31 2017 13:05:49 EDT Ventricular Rate:  63 PR Interval:    QRS Duration: 103 QT Interval:  444 QTC Calculation: 455 R Axis:   -10 Text Interpretation:  Sinus rhythm Borderline prolonged PR interval Low voltage, precordial leads Borderline T abnormalities, diffuse leads rate slower since previous  Confirmed by Wandra Arthurs [phone]) on 01/31/2017 1:15:17 PM Also confirmed by Wandra Arthurs [phone]), editor Philomena Doheny [phone])  on 01/31/2017 2:40:35 PM       Radiology Dg Chest Port 1 View  Result Date: 01/31/2017 CLINICAL DATA:  Hypotension EXAM: PORTABLE CHEST 1 VIEW COMPARISON:  08/15/2016 FINDINGS: The heart size and mediastinal contours are within normal limits. Both lungs are clear. The visualized skeletal structures are unremarkable. IMPRESSION: No active disease. Electronically Signed   By: Franchot Gallo M.D.   On: 01/31/2017 13:28    Procedures Procedures (including critical care time)  Medications Ordered in ED Medications  sodium chloride 0.9 % bolus 500 mL (0 mLs Intravenous Stopped 01/31/17 1344)  sodium chloride 0.9 % bolus 1,000 mL (0 mLs Intravenous Stopped 01/31/17 1444)     Initial Impression / Assessment and Plan / ED Course  I have reviewed the triage vital signs and the nursing notes.  Pertinent labs & imaging results that were available during my care of the patient were reviewed by me and considered in my medical decision making (see chart for details).  Clinical Course as of Feb 01 1452  Thu Jan 31, 2017  1316 BP: (!) 81/45 [AR]  1436 Resp: 14 [AR]  1441 Potassium: 3.8 [AR]    Clinical Course User Index [AR] Steve Rattler, DO    Patient hypotensive with unknown  etiology- possibly secondary to dehydration and over diuresis vs infectious response. S/p 2.5 L bolus in ED with little improvement in BP. Hypothermic to 95.6 rectally. WBC elevated to 12.7. LA normal. No clear source of infection but meeting SIRS criteria. Will treat with Aztreonam (allergic to PCN) per sepsis order set. Additionally, patient with AKI creatinine 1.6 up from baseline of 0.59. Will consult for unassigned admission. Spoke with Family Medicine Dr. Lindell Noe who agreed to take patient to step down bed.   Final Clinical Impressions(s) / ED Diagnoses   Final diagnoses:  AKI (acute kidney injury) ([place])  Hypotension, unspecified hypotension type    New Prescriptions New Prescriptions   No medications on file   Lucila Maine, DO PGY-2, [facility] 01/31/2017 2:53 PM    Steve Rattler, DO 01/31/17 1453    Drenda Freeze, MD 02/01/17 [id number]    Drenda Freeze, MD 02/01/17 [phone]

## 2017-01-31 NOTE — Progress Notes (Signed)
Pt received from ED oriented to room CHG bath & MRSA swab done

## 2017-01-31 NOTE — ED Notes (Signed)
ED Provider at bedside. 

## 2017-01-31 NOTE — Progress Notes (Signed)
Pharmacy Antibiotic Note  Darlene Wilcox is a 56 y.o. female admitted on 01/31/2017 with sepsis.  Pharmacy has been consulted for vancomycin, Levaquin, and aztreonam dosing.  Afebrile. SCr down to 1.44, CrCl ~57ml/min.  Plan: Continue aztreonam 1g IV Q8h Give vancomycin 1.5g IV x 1, then start vancomycin 1g IV Q24h Start Levaquin 750mg  IV Q48h Monitor clinical picture, renal function, VT prn F/U C&S, abx deescalation / LOT   Height: 5\' 1"  (154.9 cm) Weight: 163 lb 1.6 oz (74 kg) IBW/kg (Calculated) : 47.8  Temp (24hrs), Avg:97.1 F (36.2 C), Min:95.6 F (35.3 C), Max:98 F (36.7 C)   Recent Labs Lab 01/31/17 1338 01/31/17 1410 01/31/17 1411 01/31/17 1426  WBC 12.7*  --   --   --   CREATININE 1.61*  --  1.60* 1.44*  LATICACIDVEN  --  1.60  --   --     Estimated Creatinine Clearance: 41.1 mL/min (A) (by C-G formula based on SCr of 1.44 mg/dL (H)).    Allergies  Allergen Reactions  . Apple Anaphylaxis and Shortness Of Breath  . Penicillins Anaphylaxis, Shortness Of Breath, Itching and Swelling    Has patient had a PCN reaction causing immediate rash, facial/tongue/throat swelling, SOB or lightheadedness with hypotension: Unknown, childhood reaction Has patient had a PCN reaction causing severe rash involving mucus membranes or skin necrosis: No Has patient had a PCN reaction that required hospitalization No Has patient had a PCN reaction occurring within the last 10 years: No If all of the above answers are "NO", then may proceed with Cephalosporin use.   . Bee Venom Swelling    Takes benadryl to help with reaction    Thank you for allowing pharmacy to be a part of this patient's care.  Reginia Naas 01/31/2017 8:22 PM

## 2017-01-31 NOTE — Progress Notes (Signed)
Pharmacy Antibiotic Note  Darlene Wilcox is a 56 y.o. female admitted on 01/31/2017 with sepsis.  Pharmacy has been consulted for Aztreonam dosing. WBC elevated at 12.7. SCr 1.6 (BL~ 0.8). nCrCl ~ 45 mL/min.   Plan: -Start Aztreonam 1 gm IV Q 8 hours -Monitor CBC, renal fx, cultures and clinical progress     Temp (24hrs), Avg:96.7 F (35.9 C), Min:95.6 F (35.3 C), Max:97.7 F (36.5 C)   Recent Labs Lab 01/31/17 1338 01/31/17 1410 01/31/17 1411  WBC 12.7*  --   --   CREATININE 1.61*  --  1.60*  LATICACIDVEN  --  1.60  --     CrCl cannot be calculated (Unknown ideal weight.).    Allergies  Allergen Reactions  . Apple Anaphylaxis and Shortness Of Breath  . Penicillins Anaphylaxis, Shortness Of Breath, Itching and Swelling    Has patient had a PCN reaction causing immediate rash, facial/tongue/throat swelling, SOB or lightheadedness with hypotension: Unknown, childhood reaction Has patient had a PCN reaction causing severe rash involving mucus membranes or skin necrosis: No Has patient had a PCN reaction that required hospitalization No Has patient had a PCN reaction occurring within the last 10 years: No If all of the above answers are "NO", then may proceed with Cephalosporin use.   . Bee Venom Swelling    Takes benadryl to help with reaction    Antimicrobials this admission: Aztrenoam 9/6 >>   Dose adjustments this admission: None  Microbiology results: 9/6 BCx:  9/6 UCx:    Thank you for allowing pharmacy to be a part of this patient's care.  Albertina Parr, PharmD., BCPS Clinical Pharmacist Pager [phone]

## 2017-01-31 NOTE — ED Triage Notes (Signed)
Pt arrived EMS with hypotension, sob with hx of Heart failure. Pt seemed slightly confused on arrival but has since improved. No obvious resp distress.

## 2017-01-31 NOTE — ED Notes (Signed)
Dr. Lindell Noe paged abut pt requesting food. Orders received for bedside swallow screen and regular diet if pt passes swallow study

## 2017-02-01 ENCOUNTER — Inpatient Hospital Stay (HOSPITAL_COMMUNITY): Payer: Medicare HMO

## 2017-02-01 DIAGNOSIS — A419 Sepsis, unspecified organism: Principal | ICD-10-CM

## 2017-02-01 DIAGNOSIS — I959 Hypotension, unspecified: Secondary | ICD-10-CM

## 2017-02-01 DIAGNOSIS — D649 Anemia, unspecified: Secondary | ICD-10-CM

## 2017-02-01 LAB — ECHOCARDIOGRAM COMPLETE
CHL CUP RV SYS PRESS: 32 mmHg
EERAT: 9.02
EWDT: 169 ms
FS: 27 % — AB (ref 28–44)
Height: 61 in
IVS/LV PW RATIO, ED: 0.97
LA diam end sys: 36 mm
LA vol A4C: 58.3 ml
LA vol: 60.2 mL
LADIAMINDEX: 2.08 cm/m2
LASIZE: 36 mm
LAVOLIN: 34.8 mL/m2
LV E/e' medial: 9.02
LV TDI E'LATERAL: 10.6
LV TDI E'MEDIAL: 8.27
LV e' LATERAL: 10.6 cm/s
LVEEAVG: 9.02
LVOT SV: 78 mL
LVOT VTI: 27.6 cm
LVOT area: 2.84 cm2
LVOT diameter: 19 mm
LVOT peak grad rest: 6 mmHg
LVOTPV: 118 cm/s
Lateral S' vel: 10.9 cm/s
MV Dec: 169
MV Peak grad: 4 mmHg
MV pk A vel: 82.9 m/s
MVPKEVEL: 95.6 m/s
PW: 9.45 mm — AB (ref 0.6–1.1)
RV TAPSE: 24.4 mm
Reg peak vel: 246 cm/s
TR max vel: 246 cm/s
Weight: 2609.6 oz

## 2017-02-01 LAB — COMPREHENSIVE METABOLIC PANEL
ALT: 11 U/L — AB (ref 14–54)
ANION GAP: 8 (ref 5–15)
AST: 14 U/L — ABNORMAL LOW (ref 15–41)
Albumin: 2.9 g/dL — ABNORMAL LOW (ref 3.5–5.0)
Alkaline Phosphatase: 70 U/L (ref 38–126)
BUN: 24 mg/dL — ABNORMAL HIGH (ref 6–20)
CALCIUM: 8.7 mg/dL — AB (ref 8.9–10.3)
CHLORIDE: 111 mmol/L (ref 101–111)
CO2: 22 mmol/L (ref 22–32)
CREATININE: 1.01 mg/dL — AB (ref 0.44–1.00)
Glucose, Bld: 94 mg/dL (ref 65–99)
Potassium: 3.9 mmol/L (ref 3.5–5.1)
Sodium: 141 mmol/L (ref 135–145)
Total Bilirubin: 0.5 mg/dL (ref 0.3–1.2)
Total Protein: 5.6 g/dL — ABNORMAL LOW (ref 6.5–8.1)

## 2017-02-01 LAB — CBC
HCT: 31.5 % — ABNORMAL LOW (ref 36.0–46.0)
HEMOGLOBIN: 10.2 g/dL — AB (ref 12.0–15.0)
MCH: 28.2 pg (ref 26.0–34.0)
MCHC: 32.4 g/dL (ref 30.0–36.0)
MCV: 87 fL (ref 78.0–100.0)
PLATELETS: 182 10*3/uL (ref 150–400)
RBC: 3.62 MIL/uL — AB (ref 3.87–5.11)
RDW: 13.2 % (ref 11.5–15.5)
WBC: 6.8 10*3/uL (ref 4.0–10.5)

## 2017-02-01 LAB — IRON AND TIBC
IRON: 118 ug/dL (ref 28–170)
SATURATION RATIOS: 36 % — AB (ref 10.4–31.8)
TIBC: 323 ug/dL (ref 250–450)
UIBC: 205 ug/dL

## 2017-02-01 LAB — TROPONIN I: Troponin I: <0.03 ng/mL (ref <0.03)

## 2017-02-01 LAB — URINE CULTURE

## 2017-02-01 LAB — VITAMIN B12: Vitamin B-12: 285 pg/mL (ref 180–914)

## 2017-02-01 LAB — FOLATE: FOLATE: 23.5 ng/mL (ref >5.9)

## 2017-02-01 LAB — FERRITIN: Ferritin: 159 ng/mL (ref 11–307)

## 2017-02-01 LAB — OCCULT BLOOD X 1 CARD TO LAB, STOOL: Fecal Occult Bld: NEGATIVE

## 2017-02-01 MED ORDER — FLUOXETINE HCL 20 MG PO CAPS
80.0000 mg | ORAL_CAPSULE | Freq: Every day | ORAL | Status: DC
  Administered 2017-02-02 – 2017-02-03 (×2): 80 mg via ORAL
  Filled 2017-02-01 (×2): qty 4

## 2017-02-01 MED ORDER — IMIPRAMINE HCL 50 MG PO TABS
100.0000 mg | ORAL_TABLET | Freq: Every day | ORAL | Status: DC
  Administered 2017-02-02: 100 mg via ORAL
  Filled 2017-02-01 (×2): qty 2

## 2017-02-01 MED ORDER — HYDROCODONE-ACETAMINOPHEN 5-325 MG PO TABS
1.0000 | ORAL_TABLET | Freq: Four times a day (QID) | ORAL | Status: DC | PRN
  Administered 2017-02-01 – 2017-02-02 (×3): 1 via ORAL
  Filled 2017-02-01 (×3): qty 1

## 2017-02-01 MED ORDER — LAMOTRIGINE 25 MG PO TABS
50.0000 mg | ORAL_TABLET | Freq: Every day | ORAL | Status: DC
  Administered 2017-02-01 – 2017-02-02 (×2): 50 mg via ORAL
  Filled 2017-02-01 (×2): qty 2

## 2017-02-01 MED ORDER — PNEUMOCOCCAL VAC POLYVALENT 25 MCG/0.5ML IJ INJ
0.5000 mL | INJECTION | INTRAMUSCULAR | Status: AC
  Administered 2017-02-02: 0.5 mL via INTRAMUSCULAR
  Filled 2017-02-01: qty 0.5

## 2017-02-01 MED ORDER — GABAPENTIN 100 MG PO CAPS
100.0000 mg | ORAL_CAPSULE | Freq: Three times a day (TID) | ORAL | Status: DC
Start: 2017-02-01 — End: 2017-02-03
  Administered 2017-02-01 – 2017-02-03 (×6): 100 mg via ORAL
  Filled 2017-02-01 (×7): qty 1

## 2017-02-01 NOTE — Plan of Care (Signed)
Problem: Activity: Goal: Risk for activity intolerance will decrease Outcome: Completed/Met Date Met: 02/01/17 Patient is ambulating in hall independently with steady gait. Tolerating activity without distress. Only c/o chronic back pain

## 2017-02-01 NOTE — Progress Notes (Signed)
  Echocardiogram 2D Echocardiogram has been performed.  Darlina Sicilian M 02/01/2017, 10:43 AM

## 2017-02-01 NOTE — Care Management Note (Signed)
Case Management Note  Patient Details  Name: Darlene Wilcox MRN: [id number] Date of Birth: 1960-10-04  Subjective/Objective:   From home with boyfriend, pta indep, presents with sepsis, hypotension, slurred speech, aki, psa,  Chf, back pain, bipolar d/o, incontinence, hld, anemia.  She has a new PCP that 's not listed.                Action/Plan: NCM will follow for dc needs.   Expected Discharge Date:                  Expected Discharge Plan:  Home/Self Care  In-House Referral:     Discharge planning Services  CM Consult  Post Acute Care Choice:    Choice offered to:     DME Arranged:    DME Agency:     HH Arranged:    HH Agency:     Status of Service:  In process, will continue to follow  If discussed at Long Length of Stay Meetings, dates discussed:    Additional Comments:  Zenon Mayo, RN 02/01/2017, 8:40 AM

## 2017-02-01 NOTE — Progress Notes (Signed)
FPTS Interim Progress Note  S: Patient feels well, AO x4, boyfriend to bring all meds today. They report that she would like to be FULL CODE. She thought that if she was on a ventilator her insurance would not cover that.   O: BP 115/75 (BP Location: Left Arm)   Pulse 86   Temp 98.2 F (36.8 C) (Oral)   Resp 11   Ht 5\' 1"  (1.549 m)   Wt 163 lb 1.6 oz (74 kg)   SpO2 100%   BMI 30.82 kg/m     A/P: Code status: Patient would like to be changed to FULL CODE.  -change code status order  Sela Hilding, MD 02/01/2017, 8:51 AM PGY-2, [facility] Service pager [phone]

## 2017-02-01 NOTE — Progress Notes (Signed)
Family Medicine Teaching Service Daily Progress Note Intern Pager: [phone]  Patient name: Darlene Wilcox Medical record number: [id number] Date of birth: Jul 24, 1960 Age: 56 y.o. Gender: female  Primary Care Provider: Patient, No Pcp Per Consultants: Infectious Disease Code Status: Partial  Pt Overview and Major Events to Date:  --Head CT 9/6 --CXR 9/6 --BCx 9/6 --Echo 9/6  Assessment and Plan: Darlene Wilcox is a 56 y.o. female presenting with hypotension, dizziness, and generalized weakness. PMH is significant for CHF, prolonged QT, Asthma, seizures, Bipolar disorder, GERD, and history of substance and alcohol abuse.    Hypotension: Differential includes dehydration, substance overdose (UDS + for cocaine and THC), sepsis, medication overuse (on gabapentin as well as 5 psych meds including klonipin QID). Given hypotension, bradycardia, and hypothermia, will treat as sepsis. With penicillin allergy, patient requires levaquin, aztreonam, and vanc per pharmacy with sepsis of unknown source order set. Clinically dry on exam, initial Cr 1.61, lactate 1.6. D dimer negative, making PE less likely. Trop negative, making ACS less likely. WBC 12.7. UA with leuks and trace bacteria, making UTI a possible cause. -vanc, levaquin, and aztreonam per pharmacy -cautious IVF given CHF with 50cc NS x 12 hours -Follow BCx, UCx, and Lactic Acid   Slurred speech: patient with slurred speech on exam while hypotensive. UDS + cocaine and THC. No additional neuro deficits, making stroke less likely. CT head without acute finding.  -continue to monitor -bedside swallow by RN    AKI: likely due to dehydration in the setting of hypotension as above. Cr 1.61, baseline is 0.59 from 08/2016. If not resolving, will consider FeUrea and renal US.  -CMP in am -gentle IVF   Substance Abuse: Patient endorses marijuana and cocaine use recently. Possible drug interaction with her current medications. Patient states  multiple times to multiple providers that she "needs her klonipin." -CIWA scoring   CHF: new dx 07/2016, echo from that admission with EF 40-45% and diffuse hypokinesis. Cath at that time with nonischemic cardiomyopathy (10% mid RCA). Home med is lasix 40mg  daily, toprol XL 25mg  daily. BNP 54. Clinically dry on exam. -hold toprol in the setting of sepsis -hold lasix in the setting of sepsis and dehydration   Back pain: patient endorses lumbar spinal back pain on exam, as well as paraspinal muscle tenderness. On chart review, hx of two back fusions (L2-5 2016 and L4-5 2008).  -XR lumbar spine -lidocaine patch -kpad -tylenol PRN -added Hydrocodone 5mg /Acetominophen 325mg  PRN Q6    HTN: cozaar, toprol are home meds.  -no antihypertensives while septic   Bipolar disorder: home meds are imipramine, prozac, lamictal, klonipin QID, topamax, geodon. Given hypotension with slurred speech and concern for medication overuse, will hold imipramine, prozac, geodon. Interestingly, no benzos noted on UDS. -hold geodon due to risk of QT elongation -continue topomax, imipramine, and prozac -consider psych consult for medication review -give reduced dose of klonipin to prevent withdrawal (0.5mg  BID rather than QID) -given use of klonipin, will order CIWA protocol for possible withdrawal -Resume home gabapentin   Incontinence: new home med is myrbetric.  -hold myrbetric while dehydrated    HLD: on gemfibrozil at home.  -continue gemfibrozil   Anemia: Hgb 9.7, baseline appears to be 12 on 08/2016. No signs of bleeding on exam. MCV normal. Patient endorses dark stool, no frank blood.  -repeat CBC in am -add on retic -FOBT -LDH, haptoglobin   FEN/GI: NPO Prophylaxis: SCDs given significant anemia   FEN/GI: NPO PPx: SCDs given significant anemia  Disposition: stable, close monitoring  Subjective:  Patient states she is feeling better this am. She states she is having back pain that was not  helped by the lidocaine patch or the K-pad. She has chronic back pain and history of two prior surgeries. She no longer endorses dizziness or weakness and states she feels like her speech is much better today than yesterday. She denies chest pain, SOB, difficulty breathing, congestion, runny nose, nausea, vomiting, or abdominal pain. I asked her about her menstrual history and she states she has not bleed since 1993 and no recent bleeding. She did state she has noticed some darker stools in the past two months.   Objective: Temp:  [95.6 F (35.3 C)-98.4 F (36.9 C)] 98.2 F (36.8 C) (09/07 0345) Pulse Rate:  [47-86] 86 (09/07 0345) Resp:  [11-22] 11 (09/07 0345) BP: (72-115)/(35-81) 115/75 (09/07 0345) SpO2:  [94 %-100 %] 100 % (09/07 0345) Weight:  [159 lb (72.1 kg)-163 lb 1.6 oz (74 kg)] 163 lb 1.6 oz (74 kg) (09/06 1841) Physical Exam: General: Alert and Oriented x 3, NAD Cardiovascular: RRR, normal S1, S2, no murmurs, no gallops, +2 pulses in dorsalis pedis bilaterally Respiratory: CTAB, no wheezes, no crackles Abdomen: non-distended, non-tender, soft, +BS in all four quadrants Extremities: moves all four extremities, no gross deformities  Laboratory:  Recent Labs Lab 01/31/17 1338 01/31/17 1411 02/01/17 0624  WBC 12.7*  --  6.8  HGB 9.7* 9.2* 10.2*  HCT 29.7* 27.0* 31.5*  PLT 206  --  182    Recent Labs Lab 01/31/17 1338 01/31/17 1411 01/31/17 1426  NA 139 141 141  K 3.8 3.8 3.8  CL 109 107 110  CO2 21*  --  21*  BUN 35* 33* 33*  CREATININE 1.61* 1.60* 1.44*  CALCIUM 8.5*  --  8.5*  PROT 5.3*  --  5.6*  BILITOT 0.9  --  0.7  ALKPHOS 63  --  77  ALT 10*  --  9*  AST 16  --  15  GLUCOSE 94 90 81    Troponin: <0.03 x 3 BCx: NGTD <24hrs UCx: NGTD multiple species present, suggest contamination B12: pending Folate: pending Iron, Ferritin, TIBC: pending Lactate Dehydrogenase: 120 Haptoglobin:   Imaging/Diagnostic Tests: -CT Head: No acute intracranial  abnormality or significant interval change. Stable mild white matter disease. The finding is nonspecific but can be seen in the setting of chronic microvascular ischemia, a demyelinating process such as multiple sclerosis, vasculitis, complicated migraine headaches, or as the sequelae of a prior infectious of a  inflammatory process. -CXR: No consolidation, effusion. Normal heart size, no pneumothorax. No active disease -Lumbar Spine XR: Impression - stable postsurgical changes from L3 through L5. No acute osseous abnormality. -EKG: sinus rhythm, diffuse T wave abnormalities, low voltage in precordial leads -ECHO: results pending  Darlene Alpha, DO 02/01/2017, 2:14 PM PGY-1, [facility] Intern pager: [phone], text pages welcome

## 2017-02-02 DIAGNOSIS — I959 Hypotension, unspecified: Secondary | ICD-10-CM

## 2017-02-02 DIAGNOSIS — N179 Acute kidney failure, unspecified: Secondary | ICD-10-CM

## 2017-02-02 DIAGNOSIS — F1410 Cocaine abuse, uncomplicated: Secondary | ICD-10-CM

## 2017-02-02 LAB — HAPTOGLOBIN: HAPTOGLOBIN: 142 mg/dL (ref 34–200)

## 2017-02-02 MED ORDER — ENOXAPARIN SODIUM 40 MG/0.4ML ~~LOC~~ SOLN
40.0000 mg | SUBCUTANEOUS | Status: DC
  Administered 2017-02-02: 40 mg via SUBCUTANEOUS
  Filled 2017-02-02: qty 0.4

## 2017-02-02 MED ORDER — ZIPRASIDONE HCL 80 MG PO CAPS
160.0000 mg | ORAL_CAPSULE | Freq: Every day | ORAL | Status: DC
  Administered 2017-02-02: 160 mg via ORAL
  Filled 2017-02-02 (×2): qty 2

## 2017-02-02 NOTE — Progress Notes (Addendum)
Family Medicine Teaching Service Daily Progress Note Intern Pager: [phone]  Patient name: Darlene Wilcox Medical record number: [id number] Date of birth: Mar 18, 1961 Age: 56 y.o. Gender: female  Primary Care Provider: Patient, No Pcp Per Consultants: None Code Status: Full  Assessment and Plan:  Darlene Wilcox a 56 y.o.femalepresenting with hypotension, dizziness, and generalized weakness. PMH is significant for CHF, prolonged QT, Asthma, seizures, Bipolar disorder, GERD, and history of substance and alcohol abuse.   Hypotension: Resolved Initially felt to be concerning for sepsis. However, no source of infection has been found and blood cultures with no growth to date. More likely secondary to substance overdose (UDS+ for cocaine and THC) and medication overdose (on numerous psychiatric medications). She does report taking her missed noon dose of medications with her night time dose of medications. Also reports being started on new medication by her psychiatrist at her most recent visit but is unable to recall what the medication name. She denies any cocaine use and reports it must have been a 'spiked joint.' she shared with friends. Overall, she has been clinically stable with normal vital signs. Will discontinue antibiotics. Transfer to med-surg bed for additional monitoring for 24 hours and likely discharge home tomorrow.   Slurred speech: Resolved. CT head in ED without acute findings. No focal deficits on exam. Likely 2/2 above.   AKI:  Likely pre-renal due to dehydration. Cr has trended down 1.6 > 1.0. Baseline 0.59 previously. Eating and drinking well. No need for further IVF or monitoring.   Substance Abuse:  UDS+ for THC and cocaine. She denies any cocaine use and reports it must have been a 'spiked joint.' she shared with friends. Reportedly stated multiple times to multiple providers that she "needs her klonipin." -CIWA scoring  CHF: new dx 07/2016, echo from that  admission with EF 40-45% and diffuse hypokinesis. Cath at that time with nonischemic cardiomyopathy (10% mid RCA).Home med is lasix 40mg  daily, toprol XL 25mg  daily. Will discontinue BB in the setting of cocaine abuse. Holding lasix as patient was volume down on admission and appears euvolemic today. Will monitor daily weights, I&O. Unclear dry weight. Weight at discharge last hospitalization 07/2016 was 169 lbs. Weight here on admission was 163 lbs. Repeat ECHO done yesterday shows improved EF 50-55%.   Back pain:  XR lumbar spine with stable postsurgical changes from L3-L5. No acute abnormalities. Back pain most likely muscular in nature. On lidocaine patch. Continue tylenol prn. D/C hydrocodone.   HTN: cozaar, toprol are home meds. BP stable off medications. Hold toprol with cocaine use. Resume cozaar when BP allows.   Bipolar disorder: Resume home medications -continue topomax, imipramine,  Geodon, and prozac -give reduced dose of klonipin to prevent withdrawal (0.5mg  BID rather than QID) -given use of klonipin, will order CIWA protocol for possible withdrawal -Resume home gabapentin  Incontinence: new home med is myrbetric.  -hold myrbetric while dehydrated   HLD: on gemfibrozil at home.  -continue gemfibrozil  Anemia: Hgb 9.7 > 10.2, baseline appears to be 12 on 08/2016. No signs of bleeding on exam. MCV normal. Patient endorses dark stool, no frank blood. FOBT negative. Iron studies negative.   FEN/GI: Regular Prophylaxis: SCDs order, patient not wearing. Start lovenox.  Disposition: likely discharge home tomorrow  Subjective:  Patient doing well without complaints this morning.    Objective: Temp:  [97.6 F (36.4 C)-98.5 F (36.9 C)] 97.7 F (36.5 C) (09/08 0740) Pulse Rate:  [66-104] 66 (09/08 0748) Resp:  [12-26] 13 (09/08  3254) BP: (114-145)/(63-103) 132/68 (09/08 0748) SpO2:  [96 %-100 %] 99 % (09/08 0748) Physical Exam: General: alert, pleasant female in no  acute distress Cardiovascular: RRR, no m/r/g Respiratory: CTAB Abdomen: soft, non-tender, non-distended, BS+  Laboratory:  Recent Labs Lab 01/31/17 1338 01/31/17 1411 02/01/17 0624  WBC 12.7*  --  6.8  HGB 9.7* 9.2* 10.2*  HCT 29.7* 27.0* 31.5*  PLT 206  --  182    Recent Labs Lab 01/31/17 1338 01/31/17 1411 01/31/17 1426 02/01/17 0624  NA 139 141 141 141  K 3.8 3.8 3.8 3.9  CL 109 107 110 111  CO2 21*  --  21* 22  BUN 35* 33* 33* 24*  CREATININE 1.61* 1.60* 1.44* 1.01*  CALCIUM 8.5*  --  8.5* 8.7*  PROT 5.3*  --  5.6* 5.6*  BILITOT 0.9  --  0.7 0.5  ALKPHOS 63  --  77 70  ALT 10*  --  9* 11*  AST 16  --  15 14*  GLUCOSE 94 90 81 94    Maryellen Pile, MD PGY3 02/02/2017, 9:14 AM [facility] Intern pager: [phone], text pages welcome

## 2017-02-02 NOTE — Progress Notes (Signed)
Patients boyfriend brought in all of her medications that were counted and taken to the pharmacy for storage until patient is discharged.

## 2017-02-02 NOTE — Progress Notes (Signed)
Patient arrived on unit via bed from 52M.  No family at bedside.

## 2017-02-03 NOTE — Progress Notes (Signed)
Family Medicine Teaching Service Daily Progress Note Intern Pager: [phone]  Patient name: OVIDA DELAGARZA Medical record number: [id number] Date of birth: 26-Aug-1960 Age: 56 y.o. Gender: female  Primary Care Provider: Patient, No Pcp Per Consultants: none Code Status: FULL   Assessment and Plan:  Darlene Wilcox a 56 y.o.femalepresenting with hypotension, dizziness, and generalized weakness. PMH is significant for CHF, prolonged QT, Asthma, seizures, Bipolar disorder, GERD, and history of substance and alcohol abuse.   Hypotension likely secondary to acute delerium or intoxicatoin: Resolved Likely secondary to acute delerium or intoxication with THC, cocaine, and multiple psych medications. She also reports today that she was recently started on a new blood pressure medication (unable to recall what was started) that may have contributed to her hypotension. Infectious work-up has been negative and blood cultures are with no growth at 3 days. Re-started her home psych medication yesterday. Continues to be stable. No complaints today other than some mild generalized weakness/fatigue. Will discontinue her home BB given her cocaine use; if started recently may be contributing to her generalized fatigue Will plan for discharge today with follow up with her PCP and psychiatrist for further management of her home medications.  AKI:  Resolved. Likely 2/2 dehydration. Repeat BMET at follow up with PCP.   Substance Abuse:  UDS+ for THC and cocaine. She denies any cocaine use and reports it must have been a 'spiked joint.' she shared with friends. Reportedly stated multiple times to multiple providers that she "needs her klonipin."   CHF: new dx 07/2016, echo from that admission with EF 40-45% and diffuse hypokinesis. Cath at that time with nonischemic cardiomyopathy (10% mid RCA).Home med is lasix 40mg  daily, toprol XL 25mg  daily. Have discontinued BB in the setting of cocaine abuse.  Holding lasix as patient was volume down on admission and appears euvolemic today. Unclear dry weight. Weight at discharge last hospitalization 07/2016 was 169 lbs. Weight here on admission was 163 lbs. Repeat ECHO shows improved EF 50-55%.   HTN: cozaar, toprol are home meds. BP stable off medications. Hold toprol with cocaine use. Will hold cozaar at discharge given low BP. Follow up with PCP for further managment.   Bipolar disorder: Resumed home medications -continue topomax, imipramine,  Geodon, and prozac -give reduced dose of klonipin to prevent withdrawal (0.5mg  BID rather than QID) -given use of klonipin, will order CIWA protocol for possible withdrawal -Resumehome gabapentin  Incontinence: new home med is myrbetric.  -hold myrbetric at discharge  HLD: on gemfibrozil at home.  -continue gemfibrozil  Anemia: Hgb 9.7 > 10.2, baseline appears to be 12 on 08/2016. No signs of bleeding on exam. MCV normal. Patient endorses dark stool, no frank blood. FOBT negative. Iron studies negative. Follow up outpatient.    FEN/GI: Regular PPx: Lovenox  Disposition: Discharge home today  Subjective:  Doing well this morning. Complaints of some generalized weakness/fatigue but improved since admission. Denies any chest pain or shortness of breath. Agreeable to discharge home today. Reports she can follow up with her PCP and her psychiatrist in the next 1-2 weeks.   Objective: Temp:  [97.5 F (36.4 C)-98.4 F (36.9 C)] 98.4 F (36.9 C) (09/09 0927) Pulse Rate:  [78-90] 86 (09/09 0927) Resp:  [17-20] 18 (09/09 0927) BP: (109-118)/(64-74) 116/70 (09/09 0927) SpO2:  [97 %-98 %] 98 % (09/09 0951) Weight:  [163 lb 9.3 oz (74.2 kg)] 163 lb 9.3 oz (74.2 kg) (09/08 2112) Physical Exam: General:well developed, well nourished female in no acute distress  Cardiovascular: RRR, no m/r/g Respiratory: CTAB Abdomen: soft, non-tender, BS+  Laboratory:  Recent Labs Lab 01/31/17 1338  01/31/17 1411 02/01/17 0624  WBC 12.7*  --  6.8  HGB 9.7* 9.2* 10.2*  HCT 29.7* 27.0* 31.5*  PLT 206  --  182    Recent Labs Lab 01/31/17 1338 01/31/17 1411 01/31/17 1426 02/01/17 0624  NA 139 141 141 141  K 3.8 3.8 3.8 3.9  CL 109 107 110 111  CO2 21*  --  21* 22  BUN 35* 33* 33* 24*  CREATININE 1.61* 1.60* 1.44* 1.01*  CALCIUM 8.5*  --  8.5* 8.7*  PROT 5.3*  --  5.6* 5.6*  BILITOT 0.9  --  0.7 0.5  ALKPHOS 63  --  77 70  ALT 10*  --  9* 11*  AST 16  --  15 14*  GLUCOSE 94 90 81 94    Maryellen Pile, MD PGY-3 02/03/2017, 11:24 AM [facility] Intern pager: [phone], text pages welcome

## 2017-02-03 NOTE — Discharge Instructions (Signed)
Darlene Wilcox,  I think your symptoms were from a combination of being dehydrated and your medications. I am stopping several of your medications on discharge. Please refer to your medication list provided at discharge for guidance on what medications to take at home after you leave the hospital. It will be important for you to follow up with your regular primary care provider in the next 1-2 weeks for follow up. I would also like for you to follow up with your psychiatrist for further management of your bipolar medications.

## 2017-02-03 NOTE — Progress Notes (Signed)
Discharge instructions and medications discussed with patient.  AVS given to patient.  All questions answered.  

## 2017-02-04 NOTE — Discharge Summary (Signed)
[facility] Discharge Summary  Patient name: Darlene Wilcox Medical record number: [id number] Date of birth: 28-Feb-1961 Age: 56 y.o. Gender: female Date of Admission: 01/31/2017  Date of Discharge: 02/03/2017 Admitting Physician: Sela Hilding, MD  Primary Care Provider: Patient, No Pcp Per Consultants: none  Indication for Hospitalization:  Hypotension with concern for AMS  Discharge Diagnoses/Problem List:  Hypotension AKI 2/2 to dehydration Substance Use Disorder Bipolar Disorder HTN CHF HLD Anemia  Disposition: home  Discharge Condition: stable  Discharge Exam:   General:well developed, well nourished female in no acute distress HEENT: NCAT, PERRL, EOMI Cardiovascular: RRR, no m/r/g Respiratory: CTAB, no wheezing Abdomen: soft, non-tender, BS+  Brief Hospital Course:  Darlene Wilcox 55y/o female who presented to the ED because she had been feeling "weak" and dizzy on standing up. She was found to be hypotensive in the ED that was refractory to 1.6L of NS bolus. Her home medications were withheld and a CT Head was done that showed no acute intracranial abnormality due to concern for weakness in her extremities, slow speech, and possible slow and/or altered mentation. She was admitted and on 9/7 she was found to be improved with normalized speech, improved strength, and mentation back to her baseline. Her urince and blood cultures returned negative for growth and her antibiotic empiric coverage was stopped. She stated that she was able to ambulate without any dizziness. She did have complaints of back pain that was improved with a K-pad and oral pain medication. Due to her improved symptoms, normalized vital signs, no sign of infection, negative imaging for any acute process, and normalized labs it was decided her temporary condition was likely due to drug related. On 9/9 she had no complaints other than generalized weakness which she had  endorsed she had for several weeks before any of her acute symptoms. She was discharged home.  Issues for Follow Up:  1. Needs close follow up with PCP given her history of substance abuse. Consider a slow taper off klonopin.  2. Discontinued beta blocker due to cocaine use. 3. Please consider an alternative to Geodon due to history of long QT. This medication was held during her hospital stay.  Significant Procedures:   9/6 - CT Head w/o Contrast:  IMPRESSION: 1. No acute intracranial abnormality or significant interval change. 2. Stable mild white matter disease. The finding is nonspecific but can be seen in the setting of chronic microvascular ischemia, a demyelinating process such as multiple sclerosis, vasculitis, complicated migraine headaches, or as the sequelae of a prior infectious or inflammatory process.  9/6 - Lumbar Spine X-ray: IMPRESSION: Stable postsurgical changes from L3 through L5. No acute osseous Abnormality.  9/6 - CXR: IMPRESSION: No active disease.  Significant Labs and Imaging:   Recent Labs Lab 01/31/17 1338 01/31/17 1411 02/01/17 0624  WBC 12.7*  --  6.8  HGB 9.7* 9.2* 10.2*  HCT 29.7* 27.0* 31.5*  PLT 206  --  182    Recent Labs Lab 01/31/17 1338 01/31/17 1411 01/31/17 1426 02/01/17 0624  NA 139 141 141 141  K 3.8 3.8 3.8 3.9  CL 109 107 110 111  CO2 21*  --  21* 22  GLUCOSE 94 90 81 94  BUN 35* 33* 33* 24*  CREATININE 1.61* 1.60* 1.44* 1.01*  CALCIUM 8.5*  --  8.5* 8.7*  ALKPHOS 63  --  77 70  AST 16  --  15 14*  ALT 10*  --  9* 11*  ALBUMIN  3.1*  --  3.1* 2.9*   9/6 - D-dimer: 0.28 9/6 - U/A: Trace Leukocytes and rare bacteria 9/6 - Urine Drug Screen: positive for cannabis and cocaine 9/6 - UCx: contaminated 9/6 - BCx: negative 9/6 - Ethanol: <5 9/6 - FOBT: negative 9/6 - Lactate: 120 9/6 - Haptoglobin: 142 9/7 - Troponin: < 0.03 x 3 9/7 - Vit B12: 285 9/7 - Folate: 23.5 9/7 - Iron Panel: Ferritin 159, Iron 118, TIBC  323  Results/Tests Pending at Time of Discharge:   none  Discharge Medications:  Allergies as of 02/03/2017      Reactions   Apple Anaphylaxis, Shortness Of Breath   Penicillins Anaphylaxis, Shortness Of Breath, Itching, Swelling   Has patient had a PCN reaction causing immediate rash, facial/tongue/throat swelling, SOB or lightheadedness with hypotension: Unknown, childhood reaction Has patient had a PCN reaction causing severe rash involving mucus membranes or skin necrosis: No Has patient had a PCN reaction that required hospitalization No Has patient had a PCN reaction occurring within the last 10 years: No If all of the above answers are "NO", then may proceed with Cephalosporin use.   Bee Venom Swelling   Takes benadryl to help with reaction      Medication List    STOP taking these medications   furosemide 40 MG tablet Commonly known as:  LASIX   losartan 25 MG tablet Commonly known as:  COZAAR   metoprolol succinate 25 MG 24 hr tablet Commonly known as:  TOPROL-XL   MYRBETRIQ 50 MG Tb24 tablet Generic drug:  mirabegron ER   naproxen 500 MG tablet Commonly known as:  NAPROSYN   potassium chloride SA 20 MEQ tablet Commonly known as:  K-DUR,KLOR-CON     TAKE these medications   ascorbic acid 500 MG tablet Commonly known as:  VITAMIN C TAKE (3) TABLETS BY MOUTH ONCE DAILY. What changed:  See the new instructions.   Blood Press Monitor/M-L Cuff Misc 1 each by Does not apply route daily.   CALCIUM CITRATE + D3 PO Take 1 tablet by mouth daily.   clonazePAM 1 MG tablet Commonly known as:  KLONOPIN Take 1 tablet (1 mg total) by mouth 3 (three) times daily. What changed:  when to take this   diclofenac sodium 1 % Gel Commonly known as:  VOLTAREN APPLY 2 GRAMS TO AFFECTED AREAS(S) 4 TIMES DAILY.   esomeprazole 40 MG capsule Commonly known as:  NEXIUM TAKE 1 CAPSULE BY MOUTH ONCE A DAY. What changed:  See the new instructions.   FLOVENT HFA 110 MCG/ACT  inhaler Generic drug:  fluticasone INHALE 2 PUFFS TWICE DAILY.   FLUoxetine 40 MG capsule Commonly known as:  PROZAC Take 80 mg by mouth daily.   gabapentin 300 MG capsule Commonly known as:  NEURONTIN TAKE 1 CAPSULE AT BEDTIME X3DAYS, INCREASE TO 1 CAP TWICE A DAY X3DAYS, THEN 1 CAP THREE TIMES A DAY What changed:  See the new instructions.   gemfibrozil 600 MG tablet Commonly known as:  LOPID TAKE 1 TABLET (600 MG TOTAL) BY MOUTH 2 (TWO) TIMES DAILY BEFORE A MEAL.   imipramine 50 MG tablet Commonly known as:  TOFRANIL TAKE 2 TABLETS BY MOUTH AT BEDTIME. What changed:  See the new instructions.   lamoTRIgine 25 MG tablet Commonly known as:  LAMICTAL Take 50 mg by mouth at bedtime.   multivitamin with minerals Tabs tablet Take 1 tablet by mouth daily.   nicotine 21 mg/24hr patch Commonly known as:  NICODERM CQ -  dosed in mg/24 hours Place 1 patch (21 mg total) onto the skin daily.   omega-3 acid ethyl esters 1 g capsule Commonly known as:  LOVAZA TAKE 1 CAPSULE BY MOUTH TWICE DAILY. What changed:  See the new instructions.   OVER THE COUNTER MEDICATION Take 1 lozenge by mouth as needed (smoking cessation). Nicotine lozenge   polyethylene glycol powder powder Commonly known as:  GLYCOLAX/MIRALAX MIX (1 CAPFUL) 17 GRAMS IN 8 OZ OF JUICE OR WATER AND DRINK TWICE A DAY AS NEEDED FOR CONSTIPATION.   PROAIR HFA 108 (90 Base) MCG/ACT inhaler Generic drug:  albuterol INHALE 2 PUFFS INTO THE LUNGS EVERY 4 HOURS AS NEEDED FOR WHEEZING ORSHORTNESS OF BREATH.   topiramate 200 MG tablet Commonly known as:  TOPAMAX Take 200 mg by mouth at bedtime.   ziprasidone 80 MG capsule Commonly known as:  GEODON Take 160 mg by mouth at bedtime.            Discharge Care Instructions        Start     Ordered   02/03/17 0000  Increase activity slowly     02/03/17 1137   02/03/17 0000  Diet - low sodium heart healthy     02/03/17 1137      Discharge Instructions: Please  refer to Patient Instructions section of EMR for full details.  Patient was counseled important signs and symptoms that should prompt return to medical care, changes in medications, dietary instructions, activity restrictions, and follow up appointments.   Follow-Up Appointments: Follow-up Information    Your primary care provider Follow up in 1 week(s).           Nuala Alpha, DO 02/04/2017, 11:21 PM PGY-1, [place]

## 2017-02-05 LAB — CULTURE, BLOOD (ROUTINE X 2)
CULTURE: NO GROWTH
CULTURE: NO GROWTH
Special Requests: ADEQUATE
Special Requests: ADEQUATE

## 2017-02-13 ENCOUNTER — Other Ambulatory Visit: Payer: Self-pay | Admitting: Family Medicine

## 2017-02-15 ENCOUNTER — Other Ambulatory Visit: Payer: Self-pay | Admitting: Family Medicine

## 2017-02-20 ENCOUNTER — Ambulatory Visit: Payer: Medicare Other | Admitting: Cardiovascular Disease

## 2017-08-07 IMAGING — DX DG CHEST 2V
2 series · 2 of 2 positions shown · non-contrast
Comparison: 08/05/2016

CLINICAL DATA: Shortness of breath and cough

EXAM:
CHEST  2 VIEW

[chest pa]
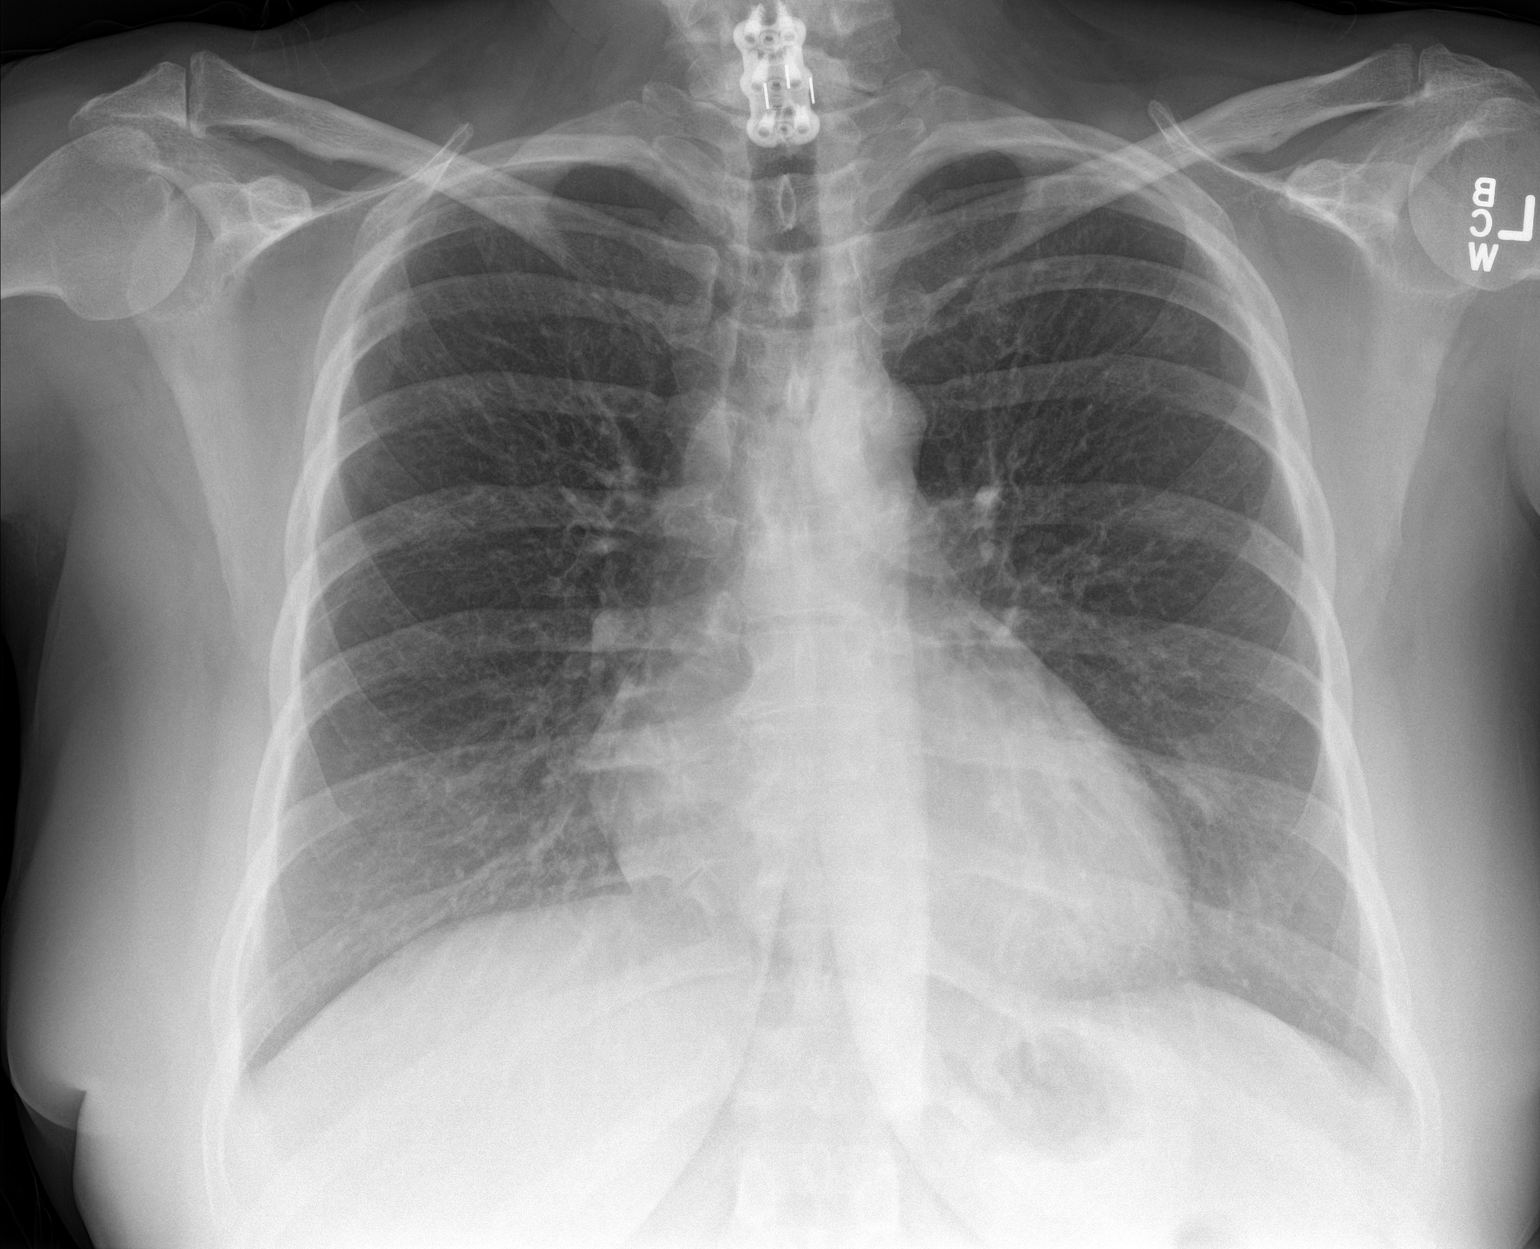

[chest lat]
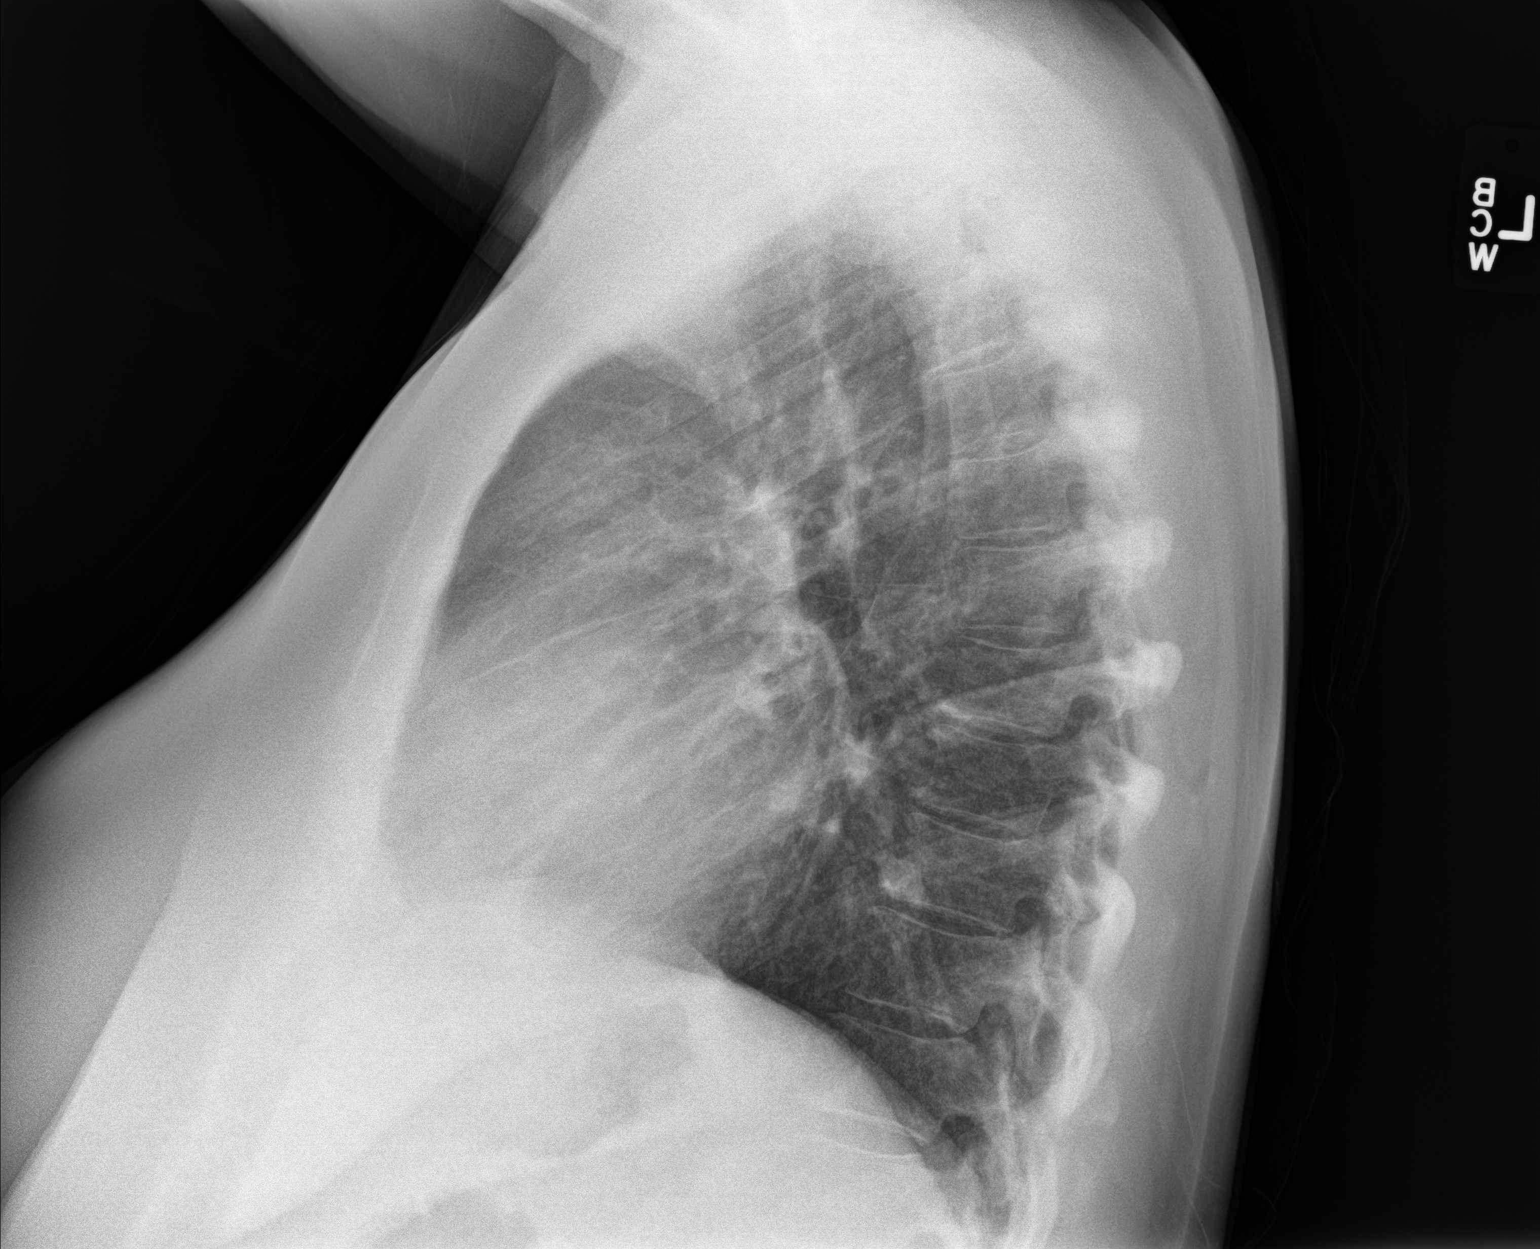

[2 of 2 positions shown; findings below may reference images not displayed]

FINDINGS: Cardiac shadow is within normal limits. Postoperative changes in the
cervical spine are noted. The lungs are well aerated bilaterally. No
focal infiltrate or sizable effusion is seen. Previously seen
vascular congestion interstitial changes have resolved in the
interval. No new focal infiltrate is noted.
IMPRESSION: Resolution of previously seen changes without acute abnormality.

## 2018-03-14 ENCOUNTER — Telehealth: Payer: Self-pay

## 2018-03-14 NOTE — Telephone Encounter (Signed)
Moving back to [place]  Wants an appt.

## 2018-06-24 ENCOUNTER — Encounter (HOSPITAL_COMMUNITY): Payer: Self-pay | Admitting: Emergency Medicine

## 2018-06-24 ENCOUNTER — Other Ambulatory Visit: Payer: Self-pay

## 2018-06-24 ENCOUNTER — Emergency Department (HOSPITAL_COMMUNITY): Payer: Medicare PPO

## 2018-06-24 ENCOUNTER — Emergency Department (HOSPITAL_COMMUNITY)
Admission: EM | Admit: 2018-06-24 | Discharge: 2018-06-24 | Disposition: A | Discharge status: Home / Self Care | Payer: Medicare PPO | Attending: Emergency Medicine | Admitting: Emergency Medicine

## 2018-06-24 DIAGNOSIS — F17210 Nicotine dependence, cigarettes, uncomplicated: Secondary | ICD-10-CM | POA: Insufficient documentation

## 2018-06-24 DIAGNOSIS — R079 Chest pain, unspecified: Secondary | ICD-10-CM | POA: Diagnosis present

## 2018-06-24 DIAGNOSIS — F419 Anxiety disorder, unspecified: Secondary | ICD-10-CM | POA: Diagnosis not present

## 2018-06-24 DIAGNOSIS — F1410 Cocaine abuse, uncomplicated: Secondary | ICD-10-CM | POA: Diagnosis not present

## 2018-06-24 DIAGNOSIS — Z7982 Long term (current) use of aspirin: Secondary | ICD-10-CM | POA: Diagnosis not present

## 2018-06-24 DIAGNOSIS — Z79899 Other long term (current) drug therapy: Secondary | ICD-10-CM | POA: Diagnosis not present

## 2018-06-24 DIAGNOSIS — R0789 Other chest pain: Secondary | ICD-10-CM | POA: Diagnosis not present

## 2018-06-24 DIAGNOSIS — I5020 Unspecified systolic (congestive) heart failure: Secondary | ICD-10-CM | POA: Insufficient documentation

## 2018-06-24 DIAGNOSIS — F319 Bipolar disorder, unspecified: Secondary | ICD-10-CM | POA: Diagnosis not present

## 2018-06-24 DIAGNOSIS — J45909 Unspecified asthma, uncomplicated: Secondary | ICD-10-CM | POA: Diagnosis not present

## 2018-06-24 LAB — CBC
HCT: 43 % (ref 36.0–46.0)
HEMOGLOBIN: 14.1 g/dL (ref 12.0–15.0)
MCH: 27.8 pg (ref 26.0–34.0)
MCHC: 32.8 g/dL (ref 30.0–36.0)
MCV: 84.8 fL (ref 80.0–100.0)
NRBC: 0 % (ref 0.0–0.2)
Platelets: 212 10*3/uL (ref 150–400)
RBC: 5.07 MIL/uL (ref 3.87–5.11)
RDW: 12.9 % (ref 11.5–15.5)
WBC: 9.5 10*3/uL (ref 4.0–10.5)

## 2018-06-24 LAB — BASIC METABOLIC PANEL
Anion gap: 9 (ref 5–15)
BUN: 9 mg/dL (ref 6–20)
CO2: 21 mmol/L — ABNORMAL LOW (ref 22–32)
Calcium: 8.9 mg/dL (ref 8.9–10.3)
Chloride: 109 mmol/L (ref 98–111)
Creatinine, Ser: 0.7 mg/dL (ref 0.44–1.00)
GFR calc non Af Amer: >60 mL/min (ref >60)
Glucose, Bld: 91 mg/dL (ref 70–99)
POTASSIUM: 3.5 mmol/L (ref 3.5–5.1)
SODIUM: 139 mmol/L (ref 135–145)

## 2018-06-24 LAB — HEPATIC FUNCTION PANEL
ALT: 24 U/L (ref 0–44)
AST: 26 U/L (ref 15–41)
Albumin: 4 g/dL (ref 3.5–5.0)
Alkaline Phosphatase: 105 U/L (ref 38–126)
BILIRUBIN DIRECT: 0.1 mg/dL (ref 0.0–0.2)
BILIRUBIN TOTAL: 0.3 mg/dL (ref 0.3–1.2)
Indirect Bilirubin: 0.2 mg/dL — ABNORMAL LOW (ref 0.3–0.9)
Total Protein: 6.8 g/dL (ref 6.5–8.1)

## 2018-06-24 LAB — I-STAT BETA HCG BLOOD, ED (NOT ORDERABLE): I-stat hCG, quantitative: 13.6 m[IU]/mL — ABNORMAL HIGH (ref <5)

## 2018-06-24 LAB — DIFFERENTIAL
BASOS ABS: 0 10*3/uL (ref 0.0–0.1)
Basophils Relative: 0 %
Eosinophils Absolute: 0.2 10*3/uL (ref 0.0–0.5)
Eosinophils Relative: 2 %
LYMPHS ABS: 3.1 10*3/uL (ref 0.7–4.0)
Lymphocytes Relative: 32 %
MONOS PCT: 7 %
Monocytes Absolute: 0.6 10*3/uL (ref 0.1–1.0)
NEUTROS ABS: 5.8 10*3/uL (ref 1.7–7.7)
Neutrophils Relative %: 60 %

## 2018-06-24 LAB — POCT I-STAT TROPONIN I
Troponin i, poc: 0.01 ng/mL (ref 0.00–0.08)
Troponin i, poc: 0 ng/mL (ref 0.00–0.08)

## 2018-06-24 LAB — TROPONIN I: Troponin I: <0.03 ng/mL (ref <0.03)

## 2018-06-24 MED ORDER — DOXYCYCLINE HYCLATE 100 MG PO CAPS: 100.0000 mg | ORAL_CAPSULE | Freq: Two times a day (BID) | ORAL | 0 refills | Status: DC

## 2018-06-24 MED ORDER — CLONAZEPAM 1 MG PO TABS: 0.5000 mg | ORAL_TABLET | Freq: Three times a day (TID) | ORAL | 0 refills | Status: DC | PRN

## 2018-06-24 MED ORDER — SODIUM CHLORIDE 0.9% FLUSH
3.0000 mL | Freq: Once | INTRAVENOUS | Status: AC
  Administered 2018-06-24: 3 mL via INTRAVENOUS

## 2018-06-24 MED ORDER — HYDROCODONE-ACETAMINOPHEN 5-325 MG PO TABS
1.0000 | ORAL_TABLET | Freq: Once | ORAL | Status: AC
  Administered 2018-06-24: 1 via ORAL
  Filled 2018-06-24: qty 1

## 2018-06-24 MED ORDER — HYDROCODONE-ACETAMINOPHEN 5-325 MG PO TABS: 1.0000 | ORAL_TABLET | Freq: Four times a day (QID) | ORAL | 0 refills | Status: DC | PRN

## 2018-06-24 MED ORDER — MORPHINE SULFATE (PF) 4 MG/ML IV SOLN
4.0000 mg | Freq: Once | INTRAVENOUS | Status: AC
  Administered 2018-06-24: 4 mg via INTRAVENOUS
  Filled 2018-06-24: qty 1

## 2018-06-24 NOTE — ED Triage Notes (Addendum)
Pt C/O chest pain, cough, back pain, and weakness that started 4 days ago. Pt states she has been unable to fill any of her prescriptions for 5-6 days. Pt has taken 1 asprin and given 1 nitro en route via EMS with relief.

## 2018-06-24 NOTE — Discharge Instructions (Addendum)
Follow-up with your family doctor next week as planned 

## 2018-06-24 NOTE — ED Provider Notes (Signed)
[facility] EMERGENCY DEPARTMENT Provider Note   CSN: [id number] Arrival date & time: 06/24/18  1921     History   Chief Complaint Chief Complaint  Patient presents with  . Chest Pain    HPI Darlene Wilcox is a 58 y.o. female.  Patient complains of cough yellow sputum production and left-sided chest pain.  The history is provided by the patient. No language interpreter was used.  Chest Pain  Pain location:  L chest Pain quality: aching   Pain radiates to:  Does not radiate Pain severity:  Moderate Onset quality:  Sudden Timing:  Constant Progression:  Worsening Chronicity:  New Context: not breathing   Relieved by:  Nothing Worsened by:  Nothing Associated symptoms: cough   Associated symptoms: no abdominal pain, no back pain, no fatigue and no headache     Past Medical History:  Diagnosis Date  . Anxiety   . Asthma    daily inhaler  . Bipolar disorder ([place])   . Bronchitis due to tobacco use 07/2016  . 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 Chronic back pain greater than 3 months duration   . Depression   . Full dentures   . GERD (gastroesophageal reflux disease)   . Headache(784.0)    1-2 x/month  . History of ankle sprain    right; 2 weeks ago;  c/o severe pain  . Seizures ([place])    states "silent seizures"; is on anticonvulsant; none in 2-3 mos.    Patient Active Problem List   Diagnosis Date Noted  . Cocaine abuse ([place])   . Anemia   . Hypotension 01/31/2017  . AKI (acute kidney injury) ([place])   . Sepsis ([place])   . Slurred speech   . Acute hypoxemic respiratory failure ([place]) 08/12/2016  . Superficial thrombophlebitis 08/12/2016  . Acute congestive heart failure ([place]) 08/07/2016  . Acute systolic CHF (congestive heart failure) ([place])   . Chest pain 08/06/2016  . Prolonged QT interval 08/06/2016  . Volume overload 08/05/2016  . Contusion of hip 05/08/2016  . Asthma, chronic 08/07/2014  . Seizures ([place]) 02/23/2014  . Bipolar disorder  ([place]) 11/13/2006  . DISORDER, TOBACCO USE 11/13/2006  . GERD 11/13/2006  . BACK PAIN, CHRONIC 11/13/2006  . HX, PERSONAL, ALCOHOLISM 11/13/2006    Past Surgical History:  Procedure Laterality Date  . ABDOMINAL HYSTERECTOMY     partial  . APPENDECTOMY    . BUNIONECTOMY Left   . CARPAL TUNNEL RELEASE Right 01/13/2013   Procedure: CARPAL TUNNEL RELEASE;  Surgeon: Cammie Sickle., MD;  Location: [facility];  Service: Orthopedics;  Laterality: Right;  . CERVICAL FUSION     x 2  . DIAGNOSTIC LAPAROSCOPY  11/08/2006  . INGUINAL HERNIA REPAIR Right   . LAPAROSCOPIC APPENDECTOMY  11/08/2006  . LEFT HEART CATH AND CORONARY ANGIOGRAPHY N/A 08/08/2016   Procedure: Left Heart Cath and Coronary Angiography;  Surgeon: Wellington Hampshire, MD;  Location: [facility];  Service: Cardiovascular;  Laterality: N/A;  . LUMBAR FUSION  06/28/2014   L2-5  . LUMBAR LAMINECTOMY  02/05/2007   L4-5 fusion  . TUBAL LIGATION       OB History   No obstetric history on file.      Home Medications    Prior to Admission medications   Medication Sig Start Date End Date Taking? Authorizing Provider  aspirin 325 MG tablet Take 325 mg by mouth daily.   Yes [provider]  buPROPion (ZYBAN) 150 MG 12 hr tablet Take 150 mg by mouth 2 (two) times daily.   Yes [provider]  busPIRone (BUSPAR) 30 MG tablet Take 15 mg by mouth 3 (three) times daily.   Yes [provider]  FLUoxetine (PROZAC) 20 MG capsule Take 60 mg by mouth daily.   Yes [provider]  gabapentin (NEURONTIN) 400 MG capsule Take 400 mg by mouth 3 (three) times daily.   Yes [provider]  Multiple Vitamin (MULTIVITAMIN WITH MINERALS) TABS tablet Take 1 tablet by mouth daily.   Yes [provider]  topiramate (TOPAMAX) 100 MG tablet Take 100 mg by mouth 2 (two) times daily.  03/31/14  Yes Challa, Wallace Cullens, MD  traMADol (ULTRAM) 50 MG tablet Take 50 mg by mouth daily as needed  for moderate pain.   Yes [provider]  traZODone (DESYREL) 50 MG tablet Take 50 mg by mouth at bedtime.   Yes [provider]  ziprasidone (GEODON) 80 MG capsule Take 80 mg by mouth 2 (two) times daily.  03/29/13  Yes Challa, Wallace Cullens, MD  clonazePAM (KLONOPIN) 1 MG tablet Take 0.5 tablets (0.5 mg total) by mouth 3 (three) times daily as needed for anxiety. 06/24/18   Milton Ferguson, MD  doxycycline (VIBRAMYCIN) 100 MG capsule Take 1 capsule (100 mg total) by mouth 2 (two) times daily. One po bid x 7 days 06/24/18   Milton Ferguson, MD  esomeprazole (NEXIUM) 40 MG capsule TAKE 1 CAPSULE BY MOUTH ONCE A DAY. Patient not taking: Reported on 06/24/2018 10/24/16   Claretta Fraise, MD  FLOVENT HFA 110 MCG/ACT inhaler INHALE 2 PUFFS TWICE DAILY. Patient not taking: Reported on 06/24/2018 10/24/16   Claretta Fraise, MD  HYDROcodone-acetaminophen (NORCO/VICODIN) 5-325 MG tablet Take 1 tablet by mouth every 6 (six) hours as needed for moderate pain. 06/24/18   Milton Ferguson, MD  losartan (COZAAR) 25 MG tablet Take 25 mg by mouth daily.    [provider]  mirabegron ER (MYRBETRIQ) 50 MG TB24 tablet Take 50 mg by mouth daily.    [provider]  PROAIR HFA 108 (90 Base) MCG/ACT inhaler INHALE 2 PUFFS INTO THE LUNGS EVERY 4 HOURS AS NEEDED FOR WHEEZING ORSHORTNESS OF BREATH. Patient not taking: Reported on 06/24/2018 09/19/16   Claretta Fraise, MD  metolazone (ZAROXOLYN) 2.5 MG tablet Take 1 tablet (2.5 mg total) by mouth daily. Patient not taking: Reported on 04/09/2014 10/12/13 04/10/14  Lysbeth Penner, FNP    Family History Family History  Problem Relation Age of Onset  . Lung cancer Father   . Diabetes Maternal Grandmother   . Heart attack Maternal Grandmother   . Deep vein thrombosis Neg Hx     Social History Social History   Tobacco Use  . Smoking status: Current Some Day Smoker    Packs/day: 1.00    Years: 20.00    Pack years: 20.00    Types: Cigarettes  .  Smokeless tobacco: Never Used  . Tobacco comment:  patient states smokes only 5 cigarettes per day  Substance Use Topics  . Alcohol use: No    Alcohol/week: 0.0 standard drinks  . Drug use: No     Allergies   Apple; Penicillins; and Bee venom   Review of Systems Review of Systems  Constitutional: Negative for appetite change and fatigue.  HENT: Negative for congestion, ear discharge and sinus pressure.   Eyes: Negative for discharge.  Respiratory: Positive for cough.   Cardiovascular: Positive  for chest pain.  Gastrointestinal: Negative for abdominal pain and diarrhea.  Genitourinary: Negative for frequency and hematuria.  Musculoskeletal: Negative for back pain.  Skin: Negative for rash.  Neurological: Negative for seizures and headaches.  Psychiatric/Behavioral: Negative for hallucinations.     Physical Exam Updated Vital Signs BP (!) 143/84   Pulse 70   Temp 98.2 F (36.8 C) (Oral)   Resp 15   SpO2 100%   Physical Exam Vitals signs and nursing note reviewed.  Constitutional:      Appearance: She is well-developed.  HENT:     Head: Normocephalic.     Nose: Nose normal.  Eyes:     General: No scleral icterus.    Conjunctiva/sclera: Conjunctivae normal.  Neck:     Musculoskeletal: Neck supple.     Thyroid: No thyromegaly.  Cardiovascular:     Rate and Rhythm: Normal rate and regular rhythm.     Heart sounds: No murmur. No friction rub. No gallop.   Pulmonary:     Breath sounds: No stridor. Wheezing present. No rales.  Chest:     Chest wall: No tenderness.  Abdominal:     General: There is no distension.     Tenderness: There is no abdominal tenderness. There is no rebound.  Musculoskeletal: Normal range of motion.  Lymphadenopathy:     Cervical: No cervical adenopathy.  Skin:    Findings: No erythema or rash.  Neurological:     Mental Status: She is alert and oriented to person, place, and time.     Motor: No abnormal muscle tone.     Coordination:  Coordination normal.  Psychiatric:        Behavior: Behavior normal.      ED Treatments / Results  Labs (all labs ordered are listed, but only abnormal results are displayed) Labs Reviewed  BASIC METABOLIC PANEL - Abnormal; Notable for the following components:      Result Value   CO2 21 (*)    All other components within normal limits  HEPATIC FUNCTION PANEL - Abnormal; Notable for the following components:   Indirect Bilirubin 0.2 (*)    All other components within normal limits  I-STAT BETA HCG BLOOD, ED (NOT ORDERABLE) - Abnormal; Notable for the following components:   I-stat hCG, quantitative 13.6 (*)    All other components within normal limits  CBC  DIFFERENTIAL  TROPONIN I  I-STAT BETA HCG BLOOD, ED ([facility], [facility], [facility] ONLY)  POCT I-STAT TROPONIN I  I-STAT TROPONIN, ED  POCT I-STAT TROPONIN I    EKG None  Radiology Dg Chest 2 View  Result Date: 06/24/2018 CLINICAL DATA:  Acute chest pain EXAM: CHEST - 2 VIEW COMPARISON:  01/31/2017 and prior exams FINDINGS: The cardiomediastinal silhouette is unremarkable. There is no evidence of focal airspace disease, pulmonary edema, suspicious pulmonary nodule/mass, pleural effusion, or pneumothorax. No acute bony abnormalities are identified. Cervical fusion hardware again noted. IMPRESSION: No active cardiopulmonary disease. Electronically Signed   By: Margarette Canada M.D.   On: 06/24/2018 19:58    Procedures Procedures (including critical care time)  Medications Ordered in ED Medications  sodium chloride flush (NS) 0.9 % injection 3 mL (3 mLs Intravenous Given 06/24/18 2029)  HYDROcodone-acetaminophen (NORCO/VICODIN) 5-325 MG per tablet 1 tablet (1 tablet Oral Given 06/24/18 2028)  morphine 4 MG/ML injection 4 mg (4 mg Intravenous Given 06/24/18 2222)     Initial Impression / Assessment and Plan / ED Course  I have reviewed the triage vital signs  and the nursing notes.  Pertinent labs & imaging results that were available during  my care of the patient were reviewed by me and considered in my medical decision making (see chart for details).     Patient with atypical chest pain.  Suspect bronchitis with inflammation in the chest.  Patient will be given Vicodin Z-Pak and also she has run out of her Klonopin so she will be given a 5-day course until she sees a doctor Monday Final Clinical Impressions(s) / ED Diagnoses   Final diagnoses:  Atypical chest pain    ED Discharge Orders         Ordered    HYDROcodone-acetaminophen (NORCO/VICODIN) 5-325 MG tablet  Every 6 hours PRN     06/24/18 2251    clonazePAM (KLONOPIN) 1 MG tablet  3 times daily PRN     06/24/18 2251    doxycycline (VIBRAMYCIN) 100 MG capsule  2 times daily     06/24/18 2251           Milton Ferguson, MD 06/24/18 2254

## 2018-06-24 NOTE — ED Notes (Signed)
Dr. Roderic Palau notified patient states she is still having chest pain.

## 2018-06-24 NOTE — ED Notes (Signed)
Patient still complaining of sharp chest pain at this time. Patient states that pain medication did not help.

## 2018-06-26 ENCOUNTER — Telehealth: Payer: Self-pay | Admitting: General Practice

## 2018-06-26 NOTE — Telephone Encounter (Signed)
Ms. Liebig called to inquire about coming back in to the practice.  I explained to her our policy regarding no-shows and she has been dismissed for these by Dr. [name].  We cannot retract the dismissal and it still stands in place.

## 2018-07-08 DIAGNOSIS — I10 Essential (primary) hypertension: Secondary | ICD-10-CM | POA: Insufficient documentation

## 2018-07-08 DIAGNOSIS — E785 Hyperlipidemia, unspecified: Secondary | ICD-10-CM | POA: Insufficient documentation

## 2018-07-11 DIAGNOSIS — M129 Arthropathy, unspecified: Secondary | ICD-10-CM | POA: Insufficient documentation

## 2018-07-20 DIAGNOSIS — N3289 Other specified disorders of bladder: Secondary | ICD-10-CM | POA: Insufficient documentation

## 2018-07-20 DIAGNOSIS — F4323 Adjustment disorder with mixed anxiety and depressed mood: Secondary | ICD-10-CM | POA: Insufficient documentation

## 2018-08-14 ENCOUNTER — Emergency Department (HOSPITAL_COMMUNITY)
Admission: EM | Admit: 2018-08-14 | Discharge: 2018-08-14 | Disposition: A | Discharge status: Home / Self Care | Payer: Medicare HMO | Attending: Emergency Medicine | Admitting: Emergency Medicine

## 2018-08-14 ENCOUNTER — Other Ambulatory Visit: Payer: Self-pay

## 2018-08-14 ENCOUNTER — Emergency Department (HOSPITAL_COMMUNITY): Payer: Medicare HMO

## 2018-08-14 ENCOUNTER — Encounter (HOSPITAL_COMMUNITY): Payer: Self-pay

## 2018-08-14 DIAGNOSIS — M25552 Pain in left hip: Secondary | ICD-10-CM | POA: Insufficient documentation

## 2018-08-14 DIAGNOSIS — F17210 Nicotine dependence, cigarettes, uncomplicated: Secondary | ICD-10-CM | POA: Diagnosis not present

## 2018-08-14 DIAGNOSIS — J45909 Unspecified asthma, uncomplicated: Secondary | ICD-10-CM | POA: Diagnosis not present

## 2018-08-14 DIAGNOSIS — Z79899 Other long term (current) drug therapy: Secondary | ICD-10-CM | POA: Insufficient documentation

## 2018-08-14 DIAGNOSIS — M25551 Pain in right hip: Secondary | ICD-10-CM | POA: Diagnosis not present

## 2018-08-14 DIAGNOSIS — Z7982 Long term (current) use of aspirin: Secondary | ICD-10-CM | POA: Insufficient documentation

## 2018-08-14 DIAGNOSIS — R52 Pain, unspecified: Secondary | ICD-10-CM

## 2018-08-14 HISTORY — DX: Heart failure, unspecified: I50.9

## 2018-08-14 MED ORDER — HYDROCODONE-ACETAMINOPHEN 5-325 MG PO TABS: 1.0000 | ORAL_TABLET | Freq: Four times a day (QID) | ORAL | 0 refills | Status: DC | PRN

## 2018-08-14 MED ORDER — MELOXICAM 15 MG PO TABS: 15.0000 mg | ORAL_TABLET | Freq: Every day | ORAL | Status: DC

## 2018-08-14 MED ORDER — KETOROLAC TROMETHAMINE 60 MG/2ML IM SOLN
60.0000 mg | Freq: Once | INTRAMUSCULAR | Status: AC
  Administered 2018-08-14: 60 mg via INTRAMUSCULAR
  Filled 2018-08-14: qty 2

## 2018-08-14 NOTE — Discharge Instructions (Addendum)
See your Orthopaedist for recheck.  Your xrays show mild arthritis

## 2018-08-14 NOTE — ED Triage Notes (Signed)
Pt c/o of pain in both hips and groin. Pt was supposed to have a doctor's appointment today but they were closed. Pt denies injury.

## 2018-08-14 NOTE — ED Provider Notes (Signed)
[facility] EMERGENCY DEPARTMENT Provider Note   CSN: [id number] Arrival date & time: 08/14/18  1043    History   Chief Complaint Chief Complaint  Patient presents with  . Hip Pain    HPI Darlene Wilcox is a 58 y.o. female.     The history is provided by the patient. No language interpreter was used.  Hip Pain  This is a new problem. The problem occurs constantly. The problem has not changed since onset.Nothing aggravates the symptoms. Nothing relieves the symptoms. She has tried nothing for the symptoms. The treatment provided no relief.  Pt complains of bilat hip pains.  Pt is concerned that she has arthritis.  Pt reports her Mother has arthritis.   Past Medical History:  Diagnosis Date  . Anxiety   . Asthma    daily inhaler  . Bipolar disorder ([place])   . Bronchitis due to tobacco use 07/2016  . 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 CHF (congestive heart failure) ([place])   . Chronic back pain greater than 3 months duration   . Depression   . Full dentures   . GERD (gastroesophageal reflux disease)   . Headache(784.0)    1-2 x/month  . History of ankle sprain    right; 2 weeks ago;  c/o severe pain  . Seizures ([place])    states "silent seizures"; is on anticonvulsant; none in 2-3 mos.    Patient Active Problem List   Diagnosis Date Noted  . Cocaine abuse ([place])   . Anemia   . Hypotension 01/31/2017  . AKI (acute kidney injury) ([place])   . Sepsis ([place])   . Slurred speech   . Acute hypoxemic respiratory failure ([place]) 08/12/2016  . Superficial thrombophlebitis 08/12/2016  . Acute congestive heart failure ([place]) 08/07/2016  . Acute systolic CHF (congestive heart failure) ([place])   . Chest pain 08/06/2016  . Prolonged QT interval 08/06/2016  . Volume overload 08/05/2016  . Contusion of hip 05/08/2016  . Asthma, chronic 08/07/2014  . Seizures ([place]) 02/23/2014  . Bipolar disorder ([place]) 11/13/2006  . DISORDER, TOBACCO USE 11/13/2006  . GERD 11/13/2006  .  BACK PAIN, CHRONIC 11/13/2006  . HX, PERSONAL, ALCOHOLISM 11/13/2006    Past Surgical History:  Procedure Laterality Date  . ABDOMINAL HYSTERECTOMY     partial  . APPENDECTOMY    . BUNIONECTOMY Left   . CARPAL TUNNEL RELEASE Right 01/13/2013   Procedure: CARPAL TUNNEL RELEASE;  Surgeon: Cammie Sickle., MD;  Location: [place];  Service: Orthopedics;  Laterality: Right;  . CERVICAL FUSION     x 2  . DIAGNOSTIC LAPAROSCOPY  11/08/2006  . INGUINAL HERNIA REPAIR Right   . LAPAROSCOPIC APPENDECTOMY  11/08/2006  . LEFT HEART CATH AND CORONARY ANGIOGRAPHY N/A 08/08/2016   Procedure: Left Heart Cath and Coronary Angiography;  Surgeon: Wellington Hampshire, MD;  Location: [place] CV LAB;  Service: Cardiovascular;  Laterality: N/A;  . LUMBAR FUSION  06/28/2014   L2-5  . LUMBAR LAMINECTOMY  02/05/2007   L4-5 fusion  . TUBAL LIGATION       OB History   No obstetric history on file.      Home Medications    Prior to Admission medications   Medication Sig Start Date End Date Taking? Authorizing Provider  aspirin 325 MG tablet Take 325 mg by mouth daily.    [provider]  buPROPion (ZYBAN) 150 MG 12 hr tablet Take 150 mg by mouth  2 (two) times daily.    [provider]  busPIRone (BUSPAR) 30 MG tablet Take 15 mg by mouth 3 (three) times daily.    [provider]  clonazePAM (KLONOPIN) 1 MG tablet Take 0.5 tablets (0.5 mg total) by mouth 3 (three) times daily as needed for anxiety. 06/24/18   Milton Ferguson, MD  doxycycline (VIBRAMYCIN) 100 MG capsule Take 1 capsule (100 mg total) by mouth 2 (two) times daily. One po bid x 7 days 06/24/18   Milton Ferguson, MD  FLUoxetine (PROZAC) 20 MG capsule Take 60 mg by mouth daily.    [provider]  gabapentin (NEURONTIN) 400 MG capsule Take 400 mg by mouth 3 (three) times daily.    [provider]  HYDROcodone-acetaminophen (NORCO/VICODIN) 5-325 MG tablet Take 1 tablet by mouth every 6  (six) hours as needed for moderate pain. 08/14/18   Fransico Meadow, PA-C  losartan (COZAAR) 25 MG tablet Take 25 mg by mouth daily.    [provider]  meloxicam (MOBIC) 15 MG tablet Take 1 tablet (15 mg total) by mouth daily. 08/14/18   Fransico Meadow, PA-C  mirabegron ER (MYRBETRIQ) 50 MG TB24 tablet Take 50 mg by mouth daily.    [provider]  Multiple Vitamin (MULTIVITAMIN WITH MINERALS) TABS tablet Take 1 tablet by mouth daily.    [provider]  PROAIR HFA 108 (90 Base) MCG/ACT inhaler INHALE 2 PUFFS INTO THE LUNGS EVERY 4 HOURS AS NEEDED FOR WHEEZING ORSHORTNESS OF BREATH. Patient not taking: Reported on 06/24/2018 09/19/16   Claretta Fraise, MD  topiramate (TOPAMAX) 100 MG tablet Take 100 mg by mouth 2 (two) times daily.  03/31/14   Challa, Wallace Cullens, MD  traMADol (ULTRAM) 50 MG tablet Take 50 mg by mouth daily as needed for moderate pain.    [provider]  traZODone (DESYREL) 50 MG tablet Take 50 mg by mouth at bedtime.    [provider]  ziprasidone (GEODON) 80 MG capsule Take 80 mg by mouth 2 (two) times daily.  03/29/13   Dewain Penning, MD    Family History Family History  Problem Relation Age of Onset  . Lung cancer Father   . Diabetes Maternal Grandmother   . Heart attack Maternal Grandmother   . Deep vein thrombosis Neg Hx     Social History Social History   Tobacco Use  . Smoking status: Current Every Day Smoker    Packs/day: 0.50    Years: 20.00    Pack years: 10.00    Types: Cigarettes  . Smokeless tobacco: Never Used  . Tobacco comment:  patient states smokes only 5 cigarettes per day  Substance Use Topics  . Alcohol use: No    Alcohol/week: 0.0 standard drinks  . Drug use: No     Allergies   Apple; Penicillins; and Bee venom   Review of Systems Review of Systems  All other systems reviewed and are negative.    Physical Exam Updated Vital Signs BP (!) 97/56 (BP Location: Left Arm)   Pulse 72   Temp  98.3 F (36.8 C) (Oral)   Ht 5\' 2"  (1.575 m)   Wt 75.8 kg   SpO2 98%   BMI 30.54 kg/m   Physical Exam Vitals signs reviewed.  HENT:     Head: Normocephalic.  Neck:     Musculoskeletal: Normal range of motion.  Cardiovascular:     Rate and Rhythm: Normal rate.     Pulses: Normal  pulses.  Pulmonary:     Effort: Pulmonary effort is normal.  Musculoskeletal: Normal range of motion.        General: Tenderness present. No swelling.  Skin:    General: Skin is warm.      ED Treatments / Results  Labs (all labs ordered are listed, but only abnormal results are displayed) Labs Reviewed - No data to display  EKG None  Radiology Dg Hips Bilat With Pelvis Min 5 Views  Result Date: 08/14/2018 CLINICAL DATA:  Chronic bilateral hip pain EXAM: DG HIP (WITH OR WITHOUT PELVIS) 5+V BILAT COMPARISON:  None. FINDINGS: There is no evidence of hip fracture or dislocation. There is mild superior acetabular spurring. Joint spaces are preserved. IMPRESSION: Minimal osteoarthrosis of the hips.  No acute abnormality. Electronically Signed   By: Ulyses Jarred M.D.   On: 08/14/2018 13:37    Procedures Procedures (including critical care time)  Medications Ordered in ED Medications  ketorolac (TORADOL) injection 60 mg (60 mg Intramuscular Given 08/14/18 1402)     Initial Impression / Assessment and Plan / ED Course  I have reviewed the triage vital signs and the nursing notes.  Pertinent labs & imaging results that were available during my care of the patient were reviewed by me and considered in my medical decision making (see chart for details).        MDM   Pt reports difficulty walking due to pain.  Pt given rx for hydrocodone and meloxicam   Final Clinical Impressions(s) / ED Diagnoses   Final diagnoses:  Pain of both hip joints    ED Discharge Orders         Ordered    HYDROcodone-acetaminophen (NORCO/VICODIN) 5-325 MG tablet  Every 6 hours PRN,   Status:  Discontinued      08/14/18 1405    meloxicam (MOBIC) 15 MG tablet  Daily,   Status:  Discontinued     08/14/18 1405    HYDROcodone-acetaminophen (NORCO/VICODIN) 5-325 MG tablet  Every 6 hours PRN     08/14/18 1418    meloxicam (MOBIC) 15 MG tablet  Daily     08/14/18 1418           Fransico Meadow, [place] 08/14/18 1433    Milton Ferguson, MD 08/14/18 1459

## 2018-11-13 ENCOUNTER — Emergency Department (HOSPITAL_COMMUNITY)
Admission: EM | Admit: 2018-11-13 | Discharge: 2018-11-14 | Disposition: A | Discharge status: Other Acute Inpatient Hospital | Payer: Medicare Other | Attending: Emergency Medicine | Admitting: Emergency Medicine

## 2018-11-13 ENCOUNTER — Encounter (HOSPITAL_COMMUNITY): Payer: Self-pay | Admitting: *Deleted

## 2018-11-13 ENCOUNTER — Other Ambulatory Visit: Payer: Self-pay

## 2018-11-13 DIAGNOSIS — Z20828 Contact with and (suspected) exposure to other viral communicable diseases: Secondary | ICD-10-CM | POA: Insufficient documentation

## 2018-11-13 DIAGNOSIS — Z79899 Other long term (current) drug therapy: Secondary | ICD-10-CM | POA: Insufficient documentation

## 2018-11-13 DIAGNOSIS — J45909 Unspecified asthma, uncomplicated: Secondary | ICD-10-CM | POA: Diagnosis not present

## 2018-11-13 DIAGNOSIS — R45851 Suicidal ideations: Secondary | ICD-10-CM | POA: Insufficient documentation

## 2018-11-13 DIAGNOSIS — Z7982 Long term (current) use of aspirin: Secondary | ICD-10-CM | POA: Insufficient documentation

## 2018-11-13 DIAGNOSIS — F333 Major depressive disorder, recurrent, severe with psychotic symptoms: Secondary | ICD-10-CM | POA: Diagnosis not present

## 2018-11-13 DIAGNOSIS — F17210 Nicotine dependence, cigarettes, uncomplicated: Secondary | ICD-10-CM | POA: Insufficient documentation

## 2018-11-13 DIAGNOSIS — F329 Major depressive disorder, single episode, unspecified: Secondary | ICD-10-CM | POA: Diagnosis present

## 2018-11-13 LAB — COMPREHENSIVE METABOLIC PANEL
ALT: 28 U/L (ref 0–44)
AST: 25 U/L (ref 15–41)
Albumin: 3.9 g/dL (ref 3.5–5.0)
Alkaline Phosphatase: 111 U/L (ref 38–126)
Anion gap: 9 (ref 5–15)
BUN: 11 mg/dL (ref 6–20)
CO2: 22 mmol/L (ref 22–32)
Calcium: 8.9 mg/dL (ref 8.9–10.3)
Chloride: 109 mmol/L (ref 98–111)
Creatinine, Ser: 0.92 mg/dL (ref 0.44–1.00)
GFR calc Af Amer: >60 mL/min (ref >60)
GFR calc non Af Amer: >60 mL/min (ref >60)
Glucose, Bld: 83 mg/dL (ref 70–99)
Potassium: 3.5 mmol/L (ref 3.5–5.1)
Sodium: 140 mmol/L (ref 135–145)
Total Bilirubin: 0.4 mg/dL (ref 0.3–1.2)
Total Protein: 7.1 g/dL (ref 6.5–8.1)

## 2018-11-13 LAB — CBC WITH DIFFERENTIAL/PLATELET
Abs Immature Granulocytes: 0.03 10*3/uL (ref 0.00–0.07)
Basophils Absolute: 0.1 10*3/uL (ref 0.0–0.1)
Basophils Relative: 1 %
Eosinophils Absolute: 0.2 10*3/uL (ref 0.0–0.5)
Eosinophils Relative: 2 %
HCT: 46.1 % — ABNORMAL HIGH (ref 36.0–46.0)
Hemoglobin: 14.2 g/dL (ref 12.0–15.0)
Immature Granulocytes: 0 %
Lymphocytes Relative: 29 %
Lymphs Abs: 2.3 10*3/uL (ref 0.7–4.0)
MCH: 27.8 pg (ref 26.0–34.0)
MCHC: 30.8 g/dL (ref 30.0–36.0)
MCV: 90.2 fL (ref 80.0–100.0)
Monocytes Absolute: 0.6 10*3/uL (ref 0.1–1.0)
Monocytes Relative: 8 %
Neutro Abs: 4.8 10*3/uL (ref 1.7–7.7)
Neutrophils Relative %: 60 %
Platelets: 196 10*3/uL (ref 150–400)
RBC: 5.11 MIL/uL (ref 3.87–5.11)
RDW: 13.2 % (ref 11.5–15.5)
WBC: 7.9 10*3/uL (ref 4.0–10.5)
nRBC: 0.5 % — ABNORMAL HIGH (ref 0.0–0.2)

## 2018-11-13 LAB — POC URINE PREG, ED: Preg Test, Ur: NEGATIVE

## 2018-11-13 LAB — RAPID URINE DRUG SCREEN, HOSP PERFORMED
Amphetamines: NOT DETECTED
Barbiturates: NOT DETECTED
Benzodiazepines: POSITIVE — AB
Cocaine: NOT DETECTED
Opiates: NOT DETECTED
Tetrahydrocannabinol: POSITIVE — AB

## 2018-11-13 LAB — ACETAMINOPHEN LEVEL: Acetaminophen (Tylenol), Serum: <10 ug/mL — ABNORMAL LOW (ref 10–30)

## 2018-11-13 LAB — ETHANOL: Alcohol, Ethyl (B): <10 mg/dL (ref <10)

## 2018-11-13 LAB — SARS CORONAVIRUS 2 BY RT PCR (HOSPITAL ORDER, PERFORMED IN ~~LOC~~ HOSPITAL LAB): SARS Coronavirus 2: NEGATIVE

## 2018-11-13 LAB — SALICYLATE LEVEL: Salicylate Lvl: <7 mg/dL (ref 2.8–30.0)

## 2018-11-13 MED ORDER — GABAPENTIN 400 MG PO CAPS
400.0000 mg | ORAL_CAPSULE | Freq: Three times a day (TID) | ORAL | Status: DC
  Administered 2018-11-13: 400 mg via ORAL
  Filled 2018-11-13: qty 1

## 2018-11-13 MED ORDER — CLONAZEPAM 0.5 MG PO TABS
0.5000 mg | ORAL_TABLET | Freq: Three times a day (TID) | ORAL | Status: DC | PRN
  Administered 2018-11-13: 0.5 mg via ORAL
  Filled 2018-11-13: qty 1

## 2018-11-13 MED ORDER — LOSARTAN POTASSIUM 25 MG PO TABS: 25.0000 mg | ORAL_TABLET | Freq: Every day | ORAL | Status: DC

## 2018-11-13 MED ORDER — BUSPIRONE HCL 5 MG PO TABS
15.0000 mg | ORAL_TABLET | Freq: Three times a day (TID) | ORAL | Status: DC
  Administered 2018-11-13: 15 mg via ORAL
  Filled 2018-11-13: qty 3

## 2018-11-13 MED ORDER — TRAZODONE HCL 50 MG PO TABS
50.0000 mg | ORAL_TABLET | Freq: Every day | ORAL | Status: DC
  Administered 2018-11-13: 50 mg via ORAL
  Filled 2018-11-13 (×2): qty 1

## 2018-11-13 MED ORDER — FLUOXETINE HCL 20 MG PO CAPS: 60.0000 mg | ORAL_CAPSULE | Freq: Every day | ORAL | Status: DC

## 2018-11-13 MED ORDER — TOPIRAMATE 25 MG PO TABS
100.0000 mg | ORAL_TABLET | Freq: Two times a day (BID) | ORAL | Status: DC
  Administered 2018-11-13: 100 mg via ORAL
  Filled 2018-11-13: qty 4

## 2018-11-13 MED ORDER — ASPIRIN 325 MG PO TABS: 325.0000 mg | ORAL_TABLET | Freq: Every day | ORAL | Status: DC

## 2018-11-13 MED ORDER — BUPROPION HCL ER (SR) 150 MG PO TB12
150.0000 mg | ORAL_TABLET | Freq: Two times a day (BID) | ORAL | Status: DC
  Administered 2018-11-13: 150 mg via ORAL
  Filled 2018-11-13 (×8): qty 1
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
## 2018-11-13 NOTE — ED Notes (Addendum)
Pt accepted to [facility] Bed will be ready in the morning @ 0900.

## 2018-11-13 NOTE — Progress Notes (Signed)
Pt meets inpatient criteria per Marvia Pickles, NP. Referral information has been sent to the following hospitals for review.   [facility] [place]  Disposition will continue to follow.   Audree Camel, [place], [place] Disposition  [phone]

## 2018-11-13 NOTE — ED Notes (Signed)
RN called [facility] @ [phone], and Admissions at [facility] told this RN report needs to be called 1 hour prior to Pt leaving for their facility. Pt is Voluntary at this time and this RN will retrieve Consent documentation.

## 2018-11-13 NOTE — ED Notes (Signed)
TTS Consult at bedside 

## 2018-11-13 NOTE — ED Notes (Signed)
TTS Called and states Pt meets in patient placement and bed may be available this evening. Recommend Rapid Covid Test be ordered.

## 2018-11-13 NOTE — Progress Notes (Signed)
Pt accepted to [facility], main campus Dr. Jonelle Sports is the accepting/attending provider.   Call report to [phone] [facility] @ [facility] notified.    Pt is voluntary and will be transported by [facility].  Pt is scheduled to arrive at [facility] after 8am on 11/14/18.  Audree Camel, LCSW, [place] Disposition [facility]/TTS [phone]
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
## 2018-11-27 ENCOUNTER — Encounter (HOSPITAL_COMMUNITY): Payer: Self-pay | Admitting: Emergency Medicine

## 2018-11-27 ENCOUNTER — Emergency Department (HOSPITAL_COMMUNITY): Payer: Medicare Other

## 2018-11-27 ENCOUNTER — Emergency Department (HOSPITAL_COMMUNITY)
Admission: EM | Admit: 2018-11-27 | Discharge: 2018-11-27 | Disposition: A | Discharge status: Home / Self Care | Payer: Medicare Other | Attending: Emergency Medicine | Admitting: Emergency Medicine

## 2018-11-27 DIAGNOSIS — M545 Low back pain: Secondary | ICD-10-CM | POA: Insufficient documentation

## 2018-11-27 DIAGNOSIS — S20219A Contusion of unspecified front wall of thorax, initial encounter: Secondary | ICD-10-CM | POA: Diagnosis not present

## 2018-11-27 DIAGNOSIS — Y999 Unspecified external cause status: Secondary | ICD-10-CM | POA: Insufficient documentation

## 2018-11-27 DIAGNOSIS — Y9389 Activity, other specified: Secondary | ICD-10-CM | POA: Diagnosis not present

## 2018-11-27 DIAGNOSIS — J45909 Unspecified asthma, uncomplicated: Secondary | ICD-10-CM | POA: Insufficient documentation

## 2018-11-27 DIAGNOSIS — F17210 Nicotine dependence, cigarettes, uncomplicated: Secondary | ICD-10-CM | POA: Diagnosis not present

## 2018-11-27 DIAGNOSIS — Y9289 Other specified places as the place of occurrence of the external cause: Secondary | ICD-10-CM | POA: Insufficient documentation

## 2018-11-27 DIAGNOSIS — M542 Cervicalgia: Secondary | ICD-10-CM | POA: Diagnosis not present

## 2018-11-27 DIAGNOSIS — T07XXXA Unspecified multiple injuries, initial encounter: Secondary | ICD-10-CM

## 2018-11-27 DIAGNOSIS — Z7982 Long term (current) use of aspirin: Secondary | ICD-10-CM | POA: Insufficient documentation

## 2018-11-27 DIAGNOSIS — Z79899 Other long term (current) drug therapy: Secondary | ICD-10-CM | POA: Diagnosis not present

## 2018-11-27 DIAGNOSIS — M79641 Pain in right hand: Secondary | ICD-10-CM | POA: Insufficient documentation

## 2018-11-27 DIAGNOSIS — S299XXA Unspecified injury of thorax, initial encounter: Secondary | ICD-10-CM | POA: Diagnosis present

## 2018-11-27 MED ORDER — CYCLOBENZAPRINE HCL 10 MG PO TABS
10.0000 mg | ORAL_TABLET | Freq: Once | ORAL | Status: AC
  Administered 2018-11-27: 10 mg via ORAL
  Filled 2018-11-27: qty 1

## 2018-11-27 MED ORDER — HYDROCODONE-ACETAMINOPHEN 5-325 MG PO TABS
1.0000 | ORAL_TABLET | Freq: Once | ORAL | Status: AC
  Administered 2018-11-27: 15:00:00 1 via ORAL
  Filled 2018-11-27: qty 1

## 2018-11-27 MED ORDER — CYCLOBENZAPRINE HCL 10 MG PO TABS: 10.0000 mg | ORAL_TABLET | Freq: Three times a day (TID) | ORAL | 0 refills | Status: DC

## 2018-11-27 MED ORDER — PROCHLORPERAZINE EDISYLATE 10 MG/2ML IJ SOLN: 5.0000 mg | Freq: Once | INTRAMUSCULAR | Status: DC

## 2018-11-27 MED ORDER — PROCHLORPERAZINE MALEATE 5 MG PO TABS
5.0000 mg | ORAL_TABLET | Freq: Once | ORAL | Status: AC
  Administered 2018-11-27: 15:00:00 5 mg via ORAL
  Filled 2018-11-27: qty 1

## 2018-11-27 NOTE — ED Notes (Signed)
Pt complaining of lower back pain and right arm pain. Small scratch to right hand. Ambulatory
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
## 2018-11-27 NOTE — Discharge Instructions (Addendum)
Your vital signs are stable.  The x-ray of your hand is negative for fracture or dislocation.  The x-ray of your cervical spine and lumbar spine are both negative for acute fracture or dislocation.  Your surgical hardware seems to be in place.  The x-ray of your chest is negative for acute injury to your bony structures, or your lungs.  Please use 2 extra strength Tylenol with breakfast, lunch, dinner, and at bedtime.  Please use Flexeril 3 times daily for spasm pain. This medication may cause drowsiness. Please do not drink, drive, or participate in activity that requires concentration while taking this medication.  Please see Dr. Redmond Pulling in the office for follow-up and additional management.

## 2018-11-27 NOTE — ED Notes (Signed)
Pt has been discharged. Calling her ride home

## 2018-12-01 ENCOUNTER — Emergency Department (HOSPITAL_COMMUNITY)
Admission: EM | Admit: 2018-12-01 | Discharge: 2018-12-02 | Disposition: A | Discharge status: Other Acute Inpatient Hospital | Payer: Medicare Other | Source: Home / Self Care | Attending: Emergency Medicine | Admitting: Emergency Medicine

## 2018-12-01 ENCOUNTER — Other Ambulatory Visit: Payer: Self-pay

## 2018-12-01 ENCOUNTER — Encounter (HOSPITAL_COMMUNITY): Payer: Self-pay | Admitting: *Deleted

## 2018-12-01 DIAGNOSIS — R45851 Suicidal ideations: Secondary | ICD-10-CM | POA: Insufficient documentation

## 2018-12-01 DIAGNOSIS — Z7982 Long term (current) use of aspirin: Secondary | ICD-10-CM | POA: Insufficient documentation

## 2018-12-01 DIAGNOSIS — F3181 Bipolar II disorder: Secondary | ICD-10-CM | POA: Diagnosis not present

## 2018-12-01 DIAGNOSIS — J45909 Unspecified asthma, uncomplicated: Secondary | ICD-10-CM | POA: Insufficient documentation

## 2018-12-01 DIAGNOSIS — F17210 Nicotine dependence, cigarettes, uncomplicated: Secondary | ICD-10-CM | POA: Insufficient documentation

## 2018-12-01 DIAGNOSIS — Z955 Presence of coronary angioplasty implant and graft: Secondary | ICD-10-CM | POA: Insufficient documentation

## 2018-12-01 DIAGNOSIS — G8929 Other chronic pain: Secondary | ICD-10-CM | POA: Insufficient documentation

## 2018-12-01 DIAGNOSIS — F322 Major depressive disorder, single episode, severe without psychotic features: Secondary | ICD-10-CM | POA: Insufficient documentation

## 2018-12-01 DIAGNOSIS — F1290 Cannabis use, unspecified, uncomplicated: Secondary | ICD-10-CM | POA: Insufficient documentation

## 2018-12-01 DIAGNOSIS — Z880 Allergy status to penicillin: Secondary | ICD-10-CM | POA: Insufficient documentation

## 2018-12-01 DIAGNOSIS — G4089 Other seizures: Secondary | ICD-10-CM | POA: Insufficient documentation

## 2018-12-01 DIAGNOSIS — I5021 Acute systolic (congestive) heart failure: Secondary | ICD-10-CM | POA: Insufficient documentation

## 2018-12-01 DIAGNOSIS — Z03818 Encounter for observation for suspected exposure to other biological agents ruled out: Secondary | ICD-10-CM | POA: Insufficient documentation

## 2018-12-01 DIAGNOSIS — Z79899 Other long term (current) drug therapy: Secondary | ICD-10-CM | POA: Insufficient documentation

## 2018-12-01 LAB — CBC WITH DIFFERENTIAL/PLATELET
Abs Immature Granulocytes: 0.04 10*3/uL (ref 0.00–0.07)
Basophils Absolute: 0.1 10*3/uL (ref 0.0–0.1)
Basophils Relative: 1 %
Eosinophils Absolute: 0.4 10*3/uL (ref 0.0–0.5)
Eosinophils Relative: 3 %
HCT: 43.1 % (ref 36.0–46.0)
Hemoglobin: 13.9 g/dL (ref 12.0–15.0)
Immature Granulocytes: 0 %
Lymphocytes Relative: 34 %
Lymphs Abs: 3.5 10*3/uL (ref 0.7–4.0)
MCH: 27.7 pg (ref 26.0–34.0)
MCHC: 32.3 g/dL (ref 30.0–36.0)
MCV: 85.9 fL (ref 80.0–100.0)
Monocytes Absolute: 0.8 10*3/uL (ref 0.1–1.0)
Monocytes Relative: 8 %
Neutro Abs: 5.6 10*3/uL (ref 1.7–7.7)
Neutrophils Relative %: 54 %
Platelets: 203 10*3/uL (ref 150–400)
RBC: 5.02 MIL/uL (ref 3.87–5.11)
RDW: 13.2 % (ref 11.5–15.5)
WBC: 10.4 10*3/uL (ref 4.0–10.5)
nRBC: 0 % (ref 0.0–0.2)

## 2018-12-01 LAB — RAPID URINE DRUG SCREEN, HOSP PERFORMED
Amphetamines: NOT DETECTED
Barbiturates: NOT DETECTED
Benzodiazepines: NOT DETECTED
Cocaine: NOT DETECTED
Opiates: NOT DETECTED
Tetrahydrocannabinol: POSITIVE — AB

## 2018-12-01 LAB — COMPREHENSIVE METABOLIC PANEL
ALT: 17 U/L (ref 0–44)
AST: 19 U/L (ref 15–41)
Albumin: 3.8 g/dL (ref 3.5–5.0)
Alkaline Phosphatase: 121 U/L (ref 38–126)
Anion gap: 7 (ref 5–15)
BUN: 12 mg/dL (ref 6–20)
CO2: 21 mmol/L — ABNORMAL LOW (ref 22–32)
Calcium: 8.7 mg/dL — ABNORMAL LOW (ref 8.9–10.3)
Chloride: 113 mmol/L — ABNORMAL HIGH (ref 98–111)
Creatinine, Ser: 0.61 mg/dL (ref 0.44–1.00)
GFR calc Af Amer: >60 mL/min (ref >60)
GFR calc non Af Amer: >60 mL/min (ref >60)
Glucose, Bld: 84 mg/dL (ref 70–99)
Potassium: 3.6 mmol/L (ref 3.5–5.1)
Sodium: 141 mmol/L (ref 135–145)
Total Bilirubin: 0.4 mg/dL (ref 0.3–1.2)
Total Protein: 6.8 g/dL (ref 6.5–8.1)

## 2018-12-01 LAB — ETHANOL: Alcohol, Ethyl (B): <10 mg/dL (ref <10)

## 2018-12-01 LAB — ACETAMINOPHEN LEVEL: Acetaminophen (Tylenol), Serum: <10 ug/mL — ABNORMAL LOW (ref 10–30)

## 2018-12-01 LAB — POC URINE PREG, ED: Preg Test, Ur: NEGATIVE

## 2018-12-01 LAB — SALICYLATE LEVEL: Salicylate Lvl: <7 mg/dL (ref 2.8–30.0)

## 2018-12-01 MED ORDER — NICOTINE 21 MG/24HR TD PT24
21.0000 mg | MEDICATED_PATCH | Freq: Once | TRANSDERMAL | Status: DC
  Administered 2018-12-01: 21 mg via TRANSDERMAL
  Filled 2018-12-01: qty 1
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
## 2018-12-01 NOTE — ED Notes (Signed)
Pt speaking with TTS at this time.  

## 2018-12-01 NOTE — ED Notes (Signed)
Pt ask could she keep her shoes with her.  Informed pt that all belongings must be locked away.  Pt starts crying and moaning out that she wants her shoes.  Attempted to redirect pt.  Called pt's daughter and gave pt phone, pt heard telling her daughter that the staff was being mean to her.

## 2018-12-01 NOTE — ED Notes (Signed)
Pt requested for the police to be called regarding outstanding warrants for her arrest. Verified pt was giving me permission to call the police and she agreed.
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
## 2018-12-02 ENCOUNTER — Other Ambulatory Visit: Payer: Self-pay | Admitting: Family

## 2018-12-02 ENCOUNTER — Inpatient Hospital Stay (HOSPITAL_COMMUNITY)
Admission: AD | Admit: 2018-12-02 | Discharge: 2018-12-06 | DRG: 885 | Disposition: A | Discharge status: Home / Self Care | Payer: Medicare Other | Source: Intra-hospital | Attending: Psychiatry | Admitting: Psychiatry

## 2018-12-02 ENCOUNTER — Encounter (HOSPITAL_COMMUNITY): Payer: Self-pay | Admitting: *Deleted

## 2018-12-02 ENCOUNTER — Other Ambulatory Visit: Payer: Self-pay

## 2018-12-02 DIAGNOSIS — I509 Heart failure, unspecified: Secondary | ICD-10-CM | POA: Diagnosis present

## 2018-12-02 DIAGNOSIS — Z91018 Allergy to other foods: Secondary | ICD-10-CM

## 2018-12-02 DIAGNOSIS — Z801 Family history of malignant neoplasm of trachea, bronchus and lung: Secondary | ICD-10-CM

## 2018-12-02 DIAGNOSIS — Z90710 Acquired absence of both cervix and uterus: Secondary | ICD-10-CM | POA: Diagnosis not present

## 2018-12-02 DIAGNOSIS — Z9049 Acquired absence of other specified parts of digestive tract: Secondary | ICD-10-CM

## 2018-12-02 DIAGNOSIS — Z79899 Other long term (current) drug therapy: Secondary | ICD-10-CM

## 2018-12-02 DIAGNOSIS — F17210 Nicotine dependence, cigarettes, uncomplicated: Secondary | ICD-10-CM | POA: Diagnosis present

## 2018-12-02 DIAGNOSIS — E781 Pure hyperglyceridemia: Secondary | ICD-10-CM | POA: Diagnosis present

## 2018-12-02 DIAGNOSIS — J45909 Unspecified asthma, uncomplicated: Secondary | ICD-10-CM | POA: Diagnosis present

## 2018-12-02 DIAGNOSIS — F3181 Bipolar II disorder: Principal | ICD-10-CM | POA: Diagnosis present

## 2018-12-02 DIAGNOSIS — F4310 Post-traumatic stress disorder, unspecified: Secondary | ICD-10-CM | POA: Diagnosis present

## 2018-12-02 DIAGNOSIS — Z23 Encounter for immunization: Secondary | ICD-10-CM | POA: Diagnosis present

## 2018-12-02 DIAGNOSIS — G8929 Other chronic pain: Secondary | ICD-10-CM | POA: Diagnosis present

## 2018-12-02 DIAGNOSIS — Z880 Allergy status to penicillin: Secondary | ICD-10-CM

## 2018-12-02 DIAGNOSIS — Z639 Problem related to primary support group, unspecified: Secondary | ICD-10-CM | POA: Diagnosis not present

## 2018-12-02 DIAGNOSIS — Z981 Arthrodesis status: Secondary | ICD-10-CM | POA: Diagnosis not present

## 2018-12-02 DIAGNOSIS — Z20828 Contact with and (suspected) exposure to other viral communicable diseases: Secondary | ICD-10-CM | POA: Diagnosis present

## 2018-12-02 DIAGNOSIS — Z91030 Bee allergy status: Secondary | ICD-10-CM | POA: Diagnosis not present

## 2018-12-02 DIAGNOSIS — G4700 Insomnia, unspecified: Secondary | ICD-10-CM | POA: Diagnosis present

## 2018-12-02 DIAGNOSIS — R45851 Suicidal ideations: Secondary | ICD-10-CM | POA: Diagnosis present

## 2018-12-02 DIAGNOSIS — Z833 Family history of diabetes mellitus: Secondary | ICD-10-CM

## 2018-12-02 DIAGNOSIS — K219 Gastro-esophageal reflux disease without esophagitis: Secondary | ICD-10-CM | POA: Diagnosis present

## 2018-12-02 DIAGNOSIS — Z62810 Personal history of physical and sexual abuse in childhood: Secondary | ICD-10-CM | POA: Diagnosis present

## 2018-12-02 DIAGNOSIS — F1290 Cannabis use, unspecified, uncomplicated: Secondary | ICD-10-CM | POA: Diagnosis present

## 2018-12-02 DIAGNOSIS — Z8249 Family history of ischemic heart disease and other diseases of the circulatory system: Secondary | ICD-10-CM | POA: Diagnosis not present

## 2018-12-02 DIAGNOSIS — I110 Hypertensive heart disease with heart failure: Secondary | ICD-10-CM | POA: Diagnosis present

## 2018-12-02 DIAGNOSIS — F332 Major depressive disorder, recurrent severe without psychotic features: Secondary | ICD-10-CM | POA: Diagnosis present

## 2018-12-02 LAB — SARS CORONAVIRUS 2 BY RT PCR (HOSPITAL ORDER, PERFORMED IN ~~LOC~~ HOSPITAL LAB): SARS Coronavirus 2: NEGATIVE

## 2018-12-02 MED ORDER — LOSARTAN POTASSIUM 25 MG PO TABS
25.0000 mg | ORAL_TABLET | Freq: Every day | ORAL | Status: DC
  Administered 2018-12-03 – 2018-12-06 (×4): 25 mg via ORAL
  Filled 2018-12-02 (×6): qty 1

## 2018-12-02 MED ORDER — ALUM & MAG HYDROXIDE-SIMETH 200-200-20 MG/5ML PO SUSP: 30.0000 mL | ORAL | Status: DC | PRN

## 2018-12-02 MED ORDER — HYDROXYZINE HCL 25 MG PO TABS
25.0000 mg | ORAL_TABLET | Freq: Three times a day (TID) | ORAL | Status: DC | PRN
  Administered 2018-12-02 – 2018-12-03 (×2): 25 mg via ORAL
  Filled 2018-12-02 (×2): qty 1

## 2018-12-02 MED ORDER — MOMETASONE FUROATE 200 MCG/ACT IN AERO
2.0000 | INHALATION_SPRAY | Freq: Two times a day (BID) | RESPIRATORY_TRACT | Status: DC
  Administered 2018-12-03 – 2018-12-06 (×7): 2 via RESPIRATORY_TRACT

## 2018-12-02 MED ORDER — NICOTINE 21 MG/24HR TD PT24
21.0000 mg | MEDICATED_PATCH | Freq: Every day | TRANSDERMAL | Status: DC
  Administered 2018-12-03 – 2018-12-06 (×4): 21 mg via TRANSDERMAL
  Filled 2018-12-02 (×8): qty 1

## 2018-12-02 MED ORDER — TRAZODONE HCL 50 MG PO TABS
50.0000 mg | ORAL_TABLET | Freq: Every day | ORAL | Status: DC
  Administered 2018-12-02: 50 mg via ORAL
  Filled 2018-12-02 (×3): qty 1

## 2018-12-02 MED ORDER — ACETAMINOPHEN 325 MG PO TABS
650.0000 mg | ORAL_TABLET | Freq: Four times a day (QID) | ORAL | Status: DC | PRN
  Administered 2018-12-05 – 2018-12-06 (×3): 650 mg via ORAL
  Filled 2018-12-02 (×3): qty 2

## 2018-12-02 MED ORDER — MAGNESIUM HYDROXIDE 400 MG/5ML PO SUSP: 30.0000 mL | Freq: Every day | ORAL | Status: DC | PRN

## 2018-12-02 MED ORDER — CHLORDIAZEPOXIDE HCL 25 MG PO CAPS
25.0000 mg | ORAL_CAPSULE | Freq: Once | ORAL | Status: AC
  Administered 2018-12-02: 25 mg via ORAL
  Filled 2018-12-02: qty 1

## 2018-12-02 MED ORDER — ALBUTEROL SULFATE HFA 108 (90 BASE) MCG/ACT IN AERS: 1.0000 | INHALATION_SPRAY | Freq: Four times a day (QID) | RESPIRATORY_TRACT | Status: DC | PRN

## 2018-12-02 MED ORDER — PNEUMOCOCCAL VAC POLYVALENT 25 MCG/0.5ML IJ INJ
0.5000 mL | INJECTION | INTRAMUSCULAR | Status: AC
  Administered 2018-12-03: 0.5 mL via INTRAMUSCULAR

## 2018-12-02 NOTE — ED Notes (Signed)
Report given to Legrand Como at [facility].  Patient left unit with [facility] transportation.  All belongings with patient.

## 2018-12-02 NOTE — Progress Notes (Signed)
Darlene Wilcox NOVEL CORONAVIRUS (COVID-19) DAILY CHECK-OFF SYMPTOMS - answer yes or no to each - every day NO YES  Have you had a fever in the past 24 hours?  . Fever (Temp > 37.80C / 100F) X   Have you had any of these symptoms in the past 24 hours? . New Cough .  Sore Throat  .  Shortness of Breath .  Difficulty Breathing .  Unexplained Body Aches   X   Have you had any one of these symptoms in the past 24 hours not related to allergies?   . Runny Nose .  Nasal Congestion .  Sneezing   X   If you have had runny nose, nasal congestion, sneezing in the past 24 hours, has it worsened?  X   EXPOSURES - check yes or no X   Have you traveled outside the state in the past 14 days?  X   Have you been in contact with someone with a confirmed diagnosis of COVID-19 or PUI in the past 14 days without wearing appropriate PPE?  X   Have you been living in the same home as a person with confirmed diagnosis of COVID-19 or a PUI (household contact)?    X   Have you been diagnosed with COVID-19?    X              What to do next: Answered NO to all: Answered YES to anything:   Proceed with unit schedule Follow the [facility] Inpatient Flowsheet.   

## 2018-12-02 NOTE — Progress Notes (Signed)
Patient states that today was her first day in the hospital and that she was pleased with the fact that she met some of her peers. Her goal for tomorrow is to try and get to know people better.
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
## 2018-12-02 NOTE — Progress Notes (Signed)
Pt accepted to [facility], Bed 404-1  Hampton Abbot, MD is the accepting provider.  Neita Garnet, MD is the attending provider.  Call report to [phone]  [facility] AP ED notified.   Pt is Voluntary.  Pt may be transported by [facility]  Pt scheduled  to arrive at [facility] as soon as bed is available. ADMISSIONS NURSE WILL CALL WHEN BED IS AVAILABLE

## 2018-12-02 NOTE — ED Notes (Signed)
[facility] called to let nurse know that "They are recommending Pt for placement". And she needs a new Covid test.  Nurse informed.

## 2018-12-02 NOTE — Plan of Care (Signed)
Nurse discussed anxiety, depression, and coping skills with patient.

## 2018-12-02 NOTE — Tx Team (Signed)
Initial Treatment Plan 12/02/2018 6:44 PM [name] NID:[id number]    PATIENT STRESSORS: Financial difficulties Marital or family conflict Substance abuse Traumatic event   PATIENT STRENGTHS: Ability for insight Average or above average intelligence Capable of independent living Communication skills General fund of knowledge Motivation for treatment/growth   PATIENT IDENTIFIED PROBLEMS: "suicial thoughts"  "anxiety"  "depression"                 DISCHARGE CRITERIA:  Ability to meet basic life and health needs Adequate post-discharge living arrangements Improved stabilization in mood, thinking, and/or behavior Medical problems require only outpatient monitoring Motivation to continue treatment in a less acute level of care Need for constant or close observation no longer present Reduction of life-threatening or endangering symptoms to within safe limits Safe-care adequate arrangements made Verbal commitment to aftercare and medication compliance Withdrawal symptoms are absent or subacute and managed without 24-hour nursing intervention  PRELIMINARY DISCHARGE PLAN: Attend aftercare/continuing care group Attend PHP/IOP Attend 12-step recovery group Outpatient therapy Return to previous living arrangement  PATIENT/FAMILY INVOLVEMENT: This treatment plan has been presented to and reviewed with the patient, Darlene Wilcox..  The patient and family have been given the opportunity to ask questions and make suggestions.  [place], [place] 12/02/2018, 6:44 PM

## 2018-12-03 DIAGNOSIS — F3181 Bipolar II disorder: Principal | ICD-10-CM

## 2018-12-03 LAB — LIPID PANEL
Cholesterol: 229 mg/dL — ABNORMAL HIGH (ref 0–200)
HDL: 25 mg/dL — ABNORMAL LOW (ref >40)
LDL Cholesterol: UNDETERMINED mg/dL (ref 0–99)
Total CHOL/HDL Ratio: 9.2 RATIO
Triglycerides: 466 mg/dL — ABNORMAL HIGH (ref <150)
VLDL: UNDETERMINED mg/dL (ref 0–40)

## 2018-12-03 LAB — TSH: TSH: 1.88 u[IU]/mL (ref 0.350–4.500)

## 2018-12-03 MED ORDER — TOPIRAMATE 25 MG PO TABS
50.0000 mg | ORAL_TABLET | Freq: Two times a day (BID) | ORAL | Status: DC
  Administered 2018-12-03 – 2018-12-04 (×3): 50 mg via ORAL
  Filled 2018-12-03 (×6): qty 2

## 2018-12-03 MED ORDER — DULOXETINE HCL 20 MG PO CPEP
20.0000 mg | ORAL_CAPSULE | Freq: Every day | ORAL | Status: DC
  Administered 2018-12-04 – 2018-12-05 (×2): 20 mg via ORAL
  Filled 2018-12-03 (×3): qty 1

## 2018-12-03 MED ORDER — TRAZODONE HCL 50 MG PO TABS
50.0000 mg | ORAL_TABLET | Freq: Every evening | ORAL | Status: DC | PRN
  Administered 2018-12-03 – 2018-12-05 (×3): 50 mg via ORAL
  Filled 2018-12-03 (×4): qty 1

## 2018-12-03 MED ORDER — BUSPIRONE HCL 15 MG PO TABS
15.0000 mg | ORAL_TABLET | Freq: Three times a day (TID) | ORAL | Status: DC
  Administered 2018-12-03 – 2018-12-04 (×5): 15 mg via ORAL
  Filled 2018-12-03 (×10): qty 1

## 2018-12-03 MED ORDER — CLONAZEPAM 1 MG PO TABS
1.0000 mg | ORAL_TABLET | Freq: Two times a day (BID) | ORAL | Status: DC
  Administered 2018-12-03 – 2018-12-04 (×2): 1 mg via ORAL
  Filled 2018-12-03 (×2): qty 1

## 2018-12-03 MED ORDER — ASPIRIN 81 MG PO CHEW
81.0000 mg | CHEWABLE_TABLET | Freq: Every day | ORAL | Status: DC
  Administered 2018-12-04 – 2018-12-06 (×3): 81 mg via ORAL
  Filled 2018-12-03 (×5): qty 1

## 2018-12-03 MED ORDER — LAMOTRIGINE 25 MG PO TABS
25.0000 mg | ORAL_TABLET | Freq: Every day | ORAL | Status: DC
  Administered 2018-12-03 – 2018-12-06 (×4): 25 mg via ORAL
  Filled 2018-12-03 (×7): qty 1

## 2018-12-03 MED ORDER — TRAZODONE HCL 50 MG PO TABS
50.0000 mg | ORAL_TABLET | Freq: Once | ORAL | Status: AC
  Administered 2018-12-03: 23:00:00 50 mg via ORAL
  Filled 2018-12-03 (×2): qty 1

## 2018-12-03 MED ORDER — BUSPIRONE HCL 15 MG PO TABS
30.0000 mg | ORAL_TABLET | Freq: Two times a day (BID) | ORAL | Status: DC
  Filled 2018-12-03 (×2): qty 2

## 2018-12-03 MED ORDER — PANTOPRAZOLE SODIUM 40 MG PO TBEC
40.0000 mg | DELAYED_RELEASE_TABLET | Freq: Every day | ORAL | Status: DC
  Administered 2018-12-03 – 2018-12-06 (×4): 40 mg via ORAL
  Filled 2018-12-03 (×7): qty 1
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
## 2018-12-03 NOTE — Progress Notes (Signed)
Patient ID: Darlene Wilcox, female   DOB: 10/15/1960, 58 y.o.   MRN: [id number] D: Patient appears sad, preoccupied and anxious on approach. Pt c/o about her medication regime is not as at home. Pt. reports sleep was fair and appetite was good.  Denies SI/HI/AV and pain. Offered no additional question or concerns  A: Safety has been maintained with Q15 minutes observation. Supported and encouragement provided to attend groups.  R: Patient remains safe. Pt is complaint with medication and group programming. Safety has been maintained Q15 mins.

## 2018-12-03 NOTE — Plan of Care (Signed)
Progress note  D: pt found in bed; compliant with medication administration. Pt denies any physical symptoms. Pt denies pain. Pt does endorse anxiety around her living situation and her family dynamic. Pt wants help finding housing if possible. Pt denies si/hi/ah/vh and verbally agrees to approach staff if these become apparent or before harming himself/others while at [facility]: Pt provided support and encouragement. Pt given medication per protocol and standing orders. Q37m safety checks implemented and continued.  R: Pt safe on the unit. Will continue to monitor.  Pt progressing in the following metrics  Problem: Education: Goal: Knowledge of [facility] Education information/materials will improve Outcome: Progressing Goal: Emotional status will improve Outcome: Progressing Goal: Mental status will improve Outcome: Progressing Goal: Verbalization of understanding the information provided will improve Outcome: Progressing

## 2018-12-03 NOTE — Progress Notes (Signed)
Adult Psychoeducational Group Note  Date:  12/03/2018 Time:  9:19 PM  Group Topic/Focus:  Wrap-Up Group:   The focus of this group is to help patients review their daily goal of treatment and discuss progress on daily workbooks.  Participation Level:  Active  Participation Quality:  Appropriate  Affect:  Appropriate  Cognitive:  Alert  Insight: Appropriate  Engagement in Group:  Engaged  Modes of Intervention:  Discussion  Additional Comments:  Patient's goal for today was to talk more today and express her feelings. Patient states she hopes to discharge this week.   Verdia Bolt L Brookie Wayment 12/03/2018, 9:19 PM
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
## 2018-12-03 NOTE — Tx Team (Signed)
Interdisciplinary Treatment and Diagnostic Plan Update  12/03/2018 Time of Session: 9:00am LELAR FAREWELL MRN: [id number]  Principal Diagnosis: <principal problem not specified>  Secondary Diagnoses: Active Problems:   MDD (major depressive disorder), recurrent severe, without psychosis ([place])   Current Medications:  Current Facility-Administered Medications  Medication Dose Route Frequency Provider Last Rate Last Dose  . acetaminophen (TYLENOL) tablet 650 mg  650 mg Oral Q6H PRN Suella Broad, FNP      . albuterol (VENTOLIN HFA) 108 (90 Base) MCG/ACT inhaler 1-2 puff  1-2 puff Inhalation Q6H PRN Lindon Romp A, NP      . alum & mag hydroxide-simeth (MAALOX/MYLANTA) 200-200-20 MG/5ML suspension 30 mL  30 mL Oral Q4H PRN Burt Ek, Gayland Curry, FNP      . hydrOXYzine (ATARAX/VISTARIL) tablet 25 mg  25 mg Oral TID PRN Lindon Romp A, NP   25 mg at 12/03/18 0741  . losartan (COZAAR) tablet 25 mg  25 mg Oral Daily Lindon Romp A, NP   25 mg at 12/03/18 1052  . magnesium hydroxide (MILK OF MAGNESIA) suspension 30 mL  30 mL Oral Daily PRN Suella Broad, FNP      . Mometasone Furoate AERO 2 puff  2 puff Inhalation BID Lindon Romp A, NP      . nicotine (NICODERM CQ - dosed in mg/24 hours) patch 21 mg  21 mg Transdermal Daily Cobos, Myer Peer, MD   21 mg at 12/03/18 0741  . pneumococcal 23 valent vaccine (PNU-IMMUNE) injection 0.5 mL  0.5 mL Intramuscular Tomorrow-1000 Cobos, Fernando A, MD      . traZODone (DESYREL) tablet 50 mg  50 mg Oral QHS Lindon Romp A, NP   50 mg at 12/02/18 2104   PTA Medications: Medications Prior to Admission  Medication Sig Dispense Refill Last Dose  . ASMANEX HFA 200 MCG/ACT AERO Inhale 2 puffs into the lungs 2 (two) times daily.     Marland Kitchen aspirin 325 MG tablet Take 325 mg by mouth daily.     Marland Kitchen buPROPion (ZYBAN) 150 MG 12 hr tablet Take 150 mg by mouth 2 (two) times daily.     . busPIRone (BUSPAR) 30 MG tablet Take 30 mg by mouth 3 (three) times  daily.     . clonazePAM (KLONOPIN) 1 MG tablet Take 1 mg by mouth 3 (three) times daily.     . cyclobenzaprine (FLEXERIL) 10 MG tablet Take 1 tablet (10 mg total) by mouth 3 (three) times daily. (Patient taking differently: Take 10 mg by mouth 3 (three) times daily as needed. ) 20 tablet 0   . esomeprazole (NEXIUM) 40 MG capsule Take 40 mg by mouth daily at 12 noon.     Marland Kitchen FLUoxetine (PROZAC) 20 MG capsule Take 60 mg by mouth daily.     Marland Kitchen gabapentin (NEURONTIN) 400 MG capsule Take 400 mg by mouth 3 (three) times daily.     Marland Kitchen losartan (COZAAR) 25 MG tablet Take 25 mg by mouth daily.     . mirabegron ER (MYRBETRIQ) 25 MG TB24 tablet Take 25 mg by mouth daily.      . Multiple Vitamin (MULTIVITAMIN WITH MINERALS) TABS tablet Take 1 tablet by mouth daily.     Marland Kitchen PROAIR HFA 108 (90 Base) MCG/ACT inhaler INHALE 2 PUFFS INTO THE LUNGS EVERY 4 HOURS AS NEEDED FOR WHEEZING ORSHORTNESS OF BREATH. (Patient taking differently: Inhale 1-2 puffs into the lungs every 6 (six) hours as needed for wheezing or shortness of breath. ) 8.5  g 0   . topiramate (TOPAMAX) 100 MG tablet Take 100 mg by mouth 2 (two) times daily.   2   . traZODone (DESYREL) 50 MG tablet Take 50 mg by mouth at bedtime.       Patient Stressors: Financial difficulties Marital or family conflict Substance abuse Traumatic event  Patient Strengths: Ability for insight Average or above average intelligence Capable of independent living Curator fund of knowledge Motivation for treatment/growth  Treatment Modalities: Medication Management, Group therapy, Case management,  1 to 1 session with clinician, Psychoeducation, Recreational therapy.   Physician Treatment Plan for Primary Diagnosis: <principal problem not specified> Long Term Goal(s):     Short Term Goals:    Medication Management: Evaluate patient's response, side effects, and tolerance of medication regimen.  Therapeutic Interventions: 1 to 1 sessions, Unit  Group sessions and Medication administration.  Evaluation of Outcomes: Progressing  Physician Treatment Plan for Secondary Diagnosis: Active Problems:   MDD (major depressive disorder), recurrent severe, without psychosis ([place])  Long Term Goal(s):     Short Term Goals:       Medication Management: Evaluate patient's response, side effects, and tolerance of medication regimen.  Therapeutic Interventions: 1 to 1 sessions, Unit Group sessions and Medication administration.  Evaluation of Outcomes: Progressing   RN Treatment Plan for Primary Diagnosis: <principal problem not specified> Long Term Goal(s): Knowledge of disease and therapeutic regimen to maintain health will improve  Short Term Goals: Ability to demonstrate self-control, Ability to identify and develop effective coping behaviors will improve and Compliance with prescribed medications will improve  Medication Management: RN will administer medications as ordered by provider, will assess and evaluate patient's response and provide education to patient for prescribed medication. RN will report any adverse and/or side effects to prescribing provider.  Therapeutic Interventions: 1 on 1 counseling sessions, Psychoeducation, Medication administration, Evaluate responses to treatment, Monitor vital signs and CBGs as ordered, Perform/monitor CIWA, COWS, AIMS and Fall Risk screenings as ordered, Perform wound care treatments as ordered.  Evaluation of Outcomes: Progressing   LCSW Treatment Plan for Primary Diagnosis: <principal problem not specified> Long Term Goal(s): Safe transition to appropriate next level of care at discharge, Engage patient in therapeutic group addressing interpersonal concerns.  Short Term Goals: Engage patient in aftercare planning with referrals and resources, Increase social support, Increase ability to appropriately verbalize feelings, Increase emotional regulation, Identify triggers associated with mental  health/substance abuse issues and Increase skills for wellness and recovery  Therapeutic Interventions: Assess for all discharge needs, 1 to 1 time with Social worker, Explore available resources and support systems, Assess for adequacy in community support network, Educate family and significant other(s) on suicide prevention, Complete Psychosocial Assessment, Interpersonal group therapy.  Evaluation of Outcomes: Progressing   Progress in Treatment: Attending groups: Yes. Participating in groups: Yes. Taking medication as prescribed: Yes. Toleration medication: Yes. Family/Significant other contact made: No, will contact:  supports if consents are granted. Patient understands diagnosis: Yes. Discussing patient identified problems/goals with staff: Yes. Medical problems stabilized or resolved: Yes. Denies suicidal/homicidal ideation: Yes. Issues/concerns per patient self-inventory: Yes.  New problem(s) identified: Yes, Describe:  legal issues, reports she has warrants  New Short Term/Long Term Goal(s):  Patient Goals:    Discharge Plan or Barriers: CSW continuing to assess for appropriate referrals. Patient is established with [facility].  Reason for Continuation of Hospitalization: Anxiety Depression  Estimated Length of Stay: 3-5 days  Attendees: Patient: 12/03/2018 11:25 AM  Physician:  12/03/2018 11:25 AM  Nursing:  12/03/2018 11:25 AM  RN Care Manager: 12/03/2018 11:25 AM  Social Worker: Stephanie Acre, [place] 12/03/2018 11:25 AM  Recreational Therapist:  12/03/2018 11:25 AM  Other:  12/03/2018 11:25 AM  Other:  12/03/2018 11:25 AM  Other: 12/03/2018 11:25 AM    Scribe for Treatment Team: Joellen Jersey, [place] 12/03/2018 11:25 AM

## 2018-12-03 NOTE — Progress Notes (Signed)
Adult Psychoeducational Group Note  Date:  12/03/2018 Time:  6:12 PM  Group Topic/Focus:  Goals Group:   The focus of this group is to help patients establish daily goals to achieve during treatment and discuss how the patient can incorporate goal setting into their daily lives to aide in recovery.  Participation Level:  Active  Participation Quality:  Appropriate  Affect:  Appropriate  Cognitive:  Alert  Insight: Appropriate  Engagement in Group:  Engaged  Modes of Intervention:  Discussion  Additional Comments:  Pt attended group and participated in discussion.  Darlene Wilcox R Darlene Wilcox 12/03/2018, 6:12 PM

## 2018-12-03 NOTE — H&P (Addendum)
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 Smokes 1/2 PPD . Home medications - ASA, Wellbutrin 150 mgrs BID ( states was taking irregularly) , Buspar 30 mgrs TID, Klonopin 1 mgr TID, Prozac 60 mgrs QDAY , Gabapentin 400 mgrs TID, Topamax 100 mgrs BID, Losartan 25 mgrs QDAY, Trazodone 100 mgrs QHS , Myrbetriq 25 mgrs QDAY.  Dx- Bipolar Disorder, Depressed. PTSD by history.  Plan- Inpatient admission. We discussed medication options- She states she does not feel Prozac or Wellbutrin ( which she was not taking regularly) were working well . We discussed options and states she is interested in Cymbalta for management of mood , anxiety, pain.  Start Cymbalta 20 mgrs QDAY  Continue Neurontin at 300 mgrs TID for now. Klonopin management confirmed via [place] Controlled Substance Registry Review . Of note, admission UDS negative for BZDs. Patient reports she has been taking regularly.  Currently states she does not want to detox off BZDs  Continue Klonopin at 1 mgr BID.  Continue ASA 81 mgr QDAY She reports she has taken Topamax for years , denies side effects. States it has helped mood partially and may be helping to decrease nightmares . Continue Topamax at 50 mgrs BID Continue Buspar 15 mgrs TID Continue Trazodone 50 mgrs QHS PRN for insomnia Start Lamotrigine 25 mgrs QDAY for Mood Disorder/ Depression.  Side effects reviewed- including risk of serotonin syndrome, severe rash on Lamotrigine.

## 2018-12-04 LAB — LDL CHOLESTEROL, DIRECT: Direct LDL: 115.5 mg/dL — ABNORMAL HIGH (ref 0–99)

## 2018-12-04 LAB — HEMOGLOBIN A1C
Hgb A1c MFr Bld: 5.2 % (ref 4.8–5.6)
Mean Plasma Glucose: 103 mg/dL

## 2018-12-04 MED ORDER — CLONAZEPAM 1 MG PO TABS
1.0000 mg | ORAL_TABLET | Freq: Two times a day (BID) | ORAL | Status: DC
  Administered 2018-12-04 – 2018-12-06 (×4): 1 mg via ORAL
  Filled 2018-12-04 (×5): qty 1

## 2018-12-04 MED ORDER — OMEGA-3-ACID ETHYL ESTERS 1 G PO CAPS
1.0000 g | ORAL_CAPSULE | Freq: Two times a day (BID) | ORAL | Status: DC
  Administered 2018-12-05 – 2018-12-06 (×3): 1 g via ORAL
  Filled 2018-12-04 (×7): qty 1

## 2018-12-04 MED ORDER — BUSPIRONE HCL 10 MG PO TABS
10.0000 mg | ORAL_TABLET | Freq: Two times a day (BID) | ORAL | Status: DC
  Filled 2018-12-04 (×3): qty 1

## 2018-12-04 NOTE — Plan of Care (Signed)
Progress note  D: pt found in the dayroom; compliant with medication administration. Pt denies any physical problems or pain. Pt is requesting her anxiety medication be given at bedtime. Pt states she slept ok. Pt was animated and pleasant. Pt denies si/hi/ah/vh and verbally agrees to approach staff if these become apparent or before harming himself/others while at [place]: Pt provided support and encouragement. Pt given medication per protocol and standing orders. Q77m safety checks implemented and continued.  R: Pt safe on the unit. Will continue to monitor.  Pt progressing in the following metrics  Problem: Education: Goal: Ability to state activities that reduce stress will improve Outcome: Progressing   Problem: Coping: Goal: Ability to identify and develop effective coping behavior will improve Outcome: Progressing   Problem: Education: Goal: Utilization of techniques to improve thought processes will improve Outcome: Progressing Goal: Knowledge of the prescribed therapeutic regimen will improve Outcome: Progressing

## 2018-12-04 NOTE — Progress Notes (Signed)
D: Pt was in dayroom upon initial approach.  Pt presents with anxious affect and mood.  She reports her day was "pretty good."  Her goal is to "get a good nights sleep, not to have nightmares, for my brain to slow down and rest."  Pt denies SI/HI, denies hallucinations.  Pt has been visible in milieu interacting with peers and staff appropriately.    A: Introduced self to pt.  Met with pt 1:1.  Actively listened to pt and offered support and encouragement.  Medication administered per order.  PRN medication administered for sleep.  15 minute safety checks in place.  R: Pt is safe on the unit.  Pt is compliant with medications.  Pt verbally contracts for safety.  Will continue to monitor and assess.

## 2018-12-04 NOTE — Progress Notes (Signed)
[facility] MD Progress Note  12/04/2018 3:39 PM Darlene Wilcox  MRN:  [id number] Subjective:  Patient reports some improvement. Remains anxious and tends to focus on medications. Denies suicidal ideations. Objective : I have discussed case with treatment team and have met with patient. 58 year old female, presented for worsening depression and anxiety, endorsing neuro-vegetative symptoms and passive SI. Reports she has been facing significant stressors. Her mother/sister are  also her landlord , and patient reports she feels her mother is becoming demented and falsely accusing her of stealing, unreasonably increasing her rent. She also states her sister has been hostile towards her. Reports history of mood instability, has been diagnosed with Bipolar Disorder and PTSD in the past .  Currently patient presents with some improvement compared to admission.  Mood is partially improved although remains anxious. Today presents with a more reactive affect and smiles, laughs briefly during session. She worries about medication issues, particularly regarding Clonazepam , which she states she was taking at 1 mgr BID on most days prior to admission. States " if I don't take it at night I just don't sleep at all". Currently she is not presenting with symptoms of WDL and her vitals are stable- no tremors , no diaphoresis or psychomotor agitation. As she improves she is more future oriented and today expresses she is hoping to discharge soon. No disruptive or agitated behaviors on unit. Labs reviewed - TSH 1.88, HgbA1C 5.2, Lipid Panel remarkable for hypertriglyceridemia and low HDL.  Principal Problem: Bipolar Disorder , Depressed  Diagnosis: Active Problems:   Bipolar II disorder (HCC)   MDD (major depressive disorder), recurrent severe, without psychosis ([place])  Total Time spent with patient: 20 minutes  Past Psychiatric History:   Past Medical History:  Past Medical History:  Diagnosis Date  . Anxiety   .  Asthma    daily inhaler  . Bipolar disorder ([place])   . Bronchitis due to tobacco use 07/2016  . 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 CHF (congestive heart failure) ([place])   . Chronic back pain greater than 3 months duration   . Depression   . Full dentures   . GERD (gastroesophageal reflux disease)   . Headache(784.0)    1-2 x/month  . History of ankle sprain    right; 2 weeks ago;  c/o severe pain  . Seizures ([place])    states "silent seizures"; is on anticonvulsant; none in 2-3 mos.    Past Surgical History:  Procedure Laterality Date  . ABDOMINAL HYSTERECTOMY     partial  . APPENDECTOMY    . BUNIONECTOMY Left   . CARPAL TUNNEL RELEASE Right 01/13/2013   Procedure: CARPAL TUNNEL RELEASE;  Surgeon: Cammie Sickle., MD;  Location: [place];  Service: Orthopedics;  Laterality: Right;  . CERVICAL FUSION     x 2  . DIAGNOSTIC LAPAROSCOPY  11/08/2006  . INGUINAL HERNIA REPAIR Right   . LAPAROSCOPIC APPENDECTOMY  11/08/2006  . LEFT HEART CATH AND CORONARY ANGIOGRAPHY N/A 08/08/2016   Procedure: Left Heart Cath and Coronary Angiography;  Surgeon: Wellington Hampshire, MD;  Location: [place] [facility];  Service: Cardiovascular;  Laterality: N/A;  . LUMBAR FUSION  06/28/2014   L2-5  . LUMBAR LAMINECTOMY  02/05/2007   L4-5 fusion  . TUBAL LIGATION     Family History:  Family History  Problem Relation Age of Onset  . Lung cancer Father   . Diabetes Maternal Grandmother   .  Heart attack Maternal Grandmother   . Deep vein thrombosis Neg Hx    Family Psychiatric  History:  Social History:  Social History   Substance and Sexual Activity  Alcohol Use No  . Alcohol/week: 0.0 standard drinks     Social History   Substance and Sexual Activity  Drug Use Yes  . Types: Marijuana   Comment: 2 weeks ago    Social History   Socioeconomic History  . Marital status: Divorced    Spouse name: Not on file  . Number of children: Not on file  . Years of  education: Not on file  . Highest education level: Not on file  Occupational History  . Occupation: Unemployed  Social Needs  . Financial resource strain: Not on file  . Food insecurity    Worry: Not on file    Inability: Not on file  . Transportation needs    Medical: Not on file    Non-medical: Not on file  Tobacco Use  . Smoking status: Current Every Day Smoker    Packs/day: 1.00    Years: 20.00    Pack years: 20.00    Types: Cigarettes  . Smokeless tobacco: Never Used  . Tobacco comment:  patient states smokes only 5 cigarettes per day  Substance and Sexual Activity  . Alcohol use: No    Alcohol/week: 0.0 standard drinks  . Drug use: Yes    Types: Marijuana    Comment: 2 weeks ago  . Sexual activity: Not Currently    Birth control/protection: Surgical  Lifestyle  . Physical activity    Days per week: Not on file    Minutes per session: Not on file  . Stress: Not on file  Relationships  . Social Herbalist on phone: Not on file    Gets together: Not on file    Attends religious service: Not on file    Active member of club or organization: Not on file    Attends meetings of clubs or organizations: Not on file    Relationship status: Not on file  Other Topics Concern  . Not on file  Social History Narrative   Lives in [place], [place] with her father.    Additional Social History:    Pain Medications: see MAR Prescriptions: see MAR Over the Counter: see MAR History of alcohol / drug use?: Yes Longest period of sobriety (when/how long): unsure Negative Consequences of Use: Financial, Legal, Personal relationships Withdrawal Symptoms: Other (Comment)(anxiety;, depression) Name of Substance 1: THC 1 - Age of First Use: 58 yrs old 1 - Amount (size/oz): joint or two 1 - Frequency: couple times week 1 - Duration: since age of 58 yrs old 1 - Last Use / Amount: 2 weeks ago  Sleep: fair, improving   Appetite:  Good  Current Medications: Current  Facility-Administered Medications  Medication Dose Route Frequency Provider Last Rate Last Dose  . acetaminophen (TYLENOL) tablet 650 mg  650 mg Oral Q6H PRN Suella Broad, FNP      . albuterol (VENTOLIN HFA) 108 (90 Base) MCG/ACT inhaler 1-2 puff  1-2 puff Inhalation Q6H PRN Lindon Romp A, NP      . alum & mag hydroxide-simeth (MAALOX/MYLANTA) 200-200-20 MG/5ML suspension 30 mL  30 mL Oral Q4H PRN Burt Ek, Gayland Curry, FNP      . aspirin chewable tablet 81 mg  81 mg Oral Daily Braian Tijerina, Myer Peer, MD   81 mg at 12/04/18 0749  . busPIRone (BUSPAR)  tablet 15 mg  15 mg Oral TID Arlo Buffone, Myer Peer, MD   15 mg at 12/04/18 1206  . clonazePAM (KLONOPIN) tablet 1 mg  1 mg Oral BID Kye Silverstein A, MD      . DULoxetine (CYMBALTA) DR capsule 20 mg  20 mg Oral Daily Makaveli Hoard, Myer Peer, MD   20 mg at 12/04/18 0749  . lamoTRIgine (LAMICTAL) tablet 25 mg  25 mg Oral Daily Hopelynn Gartland, Myer Peer, MD   25 mg at 12/04/18 0749  . losartan (COZAAR) tablet 25 mg  25 mg Oral Daily Lindon Romp A, NP   25 mg at 12/04/18 0749  . magnesium hydroxide (MILK OF MAGNESIA) suspension 30 mL  30 mL Oral Daily PRN Suella Broad, FNP      . Mometasone Furoate AERO 2 puff  2 puff Inhalation BID Lindon Romp A, NP   2 puff at 12/04/18 0748  . nicotine (NICODERM CQ - dosed in mg/24 hours) patch 21 mg  21 mg Transdermal Daily Mita Vallo, Myer Peer, MD   21 mg at 12/04/18 0748  . pantoprazole (PROTONIX) EC tablet 40 mg  40 mg Oral Daily Irby Fails, Myer Peer, MD   40 mg at 12/04/18 0749  . topiramate (TOPAMAX) tablet 50 mg  50 mg Oral BID Khai Torbert, Myer Peer, MD   50 mg at 12/04/18 0749  . traZODone (DESYREL) tablet 50 mg  50 mg Oral QHS PRN Evadna Donaghy, Myer Peer, MD   50 mg at 12/03/18 2107    Lab Results:  Results for orders placed or performed during the hospital encounter of 12/02/18 (from the past 48 hour(s))  Hemoglobin A1c     Status: None   Collection Time: 12/03/18  6:09 PM  Result Value Ref Range   Hgb A1c MFr Bld 5.2  4.8 - 5.6 %    Comment: (NOTE)         Prediabetes: 5.7 - 6.4         Diabetes: >6.4         Glycemic control for adults with diabetes: <7.0    Mean Plasma Glucose 103 mg/dL    Comment: (NOTE) Performed At: [facility] [place], [place] [id number] Rush Farmer MD [id number]   Lipid panel     Status: Abnormal   Collection Time: 12/03/18  6:09 PM  Result Value Ref Range   Cholesterol 229 (H) 0 - 200 mg/dL   Triglycerides 466 (H) <150 mg/dL   HDL 25 (L) >40 mg/dL   Total CHOL/HDL Ratio 9.2 RATIO   VLDL UNABLE TO CALCULATE IF TRIGLYCERIDE OVER 400 mg/dL 0 - 40 mg/dL   LDL Cholesterol UNABLE TO CALCULATE IF TRIGLYCERIDE OVER 400 mg/dL 0 - 99 mg/dL    Comment:        Total Cholesterol/HDL:CHD Risk Coronary Heart Disease Risk Table                     Men   Women  1/2 Average Risk   3.4   3.3  Average Risk       5.0   4.4  2 X Average Risk   9.6   7.1  3 X Average Risk  23.4   11.0        Use the calculated Patient Ratio above and the CHD Risk Table to determine the patient's CHD Risk.        ATP III CLASSIFICATION (LDL):  <100     mg/dL   Optimal  100-129  mg/dL   Near or Above                    Optimal  130-159  mg/dL   Borderline  160-189  mg/dL   High  >190     mg/dL   Very High Performed at [facility] [address], [place], [place] [address]   TSH     Status: None   Collection Time: 12/03/18  6:09 PM  Result Value Ref Range   TSH 1.880 0.350 - 4.500 uIU/mL    Comment: Performed by a 3rd Generation assay with a functional sensitivity of <=0.01 uIU/mL. Performed at [facility], [place] [address] [place], [place] [address]   LDL cholesterol, direct     Status: Abnormal   Collection Time: 12/03/18  6:09 PM  Result Value Ref Range   Direct LDL 115.5 (H) 0 - 99 mg/dL    Comment: Performed at [facility] [address], [place], [place] [address]    Blood Alcohol level:  Lab Results   Component Value Date   [facility] <10 12/01/2018   ETH <10 80/99/8338    Metabolic Disorder Labs: Lab Results  Component Value Date   HGBA1C 5.2 12/03/2018   MPG 103 12/03/2018   MPG 108 08/06/2016   No results found for: PROLACTIN Lab Results  Component Value Date   CHOL 229 (H) 12/03/2018   TRIG 466 (H) 12/03/2018   HDL 25 (L) 12/03/2018   CHOLHDL 9.2 12/03/2018   VLDL UNABLE TO CALCULATE IF TRIGLYCERIDE OVER 400 mg/dL 12/03/2018   LDLCALC UNABLE TO CALCULATE IF TRIGLYCERIDE OVER 400 mg/dL 12/03/2018   LDLCALC 80 08/06/2016    Physical Findings: AIMS: Facial and Oral Movements Muscles of Facial Expression: None, normal Lips and Perioral Area: None, normal Jaw: None, normal Tongue: None, normal,Extremity Movements Upper (arms, wrists, hands, fingers): None, normal Lower (legs, knees, ankles, toes): None, normal, Trunk Movements Neck, shoulders, hips: None, normal, Overall Severity Severity of abnormal movements (highest score from questions above): None, normal Incapacitation due to abnormal movements: None, normal Patient's awareness of abnormal movements (rate only patient's report): No Awareness, Dental Status Current problems with teeth and/or dentures?: No Does patient usually wear dentures?: No  CIWA:  CIWA-Ar Total: 2 COWS:  COWS Total Score: 3  Musculoskeletal: Strength & Muscle Tone: within normal limits Gait & Station: normal Patient leans: N/A  Psychiatric Specialty Exam: Physical Exam  ROS denies headache, no chest pain, no shortness of breath, no cough, no fever or chills   Blood pressure 117/66, pulse 100, temperature 98 F (36.7 C), temperature source Oral, resp. rate 18, height 5' 2"  (1.575 m), weight 73 kg, SpO2 99 %.Body mass index is 29.45 kg/m.  General Appearance: improving grooming   Eye Contact:  Good  Speech:  Normal Rate  Volume:  Normal  Mood:  reports she is feeling better  Affect:  more reactive, vaguely anxious   Thought Process:   Linear and Descriptions of Associations: Intact  Orientation:  Full (Time, Place, and Person)  Thought Content:  no hallucinations, no delusions  Suicidal Thoughts:  No denies suicidal or self injurious ideations, contracts for safety on unit   Homicidal Thoughts:  No  Memory:  recent and remote grossly intact   Judgement:  Fair/ improving   Insight:  Fair  Psychomotor Activity:  Normal  Concentration:  Concentration: Good and Attention Span: Good  Recall:  Good  Fund of Knowledge:  Good  Language:  Good  Akathisia:  Negative  Handed:  Right  AIMS (if indicated):     Assets:  Desire for Improvement Resilience  ADL's:  Intact  Cognition:  WNL  Sleep:  Number of Hours: 5.25   Assessment -  58 year old female, presented for worsening depression and anxiety, endorsing neuro-vegetative symptoms and passive SI. Reports she has been facing significant stressors. Her mother/sister are  also her landlord , and patient reports she feels her mother is becoming demented and falsely accusing her of stealing, unreasonably increasing her rent. She also states her sister has been hostile towards her. Reports history of mood instability, has been diagnosed with Bipolar Disorder and PTSD in the past .  Patient reports some improvement compared to admission. Today denies suicidal ideations and less ruminative about stressors. Behavior on unit in good control. At this time focuses/ worries about sleep and medication scheduling. Denies medication side effects, tolerating Cymbalta, Lamictal well thus far. Agrees to continue efforts to simplify medication regimen- current plan is to continue Cymbalta, Lamictal trial, consider gradually tapering off Buspar and Topamax, which she states have been less effective.  Treatment Plan Summary: Daily contact with patient to assess and evaluate symptoms and progress in treatment, Medication management, Plan inpatient treatment and medications as below Encourage group and  milieu participation Decrease  Buspar to 10 mgrs BID for anxiety Increase Cymbalta to 30 mgrs QDAY for depression, anxiety Continue Lamotrigine 25 mgrs QDAY for mood disorder, depression Continue  Topamax 50 mgrs BID  Start Lovaza ( Omega 3 ) for hypertriglyceridemia Continue Trazodone 50 mgrs QHS PRN for insomnia  Continue Losartan for HTN Treatment team working on disposition planning options  Jenne Campus, MD 12/04/2018, 3:39 PM

## 2018-12-05 MED ORDER — LURASIDONE HCL 20 MG PO TABS
20.0000 mg | ORAL_TABLET | Freq: Every day | ORAL | Status: DC
  Administered 2018-12-05 – 2018-12-06 (×2): 20 mg via ORAL
  Filled 2018-12-05 (×5): qty 1

## 2018-12-05 MED ORDER — DULOXETINE HCL 30 MG PO CPEP
30.0000 mg | ORAL_CAPSULE | Freq: Every day | ORAL | Status: DC
  Administered 2018-12-06: 30 mg via ORAL
  Filled 2018-12-05 (×3): qty 1

## 2018-12-05 MED ORDER — TOPIRAMATE 25 MG PO TABS
50.0000 mg | ORAL_TABLET | Freq: Every day | ORAL | Status: DC
  Administered 2018-12-06: 50 mg via ORAL
  Filled 2018-12-05 (×2): qty 2

## 2018-12-05 NOTE — Progress Notes (Signed)
D: Pt was in dayroom upon initial approach.  Pt presents with appropriate affect and mood.  She reports her day has "been real good, I'm feeling tired."  Pt states she "felt good all day" and she "slept better last night."  Her goal is to "behave myself, get along with others, discharge Sunday."  Pt denies SI/HI, denies hallucinations, denies pain.  Pt has been visible in milieu interacting with peers and staff appropriately.    A: Introduced self to pt.  Actively listened to pt and offered support and encouragement.  Medication administered per order.  PRN medication administered for pain and sleep.  15 minute safety checks in place.  R: Pt is safe on the unit.  Pt is compliant with medications.  Pt verbally contracts for safety.  Will continue to monitor and assess.

## 2018-12-05 NOTE — [facility] Suicide Risk Assessment (Signed)
[facility] INPATIENT:  Family/Significant Other Suicide Prevention Education  Suicide Prevention Education:  Education Completed; daughter, Harless Litten [phone] has been identified by the patient as the family member/significant other with whom the patient will be residing, and identified as the person(s) who will aid the patient in the event of a mental health crisis (suicidal ideations/suicide attempt).  With written consent from the patient, the family member/significant other has been provided the following suicide prevention education, prior to the and/or following the discharge of the patient.  The suicide prevention education provided includes the following:  Suicide risk factors  Suicide prevention and interventions  National Suicide Hotline telephone number  [facility] assessment telephone number  [facility] Emergency Assistance [place] and/or Residential Mobile Crisis Unit telephone number  Request made of family/significant other to:  Remove weapons (e.g., guns, rifles, knives), all items previously/currently identified as safety concern.    Remove drugs/medications (over-the-counter, prescriptions, illicit drugs), all items previously/currently identified as a safety concern.  The family member/significant other verbalizes understanding of the suicide prevention education information provided.  The family member/significant other agrees to remove the items of safety concern listed above.   Daughter states she is not aware of guns of weapons in the patient's home (patient has been staying with her mother and sister). Daughter expresses no safety concerns in regard to SI or HI.  Daughter believes that patient's living arrangements are a major stressor. Daughter shares that the patient has warrants out for assaulting her sister, although the patient did not assault her sister, per patient's daughter. Daughter is somewhat concerned that the  patient's sister and patient's mother may try to provoke or harass the patient in an attempt to get law enforcement involved.  Daughter thinks it would be beneficial for further inpatient hospitalization while the patient's medications are adjusted. No other questions or concerns at this time.  Joellen Jersey 12/05/2018, 10:46 AM

## 2018-12-05 NOTE — Plan of Care (Signed)
Progress note  D: pt found in the dayroom interacting; compliant with medication administration. Pt continues to improve. Pt is animated and in good spirits. Pt has no complaints. Pt denies si/hi/ah/vh and verbally agrees to approach staff if these become apparent or before harming himself/others while at [place]: Pt provided support and encouragement. Pt given medication per protocol and standing orders. Q50m safety checks implemented and continued.  R: Pt safe on the unit. Will continue to monitor.  Pt progressing in the following metrics  Problem: Activity: Goal: Interest or engagement in leisure activities will improve Outcome: Progressing Goal: Imbalance in normal sleep/wake cycle will improve Outcome: Progressing   Problem: Education: Goal: Ability to make informed decisions regarding treatment will improve Outcome: Progressing   Problem: Coping: Goal: Coping ability will improve Outcome: Progressing

## 2018-12-05 NOTE — Progress Notes (Signed)
[facility] MD Progress Note  12/05/2018 11:45 AM Darlene Wilcox  MRN:  [id number] Subjective:  Reports partial/gradual improvement compared to admission. Denies medication side effects at this time.  Objective : I have discussed case with treatment team and have met with patient. 58 year old female, presented for worsening depression and anxiety, endorsing neuro-vegetative symptoms and passive SI. Reports she has been facing significant stressors. Her mother/sister are  also her landlord , and patient reports she feels her mother is becoming demented and falsely accusing her of stealing, unreasonably increasing her rent. She also states her sister has been hostile towards her. Reports history of mood instability, has been diagnosed with Bipolar Disorder and PTSD in the past .  Today patient presents with partially improved mood- states she is feeling better, and acknowledges mood and anxiety have improved. She remains ruminative about her stressors, but is future oriented, and states she plans to return home at discharge but will consider finding another place to live if her relationship with her mother/sister ( who are her landlord) remains stressful. No disruptive or agitated behaviors on unit. Visible in day room.  Denies medication side effects.   Principal Problem: Bipolar Disorder , Depressed  Diagnosis: Active Problems:   Bipolar II disorder (HCC)   MDD (major depressive disorder), recurrent severe, without psychosis ([place])  Total Time spent with patient: 20 minutes  Past Psychiatric History:   Past Medical History:  Past Medical History:  Diagnosis Date  . Anxiety   . Asthma    daily inhaler  . Bipolar disorder ([place])   . Bronchitis due to tobacco use 07/2016  . 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 CHF (congestive heart failure) ([place])   . Chronic back pain greater than 3 months duration   . Depression   . Full dentures   . GERD (gastroesophageal reflux disease)   .  Headache(784.0)    1-2 x/month  . History of ankle sprain    right; 2 weeks ago;  c/o severe pain  . 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    Past Surgical History:  Procedure Laterality Date  . ABDOMINAL HYSTERECTOMY     partial  . APPENDECTOMY    . BUNIONECTOMY Left   . CARPAL TUNNEL RELEASE Right 01/13/2013   Procedure: CARPAL TUNNEL RELEASE;  Surgeon: Cammie Sickle., MD;  Location: [place];  Service: Orthopedics;  Laterality: Right;  . CERVICAL FUSION     x 2  . DIAGNOSTIC LAPAROSCOPY  11/08/2006  . INGUINAL HERNIA REPAIR Right   . LAPAROSCOPIC APPENDECTOMY  11/08/2006  . LEFT HEART CATH AND CORONARY ANGIOGRAPHY N/A 08/08/2016   Procedure: Left Heart Cath and Coronary Angiography;  Surgeon: Wellington Hampshire, MD;  Location: [place] [facility];  Service: Cardiovascular;  Laterality: N/A;  . LUMBAR FUSION  06/28/2014   L2-5  . LUMBAR LAMINECTOMY  02/05/2007   L4-5 fusion  . TUBAL LIGATION     Family History:  Family History  Problem Relation Age of Onset  . Lung cancer Father   . Diabetes Maternal Grandmother   . Heart attack Maternal Grandmother   . Deep vein thrombosis Neg Hx    Family Psychiatric  History:  Social History:  Social History   Substance and Sexual Activity  Alcohol Use No  . Alcohol/week: 0.0 standard drinks     Social History   Substance and Sexual Activity  Drug Use  Yes  . Types: Marijuana   Comment: 2 weeks ago    Social History   Socioeconomic History  . Marital status: Divorced    Spouse name: Not on file  . Number of children: Not on file  . Years of education: Not on file  . Highest education level: Not on file  Occupational History  . Occupation: Unemployed  Social Needs  . Financial resource strain: Not on file  . Food insecurity    Worry: Not on file    Inability: Not on file  . Transportation needs    Medical: Not on file    Non-medical: Not on file  Tobacco  Use  . Smoking status: Current Every Day Smoker    Packs/day: 1.00    Years: 20.00    Pack years: 20.00    Types: Cigarettes  . Smokeless tobacco: Never Used  . Tobacco comment:  patient states smokes only 5 cigarettes per day  Substance and Sexual Activity  . Alcohol use: No    Alcohol/week: 0.0 standard drinks  . Drug use: Yes    Types: Marijuana    Comment: 2 weeks ago  . Sexual activity: Not Currently    Birth control/protection: Surgical  Lifestyle  . Physical activity    Days per week: Not on file    Minutes per session: Not on file  . Stress: Not on file  Relationships  . Social Herbalist on phone: Not on file    Gets together: Not on file    Attends religious service: Not on file    Active member of club or organization: Not on file    Attends meetings of clubs or organizations: Not on file    Relationship status: Not on file  Other Topics Concern  . Not on file  Social History Narrative   Lives in [place], [place] with her father.    Additional Social History:    Pain Medications: see MAR Prescriptions: see MAR Over the Counter: see MAR History of alcohol / drug use?: Yes Longest period of sobriety (when/how long): unsure Negative Consequences of Use: Financial, Legal, Personal relationships Withdrawal Symptoms: Other (Comment)(anxiety;, depression) Name of Substance 1: THC 1 - Age of First Use: 58 yrs old 1 - Amount (size/oz): joint or two 1 - Frequency: couple times week 1 - Duration: since age of 58 yrs old 1 - Last Use / Amount: 2 weeks ago  Sleep: fair, improving   Appetite:  Good  Current Medications: Current Facility-Administered Medications  Medication Dose Route Frequency Provider Last Rate Last Dose  . acetaminophen (TYLENOL) tablet 650 mg  650 mg Oral Q6H PRN Suella Broad, FNP      . albuterol (VENTOLIN HFA) 108 (90 Base) MCG/ACT inhaler 1-2 puff  1-2 puff Inhalation Q6H PRN Lindon Romp A, NP      . alum & mag  hydroxide-simeth (MAALOX/MYLANTA) 200-200-20 MG/5ML suspension 30 mL  30 mL Oral Q4H PRN Burt Ek, Gayland Curry, FNP      . aspirin chewable tablet 81 mg  81 mg Oral Daily Toniyah Dilmore, Myer Peer, MD   81 mg at 12/05/18 0837  . clonazePAM (KLONOPIN) tablet 1 mg  1 mg Oral BID Kearstyn Avitia, Myer Peer, MD   1 mg at 12/05/18 7893  . DULoxetine (CYMBALTA) DR capsule 20 mg  20 mg Oral Daily Johne Buckle, Myer Peer, MD   20 mg at 12/05/18 0837  . lamoTRIgine (LAMICTAL) tablet 25 mg  25 mg Oral Daily Rashad Auld,  Myer Peer, MD   25 mg at 12/05/18 [id number]  . losartan (COZAAR) tablet 25 mg  25 mg Oral Daily Lindon Romp A, NP   25 mg at 12/05/18 5400  . lurasidone (LATUDA) tablet 20 mg  20 mg Oral Q breakfast Nolyn Swab, Myer Peer, MD   20 mg at 12/05/18 1027  . magnesium hydroxide (MILK OF MAGNESIA) suspension 30 mL  30 mL Oral Daily PRN Suella Broad, FNP      . Mometasone Furoate AERO 2 puff  2 puff Inhalation BID Lindon Romp A, NP   2 puff at 12/05/18 0837  . nicotine (NICODERM CQ - dosed in mg/24 hours) patch 21 mg  21 mg Transdermal Daily Pranshu Lyster, Myer Peer, MD   21 mg at 12/05/18 0837  . omega-3 acid ethyl esters (LOVAZA) capsule 1 g  1 g Oral BID Meryem Haertel, Myer Peer, MD   1 g at 12/05/18 0837  . pantoprazole (PROTONIX) EC tablet 40 mg  40 mg Oral Daily Ixchel Duck, Myer Peer, MD   40 mg at 12/05/18 0837  . [START ON 12/06/2018] topiramate (TOPAMAX) tablet 50 mg  50 mg Oral Daily Zyara Riling, Myer Peer, MD      . traZODone (DESYREL) tablet 50 mg  50 mg Oral QHS PRN Rohan Juenger, Myer Peer, MD   50 mg at 12/04/18 2245    Lab Results:  Results for orders placed or performed during the hospital encounter of 12/02/18 (from the past 48 hour(s))  Hemoglobin A1c     Status: None   Collection Time: 12/03/18  6:09 PM  Result Value Ref Range   Hgb A1c MFr Bld 5.2 4.8 - 5.6 %    Comment: (NOTE)         Prediabetes: 5.7 - 6.4         Diabetes: >6.4         Glycemic control for adults with diabetes: <7.0    Mean Plasma Glucose 103 mg/dL     Comment: (NOTE) Performed At: [facility] [place], [place] [id number] Rush Farmer MD TO:[id number]   Lipid panel     Status: Abnormal   Collection Time: 12/03/18  6:09 PM  Result Value Ref Range   Cholesterol 229 (H) 0 - 200 mg/dL   Triglycerides 466 (H) <150 mg/dL   HDL 25 (L) >40 mg/dL   Total CHOL/HDL Ratio 9.2 RATIO   VLDL UNABLE TO CALCULATE IF TRIGLYCERIDE OVER 400 mg/dL 0 - 40 mg/dL   LDL Cholesterol UNABLE TO CALCULATE IF TRIGLYCERIDE OVER 400 mg/dL 0 - 99 mg/dL    Comment:        Total Cholesterol/HDL:CHD Risk Coronary Heart Disease Risk Table                     Men   Women  1/2 Average Risk   3.4   3.3  Average Risk       5.0   4.4  2 X Average Risk   9.6   7.1  3 X Average Risk  23.4   11.0        Use the calculated Patient Ratio above and the CHD Risk Table to determine the patient's CHD Risk.        ATP III CLASSIFICATION (LDL):  <100     mg/dL   Optimal  100-129  mg/dL   Near or Above  Optimal  130-159  mg/dL   Borderline  160-189  mg/dL   High  >190     mg/dL   Very High Performed at [place] [address] [place], [place] [address]   TSH     Status: None   Collection Time: 12/03/18  6:09 PM  Result Value Ref Range   TSH 1.880 0.350 - 4.500 uIU/mL    Comment: Performed by a 3rd Generation assay with a functional sensitivity of <=0.01 uIU/mL. Performed at [facility], [place] [address], [place], [place] [address]   LDL cholesterol, direct     Status: Abnormal   Collection Time: 12/03/18  6:09 PM  Result Value Ref Range   Direct LDL 115.5 (H) 0 - 99 mg/dL    Comment: Performed at [place] [address], [place], [place] [address]    Blood Alcohol level:  Lab Results  Component Value Date   [facility] <10 12/01/2018   ETH <10 75/64/3329    Metabolic Disorder Labs: Lab Results  Component Value Date   HGBA1C 5.2 12/03/2018   MPG 103 12/03/2018   MPG 108  08/06/2016   No results found for: PROLACTIN Lab Results  Component Value Date   CHOL 229 (H) 12/03/2018   TRIG 466 (H) 12/03/2018   HDL 25 (L) 12/03/2018   CHOLHDL 9.2 12/03/2018   VLDL UNABLE TO CALCULATE IF TRIGLYCERIDE OVER 400 mg/dL 12/03/2018   LDLCALC UNABLE TO CALCULATE IF TRIGLYCERIDE OVER 400 mg/dL 12/03/2018   LDLCALC 80 08/06/2016    Physical Findings: AIMS: Facial and Oral Movements Muscles of Facial Expression: None, normal Lips and Perioral Area: None, normal Jaw: None, normal Tongue: None, normal,Extremity Movements Upper (arms, wrists, hands, fingers): None, normal Lower (legs, knees, ankles, toes): None, normal, Trunk Movements Neck, shoulders, hips: None, normal, Overall Severity Severity of abnormal movements (highest score from questions above): None, normal Incapacitation due to abnormal movements: None, normal Patient's awareness of abnormal movements (rate only patient's report): No Awareness, Dental Status Current problems with teeth and/or dentures?: No Does patient usually wear dentures?: No  CIWA:  CIWA-Ar Total: 2 COWS:  COWS Total Score: 3  Musculoskeletal: Strength & Muscle Tone: within normal limits Gait & Station: normal Patient leans: N/A  Psychiatric Specialty Exam: Physical Exam  ROS denies headache, no chest pain, no shortness of breath, no cough, no fever or chills   Blood pressure 122/79, pulse 93, temperature 98.3 F (36.8 C), temperature source Oral, resp. rate 16, height '5\' 2"'$  (1.575 m), weight 73 kg, SpO2 99 %.Body mass index is 29.45 kg/m.  General Appearance: improving grooming   Eye Contact:  Good  Speech:  Normal Rate  Volume:  Normal  Mood:  gradually improving mood   Affect:  partially improved, less severely anxious   Thought Process:  Linear and Descriptions of Associations: Intact  Orientation:  Full (Time, Place, and Person)  Thought Content:  no hallucinations, no delusions  Suicidal Thoughts:  No denies suicidal  or self injurious ideations, contracts for safety on unit   Homicidal Thoughts:  No specifically also denies any homicidal or violent ideations towards her mother , sister .   Memory:  recent and remote grossly intact   Judgement:  improving   Insight:  Fair  Psychomotor Activity:  Normal  Concentration:  Concentration: Good and Attention Span: Good  Recall:  Good  Fund of Knowledge:  Good  Language:  Good  Akathisia:  Negative  Handed:  Right  AIMS (if indicated):     Assets:  Desire for Improvement Resilience  ADL's:  Intact  Cognition:  WNL  Sleep:  Number of Hours: 5   Assessment -  58 year old female, presented for worsening depression and anxiety, endorsing neuro-vegetative symptoms and passive SI. Reports she has been facing significant stressors. Her mother/sister are  also her landlord , and patient reports she feels her mother is becoming demented and falsely accusing her of stealing, unreasonably increasing her rent. She also states her sister has been hostile towards her. Reports history of mood instability, has been diagnosed with Bipolar Disorder and PTSD in the past .  Patient presents with gradual improvement and describes improving mood and decreasing anxiety. Reports she slept better last night, as well. Currently denies SI. We have reviewed medication regimen further. Patient agrees with goal of simplifying medication regimen, decreasing polypharmacy. She reports long term Klonopin treatment and currently not interested in detoxification/taper. We have reviewed potential side effects to include abuse/addictive potential, WDL risks if stopped abruptly, possible cognitive side effects. She expresses interest in [facility] for further management of mood disorder, depression. Side effects reviewed . She reports Buspar was only partially effective, even at high doses, and we will continue to taper Buspar and Topamax down/off   Treatment Plan Summary: Daily contact with patient to  assess and evaluate symptoms and progress in treatment, Medication management, Plan inpatient treatment and medications as below  Treatment Team reviewed as below today 7/10 Encourage group and milieu participation D/C Buspar  Continue  Cymbalta  30 mgrs QDAY for depression, anxiety Continue Lamotrigine 25 mgrs QDAY for mood disorder, depression Decrease Topamax to 50 mgrs QDAY  Start Latuda 20 mgrs QDAY for mood disorder  Continue  Lovaza ( Omega 3 ) for hypertriglyceridemia- will follow up with her PCP at discharge for further management of hyperlipidemia.  Continue Trazodone 50 mgrs QHS PRN for insomnia  Continue Losartan for HTN Treatment team working on disposition planning options  Jenne Campus, MD 12/05/2018, 11:45 AM   Patient ID: [name], female   DOB: May 25, 1961, 58 y.o.   MRN: [id number]

## 2018-12-06 MED ORDER — LAMOTRIGINE 25 MG PO TABS: 25.0000 mg | ORAL_TABLET | Freq: Every day | ORAL | 0 refills | Status: DC

## 2018-12-06 MED ORDER — DULOXETINE HCL 30 MG PO CPEP: 30.0000 mg | ORAL_CAPSULE | Freq: Every day | ORAL | 0 refills | Status: DC

## 2018-12-06 MED ORDER — LURASIDONE HCL 20 MG PO TABS: 20.0000 mg | ORAL_TABLET | Freq: Every day | ORAL | 0 refills | Status: DC

## 2018-12-06 MED ORDER — TOPIRAMATE 25 MG PO TABS
50.0000 mg | ORAL_TABLET | Freq: Two times a day (BID) | ORAL | Status: DC
  Filled 2018-12-06 (×3): qty 2

## 2018-12-06 MED ORDER — OMEGA-3-ACID ETHYL ESTERS 1 G PO CAPS: 1.0000 g | ORAL_CAPSULE | Freq: Two times a day (BID) | ORAL | 0 refills | Status: DC

## 2018-12-06 MED ORDER — TOPIRAMATE 100 MG PO TABS
100.0000 mg | ORAL_TABLET | Freq: Two times a day (BID) | ORAL | Status: DC
  Filled 2018-12-06 (×5): qty 1

## 2018-12-06 NOTE — Progress Notes (Signed)
  [facility] Adult Case Management Discharge Plan :  Will you be returning to the same living situation after discharge:  Yes,  states she is returning to her trailer At discharge, do you have transportation home?: Yes,  needs to be arranged to get back to [facility] Do you have the ability to pay for your medications: Yes,  denies barriers  Release of information consent forms completed and turned in to Medical Records by CSW.   Patient to Follow up at: Follow-up Information    [facility] Follow up.   Why: Hospital discharge appointment has not yet been set.  Social worker will call you Monday with the details. Contact information: [address] [place] [address] [phone]           Next level of care provider has access to [place] and Suicide Prevention discussed: Yes,  with daughter  Have you used any form of tobacco in the last 30 days? (Cigarettes, Smokeless Tobacco, Cigars, and/or Pipes): Yes  Has patient been referred to the Quitline?: Patient refused referral  Patient has been referred for addiction treatment: N/A  Maretta Los, LCSW 12/06/2018, 11:08 AM

## 2018-12-06 NOTE — Progress Notes (Addendum)
D. Pt in bed sleeping upon initial approach- easily aroused from sleep for am meds. Pt reports having slept well last night, but is still groggy. Pt is friendly during interactions- reports some chronic lower back pain 7/10 Pt currently denies SI/HI and AVH   A. Labs and vitals monitored. Pt compliant with medications. Pt given heat pack and tylenol for back pain Pt supported emotionally and encouraged to express concerns and ask questions.   R. Pt remains safe with 15 minute checks. Will continue POC.
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
## 2018-12-06 NOTE — Progress Notes (Signed)
Pt discharged to lobby. Pt was stable and appreciative at that time. All papers  were given and valuables returned. Verbal understanding expressed. Denies SI/HI and A/VH. Pt given opportunity to express concerns and ask questions. °

## 2018-12-06 NOTE — Progress Notes (Signed)
[facility] NOVEL CORONAVIRUS (COVID-19) DAILY CHECK-OFF SYMPTOMS - answer yes or no to each - every day NO YES  Have you had a fever in the past 24 hours?  . Fever (Temp > 37.80C / 100F) X   Have you had any of these symptoms in the past 24 hours? . New Cough .  Sore Throat  .  Shortness of Breath .  Difficulty Breathing .  Unexplained Body Aches   X   Have you had any one of these symptoms in the past 24 hours not related to allergies?   . Runny Nose .  Nasal Congestion .  Sneezing   X   If you have had runny nose, nasal congestion, sneezing in the past 24 hours, has it worsened?  X   EXPOSURES - check yes or no X   Have you traveled outside the state in the past 14 days?  X   Have you been in contact with someone with a confirmed diagnosis of COVID-19 or PUI in the past 14 days without wearing appropriate PPE?  X   Have you been living in the same home as a person with confirmed diagnosis of COVID-19 or a PUI (household contact)?    X   Have you been diagnosed with COVID-19?    X              What to do next: Answered NO to all: Answered YES to anything:   Proceed with unit schedule Follow the [facility] Inpatient Flowsheet.   
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
## 2018-12-16 NOTE — Progress Notes (Signed)
Patient ID: Darlene Wilcox, female   DOB: 1960/09/09, 58 y.o.   MRN: [id number]   Pt called stating that she was having a reaction to her medication. Pt was told to stop all medication and go to her nearest ER. Pt stated that she did not want to go, and insisted to speak to the doctor. The doctor called the pt twice, and pt did not answer. Will try to call her again.

## 2018-12-16 NOTE — Progress Notes (Signed)
Patient ID: Darlene Wilcox, female   DOB: June 08, 1960, 58 y.o.   MRN: [id number] Patient called unit and spoke with RN., reporting allergic reaction to medication I have contacted patient via phone. Reports she developed rash, due to which she already stopped Lamotrigine. States rash now improved . Currently denies blistering, mucosal involvement or  systemic symptoms.  I have advised her not to restart Lamotrigine, go to ED if any concerns, including any worsening of rash or emergence of other symptoms, and keep her appointment with her MD, which she states is in two days. Patient reported mood has improved and denied any SI at this time   Gabriel Earing, MD

## 2018-12-21 ENCOUNTER — Other Ambulatory Visit: Payer: Self-pay

## 2018-12-21 ENCOUNTER — Emergency Department (HOSPITAL_COMMUNITY)
Admission: EM | Admit: 2018-12-21 | Discharge: 2018-12-21 | Disposition: A | Discharge status: Home / Self Care | Payer: Medicare Other | Attending: Emergency Medicine | Admitting: Emergency Medicine

## 2018-12-21 ENCOUNTER — Encounter (HOSPITAL_COMMUNITY): Payer: Self-pay | Admitting: Emergency Medicine

## 2018-12-21 DIAGNOSIS — F17210 Nicotine dependence, cigarettes, uncomplicated: Secondary | ICD-10-CM | POA: Diagnosis not present

## 2018-12-21 DIAGNOSIS — H1033 Unspecified acute conjunctivitis, bilateral: Secondary | ICD-10-CM | POA: Insufficient documentation

## 2018-12-21 DIAGNOSIS — J45909 Unspecified asthma, uncomplicated: Secondary | ICD-10-CM | POA: Insufficient documentation

## 2018-12-21 DIAGNOSIS — H1013 Acute atopic conjunctivitis, bilateral: Secondary | ICD-10-CM | POA: Diagnosis not present

## 2018-12-21 DIAGNOSIS — I5021 Acute systolic (congestive) heart failure: Secondary | ICD-10-CM | POA: Diagnosis not present

## 2018-12-21 DIAGNOSIS — H11433 Conjunctival hyperemia, bilateral: Secondary | ICD-10-CM | POA: Diagnosis present

## 2018-12-21 DIAGNOSIS — Z79899 Other long term (current) drug therapy: Secondary | ICD-10-CM | POA: Diagnosis not present

## 2018-12-21 MED ORDER — OLOPATADINE HCL 0.1 % OP SOLN
1.0000 [drp] | Freq: Two times a day (BID) | OPHTHALMIC | Status: DC
  Filled 2018-12-21: qty 5

## 2018-12-21 MED ORDER — DIPHENHYDRAMINE HCL 25 MG PO TABS: 25.0000 mg | ORAL_TABLET | Freq: Four times a day (QID) | ORAL | 0 refills | Status: AC | PRN

## 2018-12-21 MED ORDER — OLOPATADINE HCL 0.1 % OP SOLN: 1.0000 [drp] | Freq: Two times a day (BID) | OPHTHALMIC | 0 refills | Status: AC

## 2018-12-21 MED ORDER — OLOPATADINE HCL 0.1 % OP SOLN: 1.0000 [drp] | Freq: Two times a day (BID) | OPHTHALMIC | 12 refills | Status: DC

## 2018-12-21 MED ORDER — DIPHENHYDRAMINE HCL 25 MG PO TABS: 25.0000 mg | ORAL_TABLET | Freq: Four times a day (QID) | ORAL | 0 refills | Status: DC | PRN

## 2018-12-21 MED ORDER — FLUORESCEIN SODIUM 1 MG OP STRP
1.0000 | ORAL_STRIP | Freq: Once | OPHTHALMIC | Status: AC
  Administered 2018-12-21: 1 via OPHTHALMIC
  Filled 2018-12-21: qty 1

## 2018-12-21 MED ORDER — OLOPATADINE HCL 0.1 % OP SOLN: 1.0000 [drp] | Freq: Two times a day (BID) | OPHTHALMIC | 0 refills | Status: DC

## 2018-12-21 MED ORDER — ERYTHROMYCIN 5 MG/GM OP OINT
TOPICAL_OINTMENT | Freq: Once | OPHTHALMIC | Status: AC
  Administered 2018-12-21: 1 via OPHTHALMIC
  Filled 2018-12-21: qty 3.5

## 2018-12-21 MED ORDER — TETRACAINE HCL 0.5 % OP SOLN
1.0000 [drp] | Freq: Once | OPHTHALMIC | Status: AC
  Administered 2018-12-21: 1 [drp] via OPHTHALMIC
  Filled 2018-12-21: qty 4

## 2018-12-21 NOTE — ED Triage Notes (Signed)
Pt states that is having problems with her eyes she states that she makeup in both eye last week.

## 2018-12-21 NOTE — ED Provider Notes (Signed)
[facility] EMERGENCY DEPARTMENT Provider Note   CSN: [id number] Arrival date & time: 12/21/18  1439     History   Chief Complaint Chief Complaint  Patient presents with  . Eye Problem    HPI Darlene Wilcox is a 58 y.o. female.     HPI  Patient is a 58 year old female with past medical history of bipolar disorder, GERD, asthma, headaches, presenting for eye drainage and erythema.  Patient reports that the symptoms have been occurring for the past week.  She reports that she has had irritation surrounding the eye and "bumps" around her eyelids.  She reports that last night she had some purulent drainage and woke up this morning with her eye matted shut due to the drainage.  Denies proptosis or pain with extraocular motions.  Denies circumscribed erythema around the eyes.  Denies fevers, chills, polyarthralgias.  Denies any injury to the eyes.  She does report that she used a new ocular make-up within the last week however she is discontinued this now.  She did also dye her hair a little over a week ago with bleach hair dye, however she has used it before.  She recently started [place], Lamictal, and duloxetine.  Past Medical History:  Diagnosis Date  . Anxiety   . Asthma    daily inhaler  . Bipolar disorder ([place])   . Bronchitis due to tobacco use 07/2016  . 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 CHF (congestive heart failure) ([place])   . Chronic back pain greater than 3 months duration   . Depression   . Full dentures   . GERD (gastroesophageal reflux disease)   . Headache(784.0)    1-2 x/month  . History of ankle sprain    right; 2 weeks ago;  c/o severe pain  . Seizures ([place])    states "silent seizures"; is on anticonvulsant; none in 2-3 mos.    Patient Active Problem List   Diagnosis Date Noted  . MDD (major depressive disorder), recurrent severe, without psychosis ([place]) 12/02/2018  . Cocaine abuse ([place])   . Anemia   . Hypotension 01/31/2017  . AKI (acute  kidney injury) ([place])   . Sepsis ([place])   . Slurred speech   . Acute hypoxemic respiratory failure ([place]) 08/12/2016  . Superficial thrombophlebitis 08/12/2016  . Acute congestive heart failure ([place]) 08/07/2016  . Acute systolic CHF (congestive heart failure) ([place])   . Chest pain 08/06/2016  . Prolonged QT interval 08/06/2016  . Volume overload 08/05/2016  . Contusion of hip 05/08/2016  . Asthma, chronic 08/07/2014  . Seizures ([place]) 02/23/2014  . Bipolar II disorder ([place]) 11/13/2006  . DISORDER, TOBACCO USE 11/13/2006  . GERD 11/13/2006  . BACK PAIN, CHRONIC 11/13/2006  . HX, PERSONAL, ALCOHOLISM 11/13/2006    Past Surgical History:  Procedure Laterality Date  . ABDOMINAL HYSTERECTOMY     partial  . APPENDECTOMY    . BUNIONECTOMY Left   . CARPAL TUNNEL RELEASE Right 01/13/2013   Procedure: CARPAL TUNNEL RELEASE;  Surgeon: Cammie Sickle., MD;  Location: [facility];  Service: Orthopedics;  Laterality: Right;  . CERVICAL FUSION     x 2  . DIAGNOSTIC LAPAROSCOPY  11/08/2006  . INGUINAL HERNIA REPAIR Right   . LAPAROSCOPIC APPENDECTOMY  11/08/2006  . LEFT HEART CATH AND CORONARY ANGIOGRAPHY N/A 08/08/2016   Procedure: Left Heart Cath and Coronary Angiography;  Surgeon: Wellington Hampshire, MD;  Location: [facility];  Service: Cardiovascular;  Laterality: N/A;  . LUMBAR FUSION  06/28/2014   L2-5  . LUMBAR LAMINECTOMY  02/05/2007   L4-5 fusion  . TUBAL LIGATION       OB History    Gravida  2   Para  2   Term  2   Preterm      AB      Living        SAB      TAB      Ectopic      Multiple      Live Births               Home Medications    Prior to Admission medications   Medication Sig Start Date End Date Taking? Authorizing Provider  ASMANEX HFA 200 MCG/ACT AERO Inhale 2 puffs into the lungs 2 (two) times daily. 07/07/18   [provider]  aspirin 325 MG tablet Take 325 mg by mouth daily.    [provider]   clonazePAM (KLONOPIN) 1 MG tablet Take 1 mg by mouth 3 (three) times daily.    [provider]  cyclobenzaprine (FLEXERIL) 10 MG tablet Take 1 tablet (10 mg total) by mouth 3 (three) times daily. Patient taking differently: Take 10 mg by mouth 3 (three) times daily as needed.  11/27/18   Lily Kocher, PA-C  diphenhydrAMINE (BENADRYL) 25 MG tablet Take 1 tablet (25 mg total) by mouth every 6 (six) hours as needed for itching. 12/21/18   Langston Masker B, PA-C  DULoxetine (CYMBALTA) 30 MG capsule Take 1 capsule (30 mg total) by mouth daily. 12/07/18   Starkes-Perry, Gayland Curry, FNP  esomeprazole (NEXIUM) 40 MG capsule Take 40 mg by mouth daily at 12 noon.    [provider]  lamoTRIgine (LAMICTAL) 25 MG tablet Take 1 tablet (25 mg total) by mouth daily. 12/07/18   Starkes-Perry, Gayland Curry, FNP  losartan (COZAAR) 25 MG tablet Take 25 mg by mouth daily.    [provider]  lurasidone (LATUDA) 20 MG TABS tablet Take 1 tablet (20 mg total) by mouth daily with breakfast. 12/07/18   Starkes-Perry, Gayland Curry, FNP  mirabegron ER (MYRBETRIQ) 25 MG TB24 tablet Take 25 mg by mouth daily.     [provider]  Multiple Vitamin (MULTIVITAMIN WITH MINERALS) TABS tablet Take 1 tablet by mouth daily.    [provider]  olopatadine (PATADAY) 0.1 % ophthalmic solution Place 1 drop into both eyes 2 (two) times daily for 7 days. 12/21/18 12/28/18  Langston Masker B, PA-C  omega-3 acid ethyl esters (LOVAZA) 1 g capsule Take 1 capsule (1 g total) by mouth 2 (two) times daily. 12/06/18   Starkes-Perry, Gayland Curry, FNP  PROAIR HFA 108 (90 Base) MCG/ACT inhaler INHALE 2 PUFFS INTO THE LUNGS EVERY 4 HOURS AS NEEDED FOR WHEEZING ORSHORTNESS OF BREATH. Patient taking differently: Inhale 1-2 puffs into the lungs every 6 (six) hours as needed for wheezing or shortness of breath.  09/19/16   Claretta Fraise, MD  topiramate (TOPAMAX) 100 MG tablet Take 100 mg by mouth 2 (two) times daily.  03/31/14   Dewain Penning, MD    Family History Family History  Problem Relation Age of Onset  . Lung cancer Father   . Diabetes Maternal Grandmother   . Heart attack Maternal Grandmother   . Deep vein thrombosis Neg Hx     Social History Social History   Tobacco Use  . Smoking status: Current Every Day Smoker  Packs/day: 1.00    Years: 20.00    Pack years: 20.00    Types: Cigarettes  . Smokeless tobacco: Never Used  . Tobacco comment:  patient states smokes only 5 cigarettes per day  Substance Use Topics  . Alcohol use: No    Alcohol/week: 0.0 standard drinks  . Drug use: Yes    Types: Marijuana    Comment: 2 weeks ago     Allergies   Apple, Penicillins, and Bee venom   Review of Systems Review of Systems  Constitutional: Negative for chills and fever.  Eyes: Positive for discharge, redness and itching.  Gastrointestinal: Negative for nausea and vomiting.  Musculoskeletal: Negative for arthralgias and joint swelling.  Skin: Positive for rash.     Physical Exam Updated Vital Signs BP 110/68 (BP Location: Right Arm)   Pulse 90   Temp 98.6 F (37 C) (Oral)   Resp 16   Ht 5\' 2"  (1.575 m)   Wt 71.7 kg   SpO2 95%   BMI 28.90 kg/m   Physical Exam Vitals signs and nursing note reviewed.  Constitutional:      General: She is not in acute distress.    Appearance: She is well-developed. She is not diaphoretic.     Comments: Sitting comfortably in bed.  HENT:     Head: Normocephalic and atraumatic.     Mouth/Throat:     Mouth: Mucous membranes are moist.  Eyes:     General:        Right eye: Discharge present.        Left eye: Discharge present.    Extraocular Movements: Extraocular movements intact.     Pupils: Pupils are equal, round, and reactive to light.     Comments: Bilateral eyes with maculopapular rash around the orbit and extending across the nasal bridge.  No proptosis.  EOMs intact.  No circumscribed erythema or periocular swelling.  Conjunctiva  erythematous bilaterally.  There is scleral injection symmetric bilaterally.  Slit-lamp exam reveals no obvious cell or flare.  Fluorescein stain reveals no focal uptake.  Neck:     Musculoskeletal: Normal range of motion.  Cardiovascular:     Rate and Rhythm: Normal rate and regular rhythm.     Comments: Intact, 2+ radial pulse. Abdominal:     General: There is no distension.  Musculoskeletal: Normal range of motion.  Skin:    General: Skin is warm and dry.  Neurological:     Mental Status: She is alert.     Comments: Cranial nerves intact to gross observation. Patient moves extremities without difficulty.  Psychiatric:        Behavior: Behavior normal.        Thought Content: Thought content normal.        Judgment: Judgment normal.      ED Treatments / Results  Labs (all labs ordered are listed, but only abnormal results are displayed) Labs Reviewed - No data to display  EKG None  Radiology No results found.  Procedures Procedures (including critical care time)  Medications Ordered in ED Medications  tetracaine (PONTOCAINE) 0.5 % ophthalmic solution 1 drop (1 drop Both Eyes Given 12/21/18 1611)  fluorescein ophthalmic strip 1 strip (1 strip Both Eyes Given 12/21/18 1611)  erythromycin ophthalmic ointment (1 application Both Eyes Given 12/21/18 1644)     Initial Impression / Assessment and Plan / ED Course  I have reviewed the triage vital signs and the nursing notes.  Pertinent labs & imaging results that were  available during my care of the patient were reviewed by me and considered in my medical decision making (see chart for details).        This is a well-appearing 58 year old female with past medical history of asthma, bipolar disorder, GERD presenting for bilaterally erythematous eyes with surrounding maculopapular rash.  Examination consistent with contact dermatitis/allergic conjunctivitis with superimposed bacterial conjunctivitis, possibly due to  aggressive itching of eyes.  Examination not consistent with iritis, scleritis, episcleritis, herpes keratitis, corneal ulcer or corneal abrasion.  Based on history, suspect that patient had a contact reaction to the new make-up that she used versus the hair dye.  She does not have any other signs or symptoms of allergic reaction on the head and neck.  Reviewed the patient's medication list with pharmacist, Apolonio Schneiders at [facility] who states that this reaction is not consistent with any known documented allergic reactions to Latuda, Lamictal, or Cymbalta.  Patient instructed to continue these medications.  Patient prescribed erythromycin ointment, olopatadine drops and Benadryl as needed for itching.  She is instructed to follow-up with her primary care provider versus ophthalmology in 1 week for recheck.  She is instructed to return sooner if she experiences increasing erythema, drainage or swelling.  Patient is in understanding and agrees with the plan of care.  Final Clinical Impressions(s) / ED Diagnoses   Final diagnoses:  Acute bacterial conjunctivitis of both eyes  Allergic conjunctivitis, acute, bilateral      Albesa Seen, PA-C 12/21/18 1802    Hayden Rasmussen, MD 12/22/18 [id number]

## 2018-12-21 NOTE — Discharge Instructions (Addendum)
Please read and follow all provided instructions.  Your diagnoses today include:  1. Acute bacterial conjunctivitis of both eyes   2. Allergic conjunctivitis, acute, bilateral     Tests performed today include: Visual acuity testing to check your vision Fluorescein dye examination to look for scratches on your eye Tonometry to check the pressure inside of your eye Vital signs. See below for your results today.   Medications prescribed:   Take any prescribed medications only as directed.  Please place a ribbon of erythromycin ointment in your lower lid and blink to spread across your eye up to 6 times a day.  Continue for 7 days.  Once you obtain the olopatadine drops, place 1 drop into each eye twice a day for 7 days.  You may take Benadryl, 25 mg every 6 hours as needed for itching and irritation.  Home care instructions:  Follow any educational materials contained in this packet. If you wear contact lenses, do not use them until your eye caregiver approves. Follow-up care is necessary to be sure the infection is healing if not completely resolved in 2-3 days. See your caregiver or eye specialist as suggested for followup.   If you have an eye infection, wash your hands often as this is very contagious and is easily spread from person to person.   Follow-up instructions: Please follow-up with your primary care doctor OR the opthalmologist listed in the next 2-3 days for further evaluation of your symptoms.  Return instructions:  Please return to the Emergency Department if you experience worsening symptoms.  Please return immediately if you develop severe pain, pus drainage, new change in vision, or fever. Please return if you have any other emergent concerns.  Additional Information:  Your vital signs today were: BP 110/68 (BP Location: Right Arm)    Pulse 90    Temp 98.6 F (37 C) (Oral)    Resp 16    Ht 5\' 2"  (1.575 m)    Wt 71.7 kg    SpO2 95%    BMI 28.90 kg/m  If your  blood pressure (BP) was elevated above 130/80 this visit, please have this repeated by your doctor within one month. ---------------

## 2019-01-16 DIAGNOSIS — F411 Generalized anxiety disorder: Secondary | ICD-10-CM | POA: Insufficient documentation

## 2019-02-24 DIAGNOSIS — R32 Unspecified urinary incontinence: Secondary | ICD-10-CM | POA: Insufficient documentation

## 2019-05-05 ENCOUNTER — Telehealth: Payer: Self-pay

## 2019-05-05 NOTE — Telephone Encounter (Signed)
NOTES ON FILE FROM [facility] [phone], REFERRAL SENT TO SCHEDULING

## 2019-05-05 NOTE — Telephone Encounter (Signed)
NOTES ON FILE FROM [facility] [phone], REFERRAL SENT TO SCHEDULING

## 2019-06-18 ENCOUNTER — Other Ambulatory Visit: Payer: Self-pay

## 2019-06-18 ENCOUNTER — Ambulatory Visit (INDEPENDENT_AMBULATORY_CARE_PROVIDER_SITE_OTHER): Payer: Medicare HMO | Admitting: Cardiology

## 2019-06-18 ENCOUNTER — Encounter: Payer: Self-pay | Admitting: Cardiology

## 2019-06-18 VITALS — BP 107/67 | HR 83 | Ht 62.0 in | Wt 158.0 lb

## 2019-06-18 DIAGNOSIS — R0789 Other chest pain: Secondary | ICD-10-CM

## 2019-06-18 DIAGNOSIS — I428 Other cardiomyopathies: Secondary | ICD-10-CM | POA: Diagnosis not present

## 2019-06-18 DIAGNOSIS — R0602 Shortness of breath: Secondary | ICD-10-CM

## 2019-06-18 NOTE — Progress Notes (Signed)
Clinical Summary Ms. Goding is a 59 y.o.female seen today for follow up of the following medical problems.   1. NICM - 07/2016 echo LVEF 40-45% - 01/2017 LVEF 50-55% - Cardiac catheterization performed on 08/08/2016 showed 10% RCA, normal LVEDP, EF 40%. No significant coronary artery disease seen. - medical therapy limited by soft bp's  - has had some SOB which is chronic. Ran out of her inhalers, establishing with a new pcp this week  - no recent edema - compliant with meds   2. Chest pain Left sided stabbing left sided. Can occur at any time. Can go into left arm. Can last up to 24 hours without relief. Not positional  3. Hyperliidemia -11/2018 collected 6pm, likely not fasting.    SH: mother is Tempie Hoist, also a pateint of mine.    Past Medical History:  Diagnosis Date  . Anxiety   . Asthma    daily inhaler  . Bipolar disorder ([place])   . Bronchitis due to tobacco use 07/2016  . 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 CHF (congestive heart failure) ([place])   . Chronic back pain greater than 3 months duration   . Depression   . Full dentures   . GERD (gastroesophageal reflux disease)   . Headache(784.0)    1-2 x/month  . History of ankle sprain    right; 2 weeks ago;  c/o severe pain  . Seizures ([place])    states "silent seizures"; is on anticonvulsant; none in 2-3 mos.     Allergies  Allergen Reactions  . Apple Anaphylaxis and Shortness Of Breath  . Penicillins Anaphylaxis, Shortness Of Breath, Itching and Swelling    Has patient had a PCN reaction causing immediate rash, facial/tongue/throat swelling, SOB or lightheadedness with hypotension: Unknown, childhood reaction Has patient had a PCN reaction causing severe rash involving mucus membranes or skin necrosis: No Has patient had a PCN reaction that required hospitalization No Has patient had a PCN reaction occurring within the last 10 years: No If all of the above answers are "NO", then may  proceed with Cephalosporin use.   . Bee Venom Swelling    Takes benadryl to help with reaction     Current Outpatient Medications  Medication Sig Dispense Refill  . ASMANEX HFA 200 MCG/ACT AERO Inhale 2 puffs into the lungs 2 (two) times daily.    Marland Kitchen aspirin 325 MG tablet Take 325 mg by mouth daily.    . clonazePAM (KLONOPIN) 1 MG tablet Take 1 mg by mouth 3 (three) times daily.    . cyclobenzaprine (FLEXERIL) 10 MG tablet Take 1 tablet (10 mg total) by mouth 3 (three) times daily. (Patient taking differently: Take 10 mg by mouth 3 (three) times daily as needed. ) 20 tablet 0  . diphenhydrAMINE (BENADRYL) 25 MG tablet Take 1 tablet (25 mg total) by mouth every 6 (six) hours as needed for itching. 30 tablet 0  . DULoxetine (CYMBALTA) 30 MG capsule Take 1 capsule (30 mg total) by mouth daily. 30 capsule 0  . esomeprazole (NEXIUM) 40 MG capsule Take 40 mg by mouth daily at 12 noon.    . lamoTRIgine (LAMICTAL) 25 MG tablet Take 1 tablet (25 mg total) by mouth daily. 30 tablet 0  . losartan (COZAAR) 25 MG tablet Take 25 mg by mouth daily.    Marland Kitchen lurasidone (LATUDA) 20 MG TABS tablet Take 1 tablet (20 mg total) by mouth daily with breakfast. 30 tablet 0  .  mirabegron ER (MYRBETRIQ) 25 MG TB24 tablet Take 25 mg by mouth daily.     . Multiple Vitamin (MULTIVITAMIN WITH MINERALS) TABS tablet Take 1 tablet by mouth daily.    Marland Kitchen omega-3 acid ethyl esters (LOVAZA) 1 g capsule Take 1 capsule (1 g total) by mouth 2 (two) times daily. 60 capsule 0  . PROAIR HFA 108 (90 Base) MCG/ACT inhaler INHALE 2 PUFFS INTO THE LUNGS EVERY 4 HOURS AS NEEDED FOR WHEEZING ORSHORTNESS OF BREATH. (Patient taking differently: Inhale 1-2 puffs into the lungs every 6 (six) hours as needed for wheezing or shortness of breath. ) 8.5 g 0  . topiramate (TOPAMAX) 100 MG tablet Take 100 mg by mouth 2 (two) times daily.   2   No current facility-administered medications for this visit.     Past Surgical History:  Procedure  Laterality Date  . ABDOMINAL HYSTERECTOMY     partial  . APPENDECTOMY    . BUNIONECTOMY Left   . CARPAL TUNNEL RELEASE Right 01/13/2013   Procedure: CARPAL TUNNEL RELEASE;  Surgeon: Cammie Sickle., MD;  Location: [facility];  Service: Orthopedics;  Laterality: Right;  . CERVICAL FUSION     x 2  . DIAGNOSTIC LAPAROSCOPY  11/08/2006  . INGUINAL HERNIA REPAIR Right   . LAPAROSCOPIC APPENDECTOMY  11/08/2006  . LEFT HEART CATH AND CORONARY ANGIOGRAPHY N/A 08/08/2016   Procedure: Left Heart Cath and Coronary Angiography;  Surgeon: Wellington Hampshire, MD;  Location: [facility];  Service: Cardiovascular;  Laterality: N/A;  . LUMBAR FUSION  06/28/2014   L2-5  . LUMBAR LAMINECTOMY  02/05/2007   L4-5 fusion  . TUBAL LIGATION       Allergies  Allergen Reactions  . Apple Anaphylaxis and Shortness Of Breath  . Penicillins Anaphylaxis, Shortness Of Breath, Itching and Swelling    Has patient had a PCN reaction causing immediate rash, facial/tongue/throat swelling, SOB or lightheadedness with hypotension: Unknown, childhood reaction Has patient had a PCN reaction causing severe rash involving mucus membranes or skin necrosis: No Has patient had a PCN reaction that required hospitalization No Has patient had a PCN reaction occurring within the last 10 years: No If all of the above answers are "NO", then may proceed with Cephalosporin use.   . Bee Venom Swelling    Takes benadryl to help with reaction      Family History  Problem Relation Age of Onset  . Lung cancer Father   . Diabetes Maternal Grandmother   . Heart attack Maternal Grandmother   . Deep vein thrombosis Neg Hx      Social History Ms. Heindel reports that she has been smoking cigarettes. She has a 20.00 pack-year smoking history. She has never used smokeless tobacco. Ms. Sedberry reports no history of alcohol use.   Review of Systems CONSTITUTIONAL: No weight loss, fever, chills, weakness or fatigue.   HEENT: Eyes: No visual loss, blurred vision, double vision or yellow sclerae.No hearing loss, sneezing, congestion, runny nose or sore throat.  SKIN: No rash or itching.  CARDIOVASCULAR: per hpi RESPIRATORY:per hpi GASTROINTESTINAL: No anorexia, nausea, vomiting or diarrhea. No abdominal pain or blood.  GENITOURINARY: No burning on urination, no polyuria NEUROLOGICAL: No headache, dizziness, syncope, paralysis, ataxia, numbness or tingling in the extremities. No change in bowel or bladder control.  MUSCULOSKELETAL: No muscle, back pain, joint pain or stiffness.  LYMPHATICS: No enlarged nodes. No history of splenectomy.  PSYCHIATRIC: No history of depression or anxiety.  ENDOCRINOLOGIC:  No reports of sweating, cold or heat intolerance. No polyuria or polydipsia.  Marland Kitchen   Physical Examination Today's Vitals   06/18/19 0917  BP: 107/67  Pulse: 83  SpO2: 95%  Weight: 158 lb (71.7 kg)  Height: 5\' 2"  (1.575 m)   Body mass index is 28.9 kg/m.  Gen: resting comfortably, no acute distress HEENT: no scleral icterus, pupils equal round and reactive, no palptable cervical adenopathy,  CV: RRR, no m/r/g no jvd Resp: mild bilateral wheezing GI: abdomen is soft, non-tender, non-distended, normal bowel sounds, no hepatosplenomegaly MSK: extremities are warm, no edema.  Skin: warm, no rash Neuro:  no focal deficits Psych: appropriate affect   Diagnostic Studies  Echo 08/06/2016 LV EF: 40% - 45%  Study Conclusions  - Left ventricle: The cavity size was normal. Wall thickness was normal. Systolic function was mildly to moderately reduced. The estimated ejection fraction was in the range of 40% to 45%. Diffuse hypokinesis. - Mitral valve: There was mild regurgitation. - Pulmonary arteries: Systolic pressure was mildly increased. PA peak pressure: 31 mm Hg (S).     Cath 08/08/2016 Conclusion     Mid RCA lesion, 10 %stenosed.  LV end diastolic pressure is  normal.  There is mild to moderate left ventricular systolic dysfunction.  There is no mitral valve regurgitation.  1. Near normal coronary arteries with no evidence of obstructive disease. 2. Mild to moderately reduced LV systolic function with an ejection fraction of 40% with global hypokinesis. 3. Normal left ventricular end-diastolic pressure.  Recommendations: Continue medical therapy for nonischemic cardiomyopathy. The patient does not require further intravenous diuresis and I switched her to small dose oral furosemide. Right heart catheterization was planned. However, it could not be done via the right antecubital area due to cellulitis and the wire could not be advanced via the left antecubital vein. Given that her LVEDP was normal, I elected not to pursue a right heart catheterization. There was no evidence of pulmonary hypertension on echo.      Assessment and Plan   1. History of NICM - prior mild LV dysfunction, from f/u echo LVEF improved to low normal - some ongoing SOB, unclear if related to her COPD or recurrent LV dysfunction - repeat echo - if recurrent dysfunction would need to restart beta blocker  2. Chest pain - chronic atypical chest pain, current symptoms lasting up to 24 hours, similar to symptoms she had 3 years ago when she had an essentially normal cath - continue to monitor, no plans for repeat ischemic testing at this time - EKG today SR, no acute ischemic changes  F/u 6 months     Arnoldo Lenis, M.D.

## 2019-06-18 NOTE — Patient Instructions (Signed)
Your physician wants you to follow-up in: [place] will receive a reminder letter in the mail two months in advance. If you don't receive a letter, please call our office to schedule the follow-up appointment.  Your physician has recommended you make the following change in your medication:   DECREASE ASPIRIN 25 MG DAILY   Your physician has requested that you have an echocardiogram. Echocardiography is a painless test that uses sound waves to create images of your heart. It provides your doctor with information about the size and shape of your heart and how well your heart's chambers and valves are working. This procedure takes approximately one hour. There are no restrictions for this procedure.  Thank you for choosing [facility]!!

## 2019-07-08 ENCOUNTER — Ambulatory Visit (INDEPENDENT_AMBULATORY_CARE_PROVIDER_SITE_OTHER): Payer: Medicare HMO

## 2019-07-08 ENCOUNTER — Other Ambulatory Visit: Payer: Self-pay

## 2019-07-08 DIAGNOSIS — R0602 Shortness of breath: Secondary | ICD-10-CM | POA: Diagnosis not present

## 2019-07-15 ENCOUNTER — Telehealth: Payer: Self-pay | Admitting: *Deleted

## 2019-07-15 NOTE — Telephone Encounter (Signed)
-----   Message from Arnoldo Lenis, MD sent at 07/14/2019 12:35 PM EST ----- Echo overall looks good, normal heart function   Zandra Abts MD

## 2019-07-15 NOTE — Telephone Encounter (Signed)
Pt voiced understanding - routed to pcp  

## 2019-09-11 ENCOUNTER — Telehealth: Payer: Self-pay

## 2019-09-11 MED ORDER — NITROGLYCERIN 0.4 MG SL SUBL: SUBLINGUAL_TABLET | SUBLINGUAL | 0 refills | Status: DC

## 2019-09-11 NOTE — Telephone Encounter (Signed)
Not on NTG, will clear with Dr.Branch to write rx for.

## 2019-09-11 NOTE — Telephone Encounter (Signed)
I left message for pt that rx for NTG will be sent to pharmacy with instructions for her to sit down before taking.

## 2019-09-11 NOTE — Telephone Encounter (Signed)
Has not had a history of significant coronary disease, but sometimes can have some spasm of the arteries that can cause chest pain that NG can help with. Im ok giving a try, make sure she knows needs to sit down before trying NG at home. Can give Rx for SL NG prn  Zandra Abts MD

## 2019-09-11 NOTE — Telephone Encounter (Signed)
Per Pt she is having CP from time to time. Please call in Nitro to [employer]. Pt scheduled to see Quinn,NP on 09-17-19. Offered Pt earlier appt. However she is unable to get transportation.   Pt [phone]   Thanks renee

## 2019-09-16 NOTE — Progress Notes (Deleted)
Cardiology Office Note  Date: 09/17/2019   ID: Darlene Wilcox, DOB September 22, 1960, MRN [id number]  PCP:  [facility], [facility] Not In  Cardiologist:  Carlyle Dolly, MD Electrophysiologist:  None   Chief Complaint: F/U NICM, CP, HLD  History of Present Illness: Darlene Wilcox is a 59 y.o. female with a history of nonischemic cardiomyopathy, chest pain, hyperlipidemia, COPD.  NICM improved from March 2018 to September 2018 with original EF of 40 to 45% in March and improved to 50 to 55% and September of that year.  Cardiac catheterization in March 2018 showed 10% RCA, normal LVEDP, EF 40, no significant coronary artery disease seen.  Medical therapy was limited by a soft BPs.  She had some chronic SOB at the time but had run out of her inhalers.  She has COPD with a long history of smoking.  She complained of some left-sided stabbing chest pain which occurred at any time and could radiate to left arm.  Could last up to 24 hours.  It was not positional.  Repeat echo July 08, 2019 showed EF 50 to 55%, mild MR.     Past Medical History:  Diagnosis Date  . Anxiety   . Asthma    daily inhaler  . Bipolar disorder ([place])   . Bronchitis due to tobacco use 07/2016  . 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 CHF (congestive heart failure) ([place])   . Chronic back pain greater than 3 months duration   . Depression   . Full dentures   . GERD (gastroesophageal reflux disease)   . Headache(784.0)    1-2 x/month  . History of ankle sprain    right; 2 weeks ago;  c/o severe pain  . Seizures ([place])    states "silent seizures"; is on anticonvulsant; none in 2-3 mos.    Past Surgical History:  Procedure Laterality Date  . ABDOMINAL HYSTERECTOMY     partial  . APPENDECTOMY    . BUNIONECTOMY Left   . CARPAL TUNNEL RELEASE Right 01/13/2013   Procedure: CARPAL TUNNEL RELEASE;  Surgeon: Cammie Sickle., MD;  Location: [facility];  Service: Orthopedics;  Laterality:  Right;  . CERVICAL FUSION     x 2  . DIAGNOSTIC LAPAROSCOPY  11/08/2006  . INGUINAL HERNIA REPAIR Right   . LAPAROSCOPIC APPENDECTOMY  11/08/2006  . LEFT HEART CATH AND CORONARY ANGIOGRAPHY N/A 08/08/2016   Procedure: Left Heart Cath and Coronary Angiography;  Surgeon: Wellington Hampshire, MD;  Location: [facility];  Service: Cardiovascular;  Laterality: N/A;  . LUMBAR FUSION  06/28/2014   L2-5  . LUMBAR LAMINECTOMY  02/05/2007   L4-5 fusion  . TUBAL LIGATION      Current Outpatient Medications  Medication Sig Dispense Refill  . ASMANEX HFA 200 MCG/ACT AERO Inhale 2 puffs into the lungs 2 (two) times daily.    Marland Kitchen aspirin EC 81 MG tablet Take 81 mg by mouth daily.    . clonazePAM (KLONOPIN) 1 MG tablet Take 1 mg by mouth 3 (three) times daily.    . cyclobenzaprine (FLEXERIL) 10 MG tablet Take 1 tablet (10 mg total) by mouth 3 (three) times daily. (Patient taking differently: Take 10 mg by mouth 3 (three) times daily as needed. ) 20 tablet 0  . diphenhydrAMINE (BENADRYL) 25 MG tablet Take 1 tablet (25 mg total) by mouth every 6 (six) hours as needed for itching. 30 tablet 0  . DULoxetine (CYMBALTA) 30 MG  capsule Take 1 capsule (30 mg total) by mouth daily. 30 capsule 0  . esomeprazole (NEXIUM) 40 MG capsule Take 40 mg by mouth daily at 12 noon.    . lamoTRIgine (LAMICTAL) 25 MG tablet Take 1 tablet (25 mg total) by mouth daily. 30 tablet 0  . losartan (COZAAR) 25 MG tablet Take 25 mg by mouth daily.    Marland Kitchen lurasidone (LATUDA) 20 MG TABS tablet Take 1 tablet (20 mg total) by mouth daily with breakfast. 30 tablet 0  . mirabegron ER (MYRBETRIQ) 25 MG TB24 tablet Take 25 mg by mouth daily.     . Multiple Vitamin (MULTIVITAMIN WITH MINERALS) TABS tablet Take 1 tablet by mouth daily.    . nitroGLYCERIN (NITROSTAT) 0.4 MG SL tablet Sit down before taking medication. Take 1 tablet (0.4 mg) under the tongue for chest pain.May repeat every 5 minutes for a total of 3 tablets 25 tablet 0  . omega-3 acid  ethyl esters (LOVAZA) 1 g capsule Take 1 capsule (1 g total) by mouth 2 (two) times daily. 60 capsule 0  . PROAIR HFA 108 (90 Base) MCG/ACT inhaler INHALE 2 PUFFS INTO THE LUNGS EVERY 4 HOURS AS NEEDED FOR WHEEZING ORSHORTNESS OF BREATH. (Patient taking differently: Inhale 1-2 puffs into the lungs every 6 (six) hours as needed for wheezing or shortness of breath. ) 8.5 g 0  . topiramate (TOPAMAX) 100 MG tablet Take 100 mg by mouth 2 (two) times daily.   2   No current facility-administered medications for this visit.   Allergies:  Apple, Penicillins, and Bee venom   Social History: The patient  reports that she has been smoking cigarettes. She has a 20.00 pack-year smoking history. She has never used smokeless tobacco. She reports current drug use. Drug: Marijuana. She reports that she does not drink alcohol.   Family History: The patient's family history includes Diabetes in her maternal grandmother; Heart attack in her maternal grandmother; Lung cancer in her father.   ROS:  Please see the history of present illness. Otherwise, complete review of systems is positive for none.  All other systems are reviewed and negative.   Physical Exam: VS:  There were no vitals taken for this visit., BMI There is no height or weight on file to calculate BMI.  Wt Readings from Last 3 Encounters:  06/18/19 158 lb (71.7 kg)  12/21/18 158 lb (71.7 kg)  12/01/18 163 lb (73.9 kg)    General: Patient appears comfortable at rest. HEENT: Conjunctiva and lids normal, oropharynx clear with moist mucosa. Neck: Supple, no elevated JVP or carotid bruits, no thyromegaly. Lungs: Clear to auscultation, nonlabored breathing at rest. Cardiac: Regular rate and rhythm, no S3 or significant systolic murmur, no pericardial rub. Abdomen: Soft, nontender, no hepatomegaly, bowel sounds present, no guarding or rebound. Extremities: No pitting edema, distal pulses 2+. Skin: Warm and dry. Musculoskeletal: No  kyphosis. Neuropsychiatric: Alert and oriented x3, affect grossly appropriate.  ECG:  {EKG/Telemetry Strips Reviewed:[id number]}  Recent Labwork: 12/01/2018: ALT 17; AST 19; BUN 12; Creatinine, Ser 0.61; Hemoglobin 13.9; Platelets 203; Potassium 3.6; Sodium 141 12/03/2018: TSH 1.880     Component Value Date/Time   CHOL 229 (H) 12/03/2018 1809   CHOL 220 (H) 11/03/2013 1519   TRIG 466 (H) 12/03/2018 1809   TRIG 359 (H) 07/30/2014 0910   HDL 25 (L) 12/03/2018 1809   HDL 33 (L) 07/30/2014 0910   CHOLHDL 9.2 12/03/2018 1809   VLDL UNABLE TO CALCULATE IF TRIGLYCERIDE OVER 400 mg/dL  12/03/2018 1809   LDLCALC UNABLE TO CALCULATE IF TRIGLYCERIDE OVER 400 mg/dL 12/03/2018 1809   LDLCALC Comment 11/03/2013 1519   LDLCALC 69 03/30/2013 1219   LDLDIRECT 115.5 (H) 12/03/2018 1809    Other Studies Reviewed Today:  Echocardiogram 07/08/2019  1. Left ventricular ejection fraction, by estimation, is 50 to 55%. The left ventricle has low normal function. The left ventrical has no regional wall motion abnormalities. Left ventricular diastolic parameters were normal. 2. Right ventricular systolic function is normal. The right ventricular size is normal. There is normal pulmonary artery systolic pressure. The estimated right ventricular systolic pressure is [id number] mmHg. 3. Left atrial size was upper normal. 4. The mitral valve is grossly normal. Mild mitral valve regurgitation. 5. The aortic valve is tricuspid. Aortic valve regurgitation is not visualized. 6. The inferior vena cava is normal in size with greater than 50% respiratory variability, suggesting right atrial pressure of 3 mmHg.   Echo 08/06/2016 LV EF: 40% - 45%  Study Conclusions  - Left ventricle: The cavity size was normal. Wall thickness was normal. Systolic function was mildly to moderately reduced. The estimated ejection fraction was in the range of 40% to 45%. Diffuse hypokinesis. - Mitral valve: There was mild  regurgitation. - Pulmonary arteries: Systolic pressure was mildly increased. PA peak pressure: 31 mm Hg (S).     Cath 08/08/2016 Conclusion     Mid RCA lesion, 10 %stenosed.  LV end diastolic pressure is normal.  There is mild to moderate left ventricular systolic dysfunction.  There is no mitral valve regurgitation.  1. Near normal coronary arteries with no evidence of obstructive disease. 2. Mild to moderately reduced LV systolic function with an ejection fraction of 40% with global hypokinesis. 3. Normal left ventricular end-diastolic pressure.  Recommendations: Continue medical therapy for nonischemic cardiomyopathy. The patient does not require further intravenous diuresis and I switched her to small dose oral furosemide. Right heart catheterization was planned. However, it could not be done via the right antecubital area due to cellulitis and the wire could not be advanced via the left antecubital vein. Given that her LVEDP was normal, I elected not to pursue a right heart catheterization. There was no evidence of pulmonary hypertension on echo.    Assessment and Plan:  1. NICM (nonischemic cardiomyopathy) ([place])   2. SOB (shortness of breath)   3. Other chest pain    1. NICM (nonischemic cardiomyopathy) ([facility]) ***  2. SOB (shortness of breath) ***  3. Other chest pain ***  Medication Adjustments/Labs and Tests Ordered: Current medicines are reviewed at length with the patient today.  Concerns regarding medicines are outlined above.   Disposition: Follow-up with Dr. Harl Bowie or APP  Signed, Levell July, NP 09/17/2019 10:13 AM    [facility] [place], [place], [place] [address] Phone: [phone]; Fax: [phone]

## 2019-09-17 ENCOUNTER — Ambulatory Visit: Payer: Medicare HMO | Admitting: Family Medicine

## 2019-10-06 ENCOUNTER — Other Ambulatory Visit: Payer: Self-pay | Admitting: Cardiology

## 2019-10-09 ENCOUNTER — Emergency Department (HOSPITAL_COMMUNITY)
Admission: EM | Admit: 2019-10-09 | Discharge: 2019-10-09 | Disposition: A | Discharge status: Home / Self Care | Payer: Medicare Other | Attending: Emergency Medicine | Admitting: Emergency Medicine

## 2019-10-09 ENCOUNTER — Emergency Department (HOSPITAL_COMMUNITY): Payer: Medicare Other

## 2019-10-09 ENCOUNTER — Encounter (HOSPITAL_COMMUNITY): Payer: Self-pay | Admitting: *Deleted

## 2019-10-09 DIAGNOSIS — S81811A Laceration without foreign body, right lower leg, initial encounter: Secondary | ICD-10-CM | POA: Insufficient documentation

## 2019-10-09 DIAGNOSIS — W540XXA Bitten by dog, initial encounter: Secondary | ICD-10-CM | POA: Insufficient documentation

## 2019-10-09 DIAGNOSIS — J45909 Unspecified asthma, uncomplicated: Secondary | ICD-10-CM | POA: Insufficient documentation

## 2019-10-09 DIAGNOSIS — Y939 Activity, unspecified: Secondary | ICD-10-CM | POA: Diagnosis not present

## 2019-10-09 DIAGNOSIS — Z2914 Encounter for prophylactic rabies immune globin: Secondary | ICD-10-CM | POA: Diagnosis not present

## 2019-10-09 DIAGNOSIS — Z23 Encounter for immunization: Secondary | ICD-10-CM | POA: Insufficient documentation

## 2019-10-09 DIAGNOSIS — F17210 Nicotine dependence, cigarettes, uncomplicated: Secondary | ICD-10-CM | POA: Insufficient documentation

## 2019-10-09 DIAGNOSIS — Z7982 Long term (current) use of aspirin: Secondary | ICD-10-CM | POA: Insufficient documentation

## 2019-10-09 DIAGNOSIS — Y92007 Garden or yard of unspecified non-institutional (private) residence as the place of occurrence of the external cause: Secondary | ICD-10-CM | POA: Diagnosis not present

## 2019-10-09 DIAGNOSIS — Y999 Unspecified external cause status: Secondary | ICD-10-CM | POA: Insufficient documentation

## 2019-10-09 DIAGNOSIS — I5022 Chronic systolic (congestive) heart failure: Secondary | ICD-10-CM | POA: Diagnosis not present

## 2019-10-09 DIAGNOSIS — Z79899 Other long term (current) drug therapy: Secondary | ICD-10-CM | POA: Diagnosis not present

## 2019-10-09 MED ORDER — IBUPROFEN 600 MG PO TABS: 600.0000 mg | ORAL_TABLET | Freq: Four times a day (QID) | ORAL | 0 refills | Status: AC | PRN

## 2019-10-09 MED ORDER — DOXYCYCLINE HYCLATE 100 MG PO CAPS: 100.0000 mg | ORAL_CAPSULE | Freq: Two times a day (BID) | ORAL | 0 refills | Status: DC

## 2019-10-09 MED ORDER — HYDROCODONE-ACETAMINOPHEN 5-325 MG PO TABS
1.0000 | ORAL_TABLET | Freq: Once | ORAL | Status: AC
  Administered 2019-10-09: 1 via ORAL
  Filled 2019-10-09: qty 1

## 2019-10-09 MED ORDER — LIDOCAINE-EPINEPHRINE (PF) 2 %-1:200000 IJ SOLN
20.0000 mL | Freq: Once | INTRAMUSCULAR | Status: AC
  Administered 2019-10-09: 20 mL
  Filled 2019-10-09: qty 20

## 2019-10-09 MED ORDER — TETANUS-DIPHTH-ACELL PERTUSSIS 5-2.5-18.5 LF-MCG/0.5 IM SUSP
0.5000 mL | Freq: Once | INTRAMUSCULAR | Status: AC
  Administered 2019-10-09: 0.5 mL via INTRAMUSCULAR
  Filled 2019-10-09: qty 0.5

## 2019-10-09 MED ORDER — ACETAMINOPHEN 500 MG PO TABS: 1000.0000 mg | ORAL_TABLET | Freq: Three times a day (TID) | ORAL | 0 refills | Status: DC | PRN

## 2019-10-09 MED ORDER — RABIES VACCINE, PCEC IM SUSR
1.0000 mL | Freq: Once | INTRAMUSCULAR | Status: AC
  Administered 2019-10-09: 1 mL via INTRAMUSCULAR
  Filled 2019-10-09: qty 1

## 2019-10-09 MED ORDER — DOXYCYCLINE HYCLATE 100 MG PO TABS
100.0000 mg | ORAL_TABLET | Freq: Once | ORAL | Status: AC
  Administered 2019-10-09: 100 mg via ORAL
  Filled 2019-10-09: qty 1

## 2019-10-09 MED ORDER — RABIES IMMUNE GLOBULIN 150 UNIT/ML IM INJ
20.0000 [IU]/kg | INJECTION | Freq: Once | INTRAMUSCULAR | Status: AC
  Administered 2019-10-09: 1425 [IU] via INTRAMUSCULAR
  Filled 2019-10-09: qty 10

## 2019-10-09 MED ORDER — ONDANSETRON 4 MG PO TBDP
4.0000 mg | ORAL_TABLET | Freq: Once | ORAL | Status: AC
  Administered 2019-10-09: 4 mg via ORAL
  Filled 2019-10-09: qty 1

## 2019-10-09 NOTE — ED Notes (Signed)
Spoke with Pilar Plate at c comm, states report has already been taken and animal is to quarantined at home. If she notices animal outside without a leash she can call animal control

## 2019-10-09 NOTE — ED Provider Notes (Signed)
[facility] Provider Note   CSN: [id number] Arrival date & time: 10/09/19  1415     History Chief Complaint  Patient presents with  . Animal Bite    Darlene Wilcox is a 59 y.o. female.  Darlene Wilcox is a 59 y.o. female with a history of seizures, asthma, chronic back pain, bipolar disorder, CHF, who presents to the emergency department for evaluation of dog bite.  Patient states that she went over to ask her neighbors that they could go home on and their pitbull got out from the backyard and hit the back of her right ankle.  She reports this is the second time that she has been bitten by this dog.  She states that the owners were unable to verify that the dog is up-to-date on rabies vaccination.  She reports when she was bit previously she did not seek medical evaluation.  Animal control was contacted and report was made.  Patient reports the dog was placed on quarantine.  She has puncture wounds to the medial aspect of the ankle and the larger laceration to the lateral aspect of the right ankle.  No puncture wounds or bites elsewhere on the body.  Reports bleeding is controlled.  She has never before received rabies vaccination, is unsure when her last tetanus vaccination was.  Reports persistent pain over the back of the ankle that is worse with weightbearing but she is able to flex and move her ankle.  Denies numbness or weakness.  No other aggravating or alleviating factors.        Past Medical History:  Diagnosis Date  . Anxiety   . Asthma    daily inhaler  . Bipolar disorder ([place])   . Bronchitis due to tobacco use 07/2016  . 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 CHF (congestive heart failure) ([place])   . Chronic back pain greater than 3 months duration   . Depression   . Full dentures   . GERD (gastroesophageal reflux disease)   . Headache(784.0)    1-2 x/month  . History of ankle sprain    right; 2 weeks ago;  c/o severe pain  .  Seizures ([place])    states "silent seizures"; is on anticonvulsant; none in 2-3 mos.    Patient Active Problem List   Diagnosis Date Noted  . MDD (major depressive disorder), recurrent severe, without psychosis ([place]) 12/02/2018  . Cocaine abuse ([place])   . Anemia   . Hypotension 01/31/2017  . AKI (acute kidney injury) ([place])   . Sepsis ([place])   . Slurred speech   . Acute hypoxemic respiratory failure ([place]) 08/12/2016  . Superficial thrombophlebitis 08/12/2016  . Acute congestive heart failure ([place]) 08/07/2016  . Acute systolic CHF (congestive heart failure) ([place])   . Chest pain 08/06/2016  . Prolonged QT interval 08/06/2016  . Volume overload 08/05/2016  . Contusion of hip 05/08/2016  . Asthma, chronic 08/07/2014  . Seizures ([place]) 02/23/2014  . Bipolar II disorder ([place]) 11/13/2006  . DISORDER, TOBACCO USE 11/13/2006  . GERD 11/13/2006  . BACK PAIN, CHRONIC 11/13/2006  . HX, PERSONAL, ALCOHOLISM 11/13/2006    Past Surgical History:  Procedure Laterality Date  . ABDOMINAL HYSTERECTOMY     partial  . APPENDECTOMY    . BUNIONECTOMY Left   . CARPAL TUNNEL RELEASE Right 01/13/2013   Procedure: CARPAL TUNNEL RELEASE;  Surgeon: Cammie Sickle., MD;  Location: [facility];  Service: Orthopedics;  Laterality:  Right;  . CERVICAL FUSION     x 2  . DIAGNOSTIC LAPAROSCOPY  11/08/2006  . INGUINAL HERNIA REPAIR Right   . LAPAROSCOPIC APPENDECTOMY  11/08/2006  . LEFT HEART CATH AND CORONARY ANGIOGRAPHY N/A 08/08/2016   Procedure: Left Heart Cath and Coronary Angiography;  Surgeon: Wellington Hampshire, MD;  Location: [facility];  Service: Cardiovascular;  Laterality: N/A;  . LUMBAR FUSION  06/28/2014   L2-5  . LUMBAR LAMINECTOMY  02/05/2007   L4-5 fusion  . TUBAL LIGATION       OB History    Gravida  2   Para  2   Term  2   Preterm      AB      Living        SAB      TAB      Ectopic      Multiple      Live Births              Family History    Problem Relation Age of Onset  . Lung cancer Father   . Diabetes Maternal Grandmother   . Heart attack Maternal Grandmother   . Deep vein thrombosis Neg Hx     Social History   Tobacco Use  . Smoking status: Current Every Day Smoker    Packs/day: 1.00    Years: 20.00    Pack years: 20.00    Types: Cigarettes  . Smokeless tobacco: Never Used  . Tobacco comment:  patient states smokes only 5 cigarettes per day  Substance Use Topics  . Alcohol use: No    Alcohol/week: 0.0 standard drinks  . Drug use: Yes    Types: Marijuana    Comment: 2 weeks ago    Home Medications Prior to Admission medications   Medication Sig Start Date End Date Taking? Authorizing Provider  ASMANEX HFA 200 MCG/ACT AERO Inhale 2 puffs into the lungs 2 (two) times daily. 07/07/18   [provider]  ASMANEX, 60 METERED DOSES, 220 MCG/INH inhaler Inhale 1 puff into the lungs 2 (two) times daily. 09/21/19   [provider]  aspirin EC 81 MG tablet Take 81 mg by mouth daily.    [provider]  benztropine (COGENTIN) 1 MG tablet Take 1 mg by mouth daily. 09/10/19   [provider]  clonazePAM (KLONOPIN) 1 MG tablet Take 1 mg by mouth 3 (three) times daily.    [provider]  cyclobenzaprine (FLEXERIL) 10 MG tablet Take 1 tablet (10 mg total) by mouth 3 (three) times daily. Patient taking differently: Take 10 mg by mouth 3 (three) times daily as needed.  11/27/18   Lily Kocher, PA-C  diphenhydrAMINE (BENADRYL) 25 MG tablet Take 1 tablet (25 mg total) by mouth every 6 (six) hours as needed for itching. 12/21/18   Langston Masker B, PA-C  DULoxetine (CYMBALTA) 30 MG capsule Take 1 capsule (30 mg total) by mouth daily. 12/07/18   Starkes-Perry, Gayland Curry, FNP  DULoxetine (CYMBALTA) 60 MG capsule Take 120 mg by mouth daily. 09/10/19   [provider]  esomeprazole (NEXIUM) 40 MG capsule Take 40 mg by mouth daily at 12 noon.    [provider]  INGREZZA 80 MG  CAPS Take 1 capsule by mouth daily. 09/09/19   [provider]  lamoTRIgine (LAMICTAL) 25 MG tablet Take 1 tablet (25 mg total) by mouth daily. 12/07/18   Suella Broad, FNP  losartan (COZAAR) 25 MG tablet  Take 25 mg by mouth daily.    [provider]  lurasidone (LATUDA) 20 MG TABS tablet Take 1 tablet (20 mg total) by mouth daily with breakfast. 12/07/18   Starkes-Perry, Gayland Curry, FNP  meloxicam (MOBIC) 15 MG tablet Take 15 mg by mouth daily. 09/11/19   [provider]  Multiple Vitamin (MULTIVITAMIN WITH MINERALS) TABS tablet Take 1 tablet by mouth daily.    [provider]  MYRBETRIQ 50 MG TB24 tablet Take 50 mg by mouth daily. 08/25/19   [provider]  nitroGLYCERIN (NITROSTAT) 0.4 MG SL tablet PLACE 1 TAB UNDER TONGUE EVERY 3 MINUTES AS DIRECTED UP TO 3 TIMES ASNEEDED FOR CHEST PAIN. 10/06/19   Arnoldo Lenis, MD  omega-3 acid ethyl esters (LOVAZA) 1 g capsule Take 1 capsule (1 g total) by mouth 2 (two) times daily. 12/06/18   Starkes-Perry, Gayland Curry, FNP  oxcarbazepine (TRILEPTAL) 600 MG tablet Take 600 mg by mouth 2 (two) times daily. 10/06/19   [provider]  PROAIR HFA 108 (90 Base) MCG/ACT inhaler INHALE 2 PUFFS INTO THE LUNGS EVERY 4 HOURS AS NEEDED FOR WHEEZING ORSHORTNESS OF BREATH. Patient taking differently: Inhale 1-2 puffs into the lungs every 6 (six) hours as needed for wheezing or shortness of breath.  09/19/16   Claretta Fraise, MD  QUEtiapine (SEROQUEL) 50 MG tablet Take 50 mg by mouth at bedtime. 10/02/19   [provider]  topiramate (TOPAMAX) 100 MG tablet Take 100 mg by mouth 2 (two) times daily.  03/31/14   Challa, Wallace Cullens, MD  topiramate (TOPAMAX) 200 MG tablet Take 200 mg by mouth at bedtime. 09/10/19   [provider]  traZODone (DESYREL) 100 MG tablet Take 100 mg by mouth at bedtime. 09/05/19   [provider]    Allergies    Apple, Penicillins, and Bee venom  Review of Systems    Review of Systems  Constitutional: Negative for chills and fever.  Musculoskeletal: Positive for arthralgias and myalgias.  Skin: Positive for wound.  Neurological: Negative for weakness and numbness.  All other systems reviewed and are negative.   Physical Exam Updated Vital Signs BP (!) 159/72 (BP Location: Right Arm)   Pulse 70   Temp 98.3 F (36.8 C) (Oral)   Resp 17   Ht 5\' 2"  (1.575 m)   Wt 70.8 kg   SpO2 97%   BMI 28.53 kg/m   Physical Exam Vitals and nursing note reviewed.  Constitutional:      General: She is not in acute distress.    Appearance: She is well-developed. She is not diaphoretic.     Comments: Pt is tearful and very upset about dog bite  HENT:     Head: Normocephalic and atraumatic.  Eyes:     General:        Right eye: No discharge.        Left eye: No discharge.  Pulmonary:     Effort: Pulmonary effort is normal. No respiratory distress.  Musculoskeletal:     Comments: 2 small puncture wounds noted to the medial aspect of the right ankle, larger than 5 cm U-shaped laceration noted to the lateral aspect of the ankle.  No other lacerations or wounds noted from dog bite.  Larger laceration does not involve the Achilles tendon.  Patient is able to flex and extend the foot without difficulty.  Distal pulses intact.  Normal sensation.  Skin:    General: Skin is warm and dry.  Neurological:  Mental Status: She is alert and oriented to person, place, and time.     Coordination: Coordination normal.  Psychiatric:        Mood and Affect: Affect is labile and tearful.     ED Results / Procedures / Treatments   Labs (all labs ordered are listed, but only abnormal results are displayed) Labs Reviewed - No data to display  EKG None  Radiology DG Ankle Complete Right  Result Date: 10/09/2019 CLINICAL DATA:  Dog bite, posterior right ankle EXAM: RIGHT ANKLE - COMPLETE 3+ VIEW COMPARISON:  Radiograph 09/20/2003 FINDINGS: Mild edematous changes of  the lower extremity with more focal soft tissue thickening along the posterior and lateral ankle. Several foci of gas projecting over the stranding in Kager's triangle though these may be within the lateral soft tissues on the oblique and lateral radiographs. Moderate ankle joint effusion is present as well. Evidence of remote posttraumatic change about the ankle with numerous corticated fragments adjacent the tip of the medial and lateral malleoli as well as along the posterior joint near the posterior process talus. No definite acute osseous injury is seen. Bidirectional calcaneal spurs. Mid and hindfoot alignment is grossly normal on these nonweightbearing, non dedicated radiographs. IMPRESSION: 1. No definite acute osseous abnormality. 2. Soft tissue thickening and gas along the posterior and lateral ankle may be related to reported dog bite. 3. Moderate joint effusion. 4. Several foci of gas projecting over the stranding in Kager's triangle lateral radiograph though possibly corresponding to foci within the more superficial lateral soft tissues on additional views. 5. Bidirectional calcaneal spurs. 6. Extensive remote posttraumatic changes of the ankle. Electronically Signed   By: Lovena Le M.D.   On: 10/09/2019 16:35    Procedures .Marland KitchenLaceration Repair  Date/Time: 10/10/2019 12:50 AM Performed by: Jacqlyn Larsen, PA-C Authorized by: Jacqlyn Larsen, PA-C   Consent:    Consent obtained:  Verbal   Consent given by:  Patient   Risks discussed:  Infection, pain, poor cosmetic result, poor wound healing and need for additional repair   Alternatives discussed:  No treatment Anesthesia (see MAR for exact dosages):    Anesthesia method:  Local infiltration   Local anesthetic:  Lidocaine 2% WITH epi Laceration details:    Location:  Leg   Leg location:  R lower leg   Length (cm):  5   Depth (mm):  5 Repair type:    Repair type:  Intermediate Pre-procedure details:    Preparation:  Patient was  prepped and draped in usual sterile fashion and imaging obtained to evaluate for foreign bodies Exploration:    Hemostasis achieved with:  Direct pressure and epinephrine   Wound exploration: wound explored through full range of motion and entire depth of wound probed and visualized     Wound extent: areolar tissue violated     Wound extent: no foreign bodies/material noted, no tendon damage noted and no underlying fracture noted     Contaminated: yes (Dog bite)   Treatment:    Area cleansed with:  Saline   Amount of cleaning:  Extensive   Irrigation solution:  Sterile saline   Irrigation volume:  1 L   Irrigation method:  Pressure wash Skin repair:    Repair method:  Sutures   Suture size:  4-0   Suture material:  Prolene   Suture technique:  Simple interrupted   Number of sutures:  7 Approximation:    Approximation:  Loose Post-procedure details:    Dressing:  Bulky dressing   Patient tolerance of procedure:  Tolerated well, no immediate complications   (including critical care time)  Medications Ordered in ED Medications  rabies immune globulin (HYPERAB/KEDRAB) injection 1,425 Units (has no administration in time range)  rabies vaccine (RABAVERT) injection 1 mL (has no administration in time range)  Tdap (BOOSTRIX) injection 0.5 mL (has no administration in time range)    ED Course  I have reviewed the triage vital signs and the nursing notes.  Pertinent labs & imaging results that were available during my care of the patient were reviewed by me and considered in my medical decision making (see chart for details).    MDM Rules/Calculators/A&P                     59 year old female presents after dog bite to the right ankle.  Dog has been placed in quarantine, but was not able to verify rabies vaccination.  I had a long discussion with patient about rabies vaccination and quarantine process.  She would like to receive rabies vaccination and immunoglobulin today.  This is  the second time she has been bit by this dog.  She is tearful and very upset about the situation.  The right ankle is neurovascularly intact with 2 small medial puncture wounds and one larger lateral laceration that will require some repair.  X-ray obtained to rule out foreign body, no foreign body or fracture global, will there is some gas and soft tissue thickening which correlates with areas of tissue injury.  Patient given rabies vaccination and immunoglobulin, as well as tetanus vaccine.  Wounds were copiously irrigated with saline.  Larger laceration to the lateral ankle was repaired loosely with simple interrupted sutures.  Provided bulky dressing and crutches.  She has a penicillin allergy, will prescribe doxycycline and clindamycin, as patient also has documented history of QT prolongation so would like to avoid for quinolones or Flagyl.  Patient encouraged to use Motrin and Tylenol, ice and elevation for pain.  Suture removal in 7 days.  Final Clinical Impression(s) / ED Diagnoses Final diagnoses:  Dog bite, initial encounter    Rx / DC Orders ED Discharge Orders         Ordered    doxycycline (VIBRAMYCIN) 100 MG capsule  2 times daily     10/09/19 1905    acetaminophen (TYLENOL) 500 MG tablet  Every 8 hours PRN     10/09/19 1905    ibuprofen (ADVIL) 600 MG tablet  Every 6 hours PRN     10/09/19 1905           Jacqlyn Larsen, PA-C 10/10/19 0107    Milton Ferguson, MD 10/10/19 [phone]

## 2019-10-09 NOTE — Discharge Instructions (Addendum)
Take antibiotics twice daily with food on your stomach.  Use ibuprofen and Tylenol as prescribed to help with pain.  Your stitches will need to be removed in 7 days this can be done at your PCPs office, urgent care or the emergency department.  If you notice signs of infection sooner such as swelling, increasing pain, drainage you should immediately return to the emergency department.  You will need to follow-up at urgent care for your additional rabies vaccinations on the dates listed on the letter attached to your paperwork today.

## 2019-10-09 NOTE — ED Notes (Signed)
Pt in chair, pt has two punctures to the medial side of her R ankle and a aprox 2 inch U shaped laceration to the lateral/dorsal aspect of her R ankle, bleeding is stopped at this time. resps even and unlabored, pt states that this is the second time her neighbors dog bit her.

## 2019-10-09 NOTE — ED Notes (Signed)
Dressing placed per PA instructions, crutches and crutch education given, pt demonstrated understanding, pt states that her brother in law is coming to pick her up.

## 2019-10-09 NOTE — ED Triage Notes (Signed)
Animal bite to right ankle, bandaged on arrival. States the dog was picked up for quarantine

## 2019-10-10 ENCOUNTER — Telehealth (HOSPITAL_COMMUNITY): Payer: Self-pay | Admitting: Student

## 2019-10-10 MED ORDER — CLINDAMYCIN HCL 300 MG PO CAPS: 300.0000 mg | ORAL_CAPSULE | Freq: Four times a day (QID) | ORAL | 0 refills | Status: DC

## 2019-10-10 NOTE — Telephone Encounter (Cosign Needed)
Pt seen in ED this evening for dog bite, additional antibiotic coverage added, since pt has penicillin allergy.

## 2019-10-15 ENCOUNTER — Ambulatory Visit: Payer: Medicare Other | Admitting: Family Medicine

## 2019-10-18 NOTE — Progress Notes (Signed)
Cardiology Office Note  Date: 10/19/2019   ID: Darlene Wilcox, DOB [birth year], MRN [id number]  PCP:  [facility]  Cardiologist:  Darlene Dolly, MD Electrophysiologist:  None   Chief Complaint: F/U NICM, CP, HLD  History of Present Illness: Darlene Wilcox is a 59 y.o. female with a history of NICM, HLD, CP  NICM : echo 07/2016 EF 40-45%, echo 01/2018 EF 50-55% Cardiac cath 08/08/2016 RCA 10% Normal LVEDP , EF 40%, No significant CAD. Med therapy limited by soft BP's  Last encounter with Dr Darlene Wilcox. Had some SOB chronic. She had run out of her inhalers. Establishing with new PCP. Episodes of L sided stabbing like CP radiated to left arm. Lasting up to 24 hrs. Not positional. Had some ongoing SOB. Unclear if r/t COPD or recurrent LV dysfunction. IF r/t LV dysfunction would need to restart BB. CP symptoms were similar to previous symptoms when patient had normal cath. EKG showed no ischemic changes.  Patient states she is recently had an increased heart rate and having episodes of chest pain.  She states it could be stress related because she has issues with her mother and domestic issues related to that situation.  She is also a smoker.  She states the chest pain is predictable when she performs any exertional activity she has substernal chest pain with associated nausea, vomiting, and diaphoresis.  States the chest pain is relieved with rest.  She denies any radiation but states she does have some dizziness with chest pain and on a normal basis but no presyncopal or syncopal episodes.  Denies any CVA or TIA-like symptoms, or bleeding issues.  She does complain of some pain when walking in her calves and thighs relieved with rest.  She continues to smoke.   Past Medical History:  Diagnosis Date  . Anxiety   . Asthma    daily inhaler  . Bipolar disorder ([place])   . Bronchitis due to tobacco use 07/2016  . 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  CHF (congestive heart failure) ([place])   . Chronic back pain greater than 3 months duration   . Depression   . Full dentures   . GERD (gastroesophageal reflux disease)   . Headache(784.0)    1-2 x/month  . History of ankle sprain    right; 2 weeks ago;  c/o severe pain  . Seizures ([place])    states "silent seizures"; is on anticonvulsant; none in 2-3 mos.    Past Surgical History:  Procedure Laterality Date  . ABDOMINAL HYSTERECTOMY     partial  . APPENDECTOMY    . BUNIONECTOMY Left   . CARPAL TUNNEL RELEASE Right 01/13/2013   Procedure: CARPAL TUNNEL RELEASE;  Surgeon: Darlene Wilcox., MD;  Location: [facility];  Service: Orthopedics;  Laterality: Right;  . CERVICAL FUSION     x 2  . DIAGNOSTIC LAPAROSCOPY  11/08/2006  . INGUINAL HERNIA REPAIR Right   . LAPAROSCOPIC APPENDECTOMY  11/08/2006  . LEFT HEART CATH AND CORONARY ANGIOGRAPHY N/A 08/08/2016   Procedure: Left Heart Cath and Coronary Angiography;  Surgeon: Darlene Hampshire, MD;  Location: [facility];  Service: Cardiovascular;  Laterality: N/A;  . LUMBAR FUSION  06/28/2014   L2-5  . LUMBAR LAMINECTOMY  02/05/2007   L4-5 fusion  . TUBAL LIGATION      Current Outpatient Medications  Medication Sig Dispense Refill  . acetaminophen (TYLENOL) 500 MG tablet Take 2  tablets (1,000 mg total) by mouth every 8 (eight) hours as needed. 30 tablet 0  . ASMANEX HFA 200 MCG/ACT AERO Inhale 2 puffs into the lungs 2 (two) times daily.    Marland Kitchen aspirin EC 81 MG tablet Take 81 mg by mouth daily.    . benztropine (COGENTIN) 1 MG tablet Take 1 mg by mouth daily.    . clonazePAM (KLONOPIN) 1 MG tablet Take 1 mg by mouth 3 (three) times daily.    . cyclobenzaprine (FLEXERIL) 10 MG tablet Take 1 tablet (10 mg total) by mouth 3 (three) times daily. (Patient taking differently: Take 10 mg by mouth 3 (three) times daily as needed. ) 20 tablet 0  . diphenhydrAMINE (BENADRYL) 25 MG tablet Take 1 tablet (25 mg total) by mouth every 6  (six) hours as needed for itching. 30 tablet 0  . esomeprazole (NEXIUM) 40 MG capsule Take 40 mg by mouth daily at 12 noon.    Marland Kitchen ibuprofen (ADVIL) 600 MG tablet Take 1 tablet (600 mg total) by mouth every 6 (six) hours as needed. 30 tablet 0  . INGREZZA 80 MG CAPS Take 1 capsule by mouth daily.    Marland Kitchen lamoTRIgine (LAMICTAL) 25 MG tablet Take 1 tablet (25 mg total) by mouth daily. 30 tablet 0  . losartan (COZAAR) 25 MG tablet Take 25 mg by mouth daily.    Marland Kitchen lurasidone (LATUDA) 20 MG TABS tablet Take 1 tablet (20 mg total) by mouth daily with breakfast. 30 tablet 0  . meloxicam (MOBIC) 15 MG tablet Take 15 mg by mouth daily.    . Multiple Vitamin (MULTIVITAMIN WITH MINERALS) TABS tablet Take 1 tablet by mouth daily.    Marland Kitchen MYRBETRIQ 50 MG TB24 tablet Take 50 mg by mouth daily.    . nitroGLYCERIN (NITROSTAT) 0.4 MG SL tablet PLACE 1 TAB UNDER TONGUE EVERY 3 MINUTES AS DIRECTED UP TO 3 TIMES ASNEEDED FOR CHEST PAIN. 25 tablet 3  . omega-3 acid ethyl esters (LOVAZA) 1 g capsule Take 1 capsule (1 g total) by mouth 2 (two) times daily. 60 capsule 0  . oxcarbazepine (TRILEPTAL) 600 MG tablet Take 600 mg by mouth 2 (two) times daily.    Marland Kitchen PROAIR HFA 108 (90 Base) MCG/ACT inhaler INHALE 2 PUFFS INTO THE LUNGS EVERY 4 HOURS AS NEEDED FOR WHEEZING ORSHORTNESS OF BREATH. (Patient taking differently: Inhale 1-2 puffs into the lungs every 6 (six) hours as needed for wheezing or shortness of breath. ) 8.5 g 0  . QUEtiapine (SEROQUEL) 50 MG tablet Take 50 mg by mouth at bedtime.    . topiramate (TOPAMAX) 200 MG tablet Take 200 mg by mouth at bedtime.    . traZODone (DESYREL) 100 MG tablet Take 100 mg by mouth at bedtime.     No current facility-administered medications for this visit.   Allergies:  Apple, Penicillins, and Bee venom   Social History: The patient  reports that she has been smoking cigarettes. She started smoking about 45 years ago. She has a 20.00 pack-year smoking history. She has never used  smokeless tobacco. She reports current drug use. Drug: Marijuana. She reports that she does not drink alcohol.   Family History: The patient's family history includes Diabetes in her maternal grandmother; Heart attack in her maternal grandmother; Lung cancer in her father.   ROS:  Please see the history of present illness. Otherwise, complete review of systems is positive for none.  All other systems are reviewed and negative.   Physical Exam:  VS:  BP 140/90   Pulse 90   Ht 5\' 2"  (1.575 m)   Wt 162 lb 3.2 oz (73.6 kg)   BMI 29.67 kg/m , BMI Body mass index is 29.67 kg/m.  Wt Readings from Last 3 Encounters:  10/19/19 162 lb 3.2 oz (73.6 kg)  10/09/19 156 lb (70.8 kg)  06/18/19 158 lb (71.7 kg)    General: Patient appears comfortable at rest. Neck: Supple, no elevated JVP or carotid bruits, no thyromegaly. Lungs: Clear to auscultation, nonlabored breathing at rest. Cardiac: Regular rate and rhythm, no S3 or significant systolic murmur, no pericardial rub. Extremities: No pitting edema, distal pulses 2+. Skin: Warm and dry. Musculoskeletal: No kyphosis. Neuropsychiatric: Alert and oriented x3, affect grossly appropriate.  ECG:  An ECG dated 10/19/2019 was personally reviewed today and demonstrated:  Sinus rhythm rate of 86.  Poor R wave progression/nonspecific/consider old anterior infarct.  Recent Labwork: 12/01/2018: ALT 17; AST 19; BUN 12; Creatinine, Ser 0.61; Hemoglobin 13.9; Platelets 203; Potassium 3.6; Sodium 141 12/03/2018: TSH 1.880     Component Value Date/Time   CHOL 229 (H) 12/03/2018 1809   CHOL 220 (H) 11/03/2013 1519   TRIG 466 (H) 12/03/2018 1809   TRIG 359 (H) 07/30/2014 0910   HDL 25 (L) 12/03/2018 1809   HDL 33 (L) 07/30/2014 0910   CHOLHDL 9.2 12/03/2018 1809   VLDL UNABLE TO CALCULATE IF TRIGLYCERIDE OVER 400 mg/dL 12/03/2018 1809   LDLCALC UNABLE TO CALCULATE IF TRIGLYCERIDE OVER 400 mg/dL 12/03/2018 1809   LDLCALC Comment 11/03/2013 1519   LDLCALC 69  03/30/2013 1219   LDLDIRECT 115.5 (H) 12/03/2018 1809    Other Studies Reviewed Today:   Diagnostic Studies  Echo 08/06/2016 LV EF: 40% - 45%  Study Conclusions  - Left ventricle: The cavity size was normal. Wall thickness was normal. Systolic function was mildly to moderately reduced. The estimated ejection fraction was in the range of 40% to 45%. Diffuse hypokinesis. - Mitral valve: There was mild regurgitation. - Pulmonary arteries: Systolic pressure was mildly increased. PA peak pressure: 31 mm Hg (S).     Cath 08/08/2016 Conclusion     Mid RCA lesion, 10 %stenosed.  LV end diastolic pressure is normal.  There is mild to moderate left ventricular systolic dysfunction.  There is no mitral valve regurgitation.  1. Near normal coronary arteries with no evidence of obstructive disease. 2. Mild to moderately reduced LV systolic function with an ejection fraction of 40% with global hypokinesis. 3. Normal left ventricular end-diastolic pressure.  Recommendations: Continue medical therapy for nonischemic cardiomyopathy. The patient does not require further intravenous diuresis and I switched her to small dose oral furosemide. Right heart catheterization was planned. However, it could not be done via the right antecubital area due to cellulitis and the wire could not be advanced via the left antecubital vein. Given that her LVEDP was normal, I elected not to pursue a right heart catheterization. There was no evidence of pulmonary hypertension on echo.    Assessment and Plan:  1. NICM (nonischemic cardiomyopathy) ([place])   2. Other chest pain   3. Palpitations   4. Claudication ([place])    1. NICM (nonischemic cardiomyopathy) ([place]) Last echo showed EF of 40 to 45% with diffuse hypokinesis.  Mild MR 2018  2. Other chest pain Complaining of chest pain when exerting with associated nausea, vomiting, or diaphoresis.  Please get a Lexiscan Cardiolite  stress test.  Continue aspirin 81 mg.  Continue sublingual nitroglycerin as needed  3. Palpitations Complains of palpitations/racing heart rate.  She continues to smoke and drink caffeine.  Advised her to stop smoking and significantly decrease caffeine intake.  EKG today shows a heart rate of 86 normal sinus rhythm with no ectopic rhythms noted.  4.  Claudication Complaining of calf and thigh pain when ambulating and relieved at rest.  Please get a lower extremity arterial study with ABIs.  5.  Hypertension Blood pressure today was 140/90.  However previous blood pressure on 06/18/2019 was 107/67.  Patient states she smoked a cigarette on the way into the clinic today.  Continue losartan 25 mg daily.  6.  Smoking History of long-term smoking advised patient to quit smoking.  Medication Adjustments/Labs and Tests Ordered: Current medicines are reviewed at length with the patient today.  Concerns regarding medicines are outlined above.   Disposition: Follow-up with Dr. Harl Wilcox or APP 6 months  Signed, Levell July, NP 10/19/2019 11:51 AM    [facility] [place], [place], [place] [address] Phone: [phone]; Fax: [phone]

## 2019-10-19 ENCOUNTER — Encounter: Payer: Self-pay | Admitting: *Deleted

## 2019-10-19 ENCOUNTER — Ambulatory Visit (INDEPENDENT_AMBULATORY_CARE_PROVIDER_SITE_OTHER): Payer: Medicare Other | Admitting: Family Medicine

## 2019-10-19 ENCOUNTER — Telehealth: Payer: Self-pay | Admitting: Family Medicine

## 2019-10-19 ENCOUNTER — Other Ambulatory Visit: Payer: Self-pay

## 2019-10-19 ENCOUNTER — Encounter: Payer: Self-pay | Admitting: Family Medicine

## 2019-10-19 VITALS — BP 140/90 | HR 90 | Ht 62.0 in | Wt 162.2 lb

## 2019-10-19 DIAGNOSIS — I428 Other cardiomyopathies: Secondary | ICD-10-CM

## 2019-10-19 DIAGNOSIS — R0789 Other chest pain: Secondary | ICD-10-CM | POA: Diagnosis not present

## 2019-10-19 DIAGNOSIS — R002 Palpitations: Secondary | ICD-10-CM

## 2019-10-19 DIAGNOSIS — I739 Peripheral vascular disease, unspecified: Secondary | ICD-10-CM

## 2019-10-19 DIAGNOSIS — F17200 Nicotine dependence, unspecified, uncomplicated: Secondary | ICD-10-CM

## 2019-10-19 DIAGNOSIS — I10 Essential (primary) hypertension: Secondary | ICD-10-CM

## 2019-10-19 NOTE — Telephone Encounter (Signed)
  Precert needed for: LE arterial doppler w/ABI's dx: claudication   Location: [facility]   Date: October 27, 2019

## 2019-10-19 NOTE — Telephone Encounter (Signed)
  Precert needed for: lexiscan   Location: [facility]    Date: June 4,2021

## 2019-10-19 NOTE — Patient Instructions (Addendum)
Medication Instructions:   Your physician recommends that you continue on your current medications as directed. Please refer to the Current Medication list given to you today.  Labwork:  NONE  Testing/Procedures: Your physician has requested that you have a lexiscan myoview. For further information please visit [email or web address]. Please follow instruction sheet, as given.  Follow-Up:  Your physician recommends that you schedule a follow-up appointment in: 6 months (office)  Any Other Special Instructions Will Be Listed Below (If Applicable).  If you need a refill on your cardiac medications before your next appointment, please call your pharmacy.

## 2019-10-20 NOTE — Addendum Note (Signed)
Addended by: Merlene Laughter on: 10/20/2019 04:04 PM   Modules accepted: Orders

## 2019-10-21 NOTE — ED Notes (Addendum)
10/21/2019 Pt called in today and states that she has been unable to get her follow up rabies injections because Urgent Care said they did not do them. Injections were due on 05/17 and 5/21. Pt wanted to know if she could come here to get the injections and would we have to start over. Discussed with dr Wilson Singer. Advised pt she did not need the injections at this point per dr Wilson Singer. Pt states she also has an infection in the actual wound. Advised pt she should be seen for that. Pt then states she has been seen and is on antibiotics per her PCP. Advised pt to continue her antibiotics and follow up with her pcp.

## 2019-10-27 ENCOUNTER — Ambulatory Visit (HOSPITAL_COMMUNITY): Admission: RE | Admit: 2019-10-27 | Payer: Medicare Other | Source: Ambulatory Visit

## 2019-10-30 ENCOUNTER — Encounter (HOSPITAL_COMMUNITY): Admission: RE | Admit: 2019-10-30 | Payer: Medicare Other | Source: Ambulatory Visit

## 2019-10-30 ENCOUNTER — Encounter (HOSPITAL_COMMUNITY): Payer: Medicare Other

## 2019-11-18 ENCOUNTER — Other Ambulatory Visit: Payer: Self-pay | Admitting: Cardiology

## 2019-11-24 ENCOUNTER — Ambulatory Visit (INDEPENDENT_AMBULATORY_CARE_PROVIDER_SITE_OTHER): Payer: Medicare Other | Admitting: Podiatry

## 2019-11-24 ENCOUNTER — Other Ambulatory Visit: Payer: Self-pay

## 2019-11-24 ENCOUNTER — Ambulatory Visit: Payer: Medicare Other

## 2019-11-24 ENCOUNTER — Encounter: Payer: Self-pay | Admitting: Podiatry

## 2019-11-24 DIAGNOSIS — S9001XA Contusion of right ankle, initial encounter: Secondary | ICD-10-CM | POA: Diagnosis not present

## 2019-11-24 DIAGNOSIS — M778 Other enthesopathies, not elsewhere classified: Secondary | ICD-10-CM

## 2019-11-24 DIAGNOSIS — W540XXA Bitten by dog, initial encounter: Secondary | ICD-10-CM

## 2019-11-24 MED ORDER — MUPIROCIN 2 % EX OINT: TOPICAL_OINTMENT | CUTANEOUS | 1 refills | Status: DC

## 2019-11-24 MED ORDER — CLINDAMYCIN HCL 150 MG PO CAPS
150.0000 mg | ORAL_CAPSULE | Freq: Three times a day (TID) | ORAL | 1 refills | Status: DC
Start: 2019-11-24 — End: 2020-02-04

## 2019-11-25 NOTE — Progress Notes (Signed)
Subjective:  Patient ID: Darlene Wilcox, female    DOB: [birth year],  MRN: [id number] HPI Chief Complaint  Patient presents with  . Ankle Pain    Lateral ankle right - got bit by pitbull last year and again May 2021, Urgent Care xrayed few weeks ago-said was fractured-still very sore, wound red, swollen, Urgent care Rx;d cream and said to keep off of it  . New Patient (Initial Visit)    59 y.o. female presents with the above complaint.   ROS: Denies fever chills nausea vomiting muscle aches pains calf pain back pain chest pain shortness of breath.  Past Medical History:  Diagnosis Date  . Anxiety   . Asthma    daily inhaler  . Bipolar disorder ([place])   . Bronchitis due to tobacco use 07/2016  . 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 CHF (congestive heart failure) ([place])   . Chronic back pain greater than 3 months duration   . Depression   . Full dentures   . GERD (gastroesophageal reflux disease)   . Headache(784.0)    1-2 x/month  . History of ankle sprain    right; 2 weeks ago;  c/o severe pain  . Seizures ([place])    states "silent seizures"; is on anticonvulsant; none in 2-3 mos.   Past Surgical History:  Procedure Laterality Date  . ABDOMINAL HYSTERECTOMY     partial  . APPENDECTOMY    . BUNIONECTOMY Left   . CARPAL TUNNEL RELEASE Right 01/13/2013   Procedure: CARPAL TUNNEL RELEASE;  Surgeon: Cammie Sickle., MD;  Location: [facility];  Service: Orthopedics;  Laterality: Right;  . CERVICAL FUSION     x 2  . DIAGNOSTIC LAPAROSCOPY  11/08/2006  . INGUINAL HERNIA REPAIR Right   . LAPAROSCOPIC APPENDECTOMY  11/08/2006  . LEFT HEART CATH AND CORONARY ANGIOGRAPHY N/A 08/08/2016   Procedure: Left Heart Cath and Coronary Angiography;  Surgeon: Wellington Hampshire, MD;  Location: [facility];  Service: Cardiovascular;  Laterality: N/A;  . LUMBAR FUSION  06/28/2014   L2-5  . LUMBAR LAMINECTOMY  02/05/2007   L4-5 fusion  . TUBAL LIGATION       Current Outpatient Medications:  .  beclomethasone (QVAR) 80 MCG/ACT inhaler, Inhale into the lungs., Disp: , Rfl:  .  tiZANidine (ZANAFLEX) 4 MG tablet, Take by mouth., Disp: , Rfl:  .  ziprasidone (GEODON) 80 MG capsule, Take by mouth., Disp: , Rfl:  .  acetaminophen (TYLENOL) 500 MG tablet, Take 2 tablets (1,000 mg total) by mouth every 8 (eight) hours as needed., Disp: 30 tablet, Rfl: 0 .  ASMANEX, 60 METERED DOSES, 220 MCG/INH inhaler, Inhale 1 puff into the lungs 2 (two) times daily., Disp: , Rfl:  .  aspirin EC 81 MG tablet, Take 81 mg by mouth daily., Disp: , Rfl:  .  benztropine (COGENTIN) 1 MG tablet, Take 1 mg by mouth daily., Disp: , Rfl:  .  clindamycin (CLEOCIN) 150 MG capsule, Take 1 capsule (150 mg total) by mouth 3 (three) times daily., Disp: 30 capsule, Rfl: 1 .  clonazePAM (KLONOPIN) 1 MG tablet, Take 1 mg by mouth 3 (three) times daily., Disp: , Rfl:  .  cyclobenzaprine (FLEXERIL) 10 MG tablet, Take 1 tablet (10 mg total) by mouth 3 (three) times daily. (Patient taking differently: Take 10 mg by mouth 3 (three) times daily as needed. ), Disp: 20 tablet, Rfl: 0 .  diphenhydrAMINE (BENADRYL) 25 MG tablet, Take  1 tablet (25 mg total) by mouth every 6 (six) hours as needed for itching., Disp: 30 tablet, Rfl: 0 .  divalproex (DEPAKOTE ER) 500 MG 24 hr tablet, Take by mouth., Disp: , Rfl:  .  esomeprazole (NEXIUM) 40 MG capsule, Take 40 mg by mouth daily at 12 noon., Disp: , Rfl:  .  ibuprofen (ADVIL) 600 MG tablet, Take 1 tablet (600 mg total) by mouth every 6 (six) hours as needed., Disp: 30 tablet, Rfl: 0 .  INGREZZA 80 MG CAPS, Take 1 capsule by mouth daily., Disp: , Rfl:  .  lamoTRIgine (LAMICTAL) 25 MG tablet, Take 1 tablet (25 mg total) by mouth daily., Disp: 30 tablet, Rfl: 0 .  losartan (COZAAR) 25 MG tablet, Take 25 mg by mouth daily., Disp: , Rfl:  .  lurasidone (LATUDA) 20 MG TABS tablet, Take 1 tablet (20 mg total) by mouth daily with breakfast., Disp: 30 tablet,  Rfl: 0 .  meloxicam (MOBIC) 15 MG tablet, Take 15 mg by mouth daily., Disp: , Rfl:  .  Multiple Vitamin (MULTIVITAMIN WITH MINERALS) TABS tablet, Take 1 tablet by mouth daily., Disp: , Rfl:  .  mupirocin ointment (BACTROBAN) 2 %, Apply to wound after soaking BID, Disp: 30 g, Rfl: 1 .  MYRBETRIQ 50 MG TB24 tablet, Take 50 mg by mouth daily., Disp: , Rfl:  .  nitroGLYCERIN (NITROSTAT) 0.4 MG SL tablet, PLACE 1 TAB UNDER TONGUE EVERY 3 MINUTES AS DIRECTED UP TO 3 TIMES ASNEEDED FOR CHEST PAIN., Disp: 25 tablet, Rfl: 0 .  omega-3 acid ethyl esters (LOVAZA) 1 g capsule, Take 1 capsule (1 g total) by mouth 2 (two) times daily., Disp: 60 capsule, Rfl: 0 .  oxcarbazepine (TRILEPTAL) 600 MG tablet, Take 600 mg by mouth 2 (two) times daily., Disp: , Rfl:  .  pregabalin (LYRICA) 150 MG capsule, Take by mouth., Disp: , Rfl:  .  PROAIR HFA 108 (90 Base) MCG/ACT inhaler, INHALE 2 PUFFS INTO THE LUNGS EVERY 4 HOURS AS NEEDED FOR WHEEZING ORSHORTNESS OF BREATH. (Patient taking differently: Inhale 1-2 puffs into the lungs every 6 (six) hours as needed for wheezing or shortness of breath. ), Disp: 8.5 g, Rfl: 0 .  QUEtiapine (SEROQUEL) 50 MG tablet, Take 50 mg by mouth at bedtime., Disp: , Rfl:  .  topiramate (TOPAMAX) 200 MG tablet, Take 200 mg by mouth at bedtime., Disp: , Rfl:  .  traZODone (DESYREL) 100 MG tablet, Take 100 mg by mouth at bedtime., Disp: , Rfl:   Allergies  Allergen Reactions  . Apple Anaphylaxis and Shortness Of Breath  . Penicillins Anaphylaxis, Shortness Of Breath, Itching and Swelling    Has patient had a PCN reaction causing immediate rash, facial/tongue/throat swelling, SOB or lightheadedness with hypotension: Unknown, childhood reaction Has patient had a PCN reaction causing severe rash involving mucus membranes or skin necrosis: No Has patient had a PCN reaction that required hospitalization No Has patient had a PCN reaction occurring within the last 10 years: No If all of the above  answers are "NO", then may proceed with Cephalosporin use.   . Bee Venom Swelling    Takes benadryl to help with reaction   Review of Systems Objective:  There were no vitals filed for this visit.  General: Well developed, nourished, in no acute distress, alert and oriented x3   Dermatological: Skin is warm, dry and supple bilateral. Nails x 10 are well maintained; remaining integument appears unremarkable at this time. There are no open  sores, no preulcerative lesions, no rash or signs of infection present.  Currently there are no puncture wounds there is no crepitus beneath the skin on palpation.  Is mildly tender as well refer to the healing laceration from the dog bite was in the posterior lateral aspect of the ankle area between the Achilles and the lateral malleolus.  The scar is slightly hypertrophic but does not appear to be any type of infection other than mild tenderness and very mild erythema.  Vascular: Dorsalis Pedis artery and Posterior Tibial artery pedal pulses are 2/4 bilateral with immedate capillary fill time. Pedal hair growth present. No varicosities and no lower extremity edema present bilateral.   Neruologic: Grossly intact via light touch bilateral. Vibratory intact via tuning fork bilateral. Protective threshold with Semmes Wienstein monofilament intact to all pedal sites bilateral. Patellar and Achilles deep tendon reflexes 2+ bilateral. No Babinski or clonus noted bilateral.   Musculoskeletal: No gross boney pedal deformities bilateral. No pain, crepitus, or limitation noted with foot and ankle range of motion bilateral. Muscular strength 5/5 in all groups tested bilateral.  Gait: Unassisted, Nonantalgic.    Radiographs:  Radiographs were reviewed that were already in the chart.  I did not see any signs of foreign bodies I did not see any air there did appear to be some soft tissue swelling in the posterior compartment just superior to the calcaneus.  Assessment  & Plan:   Assessment: Dog bite healing lateral foot right  Plan: Started her on clindamycin and Bactroban ointment and I will follow-up with her in a couple weeks     Envy Meno T. Anita, [place]

## 2019-12-07 ENCOUNTER — Other Ambulatory Visit: Payer: Self-pay | Admitting: Orthopaedic Surgery

## 2019-12-07 DIAGNOSIS — M4326 Fusion of spine, lumbar region: Secondary | ICD-10-CM

## 2019-12-15 ENCOUNTER — Encounter: Payer: Self-pay | Admitting: Podiatry

## 2019-12-15 ENCOUNTER — Ambulatory Visit (INDEPENDENT_AMBULATORY_CARE_PROVIDER_SITE_OTHER): Payer: 59 | Admitting: Podiatry

## 2019-12-15 ENCOUNTER — Other Ambulatory Visit: Payer: Self-pay

## 2019-12-15 DIAGNOSIS — W540XXD Bitten by dog, subsequent encounter: Secondary | ICD-10-CM | POA: Diagnosis not present

## 2019-12-15 DIAGNOSIS — M79673 Pain in unspecified foot: Secondary | ICD-10-CM | POA: Diagnosis not present

## 2019-12-15 NOTE — Progress Notes (Signed)
She presents today for follow-up of her cellulitis status post dog bite right lateral Achilles area.  She states that she feels much better she is almost finished the antibiotics and continues to apply the ointment daily.  Objective: Vital signs are stable she is alert and oriented x3.  Pulses are palpable.  There is no erythema edema cellulitis drainage or odor feels much better on palpation.  She does have a hypertrophic scar.  Assessment: Well-healing dog bite.  Plan: Finish her antibiotics and recommended Mederma for hypertrophic scar.  She will watch for signs and symptoms of recurring infection which were explained to her today and she will follow up with me as needed.

## 2019-12-21 ENCOUNTER — Ambulatory Visit: Payer: Medicare HMO | Admitting: Cardiology

## 2019-12-22 ENCOUNTER — Ambulatory Visit: Payer: Medicare Other | Admitting: Cardiology

## 2019-12-22 ENCOUNTER — Other Ambulatory Visit: Payer: Medicare Other

## 2020-01-08 ENCOUNTER — Ambulatory Visit
Admission: RE | Admit: 2020-01-08 | Discharge: 2020-01-08 | Disposition: A | Discharge status: Home / Self Care | Payer: 59 | Source: Ambulatory Visit | Attending: Orthopaedic Surgery | Admitting: Orthopaedic Surgery

## 2020-01-08 DIAGNOSIS — M4326 Fusion of spine, lumbar region: Secondary | ICD-10-CM

## 2020-01-18 ENCOUNTER — Telehealth: Payer: Self-pay | Admitting: Family Medicine

## 2020-01-18 NOTE — Telephone Encounter (Signed)
Pre-cert Verification for the following procedure    LEXISCAN   DATE:   01/26/2020  LOCATION: [facility]

## 2020-01-26 ENCOUNTER — Other Ambulatory Visit: Payer: Self-pay

## 2020-01-26 ENCOUNTER — Encounter (HOSPITAL_COMMUNITY): Payer: Self-pay

## 2020-01-26 ENCOUNTER — Encounter (HOSPITAL_BASED_OUTPATIENT_CLINIC_OR_DEPARTMENT_OTHER)
Admission: RE | Admit: 2020-01-26 | Discharge: 2020-01-26 | Disposition: A | Discharge status: Home / Self Care | Payer: 59 | Source: Ambulatory Visit | Attending: Family Medicine | Admitting: Family Medicine

## 2020-01-26 ENCOUNTER — Encounter (HOSPITAL_COMMUNITY)
Admission: RE | Admit: 2020-01-26 | Discharge: 2020-01-26 | Disposition: A | Discharge status: Home / Self Care | Payer: 59 | Source: Ambulatory Visit | Attending: Family Medicine | Admitting: Family Medicine

## 2020-01-26 DIAGNOSIS — R0789 Other chest pain: Secondary | ICD-10-CM

## 2020-01-26 HISTORY — DX: Essential (primary) hypertension: I10

## 2020-01-26 LAB — NM MYOCAR MULTI W/SPECT W/WALL MOTION / EF
LV dias vol: 107 mL (ref 46–106)
LV sys vol: 56 mL
Peak HR: 106 {beats}/min
RATE: 0.65
Rest HR: 56 {beats}/min
SDS: 2
SRS: 1
SSS: 3
TID: 1.27

## 2020-01-26 MED ORDER — TECHNETIUM TC 99M TETROFOSMIN IV KIT
10.0000 | PACK | Freq: Once | INTRAVENOUS | Status: AC | PRN
  Administered 2020-01-26: 9.8 via INTRAVENOUS

## 2020-01-26 MED ORDER — TECHNETIUM TC 99M TETROFOSMIN IV KIT
30.0000 | PACK | Freq: Once | INTRAVENOUS | Status: AC | PRN
  Administered 2020-01-26: 30 via INTRAVENOUS

## 2020-01-26 MED ORDER — REGADENOSON 0.4 MG/5ML IV SOLN
INTRAVENOUS | Status: AC
  Administered 2020-01-26: 0.4 mg via INTRAVENOUS
  Filled 2020-01-26: qty 5

## 2020-01-26 MED ORDER — SODIUM CHLORIDE FLUSH 0.9 % IV SOLN
INTRAVENOUS | Status: AC
  Administered 2020-01-26: 10 mL via INTRAVENOUS
  Filled 2020-01-26: qty 10

## 2020-01-27 ENCOUNTER — Other Ambulatory Visit: Payer: Self-pay | Admitting: Surgery

## 2020-01-29 ENCOUNTER — Other Ambulatory Visit: Payer: Self-pay | Admitting: Surgery

## 2020-01-29 DIAGNOSIS — G8929 Other chronic pain: Secondary | ICD-10-CM

## 2020-02-03 ENCOUNTER — Telehealth: Payer: Self-pay | Admitting: *Deleted

## 2020-02-03 NOTE — Telephone Encounter (Signed)
Darlene OGDEN, LPN  01/01/7736 36:68 PM EDT Back to Top    Notified, copy to pcp.    KYM SCANNELL, LPN  06/01/9468 7:61 PM EDT     Left message to return call.   Verta Ellen., NP  01/27/2020 9:16 PM EDT     Please call the patient and let her know the stress test did not show any major areas of lack of blood flow. This was considered a low risk test by the cardiologist who read the study. Thank you

## 2020-02-03 NOTE — Progress Notes (Signed)
Cardiology Office Note  Date: 02/04/2020   ID: Darlene Wilcox, DOB 09-14-1960, MRN [id number]  PCP:  [facility]  Cardiologist:  Carlyle Dolly, MD Electrophysiologist:  None   Chief Complaint: F/U NICM, CP, HLD  History of Present Illness: Darlene Wilcox is a 59 y.o. female with a history of NICM, HLD, CP  NICM : echo 07/2016 EF 40-45%, echo 01/2018 EF 50-55% Cardiac cath 08/08/2016 RCA 10% Normal LVEDP , EF 40%, No significant CAD. Med therapy limited by soft BP's  Last encounter with Dr Harl Bowie. Had some SOB chronic. She had run out of her inhalers. Establishing with new PCP. Episodes of L sided stabbing like CP radiated to left arm. Lasting up to 24 hrs. Not positional. Had some ongoing SOB. Unclear if r/t COPD or recurrent LV dysfunction. IF r/t LV dysfunction would need to restart BB. CP symptoms were similar to previous symptoms when patient had normal cath. EKG showed no ischemic changes.  At last visit she  recently had an increased heart rate and having episodes of chest pain.  She stated it could be stress related because she was having issues with her mother and domestic issues related to that situation.  She was also a smoker.  She stated the chest pain was predictable when she performed any exertional activity she had substernal chest pain with associated nausea, vomiting, and diaphoresis.  States the chest pain was relieved with rest.  She denied any radiation but stated she did have some dizziness with chest pain  on a normal basis but no presyncopal or syncopal episodes.  Denied any CVA or TIA-like symptoms, or bleeding issues.  She did complain of some pain when walking in her calves and thighs relieved with rest.  She continued to smoke.  She presents today for follow-up.  States she has not had any further chest pain.  We went over with the results of her stress test which was negative for ischemia.  She verbalizes understanding.  She  denies any other anginal or exertional symptoms, palpitations or arrhythmias, orthostatic symptoms, CVA or TIA-like symptoms.  Denies any PND or orthopnea, denies any lower extremity edema, claudication-like symptoms, or lower extremity edema.  She states she has a lot of back issues and this may have been the reason in the past for her lower leg pain.  I advised her that we had scheduled her for her lower arterial study due to leg pain.  She states she believes the leg pain is due to her back issues and not due to the lack of blood flow.   Past Medical History:  Diagnosis Date  . Anxiety   . Asthma    daily inhaler  . Bipolar disorder ([place])   . Bronchitis due to tobacco use 07/2016  . 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 CHF (congestive heart failure) ([place])   . Chronic back pain greater than 3 months duration   . Depression   . Full dentures   . GERD (gastroesophageal reflux disease)   . Headache(784.0)    1-2 x/month  . History of ankle sprain    right; 2 weeks ago;  c/o severe pain  . Hypertension   . Seizures ([place])    states "silent seizures"; is on anticonvulsant; none in 2-3 mos.    Past Surgical History:  Procedure Laterality Date  . ABDOMINAL HYSTERECTOMY     partial  . APPENDECTOMY    . BUNIONECTOMY Left   .  CARPAL TUNNEL RELEASE Right 01/13/2013   Procedure: CARPAL TUNNEL RELEASE;  Surgeon: Cammie Sickle., MD;  Location: [facility];  Service: Orthopedics;  Laterality: Right;  . CERVICAL FUSION     x 2  . DIAGNOSTIC LAPAROSCOPY  11/08/2006  . INGUINAL HERNIA REPAIR Right   . LAPAROSCOPIC APPENDECTOMY  11/08/2006  . LEFT HEART CATH AND CORONARY ANGIOGRAPHY N/A 08/08/2016   Procedure: Left Heart Cath and Coronary Angiography;  Surgeon: Wellington Hampshire, MD;  Location: [facility];  Service: Cardiovascular;  Laterality: N/A;  . LUMBAR FUSION  06/28/2014   L2-5  . LUMBAR LAMINECTOMY  02/05/2007   L4-5 fusion  . TUBAL LIGATION       Current Outpatient Medications  Medication Sig Dispense Refill  . acetaminophen (TYLENOL) 500 MG tablet Take 2 tablets (1,000 mg total) by mouth every 8 (eight) hours as needed. 30 tablet 0  . ASMANEX, 60 METERED DOSES, 220 MCG/INH inhaler Inhale 1 puff into the lungs 2 (two) times daily.    Marland Kitchen aspirin EC 81 MG tablet Take 81 mg by mouth daily.    . beclomethasone (QVAR) 80 MCG/ACT inhaler Inhale into the lungs.    . clonazePAM (KLONOPIN) 1 MG tablet Take 1 mg by mouth 3 (three) times daily.    . diphenhydrAMINE (BENADRYL) 25 MG tablet Take 1 tablet (25 mg total) by mouth every 6 (six) hours as needed for itching. 30 tablet 0  . esomeprazole (NEXIUM) 40 MG capsule Take 40 mg by mouth daily at 12 noon.    . gabapentin (NEURONTIN) 300 MG capsule Take 300 mg by mouth 3 (three) times daily.    Marland Kitchen ibuprofen (ADVIL) 600 MG tablet Take 1 tablet (600 mg total) by mouth every 6 (six) hours as needed. 30 tablet 0  . LATUDA 60 MG TABS Take 1 tablet by mouth at bedtime.    Marland Kitchen losartan (COZAAR) 25 MG tablet Take 25 mg by mouth daily.    . nitroGLYCERIN (NITROSTAT) 0.4 MG SL tablet PLACE 1 TAB UNDER TONGUE EVERY 3 MINUTES AS DIRECTED UP TO 3 TIMES ASNEEDED FOR CHEST PAIN. 25 tablet 0  . prazosin (MINIPRESS) 1 MG capsule Take 1 mg by mouth at bedtime.    Marland Kitchen PROAIR HFA 108 (90 Base) MCG/ACT inhaler INHALE 2 PUFFS INTO THE LUNGS EVERY 4 HOURS AS NEEDED FOR WHEEZING ORSHORTNESS OF BREATH. (Patient taking differently: Inhale 1-2 puffs into the lungs every 6 (six) hours as needed for wheezing or shortness of breath. ) 8.5 g 0  . QUEtiapine (SEROQUEL) 50 MG tablet Take 50 mg by mouth at bedtime.    Marland Kitchen tiZANidine (ZANAFLEX) 4 MG tablet Take by mouth.    Marland Kitchen MYRBETRIQ 50 MG TB24 tablet Take 50 mg by mouth daily. (Patient not taking: Reported on 02/04/2020)    . omega-3 acid ethyl esters (LOVAZA) 1 g capsule Take 1 capsule (1 g total) by mouth 2 (two) times daily. (Patient not taking: Reported on 02/04/2020) 60 capsule 0    No current facility-administered medications for this visit.   Allergies:  Apple, Penicillins, and Bee venom   Social History: The patient  reports that she has been smoking cigarettes. She started smoking about 45 years ago. She has a 20.00 pack-year smoking history. She has never used smokeless tobacco. She reports current drug use. Drug: Marijuana. She reports that she does not drink alcohol.   Family History: The patient's family history includes Diabetes in her maternal grandmother; Heart attack in  her maternal grandmother; Lung cancer in her father.   ROS:  Please see the history of present illness. Otherwise, complete review of systems is positive for none.  All other systems are reviewed and negative.   Physical Exam: VS:  BP 136/84   Pulse 70   Ht 5\' 2"  (1.575 m)   Wt 162 lb 9.6 oz (73.8 kg)   SpO2 98%   BMI 29.74 kg/m , BMI Body mass index is 29.74 kg/m.  Wt Readings from Last 3 Encounters:  02/04/20 162 lb 9.6 oz (73.8 kg)  10/19/19 162 lb 3.2 oz (73.6 kg)  10/09/19 156 lb (70.8 kg)    General: Patient appears comfortable at rest. Neck: Supple, no elevated JVP or carotid bruits, no thyromegaly. Lungs: Clear to auscultation, nonlabored breathing at rest. Cardiac: Regular rate and rhythm, no S3 or significant systolic murmur, no pericardial rub. Extremities: No pitting edema, distal pulses 2+. Skin: Warm and dry. Musculoskeletal: No kyphosis. Neuropsychiatric: Alert and oriented x3, affect grossly appropriate.  ECG:  An ECG dated 10/19/2019 was personally reviewed today and demonstrated:  Sinus rhythm rate of 86.  Poor R wave progression/nonspecific/consider old anterior infarct.  Recent Labwork: No results found for requested labs within last 8760 hours.     Component Value Date/Time   CHOL 229 (H) 12/03/2018 1809   CHOL 220 (H) 11/03/2013 1519   TRIG 466 (H) 12/03/2018 1809   TRIG 359 (H) 07/30/2014 0910   HDL 25 (L) 12/03/2018 1809   HDL 33 (L) 07/30/2014  0910   CHOLHDL 9.2 12/03/2018 1809   VLDL UNABLE TO CALCULATE IF TRIGLYCERIDE OVER 400 mg/dL 12/03/2018 1809   LDLCALC UNABLE TO CALCULATE IF TRIGLYCERIDE OVER 400 mg/dL 12/03/2018 1809   LDLCALC Comment 11/03/2013 1519   LDLCALC 69 03/30/2013 1219   LDLDIRECT 115.5 (H) 12/03/2018 1809    Other Studies Reviewed Today:   Echocardiogram 07/08/2019  1. Left ventricular ejection fraction, by estimation, is 50 to 55%. The left ventricle has low normal function. The left ventrical has no regional wall motion abnormalities. Left ventricular diastolic parameters were normal. 2. Right ventricular systolic function is normal. The right ventricular size is normal. There is normal pulmonary artery systolic pressure. The estimated right ventricular systolic pressure is 71.0 mmHg. 3. Left atrial size was upper normal. 4. The mitral valve is grossly normal. Mild mitral valve regurgitation. 5. The aortic valve is tricuspid. Aortic valve regurgitation is not visualized. 6. The inferior vena cava is normal in size with greater than 50% respiratory variability, suggesting right atrial pressure of 3 mmHg.   NST 01/26/2020  Narrative & Impression   There was no ST segment deviation noted during stress.  This is a low risk study.  The left ventricular ejection fraction is low normal (50%).  Small mild apical defect likely due to slight differences in apical thinning. Cannot completely exclude small apical infarct with very mild peri-infarct ischemia. Either finding would support low risk.      Echo 08/06/2016 LV EF: 40% - 45%  Study Conclusions  - Left ventricle: The cavity size was normal. Wall thickness was normal. Systolic function was mildly to moderately reduced. The estimated ejection fraction was in the range of 40% to 45%. Diffuse hypokinesis. - Mitral valve: There was mild regurgitation. - Pulmonary arteries: Systolic pressure was mildly increased. PA peak pressure: 31  mm Hg (S).     Cath 08/08/2016 Conclusion     Mid RCA lesion, 10 %stenosed.  LV end diastolic pressure is  normal.  There is mild to moderate left ventricular systolic dysfunction.  There is no mitral valve regurgitation.  1. Near normal coronary arteries with no evidence of obstructive disease. 2. Mild to moderately reduced LV systolic function with an ejection fraction of 40% with global hypokinesis. 3. Normal left ventricular end-diastolic pressure.  Recommendations: Continue medical therapy for nonischemic cardiomyopathy. The patient does not require further intravenous diuresis and I switched her to small dose oral furosemide. Right heart catheterization was planned. However, it could not be done via the right antecubital area due to cellulitis and the wire could not be advanced via the left antecubital vein. Given that her LVEDP was normal, I elected not to pursue a right heart catheterization. There was no evidence of pulmonary hypertension on echo.    Assessment and Plan:   1. NICM (nonischemic cardiomyopathy) ([place]) Echo in February 2021 showed EF of 50 to 55%.  No WMA's, mild MR.  This is an improvement from 2018 echo with EF was 40 to 45%.  2. Other chest pain At last visit complained of chest pain when exerting with associated nausea, vomiting, or diaphoresis.  Recent stress test was determined low risk. Stress test showed mild apical defect likely due to slight difference and apical thinning.  Cannot completely exclude small apical infarct, with very mild peri-infarct ischemia. No continue aspirin 81 mg.  Continue sublingual nitroglycerin as needed  3. Palpitations Complains of palpitations/racing heart rate.  She continues to smoke and drink caffeine.  Advised her to stop smoking and significantly decrease caffeine intake.  EKG today shows a heart rate of 86 normal sinus rhythm with no ectopic rhythms noted.  4.  Claudication Patient previously complained  of calf pain and ABI/lower extremity arterial study was ordered.  Patient states now she believes the pain is due to back problems.  Patient states her back problems seem to be influencing the leg pain.  5.  Hypertension Blood pressure reasonably controlled today with 136/84   Continue losartan 25 mg daily.  6.  Smoking History of long-term smoking advised patient to quit smoking.  Medication Adjustments/Labs and Tests Ordered: Current medicines are reviewed at length with the patient today.  Concerns regarding medicines are outlined above.   Disposition: Follow-up with Dr. Harl Bowie or APP 6 months  Signed, Levell July, NP 02/04/2020 10:13 AM    [facility], [place], [place] [address] Phone: [phone]; Fax: [phone]

## 2020-02-04 ENCOUNTER — Other Ambulatory Visit: Payer: Self-pay

## 2020-02-04 ENCOUNTER — Ambulatory Visit (INDEPENDENT_AMBULATORY_CARE_PROVIDER_SITE_OTHER): Payer: 59 | Admitting: Family Medicine

## 2020-02-04 ENCOUNTER — Encounter: Payer: Self-pay | Admitting: Family Medicine

## 2020-02-04 VITALS — BP 136/84 | HR 70 | Ht 62.0 in | Wt 162.6 lb

## 2020-02-04 DIAGNOSIS — R002 Palpitations: Secondary | ICD-10-CM

## 2020-02-04 DIAGNOSIS — R0789 Other chest pain: Secondary | ICD-10-CM | POA: Diagnosis not present

## 2020-02-04 DIAGNOSIS — I739 Peripheral vascular disease, unspecified: Secondary | ICD-10-CM | POA: Diagnosis not present

## 2020-02-04 DIAGNOSIS — I428 Other cardiomyopathies: Secondary | ICD-10-CM | POA: Diagnosis not present

## 2020-02-04 NOTE — Patient Instructions (Addendum)

## 2020-02-08 ENCOUNTER — Other Ambulatory Visit: Payer: Self-pay | Admitting: Cardiology

## 2020-02-09 ENCOUNTER — Other Ambulatory Visit: Payer: Self-pay | Admitting: Podiatry

## 2020-02-12 ENCOUNTER — Inpatient Hospital Stay: Admission: RE | Admit: 2020-02-12 | Payer: 59 | Source: Ambulatory Visit

## 2020-02-25 ENCOUNTER — Ambulatory Visit
Admission: RE | Admit: 2020-02-25 | Discharge: 2020-02-25 | Disposition: A | Discharge status: Home / Self Care | Payer: 59 | Source: Ambulatory Visit | Attending: Surgery | Admitting: Surgery

## 2020-02-25 ENCOUNTER — Other Ambulatory Visit: Payer: Self-pay

## 2020-02-25 DIAGNOSIS — G8929 Other chronic pain: Secondary | ICD-10-CM

## 2020-02-25 MED ORDER — IOPAMIDOL (ISOVUE-300) INJECTION 61%
100.0000 mL | Freq: Once | INTRAVENOUS | Status: AC | PRN
  Administered 2020-02-25: 100 mL via INTRAVENOUS

## 2020-03-23 NOTE — Progress Notes (Deleted)
Cardiology Office Note    Date:  03/23/2020   ID:  Darlene Wilcox, DOB 1960-11-16, MRN [id number]  PCP:  [facility]  Cardiologist: Carlyle Dolly, MD EPS: None  No chief complaint on file.   History of Present Illness:  [name] is a 59 y.o. female with history of nonischemic cardiomyopathy ejection fraction 40 to 45% on echo 07/2016 follow-up echo 01/2017 LVEF 50 to 55%.  Cath 08/08/2016 10% RCA EF 40% no significant CAD.  Repeat echo 07/08/2019 EF 50 to 55% with normal diastolic function.  Shortness of breath felt most likely due to COPD.  Recurrent chest pain has been atypical in the past NST 12/2019 low normal EF 50% with small mild apical defect likely due to apical thinning but cannot completely rule out mild peri-infarct ischemia.  Considered low risk.  Patient was seen by PCP 03/16/2020 and had lower extremity edema and was asked to follow-up with [place].  Labs reviewed from that day triglycerides elevated at 336 LDL 85 creatinine 0.95     Past Medical History:  Diagnosis Date  . Anxiety   . Asthma    daily inhaler  . Bipolar disorder ([place])   . Bronchitis due to tobacco use 07/2016  . 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 CHF (congestive heart failure) ([place])   . Chronic back pain greater than 3 months duration   . Depression   . Full dentures   . GERD (gastroesophageal reflux disease)   . Headache(784.0)    1-2 x/month  . History of ankle sprain    right; 2 weeks ago;  c/o severe pain  . Hypertension   . Seizures ([place])    states "silent seizures"; is on anticonvulsant; none in 2-3 mos.    Past Surgical History:  Procedure Laterality Date  . ABDOMINAL HYSTERECTOMY     partial  . APPENDECTOMY    . BUNIONECTOMY Left   . CARPAL TUNNEL RELEASE Right 01/13/2013   Procedure: CARPAL TUNNEL RELEASE;  Surgeon: Cammie Sickle., MD;  Location: [facility];  Service: Orthopedics;  Laterality: Right;  .  CERVICAL FUSION     x 2  . DIAGNOSTIC LAPAROSCOPY  11/08/2006  . INGUINAL HERNIA REPAIR Right   . LAPAROSCOPIC APPENDECTOMY  11/08/2006  . LEFT HEART CATH AND CORONARY ANGIOGRAPHY N/A 08/08/2016   Procedure: Left Heart Cath and Coronary Angiography;  Surgeon: Wellington Hampshire, MD;  Location: [facility];  Service: Cardiovascular;  Laterality: N/A;  . LUMBAR FUSION  06/28/2014   L2-5  . LUMBAR LAMINECTOMY  02/05/2007   L4-5 fusion  . TUBAL LIGATION      Current Medications: No outpatient medications have been marked as taking for the 03/29/20 encounter (Appointment) with Imogene Burn, PA-C.     Allergies:   Apple, Penicillins, and Bee venom   Social History   Socioeconomic History  . Marital status: Divorced    Spouse name: Not on file  . Number of children: Not on file  . Years of education: Not on file  . Highest education level: Not on file  Occupational History  . Occupation: Unemployed  Tobacco Use  . Smoking status: Current Every Day Smoker    Packs/day: 1.00    Years: 20.00    Pack years: 20.00    Types: Cigarettes    Start date: 05/22/1974  . Smokeless tobacco: Never Used  . Tobacco comment:  patient states smokes only 5  cigarettes per day  Vaping Use  . Vaping Use: Former  Substance and Sexual Activity  . Alcohol use: No    Alcohol/week: 0.0 standard drinks  . Drug use: Yes    Types: Marijuana    Comment: 2 weeks ago  . Sexual activity: Not Currently    Birth control/protection: Surgical  Other Topics Concern  . Not on file  Social History Narrative   Lives in [place], [place] with her father.    Social Determinants of Health   Financial Resource Strain:   . Difficulty of Paying Living Expenses: Not on file  Food Insecurity:   . Worried About Charity fundraiser in the Last Year: Not on file  . Ran Out of Food in the Last Year: Not on file  Transportation Needs:   . Lack of Transportation (Medical): Not on file  . Lack of Transportation  (Non-Medical): Not on file  Physical Activity:   . Days of Exercise per Week: Not on file  . Minutes of Exercise per Session: Not on file  Stress:   . Feeling of Stress : Not on file  Social Connections:   . Frequency of Communication with Friends and Family: Not on file  . Frequency of Social Gatherings with Friends and Family: Not on file  . Attends Religious Services: Not on file  . Active Member of Clubs or Organizations: Not on file  . Attends Archivist Meetings: Not on file  . Marital Status: Not on file     Family History:  The patient's ***family history includes Diabetes in her maternal grandmother; Heart attack in her maternal grandmother; Lung cancer in her father.   ROS:   Please see the history of present illness.    ROS All other systems reviewed and are negative.   PHYSICAL EXAM:   VS:  There were no vitals taken for this visit.  Physical Exam  GEN: Well nourished, well developed, in no acute distress  HEENT: normal  Neck: no JVD, carotid bruits, or masses Cardiac:RRR; no murmurs, rubs, or gallops  Respiratory:  clear to auscultation bilaterally, normal work of breathing GI: soft, nontender, nondistended, + BS Ext: without cyanosis, clubbing, or edema, Good distal pulses bilaterally MS: no deformity or atrophy  Skin: warm and dry, no rash Neuro:  Alert and Oriented x 3, Strength and sensation are intact Psych: euthymic mood, full affect  Wt Readings from Last 3 Encounters:  02/04/20 162 lb 9.6 oz (73.8 kg)  10/19/19 162 lb 3.2 oz (73.6 kg)  10/09/19 156 lb (70.8 kg)      Studies/Labs Reviewed:   EKG:  EKG is*** ordered today.  The ekg ordered today demonstrates ***  Recent Labs: No results found for requested labs within last 8760 hours.   Lipid Panel    Component Value Date/Time   CHOL 229 (H) 12/03/2018 1809   CHOL 220 (H) 11/03/2013 1519   TRIG 466 (H) 12/03/2018 1809   TRIG 359 (H) 07/30/2014 0910   HDL 25 (L) 12/03/2018 1809    HDL 33 (L) 07/30/2014 0910   CHOLHDL 9.2 12/03/2018 1809   VLDL UNABLE TO CALCULATE IF TRIGLYCERIDE OVER 400 mg/dL 12/03/2018 1809   LDLCALC UNABLE TO CALCULATE IF TRIGLYCERIDE OVER 400 mg/dL 12/03/2018 1809   LDLCALC Comment 11/03/2013 1519   LDLCALC 69 03/30/2013 1219   LDLDIRECT 115.5 (H) 12/03/2018 1809    Additional studies/ records that were reviewed today include:   Echo 07/08/2019 IMPRESSIONS     1.  Left ventricular ejection fraction, by estimation, is 50 to 55%. The  left ventricle has low normal function. The left ventrical has no regional  wall motion abnormalities. Left ventricular diastolic parameters were  normal.   2. Right ventricular systolic function is normal. The right ventricular  size is normal. There is normal pulmonary artery systolic pressure. The  estimated right ventricular systolic pressure is 16.3 mmHg.   3. Left atrial size was upper normal.   4. The mitral valve is grossly normal. Mild mitral valve regurgitation.   5. The aortic valve is tricuspid. Aortic valve regurgitation is not  visualized.   6. The inferior vena cava is normal in size with greater than 50%  respiratory variability, suggesting right atrial pressure of 3 mmHg.      ASSESSMENT:    1. NICM (nonischemic cardiomyopathy) ([place])   2. Dyspnea on exertion   3. History of chest pain   4. Hyperlipidemia, unspecified hyperlipidemia type      PLAN:  In order of problems listed above:   Nonischemic cardiomyopathy ejection fraction 40 to 45% on echo 07/2016, follow-up echo 01/2017 EF 50 to 55%, cath 08/08/2016 10% RCA EF 40% no significant CAD.  Medical therapy limited by soft blood pressures.  Some chronic dyspnea on exertion.  Repeat echo 07/08/2019 EF 50 to 55% with normal diastolic function  History of chest pain has been atypical in the past NST 12/2019 low normal EF 50% with small mild apical defect likely due to slight difference in apical thinning but cannot completely exclude small  apical infarct with very mild peri-infarct ischemia.  Low risk.  Hyperlipidemia       Medication Adjustments/Labs and Tests Ordered: Current medicines are reviewed at length with the patient today.  Concerns regarding medicines are outlined above.  Medication changes, Labs and Tests ordered today are listed in the Patient Instructions below. There are no Patient Instructions on file for this visit.   Signed, Ermalinda Barrios, PA-C  03/23/2020 3:17 PM    [facility] [place], [place], [place]  [address] Phone: [phone]; Fax: [phone]

## 2020-03-29 ENCOUNTER — Ambulatory Visit: Payer: 59 | Admitting: Physician Assistant

## 2020-03-29 DIAGNOSIS — E785 Hyperlipidemia, unspecified: Secondary | ICD-10-CM

## 2020-03-29 DIAGNOSIS — Z87898 Personal history of other specified conditions: Secondary | ICD-10-CM

## 2020-03-29 DIAGNOSIS — I428 Other cardiomyopathies: Secondary | ICD-10-CM

## 2020-03-29 DIAGNOSIS — R0600 Dyspnea, unspecified: Secondary | ICD-10-CM

## 2020-04-07 NOTE — Progress Notes (Signed)
Cardiology Office Note  Date: 04/08/2020   ID: Darlene Wilcox, DOB June 12, 1960, MRN [id number]  PCP:  [facility]  Cardiologist:  Carlyle Dolly, MD Electrophysiologist:  None   Chief Complaint: F/U NICM, CP, HLD  History of Present Illness: Darlene Wilcox is a 59 y.o. female with a history of NICM, HLD, CP  NICM : echo 07/2016 EF 40-45%, echo 01/2018 EF 50-55% Cardiac cath 08/08/2016 RCA 10% Normal LVEDP , EF 40%, No significant CAD. Med therapy limited by soft BP's  Last encounter with Dr Harl Bowie. Had some SOB chronic. She had run out of her inhalers. Establishing with new PCP. Episodes of L sided stabbing like CP radiated to left arm. Lasting up to 24 hrs. Not positional. Had some ongoing SOB. Unclear if r/t COPD or recurrent LV dysfunction. IF r/t LV dysfunction would need to restart BB. CP symptoms were similar to previous symptoms when patient had normal cath. EKG showed no ischemic changes.  Previous visit she  recently had an increased heart rate and having episodes of chest pain.  She stated it could be stress related because she was having issues with her mother and domestic issues related to that situation.  She was also a smoker.  She stated the chest pain was predictable when she performed any exertional activity she had substernal chest pain with associated nausea, vomiting, and diaphoresis.  Stated the chest pain was relieved with rest.  She denied any radiation but stated she did have some dizziness with chest pain  on a normal basis but no presyncopal or syncopal episodes.  Denied any CVA or TIA-like symptoms, or bleeding issues.  She did complain of some pain when walking in her calves and thighs relieved with rest.  She continued to smoke.  Prior visit for follow-up follow-up.  States she had not experienced any further chest pain.  We went over with the results of her stress test which was negative for ischemia.  She verbalized  understanding.  She denied any other anginal or exertional symptoms, palpitations or arrhythmias, orthostatic symptoms, CVA or TIA-like symptoms.  Denied any PND or orthopnea, denies any lower extremity edema, claudication-like symptoms, or lower extremity edema.  She dated she has a lot of back issues and this may have been the reason in the past for her lower leg pain.  I advised her that we had scheduled her for her lower arterial study due to leg pain.  She dated she believes the leg pain is due to her back issues and not due to the lack of blood flow.  Patient states she was sent here from [facility] in [place] due to abnormal labs. She had an unremarkable CBC on 03/16/2020.  Lipid panel showed direct LDL 85, total cholesterol 165, triglyceride 336, HDL 32.  She was nonfasting per provider notes.  Chemistry was normal.  Provider mention she had some swelling in her lower extremities which is not evident today.  Her weight has been maintained in the low 160s.  Blood pressures well controlled today.  She denies any anginal or exertional symptoms, orthostatic symptoms, CVA or TIA-like symptoms.  She appears euvolemic without dyspnea on exertion, weight gain, or lower extremity edema.   Past Medical History:  Diagnosis Date  . Anxiety   . Asthma    daily inhaler  . Bipolar disorder ([place])   . Bronchitis due to tobacco use 07/2016  . 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  CHF (congestive heart failure) ([place])   . Chronic back pain greater than 3 months duration   . Depression   . Full dentures   . GERD (gastroesophageal reflux disease)   . Headache(784.0)    1-2 x/month  . History of ankle sprain    right; 2 weeks ago;  c/o severe pain  . Hypertension   . Seizures ([place])    states "silent seizures"; is on anticonvulsant; none in 2-3 mos.    Past Surgical History:  Procedure Laterality Date  . ABDOMINAL HYSTERECTOMY     partial  . APPENDECTOMY    .  BUNIONECTOMY Left   . CARPAL TUNNEL RELEASE Right 01/13/2013   Procedure: CARPAL TUNNEL RELEASE;  Surgeon: Cammie Sickle., MD;  Location: [facility];  Service: Orthopedics;  Laterality: Right;  . CERVICAL FUSION     x 2  . DIAGNOSTIC LAPAROSCOPY  11/08/2006  . INGUINAL HERNIA REPAIR Right   . LAPAROSCOPIC APPENDECTOMY  11/08/2006  . LEFT HEART CATH AND CORONARY ANGIOGRAPHY N/A 08/08/2016   Procedure: Left Heart Cath and Coronary Angiography;  Surgeon: Wellington Hampshire, MD;  Location: [facility];  Service: Cardiovascular;  Laterality: N/A;  . LUMBAR FUSION  06/28/2014   L2-5  . LUMBAR LAMINECTOMY  02/05/2007   L4-5 fusion  . TUBAL LIGATION      Current Outpatient Medications  Medication Sig Dispense Refill  . ASMANEX, 60 METERED DOSES, 220 MCG/INH inhaler Inhale 1 puff into the lungs 2 (two) times daily.    Marland Kitchen aspirin EC 81 MG tablet Take 81 mg by mouth daily.    . beclomethasone (QVAR) 80 MCG/ACT inhaler Inhale 1 puff into the lungs 2 (two) times daily.     . clonazePAM (KLONOPIN) 0.5 MG tablet Take 0.5 mg by mouth 2 (two) times daily.    . diphenhydrAMINE (BENADRYL) 25 MG tablet Take 1 tablet (25 mg total) by mouth every 6 (six) hours as needed for itching. 30 tablet 0  . esomeprazole (NEXIUM) 40 MG capsule Take 40 mg by mouth daily at 12 noon.    . gabapentin (NEURONTIN) 300 MG capsule Take 300 mg by mouth 3 (three) times daily.    Marland Kitchen HYDROcodone-acetaminophen (NORCO) 10-325 MG tablet Take 1 tablet by mouth 4 (four) times daily.    Marland Kitchen ibuprofen (ADVIL) 600 MG tablet Take 1 tablet (600 mg total) by mouth every 6 (six) hours as needed. 30 tablet 0  . INGREZZA 80 MG CAPS Take 1 capsule by mouth at bedtime.    Marland Kitchen LATUDA 60 MG TABS Take 1 tablet by mouth at bedtime.    Marland Kitchen losartan (COZAAR) 25 MG tablet Take 25 mg by mouth daily.    . nitroGLYCERIN (NITROSTAT) 0.4 MG SL tablet PLACE 1 TAB UNDER TONGUE EVERY 3 MINUTES AS DIRECTED UP TO 3 TIMES AS NEEDED FOR CHEST PAIN. 25  tablet 3  . prazosin (MINIPRESS) 1 MG capsule Take 1 mg by mouth at bedtime.    Marland Kitchen PROAIR HFA 108 (90 Base) MCG/ACT inhaler INHALE 2 PUFFS INTO THE LUNGS EVERY 4 HOURS AS NEEDED FOR WHEEZING ORSHORTNESS OF BREATH. (Patient taking differently: Inhale 1-2 puffs into the lungs every 6 (six) hours as needed for wheezing or shortness of breath. ) 8.5 g 0  . tiZANidine (ZANAFLEX) 2 MG tablet Take 2 mg by mouth 3 (three) times daily.    . traZODone (DESYREL) 100 MG tablet Take 100 mg by mouth at bedtime.    Marland Kitchen MYRBETRIQ  50 MG TB24 tablet Take 50 mg by mouth daily. (Patient not taking: Reported on 02/04/2020)    . omega-3 acid ethyl esters (LOVAZA) 1 g capsule Take 1 capsule (1 g total) by mouth 2 (two) times daily. (Patient not taking: Reported on 02/04/2020) 60 capsule 0   No current facility-administered medications for this visit.   Allergies:  Apple, Penicillins, and Bee venom   Social History: The patient  reports that she has been smoking cigarettes. She started smoking about 45 years ago. She has a 20.00 pack-year smoking history. She has never used smokeless tobacco. She reports current drug use. Drug: Marijuana. She reports that she does not drink alcohol.   Family History: The patient's family history includes Diabetes in her maternal grandmother; Heart attack in her maternal grandmother; Lung cancer in her father.   ROS:  Please see the history of present illness. Otherwise, complete review of systems is positive for none.  All other systems are reviewed and negative.   Physical Exam: VS:  BP 116/82   Pulse 83   Ht 5\' 2"  (1.575 m)   Wt 164 lb 3.2 oz (74.5 kg)   SpO2 94%   BMI 30.03 kg/m , BMI Body mass index is 30.03 kg/m.  Wt Readings from Last 3 Encounters:  04/08/20 164 lb 3.2 oz (74.5 kg)  02/04/20 162 lb 9.6 oz (73.8 kg)  10/19/19 162 lb 3.2 oz (73.6 kg)    General: Patient appears comfortable at rest. Neck: Supple, no elevated JVP or carotid bruits, no thyromegaly. Lungs: Clear  to auscultation, nonlabored breathing at rest. Cardiac: Regular rate and rhythm, no S3 or significant systolic murmur, no pericardial rub. Extremities: No pitting edema, distal pulses 2+. Skin: Warm and dry. Musculoskeletal: No kyphosis. Neuropsychiatric: Alert and oriented x3, affect grossly appropriate.  ECG:  An ECG dated 10/19/2019 was personally reviewed today and demonstrated:  Sinus rhythm rate of 86.  Poor R wave progression/nonspecific/consider old anterior infarct.  Recent Labwork: No results found for requested labs within last 8760 hours.     Component Value Date/Time   CHOL 229 (H) 12/03/2018 1809   CHOL 220 (H) 11/03/2013 1519   TRIG 466 (H) 12/03/2018 1809   TRIG 359 (H) 07/30/2014 0910   HDL 25 (L) 12/03/2018 1809   HDL 33 (L) 07/30/2014 0910   CHOLHDL 9.2 12/03/2018 1809   VLDL UNABLE TO CALCULATE IF TRIGLYCERIDE OVER 400 mg/dL 12/03/2018 1809   LDLCALC UNABLE TO CALCULATE IF TRIGLYCERIDE OVER 400 mg/dL 12/03/2018 1809   LDLCALC Comment 11/03/2013 1519   LDLCALC 69 03/30/2013 1219   LDLDIRECT 115.5 (H) 12/03/2018 1809    Other Studies Reviewed Today  NST 01/26/2020 Study Result Narrative & Impression   There was no ST segment deviation noted during stress.  This is a low risk study.  The left ventricular ejection fraction is low normal (50%).  Small mild apical defect likely due to slight differences in apical thinning. Cannot completely exclude small apical infarct with very mild peri-infarct ischemia. Either finding would support low risk.    Echocardiogram 07/08/2019 1. Left ventricular ejection fraction, by estimation, is 50 to 55%. The left ventricle has low normal function. The left ventrical has no regional wall motion abnormalities. Left ventricular diastolic parameters were normal. 2. Right ventricular systolic function is normal. The right ventricular size is normal. There is normal pulmonary artery systolic pressure. The estimated right  ventricular systolic pressure is 85.2 mmHg. 3. Left atrial size was upper normal. 4. The mitral  valve is grossly normal. Mild mitral valve regurgitation. 5. The aortic valve is tricuspid. Aortic valve regurgitation is not visualized. 6. The inferior vena cava is normal in size with greater than 50% respiratory variability, suggesting right atrial pressure of 3 mmHg.   NST 01/26/2020  Narrative & Impression   There was no ST segment deviation noted during stress.  This is a low risk study.  The left ventricular ejection fraction is low normal (50%).  Small mild apical defect likely due to slight differences in apical thinning. Cannot completely exclude small apical infarct with very mild peri-infarct ischemia. Either finding would support low risk.      Echo 08/06/2016 LV EF: 40% - 45%  Study Conclusions  - Left ventricle: The cavity size was normal. Wall thickness was normal. Systolic function was mildly to moderately reduced. The estimated ejection fraction was in the range of 40% to 45%. Diffuse hypokinesis. - Mitral valve: There was mild regurgitation. - Pulmonary arteries: Systolic pressure was mildly increased. PA peak pressure: 31 mm Hg (S).     Cath 08/08/2016 Conclusion     Mid RCA lesion, 10 %stenosed.  LV end diastolic pressure is normal.  There is mild to moderate left ventricular systolic dysfunction.  There is no mitral valve regurgitation.  1. Near normal coronary arteries with no evidence of obstructive disease. 2. Mild to moderately reduced LV systolic function with an ejection fraction of 40% with global hypokinesis. 3. Normal left ventricular end-diastolic pressure.  Recommendations: Continue medical therapy for nonischemic cardiomyopathy. The patient does not require further intravenous diuresis and I switched her to small dose oral furosemide. Right heart catheterization was planned. However, it could not be done via the  right antecubital area due to cellulitis and the wire could not be advanced via the left antecubital vein. Given that her LVEDP was normal, I elected not to pursue a right heart catheterization. There was no evidence of pulmonary hypertension on echo.    Assessment and Plan:   1. NICM (nonischemic cardiomyopathy) ([place]) Echo in February 2021 showed EF of 50 to 55%.  No WMA's, mild MR.  This is an improvement from 2018 echo with EF was 40 to 45%.  2. Other chest pain Currently denies any anginal or exertional symptoms.  Continue aspirin 81 mg.  Continue sublingual nitroglycerin as needed  3. Palpitations Denies any recent palpitations or arrhythmias.  She continues to smoke and drink caffeine.  Advised her to stop smoking and significantly decrease caffeine intake.  She continues to smoke at today's visit and continues to drink caffeinated soda such as [facility] which she has in her pocket book today.  4.  Claudication Patient previously complained of calf pain and ABI/lower extremity arterial study was ordered.  Patient states now she believes the pain is due to back problems.  Patient states her back problems seem to be influencing the leg pain.  Denies any claudication-like symptoms.  5.  Hypertension BP 116/82 today.   Continue losartan 25 mg daily.  Continue prazosin 1 mg daily at bedtime  6.  Smoking History of long-term smoking.  Advised patient to quit smoking again.  7.  Hyperlipidemia. Recent visit to PCP with lab work.  She was nonfasting per provider notes Lipid panel showed direct LDL 85, total cholesterol 165, triglyceride 336, HDL 32.  Advised dietary changes to lower triglycerides.  Advised weight loss, exercise, reduce fat intake.  Repeat lipid panel 1 week prior to follow-up in 6 months.   Medication  Adjustments/Labs and Tests Ordered: Current medicines are reviewed at length with the patient today.  Concerns regarding medicines are outlined above.   Disposition:  Follow-up with Dr. Harl Bowie or APP 6 months  Signed, Levell July, NP 04/08/2020 10:52 AM    [facility] [place], [place], [place] [address] Phone: [phone]; Fax: [phone]

## 2020-04-08 ENCOUNTER — Encounter: Payer: Self-pay | Admitting: Family Medicine

## 2020-04-08 ENCOUNTER — Ambulatory Visit (INDEPENDENT_AMBULATORY_CARE_PROVIDER_SITE_OTHER): Payer: 59 | Admitting: Family Medicine

## 2020-04-08 VITALS — BP 116/82 | HR 83 | Ht 62.0 in | Wt 164.2 lb

## 2020-04-08 DIAGNOSIS — I428 Other cardiomyopathies: Secondary | ICD-10-CM

## 2020-04-08 DIAGNOSIS — E782 Mixed hyperlipidemia: Secondary | ICD-10-CM

## 2020-04-08 DIAGNOSIS — R0789 Other chest pain: Secondary | ICD-10-CM | POA: Diagnosis not present

## 2020-04-08 DIAGNOSIS — R002 Palpitations: Secondary | ICD-10-CM | POA: Diagnosis not present

## 2020-04-08 DIAGNOSIS — I739 Peripheral vascular disease, unspecified: Secondary | ICD-10-CM

## 2020-04-08 NOTE — Patient Instructions (Signed)
Medication Instructions:  Continue all current medications.  Labwork:  Lipids due just prior to next visit.   Will mail order when time.   Testing/Procedures: none  Follow-Up: 6 months   Any Other Special Instructions Will Be Listed Below (If Applicable).  If you need a refill on your cardiac medications before your next appointment, please call your pharmacy.

## 2020-04-29 ENCOUNTER — Ambulatory Visit: Payer: 59 | Admitting: Cardiology

## 2020-06-03 ENCOUNTER — Telehealth: Payer: Self-pay | Admitting: Cardiology

## 2020-06-03 NOTE — Telephone Encounter (Signed)
   [facility] Pre-operative Risk Assessment    HEARTCARE STAFF: - Please ensure there is not already an duplicate clearance open for this procedure. - Under Visit Info/Reason for Call, type in Other and utilize the format Clearance MM/DD/YY or Clearance TBD. Do not use dashes or single digits. - If request is for dental extraction, please clarify the # of teeth to be extracted.  Request for surgical clearance:  1. What type of surgery is being performed?  1. T10-L5 REVISION PSF  2. When is this surgery scheduled? 1. Does not state  3. What type of clearance is required (medical clearance vs. Pharmacy clearance to hold med vs. Both)? 1. Does not state  4. Are there any medications that need to be held prior to surgery and how long? 1. Does not state  5. Practice name and name of physician performing surgery?  1. [facility]    6. What is the office phone number?  1. [phone]   7.   What is the office fax number?   1.   [phone]   8.   Anesthesia type (None, local, MAC, general) ?   1.   General   Desma Paganini 06/03/2020, 3:47 PM  _________________________________________________________________   (provider comments below)

## 2020-06-06 NOTE — Telephone Encounter (Signed)
Left VM

## 2020-06-10 NOTE — Telephone Encounter (Signed)
   Primary Cardiologist: Carlyle Dolly, MD  Chart reviewed as part of pre-operative protocol coverage. Patient was contacted 06/10/2020 in reference to pre-operative risk assessment for pending surgery as outlined below.  Darlene Wilcox was last seen on 04/08/2020 by Levell July NP.  Since that day, Darlene Wilcox has done well without any chest pain or significant shortness of breath.  Patient had minimal disease on cardiac catheterization in 2018.  Echocardiogram in February 2021 showed normal EF.  Last Myoview obtained on 01/26/2020 was low risk.  She may hold aspirin for 7 days prior to the surgery and restart as soon as possible afterward at the discretion of the surgeon.  Therefore, based on ACC/AHA guidelines, the patient would be at acceptable risk for the planned procedure without further cardiovascular testing.   The patient was advised that if she develops new symptoms prior to surgery to contact our office to arrange for a follow-up visit, and she verbalized understanding.  I will route this recommendation to the requesting party via Epic fax function and remove from pre-op pool. Please call with questions.  [place], [place] 06/10/2020, 11:14 AM

## 2020-06-28 ENCOUNTER — Telehealth: Payer: Self-pay | Admitting: Cardiology

## 2020-06-28 NOTE — Telephone Encounter (Signed)
Clearance was addressed 1/14- I will resend clearance to the fax number provided-[phone].  Kerin Ransom PA-C 06/28/2020 11:11 AM

## 2020-06-28 NOTE — Telephone Encounter (Signed)
1. Patient called stating that she received telephone call from [facility]  They informed the patient that the clearance must state patient has been cleared for surgery.

## 2020-08-19 ENCOUNTER — Ambulatory Visit: Payer: 59 | Admitting: Cardiology

## 2020-09-28 ENCOUNTER — Other Ambulatory Visit: Payer: Self-pay | Admitting: *Deleted

## 2020-09-28 ENCOUNTER — Encounter: Payer: Self-pay | Admitting: *Deleted

## 2020-09-28 DIAGNOSIS — E782 Mixed hyperlipidemia: Secondary | ICD-10-CM

## 2020-10-18 ENCOUNTER — Ambulatory Visit: Payer: 59 | Admitting: Cardiology

## 2020-10-21 ENCOUNTER — Ambulatory Visit: Payer: 59 | Admitting: Cardiology

## 2020-11-24 ENCOUNTER — Ambulatory Visit: Payer: 59 | Admitting: Cardiology

## 2021-02-16 IMAGING — CT CT ABD-PELV W/ CM
1 of 3 series · 13 of 32 positions shown, 19 images · IV contrast (APPLIED)
Comparison: 06/06/2012

CLINICAL DATA: Right lower quadrant and groin pain.

EXAM:
CT ABDOMEN AND PELVIS WITH CONTRAST
TECHNIQUE: Multidetector CT imaging of the abdomen and pelvis was performed
using the standard protocol following bolus administration of
intravenous contrast.
CONTRAST:  100mL 135LVN-4VV IOPAMIDOL (135LVN-4VV) INJECTION 61%

[Series 2: abd/pelvis w/cm · axial · 0.81mm/px · z∈[-485,-75]mm · 13 of 96 slices shown, 19 images]
[im 7/96  soft-tissue]
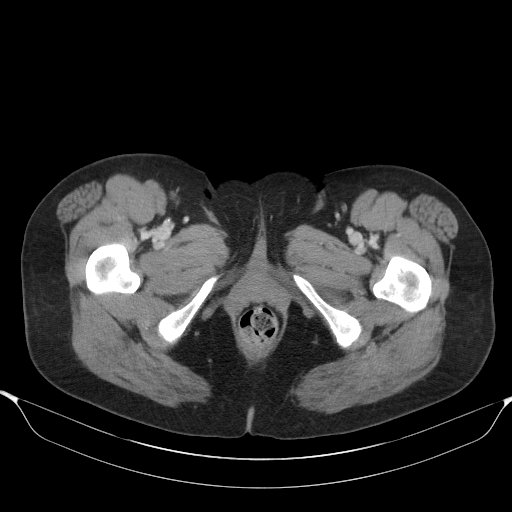
[im 7/96  bone]
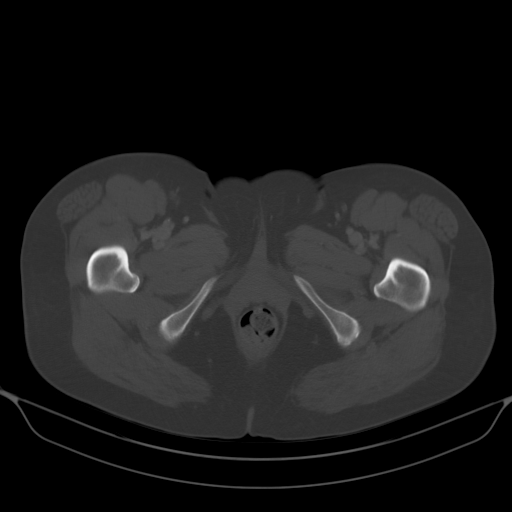
[im 14/96  soft-tissue]
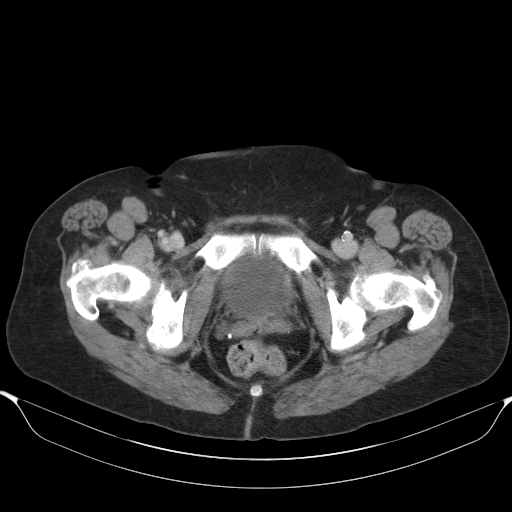
[im 21/96  soft-tissue]
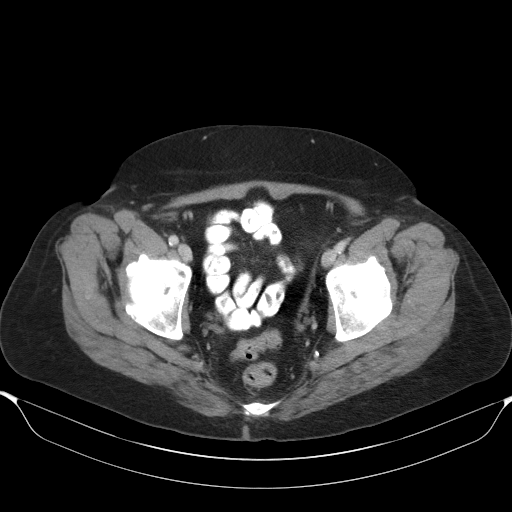
[im 28/96  soft-tissue]
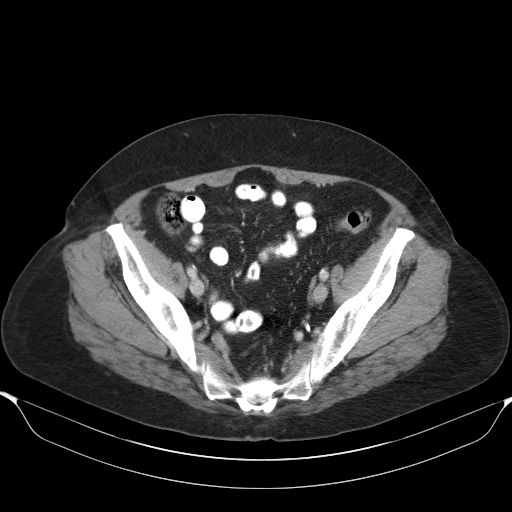
[im 34/96  soft-tissue]
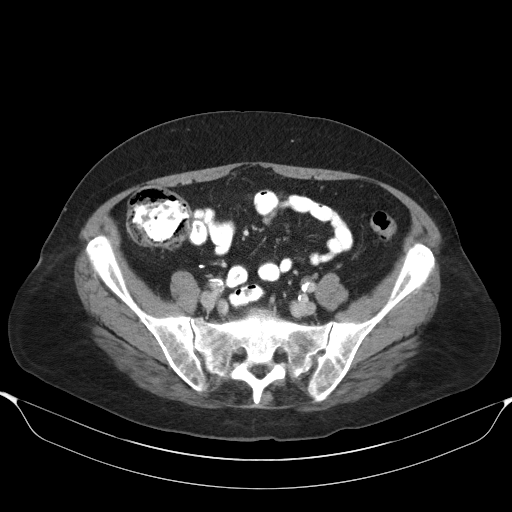
[im 41/96  soft-tissue]
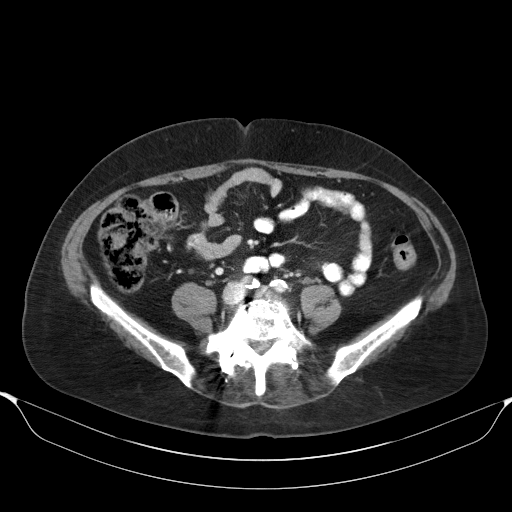
[im 48/96  soft-tissue]
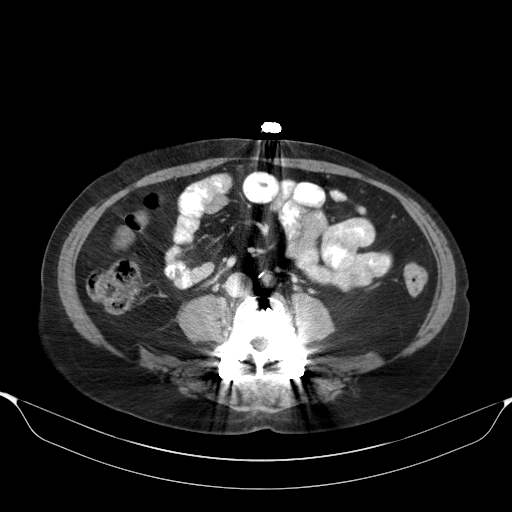
[im 55/96  soft-tissue]
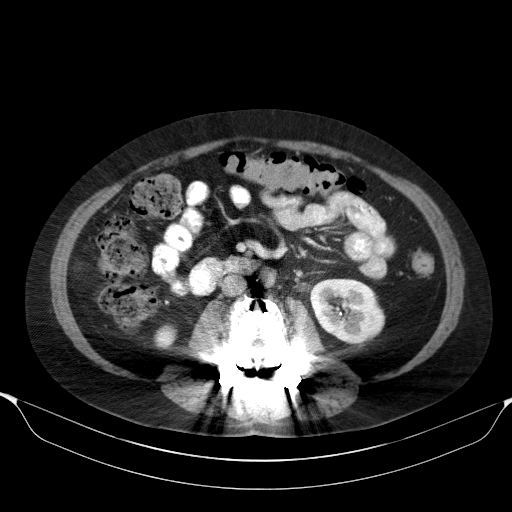
[im 62/96  soft-tissue]
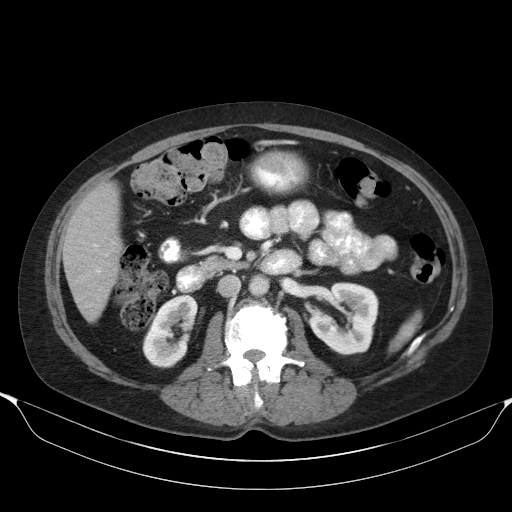
[im 62/96  bone]
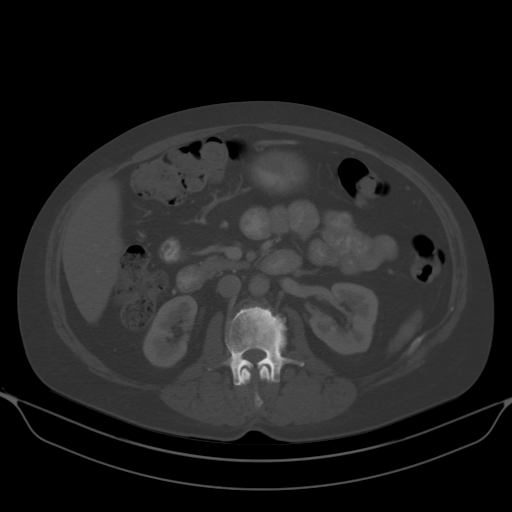
[im 68/96  soft-tissue]
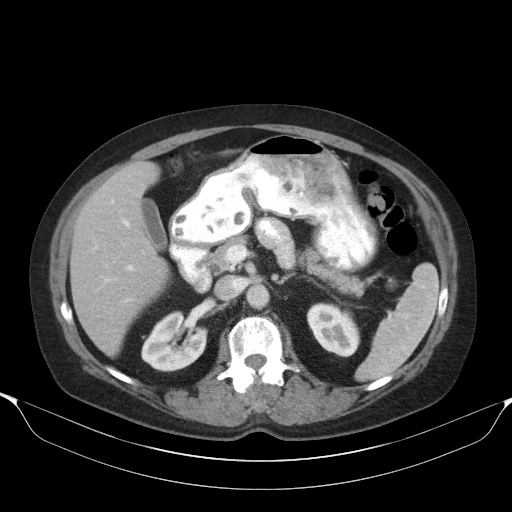
[im 68/96  lung]
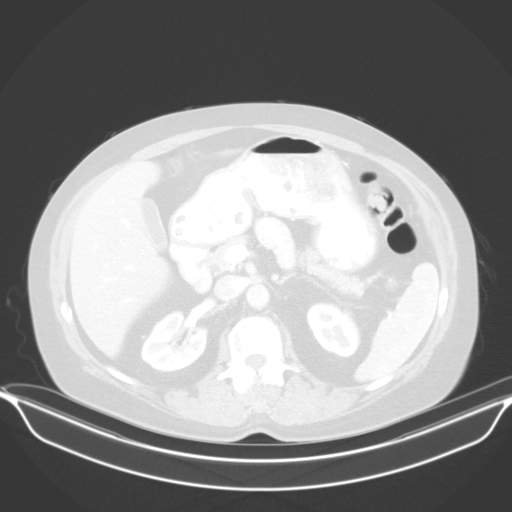
[im 75/96  soft-tissue]
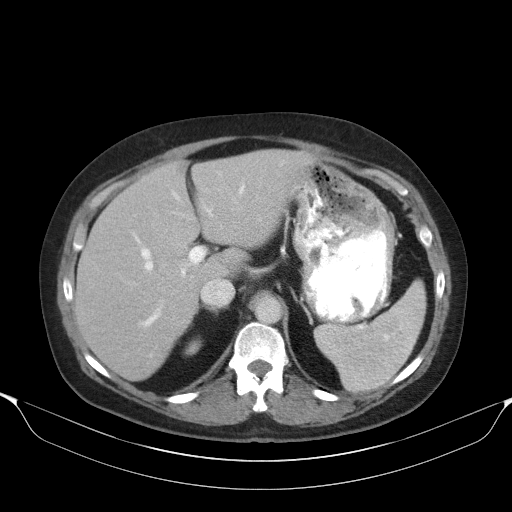
[im 75/96  lung]
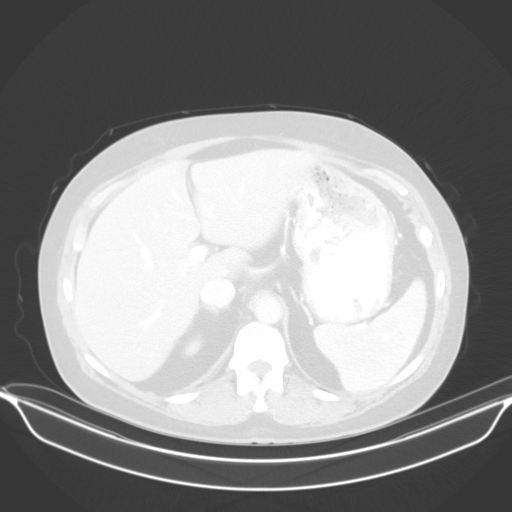
[im 82/96  soft-tissue]
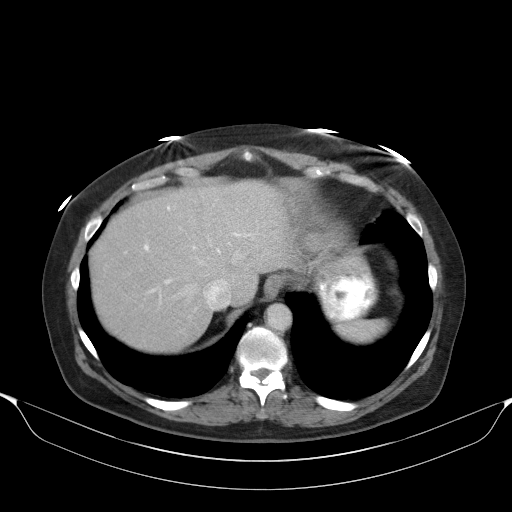
[im 82/96  lung]
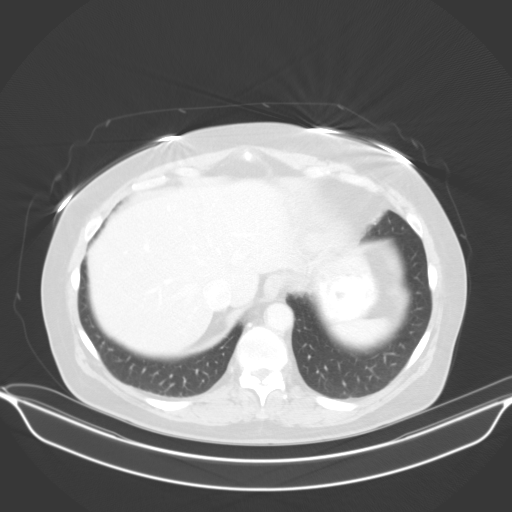
[im 89/96  soft-tissue]
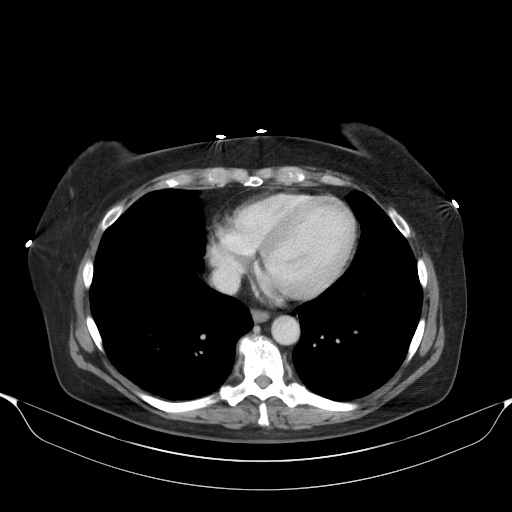
[im 89/96  lung]
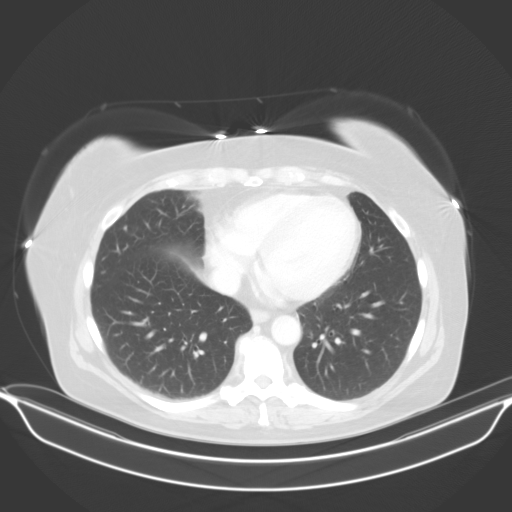

[13 of 32 positions shown; findings below may reference images not displayed]

FINDINGS: Lower chest: Lung bases are clear. No effusions. Heart is normal
size.

Hepatobiliary: No suspicious focal hepatic abnormality. Small
low-density area in the posterior right hepatic lobe is stable, most
likely cyst. Gallbladder unremarkable.

Pancreas: No focal abnormality or ductal dilatation.

Spleen: No focal abnormality.  Normal size.

Adrenals/Urinary Tract: Punctate nonobstructing stones in the lower
pole of the left kidney. No ureteral stones or hydronephrosis
bilaterally. No renal or adrenal mass. Urinary bladder unremarkable.

Stomach/Bowel: Prior appendectomy. Stomach, large and small bowel
grossly unremarkable.

Vascular/Lymphatic: Aortic atherosclerosis. No evidence of aneurysm
or adenopathy.

Reproductive: Prior hysterectomy.  No adnexal masses.

Other: No free fluid or free air.

Musculoskeletal: No acute bony abnormality. Postoperative changes in
the lumbar spine.
IMPRESSION: Left lower pole nephrolithiasis.  No hydronephrosis.

Aortic atherosclerosis.

No acute findings.

## 2021-02-28 ENCOUNTER — Encounter: Payer: Self-pay | Admitting: *Deleted

## 2021-03-01 ENCOUNTER — Ambulatory Visit (INDEPENDENT_AMBULATORY_CARE_PROVIDER_SITE_OTHER): Payer: 59 | Admitting: Cardiology

## 2021-03-01 ENCOUNTER — Encounter: Payer: Self-pay | Admitting: Cardiology

## 2021-03-01 VITALS — BP 120/80 | HR 120 | Ht 62.0 in | Wt 161.8 lb

## 2021-03-01 DIAGNOSIS — R0789 Other chest pain: Secondary | ICD-10-CM

## 2021-03-01 DIAGNOSIS — I428 Other cardiomyopathies: Secondary | ICD-10-CM

## 2021-03-01 DIAGNOSIS — I10 Essential (primary) hypertension: Secondary | ICD-10-CM | POA: Diagnosis not present

## 2021-03-01 DIAGNOSIS — R000 Tachycardia, unspecified: Secondary | ICD-10-CM | POA: Diagnosis not present

## 2021-03-01 MED ORDER — NICOTINE 21 MG/24HR TD PT24: 21.0000 mg | MEDICATED_PATCH | Freq: Every day | TRANSDERMAL | 2 refills | Status: DC

## 2021-03-01 MED ORDER — NICOTINE 7 MG/24HR TD PT24: 7.0000 mg | MEDICATED_PATCH | Freq: Every day | TRANSDERMAL | 0 refills | Status: DC

## 2021-03-01 MED ORDER — NICOTINE 14 MG/24HR TD PT24: 14.0000 mg | MEDICATED_PATCH | Freq: Every day | TRANSDERMAL | 0 refills | Status: DC

## 2021-03-01 NOTE — Progress Notes (Signed)
Clinical Summary Ms. Subia is a 60 y.o.female seen today for follow up of the following medical problems.    1. NICM - 07/2016 echo LVEF 40-45% - 01/2017 LVEF 50-55% - Cardiac catheterization performed on 08/08/2016 showed 10% RCA, normal LVEDP, EF 40%. No significant coronary artery disease seen. - medical therapy limited by soft bp's     06/2019 echo: LVEF 50-55% - no recent symptoms  2. Chest pain Left sided stabbing left sided. Can occur at any time. Can go into left arm. Can last up to 24 hours without relief. Not positional  12/2019 nuclear stress: apical thinning, cannot completely exclude very mild apical ischemia.  - no recent chest pains    3. Hyperliidemia -11/2018 collected 6pm, likely not fasting.   02/2020 TC 165 TG 336 HDL 32 LDL 85 - reports high soda intake, sweets  4. Sinus tachycardia - some recent diarrhea, 1-2 loose BMs per day for a few months. Some dry heaving at times.  - poor oral hydration. Mountain dew bottles x 4-6 bottles. - chronic back pain, typically 4-8/10 in severity.        SH: mother is Tempie Hoist, also a pateint of mine.    Past Medical History:  Diagnosis Date   Anxiety    Asthma    daily inhaler   Bipolar disorder ([place])    Bronchitis due to tobacco use 07/2016   Carpal tunnel syndrome of right wrist 12/2012   CHF (congestive heart failure) (HCC)    Chronic back pain greater than 3 months duration    Depression    Full dentures    GERD (gastroesophageal reflux disease)    Headache(784.0)    1-2 x/month   History of ankle sprain    right; 2 weeks ago;  c/o severe pain   Hypertension    Seizures ([place])    states "silent seizures"; is on anticonvulsant; none in 2-3 mos.     Allergies  Allergen Reactions   Apple Anaphylaxis and Shortness Of Breath   Penicillins Anaphylaxis, Shortness Of Breath, Itching and Swelling    Has patient had a PCN reaction causing immediate rash, facial/tongue/throat swelling, SOB or  lightheadedness with hypotension: Unknown, childhood reaction Has patient had a PCN reaction causing severe rash involving mucus membranes or skin necrosis: No Has patient had a PCN reaction that required hospitalization No Has patient had a PCN reaction occurring within the last 10 years: No If all of the above answers are "NO", then may proceed with Cephalosporin use.    Bee Venom Swelling    Takes benadryl to help with reaction     Current Outpatient Medications  Medication Sig Dispense Refill   ASMANEX, 60 METERED DOSES, 220 MCG/INH inhaler Inhale 1 puff into the lungs 2 (two) times daily.     aspirin EC 81 MG tablet Take 81 mg by mouth daily.     beclomethasone (QVAR) 80 MCG/ACT inhaler Inhale 1 puff into the lungs 2 (two) times daily.      clonazePAM (KLONOPIN) 0.5 MG tablet Take 0.5 mg by mouth 2 (two) times daily.     diphenhydrAMINE (BENADRYL) 25 MG tablet Take 1 tablet (25 mg total) by mouth every 6 (six) hours as needed for itching. 30 tablet 0   esomeprazole (NEXIUM) 40 MG capsule Take 40 mg by mouth daily at 12 noon.     gabapentin (NEURONTIN) 300 MG capsule Take 300 mg by mouth 3 (three) times daily.     HYDROcodone-acetaminophen (  NORCO) 10-325 MG tablet Take 1 tablet by mouth 4 (four) times daily.     ibuprofen (ADVIL) 600 MG tablet Take 1 tablet (600 mg total) by mouth every 6 (six) hours as needed. 30 tablet 0   INGREZZA 80 MG CAPS Take 1 capsule by mouth at bedtime.     LATUDA 60 MG TABS Take 1 tablet by mouth at bedtime.     losartan (COZAAR) 25 MG tablet Take 25 mg by mouth daily.     MYRBETRIQ 50 MG TB24 tablet Take 50 mg by mouth daily. (Patient not taking: Reported on 02/04/2020)     nitroGLYCERIN (NITROSTAT) 0.4 MG SL tablet PLACE 1 TAB UNDER TONGUE EVERY 3 MINUTES AS DIRECTED UP TO 3 TIMES AS NEEDED FOR CHEST PAIN. 25 tablet 3   omega-3 acid ethyl esters (LOVAZA) 1 g capsule Take 1 capsule (1 g total) by mouth 2 (two) times daily. (Patient not taking: Reported on  02/04/2020) 60 capsule 0   prazosin (MINIPRESS) 1 MG capsule Take 1 mg by mouth at bedtime.     PROAIR HFA 108 (90 Base) MCG/ACT inhaler INHALE 2 PUFFS INTO THE LUNGS EVERY 4 HOURS AS NEEDED FOR WHEEZING ORSHORTNESS OF BREATH. (Patient taking differently: Inhale 1-2 puffs into the lungs every 6 (six) hours as needed for wheezing or shortness of breath. ) 8.5 g 0   tiZANidine (ZANAFLEX) 2 MG tablet Take 2 mg by mouth 3 (three) times daily.     traZODone (DESYREL) 100 MG tablet Take 100 mg by mouth at bedtime.     No current facility-administered medications for this visit.     Past Surgical History:  Procedure Laterality Date   ABDOMINAL HYSTERECTOMY     partial   APPENDECTOMY     BUNIONECTOMY Left    CARPAL TUNNEL RELEASE Right 01/13/2013   Procedure: CARPAL TUNNEL RELEASE;  Surgeon: Cammie Sickle., MD;  Location: [facility];  Service: Orthopedics;  Laterality: Right;   CERVICAL FUSION     x 2   DIAGNOSTIC LAPAROSCOPY  11/08/2006   INGUINAL HERNIA REPAIR Right    LAPAROSCOPIC APPENDECTOMY  11/08/2006   LEFT HEART CATH AND CORONARY ANGIOGRAPHY N/A 08/08/2016   Procedure: Left Heart Cath and Coronary Angiography;  Surgeon: Wellington Hampshire, MD;  Location: [facility];  Service: Cardiovascular;  Laterality: N/A;   LUMBAR FUSION  06/28/2014   L2-5   LUMBAR LAMINECTOMY  02/05/2007   L4-5 fusion   TUBAL LIGATION       Allergies  Allergen Reactions   Apple Anaphylaxis and Shortness Of Breath   Penicillins Anaphylaxis, Shortness Of Breath, Itching and Swelling    Has patient had a PCN reaction causing immediate rash, facial/tongue/throat swelling, SOB or lightheadedness with hypotension: Unknown, childhood reaction Has patient had a PCN reaction causing severe rash involving mucus membranes or skin necrosis: No Has patient had a PCN reaction that required hospitalization No Has patient had a PCN reaction occurring within the last 10 years: No If all of the above  answers are "NO", then may proceed with Cephalosporin use.    Bee Venom Swelling    Takes benadryl to help with reaction      Family History  Problem Relation Age of Onset   Lung cancer Father    Diabetes Maternal Grandmother    Heart attack Maternal Grandmother    Deep vein thrombosis Neg Hx      Social History Ms. Deloney reports that she has been smoking  cigarettes. She started smoking about 46 years ago. She has a 20.00 pack-year smoking history. She has never used smokeless tobacco. Ms. Pala reports no history of alcohol use.   Review of Systems CONSTITUTIONAL: No weight loss, fever, chills, weakness or fatigue.  HEENT: Eyes: No visual loss, blurred vision, double vision or yellow sclerae.No hearing loss, sneezing, congestion, runny nose or sore throat.  SKIN: No rash or itching.  CARDIOVASCULAR: per hpi RESPIRATORY: No shortness of breath, cough or sputum.  GASTROINTESTINAL: No anorexia, nausea, vomiting or diarrhea. No abdominal pain or blood.  GENITOURINARY: No burning on urination, no polyuria NEUROLOGICAL: No headache, dizziness, syncope, paralysis, ataxia, numbness or tingling in the extremities. No change in bowel or bladder control.  MUSCULOSKELETAL: No muscle, back pain, joint pain or stiffness.  LYMPHATICS: No enlarged nodes. No history of splenectomy.  PSYCHIATRIC: No history of depression or anxiety.  ENDOCRINOLOGIC: No reports of sweating, cold or heat intolerance. No polyuria or polydipsia.  Marland Kitchen   Physical Examination Today's Vitals   03/01/21 1047  BP: 120/80  Pulse: (!) 120  SpO2: 97%  Weight: 161 lb 12.8 oz (73.4 kg)  Height: 5\' 2"  (1.575 m)   Body mass index is 29.59 kg/m.  Gen: resting comfortably, no acute distress HEENT: no scleral icterus, pupils equal round and reactive, no palptable cervical adenopathy,  CV: regular, tachy, no m/r/g no jvd Resp: Clear to auscultation bilaterally GI: abdomen is soft, non-tender, non-distended, normal  bowel sounds, no hepatosplenomegaly MSK: extremities are warm, no edema.  Skin: warm, no rash Neuro:  no focal deficits Psych: appropriate affect   Diagnostic Studies Echo 08/06/2016 LV EF: 40% -   45%   Study Conclusions   - Left ventricle: The cavity size was normal. Wall thickness was   normal. Systolic function was mildly to moderately reduced. The   estimated ejection fraction was in the range of 40% to 45%.   Diffuse hypokinesis. - Mitral valve: There was mild regurgitation. - Pulmonary arteries: Systolic pressure was mildly increased. PA   peak pressure: 31 mm Hg (S).         Cath 08/08/2016 Conclusion      Mid RCA lesion, 10 %stenosed. LV end diastolic pressure is normal. There is mild to moderate left ventricular systolic dysfunction. There is no mitral valve regurgitation.   1. Near normal coronary arteries with no evidence of obstructive disease. 2. Mild to moderately reduced LV systolic function with an ejection fraction of 40% with global hypokinesis. 3. Normal left ventricular end-diastolic pressure.   Recommendations: Continue medical therapy for nonischemic cardiomyopathy. The patient does not require further intravenous diuresis and I switched her to small dose oral furosemide. Right heart catheterization was planned. However, it could not be done via the right antecubital area due to cellulitis and the wire could not be advanced via the left antecubital vein. Given that her LVEDP was normal, I elected not to pursue a right heart catheterization. There was no evidence of pulmonary hypertension on echo.    06/2019 echo IMPRESSIONS     1. Left ventricular ejection fraction, by estimation, is 50 to 55%. The  left ventricle has low normal function. The left ventrical has no regional  wall motion abnormalities. Left ventricular diastolic parameters were  normal.   2. Right ventricular systolic function is normal. The right ventricular  size is normal. There  is normal pulmonary artery systolic pressure. The  estimated right ventricular systolic pressure is 70.6 mmHg.   3. Left atrial size  was upper normal.   4. The mitral valve is grossly normal. Mild mitral valve regurgitation.   5. The aortic valve is tricuspid. Aortic valve regurgitation is not  visualized.   6. The inferior vena cava is normal in size with greater than 50%  respiratory variability, suggesting right atrial pressure of 3 mmHg.    12/2019 nuclear stress There was no ST segment deviation noted during stress. This is a low risk study. The left ventricular ejection fraction is low normal (50%). Small mild apical defect likely due to slight differences in apical thinning. Cannot completely exclude small apical infarct with very mild peri-infarct ischemia. Either finding would support low risk.  Assessment and Plan  1. History of NICM - prior mild LV dysfunction, LVEF has nomralized - no recent symptoms, continue tom onitor   2. Chest pain - intermittent chronic atypical chest pain - no recent symptoms, prior stress test low risk. Continue to monitor  3. Sinus tachcyardia - EKG today sinsu tach 120 - unclear etiology. She reports some recent diarrhea, poor oral hydration and high caffeine intake. Also with chronic lower back pain. No other acute symptoms, O2sats are normal here today she is sitting comfortable in no distress. - check CMET/CBC/TSH - encouraged increased oral hydration, particularly during times of fluid loss, wean caffeine.       Arnoldo Lenis, M.D

## 2021-03-01 NOTE — Patient Instructions (Addendum)
Medication Instructions:  Your physician has recommended you make the following change in your medication: Start Nicotine patch 21 mg daily x 6 weeks, then 14mg  daily x 2 weeks, then 7mg  daily x 2 weeks then stop Continue other medications the same  Labwork: CMET, CBC & TSH-can be done at [facility] today  Testing/Procedures: none  Follow-Up: Your physician recommends that you schedule a follow-up appointment in: 6 months  Any Other Special Instructions Will Be Listed Below (If Applicable).  If you need a refill on your cardiac medications before your next appointment, please call your pharmacy.

## 2021-03-06 ENCOUNTER — Telehealth: Payer: Self-pay | Admitting: Cardiology

## 2021-03-10 ENCOUNTER — Other Ambulatory Visit (HOSPITAL_COMMUNITY)
Admission: RE | Admit: 2021-03-10 | Discharge: 2021-03-10 | Disposition: A | Discharge status: Home / Self Care | Payer: 59 | Source: Ambulatory Visit | Attending: Cardiology | Admitting: Cardiology

## 2021-03-10 ENCOUNTER — Other Ambulatory Visit: Payer: Self-pay

## 2021-03-10 DIAGNOSIS — R000 Tachycardia, unspecified: Secondary | ICD-10-CM | POA: Diagnosis present

## 2021-03-10 LAB — CBC
HCT: 42.6 % (ref 36.0–46.0)
Hemoglobin: 14.2 g/dL (ref 12.0–15.0)
MCH: 28.3 pg (ref 26.0–34.0)
MCHC: 33.3 g/dL (ref 30.0–36.0)
MCV: 85 fL (ref 80.0–100.0)
Platelets: 209 10*3/uL (ref 150–400)
RBC: 5.01 MIL/uL (ref 3.87–5.11)
RDW: 14.7 % (ref 11.5–15.5)
WBC: 10.1 10*3/uL (ref 4.0–10.5)
nRBC: 0 % (ref 0.0–0.2)

## 2021-03-10 LAB — COMPREHENSIVE METABOLIC PANEL
ALT: 13 U/L (ref 0–44)
AST: 15 U/L (ref 15–41)
Albumin: 3.7 g/dL (ref 3.5–5.0)
Alkaline Phosphatase: 113 U/L (ref 38–126)
Anion gap: 9 (ref 5–15)
BUN: 12 mg/dL (ref 6–20)
CO2: 30 mmol/L (ref 22–32)
Calcium: 8.9 mg/dL (ref 8.9–10.3)
Chloride: 102 mmol/L (ref 98–111)
Creatinine, Ser: 0.88 mg/dL (ref 0.44–1.00)
GFR, Estimated: >60 mL/min (ref >60)
Glucose, Bld: 120 mg/dL — ABNORMAL HIGH (ref 70–99)
Potassium: 3.4 mmol/L — ABNORMAL LOW (ref 3.5–5.1)
Sodium: 141 mmol/L (ref 135–145)
Total Bilirubin: 0.4 mg/dL (ref 0.3–1.2)
Total Protein: 6.7 g/dL (ref 6.5–8.1)

## 2021-03-10 LAB — TSH: TSH: 1.174 u[IU]/mL (ref 0.350–4.500)

## 2021-03-21 ENCOUNTER — Telehealth: Payer: Self-pay | Admitting: *Deleted

## 2021-03-21 NOTE — Telephone Encounter (Signed)
-----   Message from Arnoldo Lenis, MD sent at 03/20/2021  4:17 PM EDT ----- Labs overall look good. Potassium is just a little low. Can she take potassium chloride 40mg  daily x 3 days   Carlyle Dolly MD

## 2021-03-21 NOTE — Telephone Encounter (Signed)
KAWANNA CHRISTLEY, LPN  23/36/1224  4:97 PM EDT Back to Top    Notified, copy to pcp.

## 2021-11-10 ENCOUNTER — Ambulatory Visit (INDEPENDENT_AMBULATORY_CARE_PROVIDER_SITE_OTHER): Payer: 59 | Admitting: Cardiology

## 2021-11-10 ENCOUNTER — Encounter: Payer: Self-pay | Admitting: Cardiology

## 2021-11-10 VITALS — BP 140/90 | HR 125 | Ht 62.0 in | Wt 174.6 lb

## 2021-11-10 DIAGNOSIS — Z79899 Other long term (current) drug therapy: Secondary | ICD-10-CM

## 2021-11-10 DIAGNOSIS — R0602 Shortness of breath: Secondary | ICD-10-CM | POA: Diagnosis not present

## 2021-11-10 DIAGNOSIS — R0609 Other forms of dyspnea: Secondary | ICD-10-CM | POA: Diagnosis not present

## 2021-11-10 DIAGNOSIS — I10 Essential (primary) hypertension: Secondary | ICD-10-CM | POA: Diagnosis not present

## 2021-11-10 DIAGNOSIS — I428 Other cardiomyopathies: Secondary | ICD-10-CM | POA: Diagnosis not present

## 2021-11-10 DIAGNOSIS — R002 Palpitations: Secondary | ICD-10-CM

## 2021-11-10 DIAGNOSIS — E782 Mixed hyperlipidemia: Secondary | ICD-10-CM

## 2021-11-10 MED ORDER — PREDNISONE 20 MG PO TABS: 40.0000 mg | ORAL_TABLET | Freq: Every day | ORAL | 0 refills | Status: AC

## 2021-11-10 MED ORDER — AZITHROMYCIN 250 MG PO TABS: ORAL_TABLET | ORAL | 0 refills | Status: DC

## 2021-11-10 NOTE — Progress Notes (Signed)
Clinical Summary [name] is a 61 y.o.female seen today for follow up of the following medical problems.    1. NICM - 07/2016 echo LVEF 40-45% - 01/2017 LVEF 50-55% - Cardiac catheterization performed on 08/08/2016 showed 10% RCA, normal LVEDP, EF 40%. No significant coronary artery disease seen. - medical therapy limited by soft bp's     06/2019 echo: LVEF 50-55% - recent SOB x3-4 months. SOB at rest, worst with activity. DOE walking aisles at [employer], +wheezing. Productive cough yellowish. Mild LE edema at times at night, feels like abdomen distended - chronic orthopnea - slight chest pains infrequent - still smoking - uses albuterol 3 times a day, with some improvement.   up to 13 lbs since visit last year    2. Chest pain Left sided stabbing left sided. Can occur at any time. Can go into left arm. Can last up to 24 hours without relief. Not positional   12/2019 nuclear stress: apical thinning, cannot completely exclude very mild apical ischemia.  - no recent chest pains     3. Hyperliidemia -11/2018 collected 6pm, likely not fasting.    02/2020 TC 165 TG 336 HDL 32 LDL 85 - reports high soda intake, sweets   4. Sinus tachycardia - some recent diarrhea, 1-2 loose BMs per day for a few months. Some dry heaving at times.  - poor oral hydration. Mountain dew bottles x 4-6 bottles. - chronic back pain, typically 4-8/10 in severity.     5. COPD - remote PFTs - only on prn albuterol     SH: mother is Darlene Wilcox, also a pateint of mine.    Past Medical History:  Diagnosis Date   Anxiety    Asthma    daily inhaler   Bipolar disorder ([place])    Bronchitis due to tobacco use 07/2016   Carpal tunnel syndrome of right wrist 12/2012   CHF (congestive heart failure) (HCC)    Chronic back pain greater than 3 months duration    Depression    Full dentures    GERD (gastroesophageal reflux disease)    Headache(784.0)    1-2 x/month   History of ankle  sprain    right; 2 weeks ago;  c/o severe pain   Hypertension    Seizures ([place])    states "silent seizures"; is on anticonvulsant; none in 2-3 mos.     Allergies  Allergen Reactions   Apple Juice Anaphylaxis and Shortness Of Breath   Penicillins Anaphylaxis, Shortness Of Breath, Itching and Swelling    Has patient had a PCN reaction causing immediate rash, facial/tongue/throat swelling, SOB or lightheadedness with hypotension: Unknown, childhood reaction Has patient had a PCN reaction causing severe rash involving mucus membranes or skin necrosis: No Has patient had a PCN reaction that required hospitalization No Has patient had a PCN reaction occurring within the last 10 years: No If all of the above answers are "NO", then may proceed with Cephalosporin use.    Bee Venom Swelling    Takes benadryl to help with reaction     Current Outpatient Medications  Medication Sig Dispense Refill   ASMANEX, 60 METERED DOSES, 220 MCG/INH inhaler Inhale 1 puff into the lungs 2 (two) times daily.     aspirin EC 81 MG tablet Take 81 mg by mouth daily.     beclomethasone (QVAR) 80 MCG/ACT inhaler Inhale 1 puff into the lungs 2 (two) times daily.      benztropine (COGENTIN) 1  MG tablet Take 1 mg by mouth daily.     clonazePAM (KLONOPIN) 0.5 MG tablet Take 0.5 mg by mouth 2 (two) times daily.     cyclobenzaprine (FLEXERIL) 10 MG tablet Take 10 mg by mouth 2 (two) times daily as needed.     diphenhydrAMINE (BENADRYL) 25 MG tablet Take 1 tablet (25 mg total) by mouth every 6 (six) hours as needed for itching. 30 tablet 0   esomeprazole (NEXIUM) 40 MG capsule Take 40 mg by mouth daily at 12 noon.     gabapentin (NEURONTIN) 300 MG capsule Take 300 mg by mouth 3 (three) times daily.     ibuprofen (ADVIL) 600 MG tablet Take 1 tablet (600 mg total) by mouth every 6 (six) hours as needed. 30 tablet 0   INGREZZA 80 MG CAPS Take 1 capsule by mouth at bedtime.     LATUDA 60 MG TABS Take 1 tablet by mouth at  bedtime.     losartan (COZAAR) 25 MG tablet Take 25 mg by mouth daily.     MYRBETRIQ 50 MG TB24 tablet Take 50 mg by mouth daily.     nicotine (NICODERM CQ - DOSED IN MG/24 HOURS) 14 mg/24hr patch Place 1 patch (14 mg total) onto the skin daily. For 2 weeks, then reduce to 7 mg 14 patch 0   nicotine (NICODERM CQ - DOSED IN MG/24 HOURS) 21 mg/24hr patch Place 1 patch (21 mg total) onto the skin daily. For 6 weeks, then reduce to 14 mg daily for 2 weeks 28 patch 2   nicotine (NICODERM CQ - DOSED IN MG/24 HR) 7 mg/24hr patch Place 1 patch (7 mg total) onto the skin daily. For 2 weeks, then STOP 14 patch 0   nitroGLYCERIN (NITROSTAT) 0.4 MG SL tablet PLACE 1 TAB UNDER TONGUE EVERY 3 MINUTES AS DIRECTED UP TO 3 TIMES AS NEEDED FOR CHEST PAIN. 25 tablet 3   omega-3 acid ethyl esters (LOVAZA) 1 g capsule Take 1 capsule (1 g total) by mouth 2 (two) times daily. 60 capsule 0   oxyCODONE-acetaminophen (PERCOCET) 10-325 MG tablet Take 1 tablet by mouth 4 (four) times daily as needed.     prazosin (MINIPRESS) 1 MG capsule Take 1 mg by mouth at bedtime.     PROAIR HFA 108 (90 Base) MCG/ACT inhaler INHALE 2 PUFFS INTO THE LUNGS EVERY 4 HOURS AS NEEDED FOR WHEEZING ORSHORTNESS OF BREATH. (Patient taking differently: Inhale 1-2 puffs into the lungs every 6 (six) hours as needed for wheezing or shortness of breath.) 8.5 g 0   traZODone (DESYREL) 100 MG tablet Take 100 mg by mouth at bedtime.     No current facility-administered medications for this visit.     Past Surgical History:  Procedure Laterality Date   ABDOMINAL HYSTERECTOMY     partial   APPENDECTOMY     BUNIONECTOMY Left    CARPAL TUNNEL RELEASE Right 01/13/2013   Procedure: CARPAL TUNNEL RELEASE;  Surgeon: Cammie Sickle., MD;  Location: [facility];  Service: Orthopedics;  Laterality: Right;   CERVICAL FUSION     x 2   DIAGNOSTIC LAPAROSCOPY  11/08/2006   INGUINAL HERNIA REPAIR Right    LAPAROSCOPIC APPENDECTOMY  11/08/2006    LEFT HEART CATH AND CORONARY ANGIOGRAPHY N/A 08/08/2016   Procedure: Left Heart Cath and Coronary Angiography;  Surgeon: Wellington Hampshire, MD;  Location: [facility];  Service: Cardiovascular;  Laterality: N/A;   LUMBAR FUSION  06/28/2014  L2-5   LUMBAR LAMINECTOMY  02/05/2007   L4-5 fusion   TUBAL LIGATION       Allergies  Allergen Reactions   Apple Juice Anaphylaxis and Shortness Of Breath   Penicillins Anaphylaxis, Shortness Of Breath, Itching and Swelling    Has patient had a PCN reaction causing immediate rash, facial/tongue/throat swelling, SOB or lightheadedness with hypotension: Unknown, childhood reaction Has patient had a PCN reaction causing severe rash involving mucus membranes or skin necrosis: No Has patient had a PCN reaction that required hospitalization No Has patient had a PCN reaction occurring within the last 10 years: No If all of the above answers are "NO", then may proceed with Cephalosporin use.    Bee Venom Swelling    Takes benadryl to help with reaction      Family History  Problem Relation Age of Onset   Lung cancer Father    Diabetes Maternal Grandmother    Heart attack Maternal Grandmother    Deep vein thrombosis Neg Hx      Social History Darlene Wilcox reports that she has been smoking cigarettes. She started smoking about 47 years ago. She has a 20.00 pack-year smoking history. She has never used smokeless tobacco. Darlene Wilcox reports no history of alcohol use.   Review of Systems CONSTITUTIONAL: No weight loss, fever, chills, weakness or fatigue.  HEENT: Eyes: No visual loss, blurred vision, double vision or yellow sclerae.No hearing loss, sneezing, congestion, runny nose or sore throat.  SKIN: No rash or itching.  CARDIOVASCULAR: per hpi  RESPIRATORY: per hpi GASTROINTESTINAL: No anorexia, nausea, vomiting or diarrhea. No abdominal pain or blood.  GENITOURINARY: No burning on urination, no polyuria NEUROLOGICAL: No headache,  dizziness, syncope, paralysis, ataxia, numbness or tingling in the extremities. No change in bowel or bladder control.  MUSCULOSKELETAL: No muscle, back pain, joint pain or stiffness.  LYMPHATICS: No enlarged nodes. No history of splenectomy.  PSYCHIATRIC: No history of depression or anxiety.  ENDOCRINOLOGIC: No reports of sweating, cold or heat intolerance. No polyuria or polydipsia.  Marland Kitchen   Physical Examination Today's Vitals   11/10/21 1007  BP: 140/90  Pulse: (!) 125  SpO2: 98%  Weight: 174 lb 9.6 oz (79.2 kg)  Height: '5\' 2"'$  (1.575 m)   Body mass index is 31.93 kg/m.  Gen: resting comfortably, no acute distress HEENT: no scleral icterus, pupils equal round and reactive, no palptable cervical adenopathy,  CV: regular, sinus tach, no m/r/g no jvd Resp: Clear to auscultation bilaterally GI: abdomen is soft, non-tender, non-distended, normal bowel sounds, no hepatosplenomegaly MSK: extremities are warm, no edema.  Skin: warm, no rash Neuro:  no focal deficits Psych: appropriate affect   Diagnostic Studies  Echo 08/06/2016 LV EF: 40% -   45%   Study Conclusions   - Left ventricle: The cavity size was normal. Wall thickness was   normal. Systolic function was mildly to moderately reduced. The   estimated ejection fraction was in the range of 40% to 45%.   Diffuse hypokinesis. - Mitral valve: There was mild regurgitation. - Pulmonary arteries: Systolic pressure was mildly increased. PA   peak pressure: 31 mm Hg (S).         Cath 08/08/2016 Conclusion      Mid RCA lesion, 10 %stenosed. LV end diastolic pressure is normal. There is mild to moderate left ventricular systolic dysfunction. There is no mitral valve regurgitation.   1. Near normal coronary arteries with no evidence of obstructive disease. 2. Mild to moderately reduced  LV systolic function with an ejection fraction of 40% with global hypokinesis. 3. Normal left ventricular end-diastolic pressure.    Recommendations: Continue medical therapy for nonischemic cardiomyopathy. The patient does not require further intravenous diuresis and I switched her to small dose oral furosemide. Right heart catheterization was planned. However, it could not be done via the right antecubital area due to cellulitis and the wire could not be advanced via the left antecubital vein. Given that her LVEDP was normal, I elected not to pursue a right heart catheterization. There was no evidence of pulmonary hypertension on echo.      06/2019 echo IMPRESSIONS     1. Left ventricular ejection fraction, by estimation, is 50 to 55%. The  left ventricle has low normal function. The left ventrical has no regional  wall motion abnormalities. Left ventricular diastolic parameters were  normal.   2. Right ventricular systolic function is normal. The right ventricular  size is normal. There is normal pulmonary artery systolic pressure. The  estimated right ventricular systolic pressure is 57.8 mmHg.   3. Left atrial size was upper normal.   4. The mitral valve is grossly normal. Mild mitral valve regurgitation.   5. The aortic valve is tricuspid. Aortic valve regurgitation is not  visualized.   6. The inferior vena cava is normal in size with greater than 50%  respiratory variability, suggesting right atrial pressure of 3 mmHg.      12/2019 nuclear stress There was no ST segment deviation noted during stress. This is a low risk study. The left ventricular ejection fraction is low normal (50%). Small mild apical defect likely due to slight differences in apical thinning. Cannot completely exclude small apical infarct with very mild peri-infarct ischemia. Either finding would support low risk.    Assessment and Plan   1. History of NICM - prior mild LV dysfunction, LVEF has nomralized - recent SOB, weight gain, abdominal distension. No LE edema or JVD, lungs sound clear - will repeat echo, check bnp.    2.  SOB/DOE - suspect most likely related to COPD - check PFTs to confirm diagnosis and severity - give 5 day course of prednisone, azithromycin     3. Sinus tachcyardia - EKG today sinsu tach 120 - unclear etiology. She reports some recent diarrhea, poor oral hydration and high caffeine intake. Also with chronic lower back pain.  - overall chronic sinus tach   F/u 3 months      Arnoldo Lenis, M.D.

## 2021-11-10 NOTE — Patient Instructions (Addendum)
Medication Instructions:  Begin Prednisone '40mg'$  daily x 5 days only Begin Z-pack - as directed  Continue all other medications.     Labwork: CMET, CBC, BNP, FLP, TSH, VIT B12 - orders given today  Reminder:  Nothing to eat or drink after 12 midnight prior to labs.  Testing/Procedures: Your physician has requested that you have an echocardiogram. Echocardiography is a painless test that uses sound waves to create images of your heart. It provides your doctor with information about the size and shape of your heart and how well your heart's chambers and valves are working. This procedure takes approximately one hour. There are no restrictions for this procedure. Your physician has recommended that you have a pulmonary function test. Pulmonary Function Tests are a group of tests that measure how well air moves in and out of your lungs.   Follow-Up: Office will contact with results via phone or letter.    3 months   Any Other Special Instructions Will Be Listed Below (If Applicable).   If you need a refill on your cardiac medications before your next appointment, please call your pharmacy.

## 2021-11-17 ENCOUNTER — Other Ambulatory Visit (HOSPITAL_COMMUNITY)
Admission: RE | Admit: 2021-11-17 | Discharge: 2021-11-17 | Disposition: A | Discharge status: Home / Self Care | Payer: 59 | Attending: Cardiology | Admitting: Cardiology

## 2021-11-17 DIAGNOSIS — R0602 Shortness of breath: Secondary | ICD-10-CM | POA: Insufficient documentation

## 2021-11-17 DIAGNOSIS — I428 Other cardiomyopathies: Secondary | ICD-10-CM | POA: Diagnosis present

## 2021-11-17 DIAGNOSIS — I10 Essential (primary) hypertension: Secondary | ICD-10-CM | POA: Diagnosis not present

## 2021-11-17 DIAGNOSIS — E782 Mixed hyperlipidemia: Secondary | ICD-10-CM | POA: Insufficient documentation

## 2021-11-17 DIAGNOSIS — Z79899 Other long term (current) drug therapy: Secondary | ICD-10-CM | POA: Diagnosis not present

## 2021-11-17 LAB — CBC
HCT: 45.5 % (ref 36.0–46.0)
Hemoglobin: 15.2 g/dL — ABNORMAL HIGH (ref 12.0–15.0)
MCH: 29.5 pg (ref 26.0–34.0)
MCHC: 33.4 g/dL (ref 30.0–36.0)
MCV: 88.2 fL (ref 80.0–100.0)
Platelets: 187 10*3/uL (ref 150–400)
RBC: 5.16 MIL/uL — ABNORMAL HIGH (ref 3.87–5.11)
RDW: 13.3 % (ref 11.5–15.5)
WBC: 13.1 10*3/uL — ABNORMAL HIGH (ref 4.0–10.5)
nRBC: 0 % (ref 0.0–0.2)

## 2021-11-17 LAB — LIPID PANEL
Cholesterol: 236 mg/dL — ABNORMAL HIGH (ref 0–200)
HDL: 40 mg/dL — ABNORMAL LOW (ref >40)
LDL Cholesterol: 118 mg/dL — ABNORMAL HIGH (ref 0–99)
Total CHOL/HDL Ratio: 5.9 RATIO
Triglycerides: 391 mg/dL — ABNORMAL HIGH (ref <150)
VLDL: 78 mg/dL — ABNORMAL HIGH (ref 0–40)

## 2021-11-17 LAB — COMPREHENSIVE METABOLIC PANEL
ALT: 53 U/L — ABNORMAL HIGH (ref 0–44)
AST: 42 U/L — ABNORMAL HIGH (ref 15–41)
Albumin: 3.7 g/dL (ref 3.5–5.0)
Alkaline Phosphatase: 107 U/L (ref 38–126)
Anion gap: 5 (ref 5–15)
BUN: 13 mg/dL (ref 6–20)
CO2: 28 mmol/L (ref 22–32)
Calcium: 9.2 mg/dL (ref 8.9–10.3)
Chloride: 105 mmol/L (ref 98–111)
Creatinine, Ser: 0.81 mg/dL (ref 0.44–1.00)
GFR, Estimated: >60 mL/min (ref >60)
Glucose, Bld: 103 mg/dL — ABNORMAL HIGH (ref 70–99)
Potassium: 4.2 mmol/L (ref 3.5–5.1)
Sodium: 138 mmol/L (ref 135–145)
Total Bilirubin: 0.5 mg/dL (ref 0.3–1.2)
Total Protein: 6.6 g/dL (ref 6.5–8.1)

## 2021-11-17 LAB — TSH: TSH: 2.211 u[IU]/mL (ref 0.350–4.500)

## 2021-11-17 LAB — BRAIN NATRIURETIC PEPTIDE: B Natriuretic Peptide: 45 pg/mL (ref 0.0–100.0)

## 2021-11-17 LAB — VITAMIN B12: Vitamin B-12: 789 pg/mL (ref 180–914)

## 2021-11-22 ENCOUNTER — Telehealth: Payer: Self-pay | Admitting: Cardiology

## 2021-11-22 NOTE — Telephone Encounter (Signed)
Patient was returning call for results. Please advise °

## 2021-11-22 NOTE — Telephone Encounter (Signed)
-----   Message from Arnoldo Lenis, MD sent at 11/20/2021  1:24 PM EDT ----- Labs overall look good except cholesterol too high. Needs to aggressively cut back on sweets, sodas, fried foods.   Zandra Abts MD

## 2021-11-22 NOTE — Telephone Encounter (Signed)
Patient informed and verbalized understanding of plan. Copy sent to PCP 

## 2021-11-23 ENCOUNTER — Inpatient Hospital Stay (HOSPITAL_COMMUNITY): Admission: RE | Admit: 2021-11-23 | Payer: 59 | Source: Ambulatory Visit

## 2021-11-29 ENCOUNTER — Ambulatory Visit (INDEPENDENT_AMBULATORY_CARE_PROVIDER_SITE_OTHER): Payer: 59

## 2021-11-29 DIAGNOSIS — R0602 Shortness of breath: Secondary | ICD-10-CM | POA: Diagnosis not present

## 2021-11-29 LAB — ECHOCARDIOGRAM COMPLETE
Area-P 1/2: 3.91 cm2
Calc EF: 29.8 %
MV M vel: 4.49 m/s
MV Peak grad: 80.6 mmHg
S' Lateral: 4.85 cm
Single Plane A2C EF: 27.9 %
Single Plane A4C EF: 30.3 %

## 2021-12-04 ENCOUNTER — Ambulatory Visit (HOSPITAL_COMMUNITY)
Admission: RE | Admit: 2021-12-04 | Discharge: 2021-12-04 | Disposition: A | Discharge status: Home / Self Care | Payer: 59 | Source: Ambulatory Visit | Attending: Cardiology | Admitting: Cardiology

## 2021-12-04 DIAGNOSIS — R0602 Shortness of breath: Secondary | ICD-10-CM | POA: Insufficient documentation

## 2021-12-04 LAB — PULMONARY FUNCTION TEST
DL/VA % pred: 87 %
DL/VA: 3.76 ml/min/mmHg/L
DLCO cor % pred: 71 %
DLCO cor: 13.62 ml/min/mmHg
DLCO unc % pred: 75 %
DLCO unc: 14.32 ml/min/mmHg
FEF 25-75 Post: 2.29 L/sec
FEF 25-75 Pre: 2.75 L/sec
FEF2575-%Change-Post: -16 %
FEF2575-%Pred-Post: 100 %
FEF2575-%Pred-Pre: 120 %
FEV1-%Change-Post: -2 %
FEV1-%Pred-Post: 85 %
FEV1-%Pred-Pre: 87 %
FEV1-Post: 2.04 L
FEV1-Pre: 2.09 L
FEV1FVC-%Change-Post: -2 %
FEV1FVC-%Pred-Pre: 107 %
FEV6-%Change-Post: -1 %
FEV6-%Pred-Post: 81 %
FEV6-%Pred-Pre: 83 %
FEV6-Post: 2.44 L
FEV6-Pre: 2.48 L
FEV6FVC-%Change-Post: -1 %
FEV6FVC-%Pred-Post: 102 %
FEV6FVC-%Pred-Pre: 103 %
FVC-%Change-Post: 0 %
FVC-%Pred-Post: 79 %
FVC-%Pred-Pre: 80 %
FVC-Post: 2.47 L
FVC-Pre: 2.48 L
Post FEV1/FVC ratio: 83 %
Post FEV6/FVC ratio: 99 %
Pre FEV1/FVC ratio: 84 %
Pre FEV6/FVC Ratio: 100 %
RV % pred: 109 %
RV: 2.05 L
TLC % pred: 97 %
TLC: 4.63 L

## 2021-12-04 MED ORDER — ALBUTEROL SULFATE (2.5 MG/3ML) 0.083% IN NEBU
2.5000 mg | INHALATION_SOLUTION | Freq: Once | RESPIRATORY_TRACT | Status: AC
  Administered 2021-12-04: 2.5 mg via RESPIRATORY_TRACT

## 2021-12-22 ENCOUNTER — Telehealth: Payer: Self-pay | Admitting: *Deleted

## 2021-12-22 ENCOUNTER — Ambulatory Visit: Payer: 59 | Admitting: Cardiology

## 2021-12-22 NOTE — Telephone Encounter (Signed)
ECHO -   KELLER MIKELS, LPN  4/74/2595 63:87 AM EDT Back to Top    Notified, copy to pcp.  Appointment scheduled for Tuesday, 12/26/21 at noon in [place] office with Dr. Harl Bowie.    Arnoldo Lenis, MD  12/22/2021  8:21 AM EDT     Echo does show some new weakness to heart muscle, can she see me next week, can put her on at noon any day except Monday that's open     Zandra Abts MD    PFT -   DEMPSEY KNOTEK, LPN  5/64/3329 51:88 AM EDT Back to Top    Notified, copy to pcp.  States that her breathing did improve with the antibiotic and Prednisone.    Arnoldo Lenis, MD  12/20/2021  1:26 PM EDT     PFTs just mildly abnormal. Did her breathing improve at all with the course of prednisone and zpack?   Zandra Abts MD

## 2021-12-26 ENCOUNTER — Telehealth: Payer: Self-pay | Admitting: Cardiology

## 2021-12-26 ENCOUNTER — Ambulatory Visit (INDEPENDENT_AMBULATORY_CARE_PROVIDER_SITE_OTHER): Payer: 59 | Admitting: Cardiology

## 2021-12-26 ENCOUNTER — Other Ambulatory Visit: Payer: Self-pay | Admitting: Cardiology

## 2021-12-26 ENCOUNTER — Encounter: Payer: Self-pay | Admitting: Cardiology

## 2021-12-26 VITALS — BP 140/98 | HR 82 | Ht 62.0 in | Wt 180.2 lb

## 2021-12-26 DIAGNOSIS — I5022 Chronic systolic (congestive) heart failure: Secondary | ICD-10-CM | POA: Diagnosis not present

## 2021-12-26 DIAGNOSIS — Z01810 Encounter for preprocedural cardiovascular examination: Secondary | ICD-10-CM | POA: Diagnosis not present

## 2021-12-26 MED ORDER — ENTRESTO 24-26 MG PO TABS: 1.0000 | ORAL_TABLET | Freq: Two times a day (BID) | ORAL | 6 refills | Status: DC

## 2021-12-26 MED ORDER — BISOPROLOL FUMARATE 5 MG PO TABS: 2.5000 mg | ORAL_TABLET | Freq: Every day | ORAL | 1 refills | Status: DC

## 2021-12-26 MED ORDER — SODIUM CHLORIDE 0.9% FLUSH: 3.0000 mL | Freq: Two times a day (BID) | INTRAVENOUS | Status: AC

## 2021-12-26 NOTE — Telephone Encounter (Signed)
PERCERT:  Right and Left heart cath dx: chronic systolic heart failure, Thursday, January 04, 2022 with Dr. Tamala Julian at 9:00 am

## 2021-12-26 NOTE — Progress Notes (Signed)
Clinical Summary Ms. Triplett is a 61 y.o.female seen today for follow up of the following medical problems.    1. NICM - 07/2016 echo LVEF 40-45% - 01/2017 LVEF 50-55% - Cardiac catheterization performed on 08/08/2016 showed 10% RCA, normal LVEDP, EF 40%. No significant coronary artery disease seen. - medical therapy limited by soft bp's     06/2019 echo: LVEF 50-55% 11/2021 echo: LVEF 30-35%, grade I dd - chronic SOB better with inhalers, some cough and wheeze. Occasional LE edema in legs      Other medical issues not addressed this visit   2. History of chest pain Left sided stabbing left sided. Can occur at any time. Can go into left arm. Can last up to 24 hours without relief. Not positional   12/2019 nuclear stress: apical thinning, cannot completely exclude very mild apical ischemia.   Denies any recent chest pains.      3. Hyperliidemia -11/2018 collected 6pm, likely not fasting.    02/2020 TC 165 TG 336 HDL 32 LDL 85 - reports high soda intake, sweets   4. Sinus tachycardia - some recent diarrhea, 1-2 loose BMs per day for a few months. Some dry heaving at times.  - poor oral hydration. Mountain dew bottles x 4-6 bottles. - chronic back pain, typically 4-8/10 in severity.      5. COPD - remote PFTs - only on prn albuterol       SH: mother is Tempie Hoist, also a pateint of mine.  Past Medical History:  Diagnosis Date   Anxiety    Asthma    daily inhaler   Bipolar disorder ([place])    Bronchitis due to tobacco use 07/2016   Carpal tunnel syndrome of right wrist 12/2012   CHF (congestive heart failure) (HCC)    Chronic back pain greater than 3 months duration    Depression    Full dentures    GERD (gastroesophageal reflux disease)    Headache(784.0)    1-2 x/month   History of ankle sprain    right; 2 weeks ago;  c/o severe pain   Hypertension    Seizures ([place])    states "silent seizures"; is on anticonvulsant; none in 2-3 mos.     Allergies   Allergen Reactions   Apple Juice Anaphylaxis and Shortness Of Breath   Penicillins Anaphylaxis, Shortness Of Breath, Itching and Swelling    Has patient had a PCN reaction causing immediate rash, facial/tongue/throat swelling, SOB or lightheadedness with hypotension: Unknown, childhood reaction Has patient had a PCN reaction causing severe rash involving mucus membranes or skin necrosis: No Has patient had a PCN reaction that required hospitalization No Has patient had a PCN reaction occurring within the last 10 years: No If all of the above answers are "NO", then may proceed with Cephalosporin use.    Bee Venom Swelling    Takes benadryl to help with reaction     Current Outpatient Medications  Medication Sig Dispense Refill   ASMANEX, 60 METERED DOSES, 220 MCG/INH inhaler Inhale 1 puff into the lungs 2 (two) times daily. (Patient not taking: Reported on 11/10/2021)     aspirin EC 81 MG tablet Take 81 mg by mouth daily.     azithromycin (ZITHROMAX Z-PAK) 250 MG tablet Take 2 tablets (500 mg total) by mouth daily AND 1 tablet (250 mg total) daily. 6 each 0   beclomethasone (QVAR) 80 MCG/ACT inhaler Inhale 1 puff into the lungs 2 (two) times daily.  (  Patient not taking: Reported on 11/10/2021)     benztropine (COGENTIN) 1 MG tablet Take 1 mg by mouth daily.     clonazePAM (KLONOPIN) 0.5 MG tablet Take 0.5 mg by mouth 2 (two) times daily.     cyclobenzaprine (FLEXERIL) 10 MG tablet Take 10 mg by mouth 2 (two) times daily as needed.     diphenhydrAMINE (BENADRYL) 25 MG tablet Take 1 tablet (25 mg total) by mouth every 6 (six) hours as needed for itching. 30 tablet 0   esomeprazole (NEXIUM) 40 MG capsule Take 40 mg by mouth daily at 12 noon. (Patient not taking: Reported on 11/10/2021)     gabapentin (NEURONTIN) 300 MG capsule Take 300 mg by mouth 3 (three) times daily.     ibuprofen (ADVIL) 600 MG tablet Take 1 tablet (600 mg total) by mouth every 6 (six) hours as needed. 30 tablet 0    INGREZZA 80 MG CAPS Take 1 capsule by mouth at bedtime.     losartan (COZAAR) 25 MG tablet Take 25 mg by mouth daily.     Lurasidone HCl 120 MG TABS Take 1 tablet by mouth at bedtime.     MYRBETRIQ 50 MG TB24 tablet Take 50 mg by mouth daily.     nicotine (NICODERM CQ - DOSED IN MG/24 HOURS) 14 mg/24hr patch Place 1 patch (14 mg total) onto the skin daily. For 2 weeks, then reduce to 7 mg (Patient not taking: Reported on 11/10/2021) 14 patch 0   nicotine (NICODERM CQ - DOSED IN MG/24 HOURS) 21 mg/24hr patch Place 1 patch (21 mg total) onto the skin daily. For 6 weeks, then reduce to 14 mg daily for 2 weeks (Patient not taking: Reported on 11/10/2021) 28 patch 2   nicotine (NICODERM CQ - DOSED IN MG/24 HR) 7 mg/24hr patch Place 1 patch (7 mg total) onto the skin daily. For 2 weeks, then STOP (Patient not taking: Reported on 11/10/2021) 14 patch 0   nitroGLYCERIN (NITROSTAT) 0.4 MG SL tablet PLACE 1 TAB UNDER TONGUE EVERY 3 MINUTES AS DIRECTED UP TO 3 TIMES AS NEEDED FOR CHEST PAIN. 25 tablet 3   omega-3 acid ethyl esters (LOVAZA) 1 g capsule Take 1 capsule (1 g total) by mouth 2 (two) times daily. (Patient not taking: Reported on 11/10/2021) 60 capsule 0   oxyCODONE-acetaminophen (PERCOCET) 10-325 MG tablet Take 1 tablet by mouth 4 (four) times daily as needed.     prazosin (MINIPRESS) 1 MG capsule Take 1 mg by mouth at bedtime.     PROAIR HFA 108 (90 Base) MCG/ACT inhaler INHALE 2 PUFFS INTO THE LUNGS EVERY 4 HOURS AS NEEDED FOR WHEEZING ORSHORTNESS OF BREATH. (Patient taking differently: Inhale 1-2 puffs into the lungs every 6 (six) hours as needed for wheezing or shortness of breath.) 8.5 g 0   traZODone (DESYREL) 100 MG tablet Take 100 mg by mouth at bedtime.     No current facility-administered medications for this visit.     Past Surgical History:  Procedure Laterality Date   ABDOMINAL HYSTERECTOMY     partial   APPENDECTOMY     BUNIONECTOMY Left    CARPAL TUNNEL RELEASE Right 01/13/2013    Procedure: CARPAL TUNNEL RELEASE;  Surgeon: Cammie Sickle., MD;  Location: [facility];  Service: Orthopedics;  Laterality: Right;   CERVICAL FUSION     x 2   DIAGNOSTIC LAPAROSCOPY  11/08/2006   INGUINAL HERNIA REPAIR Right    LAPAROSCOPIC APPENDECTOMY  11/08/2006  LEFT HEART CATH AND CORONARY ANGIOGRAPHY N/A 08/08/2016   Procedure: Left Heart Cath and Coronary Angiography;  Surgeon: Wellington Hampshire, MD;  Location: [facility];  Service: Cardiovascular;  Laterality: N/A;   LUMBAR FUSION  06/28/2014   L2-5   LUMBAR LAMINECTOMY  02/05/2007   L4-5 fusion   TUBAL LIGATION       Allergies  Allergen Reactions   Apple Juice Anaphylaxis and Shortness Of Breath   Penicillins Anaphylaxis, Shortness Of Breath, Itching and Swelling    Has patient had a PCN reaction causing immediate rash, facial/tongue/throat swelling, SOB or lightheadedness with hypotension: Unknown, childhood reaction Has patient had a PCN reaction causing severe rash involving mucus membranes or skin necrosis: No Has patient had a PCN reaction that required hospitalization No Has patient had a PCN reaction occurring within the last 10 years: No If all of the above answers are "NO", then may proceed with Cephalosporin use.    Bee Venom Swelling    Takes benadryl to help with reaction      Family History  Problem Relation Age of Onset   Lung cancer Father    Diabetes Maternal Grandmother    Heart attack Maternal Grandmother    Deep vein thrombosis Neg Hx      Social History Ms. Zeiger reports that she has been smoking cigarettes. She started smoking about 47 years ago. She has a 20.00 pack-year smoking history. She has never used smokeless tobacco. Ms. Lanes reports no history of alcohol use.   Review of Systems CONSTITUTIONAL: No weight loss, fever, chills, weakness or fatigue.  HEENT: Eyes: No visual loss, blurred vision, double vision or yellow sclerae.No hearing loss, sneezing,  congestion, runny nose or sore throat.  SKIN: No rash or itching.  CARDIOVASCULAR: per hpi RESPIRATORY: No shortness of breath, cough or sputum.  GASTROINTESTINAL: No anorexia, nausea, vomiting or diarrhea. No abdominal pain or blood.  GENITOURINARY: No burning on urination, no polyuria NEUROLOGICAL: No headache, dizziness, syncope, paralysis, ataxia, numbness or tingling in the extremities. No change in bowel or bladder control.  MUSCULOSKELETAL: No muscle, back pain, joint pain or stiffness.  LYMPHATICS: No enlarged nodes. No history of splenectomy.  PSYCHIATRIC: No history of depression or anxiety.  ENDOCRINOLOGIC: No reports of sweating, cold or heat intolerance. No polyuria or polydipsia.  Marland Kitchen   Physical Examination Today's Vitals   12/26/21 1144  BP: (!) 140/98  Pulse: 82  SpO2: 94%  Weight: 180 lb 3.2 oz (81.7 kg)  Height: '5\' 2"'$  (1.575 m)   Body mass index is 32.96 kg/m.  Gen: resting comfortably, no acute distress HEENT: no scleral icterus, pupils equal round and reactive, no palptable cervical adenopathy,  CV: RRR, no m/r/g no jvd Resp: Clear to auscultation bilaterally GI: abdomen is soft, non-tender, non-distended, normal bowel sounds, no hepatosplenomegaly MSK: extremities are warm, no edema.  Skin: warm, no rash Neuro:  no focal deficits Psych: appropriate affect   Diagnostic Studies  Echo 08/06/2016 LV EF: 40% -   45%   Study Conclusions   - Left ventricle: The cavity size was normal. Wall thickness was   normal. Systolic function was mildly to moderately reduced. The   estimated ejection fraction was in the range of 40% to 45%.   Diffuse hypokinesis. - Mitral valve: There was mild regurgitation. - Pulmonary arteries: Systolic pressure was mildly increased. PA   peak pressure: 31 mm Hg (S).         Cath 08/08/2016 Conclusion  Mid RCA lesion, 10 %stenosed. LV end diastolic pressure is normal. There is mild to moderate left ventricular systolic  dysfunction. There is no mitral valve regurgitation.   1. Near normal coronary arteries with no evidence of obstructive disease. 2. Mild to moderately reduced LV systolic function with an ejection fraction of 40% with global hypokinesis. 3. Normal left ventricular end-diastolic pressure.   Recommendations: Continue medical therapy for nonischemic cardiomyopathy. The patient does not require further intravenous diuresis and I switched her to small dose oral furosemide. Right heart catheterization was planned. However, it could not be done via the right antecubital area due to cellulitis and the wire could not be advanced via the left antecubital vein. Given that her LVEDP was normal, I elected not to pursue a right heart catheterization. There was no evidence of pulmonary hypertension on echo.      06/2019 echo IMPRESSIONS     1. Left ventricular ejection fraction, by estimation, is 50 to 55%. The  left ventricle has low normal function. The left ventrical has no regional  wall motion abnormalities. Left ventricular diastolic parameters were  normal.   2. Right ventricular systolic function is normal. The right ventricular  size is normal. There is normal pulmonary artery systolic pressure. The  estimated right ventricular systolic pressure is 74.0 mmHg.   3. Left atrial size was upper normal.   4. The mitral valve is grossly normal. Mild mitral valve regurgitation.   5. The aortic valve is tricuspid. Aortic valve regurgitation is not  visualized.   6. The inferior vena cava is normal in size with greater than 50%  respiratory variability, suggesting right atrial pressure of 3 mmHg.      12/2019 nuclear stress There was no ST segment deviation noted during stress. This is a low risk study. The left ventricular ejection fraction is low normal (50%). Small mild apical defect likely due to slight differences in apical thinning. Cannot completely exclude small apical infarct with very  mild peri-infarct ischemia. Either finding would support low risk.    11/2021 echo 1. Left ventricular ejection fraction, by estimation, is 30 to 35%. The  left ventricle has moderately decreased function. The left ventricle  demonstrates global hypokinesis. The left ventricular internal cavity size  was mildly dilated. There is mild  left ventricular hypertrophy. Left ventricular diastolic parameters are  consistent with Grade I diastolic dysfunction (impaired relaxation). The  average left ventricular global longitudinal strain is -10.0 %. The global  longitudinal strain is abnormal.   2. Right ventricular systolic function is normal. The right ventricular  size is normal. The estimated right ventricular systolic pressure is 7.2  mmHg.   3. The mitral valve is grossly normal. Trivial mitral valve  regurgitation. No evidence of mitral stenosis.   4. The aortic valve is tricuspid. Aortic valve regurgitation is not  visualized. No aortic stenosis is present.   5. The inferior vena cava is normal in size with greater than 50%  respiratory variability, suggesting right atrial pressure of 3 mmHg.   Assessment and Plan   1. Chronic systolic HF - prior mild LV dysfunction that normalized however by recent echo recurrent dysfunction, LVEF down to 30-35% - will start bisoprolol 2.'5mg'$  daily given her COPD. Change losartan to entresto 24/'26mg'$  bid. Aldactone and SGLT2i will be added at f/u.  - with significant drop in LVEF plan for RHC/LHC to evaluate for ICM             Arnoldo Lenis, M.D.

## 2021-12-26 NOTE — H&P (View-Only) (Signed)
Clinical Summary Darlene Wilcox is a 61 y.o.female seen today for follow up of the following medical problems.    1. NICM - 07/2016 echo LVEF 40-45% - 01/2017 LVEF 50-55% - Cardiac catheterization performed on 08/08/2016 showed 10% RCA, normal LVEDP, EF 40%. No significant coronary artery disease seen. - medical therapy limited by soft bp's     06/2019 echo: LVEF 50-55% 11/2021 echo: LVEF 30-35%, grade I dd - chronic SOB better with inhalers, some cough and wheeze. Occasional LE edema in legs      Other medical issues not addressed this visit   2. History of chest pain Left sided stabbing left sided. Can occur at any time. Can go into left arm. Can last up to 24 hours without relief. Not positional   12/2019 nuclear stress: apical thinning, cannot completely exclude very mild apical ischemia.   Denies any recent chest pains.      3. Hyperliidemia -11/2018 collected 6pm, likely not fasting.    02/2020 TC 165 TG 336 HDL 32 LDL 85 - reports high soda intake, sweets   4. Sinus tachycardia - some recent diarrhea, 1-2 loose BMs per day for a few months. Some dry heaving at times.  - poor oral hydration. Mountain dew bottles x 4-6 bottles. - chronic back pain, typically 4-8/10 in severity.      5. COPD - remote PFTs - only on prn albuterol       SH: mother is Darlene Wilcox, also a pateint of mine.  Past Medical History:  Diagnosis Date   Anxiety    Asthma    daily inhaler   Bipolar disorder ([place])    Bronchitis due to tobacco use 07/2016   Carpal tunnel syndrome of right wrist 12/2012   CHF (congestive heart failure) (HCC)    Chronic back pain greater than 3 months duration    Depression    Full dentures    GERD (gastroesophageal reflux disease)    Headache(784.0)    1-2 x/month   History of ankle sprain    right; 2 weeks ago;  c/o severe pain   Hypertension    Seizures ([place])    states "silent seizures"; is on anticonvulsant; none in 2-3 mos.     Allergies   Allergen Reactions   Apple Juice Anaphylaxis and Shortness Of Breath   Penicillins Anaphylaxis, Shortness Of Breath, Itching and Swelling    Has patient had a PCN reaction causing immediate rash, facial/tongue/throat swelling, SOB or lightheadedness with hypotension: Unknown, childhood reaction Has patient had a PCN reaction causing severe rash involving mucus membranes or skin necrosis: No Has patient had a PCN reaction that required hospitalization No Has patient had a PCN reaction occurring within the last 10 years: No If all of the above answers are "NO", then may proceed with Cephalosporin use.    Bee Venom Swelling    Takes benadryl to help with reaction     Current Outpatient Medications  Medication Sig Dispense Refill   ASMANEX, 60 METERED DOSES, 220 MCG/INH inhaler Inhale 1 puff into the lungs 2 (two) times daily. (Patient not taking: Reported on 11/10/2021)     aspirin EC 81 MG tablet Take 81 mg by mouth daily.     azithromycin (ZITHROMAX Z-PAK) 250 MG tablet Take 2 tablets (500 mg total) by mouth daily AND 1 tablet (250 mg total) daily. 6 each 0   beclomethasone (QVAR) 80 MCG/ACT inhaler Inhale 1 puff into the lungs 2 (two) times daily.  (  Patient not taking: Reported on 11/10/2021)     benztropine (COGENTIN) 1 MG tablet Take 1 mg by mouth daily.     clonazePAM (KLONOPIN) 0.5 MG tablet Take 0.5 mg by mouth 2 (two) times daily.     cyclobenzaprine (FLEXERIL) 10 MG tablet Take 10 mg by mouth 2 (two) times daily as needed.     diphenhydrAMINE (BENADRYL) 25 MG tablet Take 1 tablet (25 mg total) by mouth every 6 (six) hours as needed for itching. 30 tablet 0   esomeprazole (NEXIUM) 40 MG capsule Take 40 mg by mouth daily at 12 noon. (Patient not taking: Reported on 11/10/2021)     gabapentin (NEURONTIN) 300 MG capsule Take 300 mg by mouth 3 (three) times daily.     ibuprofen (ADVIL) 600 MG tablet Take 1 tablet (600 mg total) by mouth every 6 (six) hours as needed. 30 tablet 0    INGREZZA 80 MG CAPS Take 1 capsule by mouth at bedtime.     losartan (COZAAR) 25 MG tablet Take 25 mg by mouth daily.     Lurasidone HCl 120 MG TABS Take 1 tablet by mouth at bedtime.     MYRBETRIQ 50 MG TB24 tablet Take 50 mg by mouth daily.     nicotine (NICODERM CQ - DOSED IN MG/24 HOURS) 14 mg/24hr patch Place 1 patch (14 mg total) onto the skin daily. For 2 weeks, then reduce to 7 mg (Patient not taking: Reported on 11/10/2021) 14 patch 0   nicotine (NICODERM CQ - DOSED IN MG/24 HOURS) 21 mg/24hr patch Place 1 patch (21 mg total) onto the skin daily. For 6 weeks, then reduce to 14 mg daily for 2 weeks (Patient not taking: Reported on 11/10/2021) 28 patch 2   nicotine (NICODERM CQ - DOSED IN MG/24 HR) 7 mg/24hr patch Place 1 patch (7 mg total) onto the skin daily. For 2 weeks, then STOP (Patient not taking: Reported on 11/10/2021) 14 patch 0   nitroGLYCERIN (NITROSTAT) 0.4 MG SL tablet PLACE 1 TAB UNDER TONGUE EVERY 3 MINUTES AS DIRECTED UP TO 3 TIMES AS NEEDED FOR CHEST PAIN. 25 tablet 3   omega-3 acid ethyl esters (LOVAZA) 1 g capsule Take 1 capsule (1 g total) by mouth 2 (two) times daily. (Patient not taking: Reported on 11/10/2021) 60 capsule 0   oxyCODONE-acetaminophen (PERCOCET) 10-325 MG tablet Take 1 tablet by mouth 4 (four) times daily as needed.     prazosin (MINIPRESS) 1 MG capsule Take 1 mg by mouth at bedtime.     PROAIR HFA 108 (90 Base) MCG/ACT inhaler INHALE 2 PUFFS INTO THE LUNGS EVERY 4 HOURS AS NEEDED FOR WHEEZING ORSHORTNESS OF BREATH. (Patient taking differently: Inhale 1-2 puffs into the lungs every 6 (six) hours as needed for wheezing or shortness of breath.) 8.5 g 0   traZODone (DESYREL) 100 MG tablet Take 100 mg by mouth at bedtime.     No current facility-administered medications for this visit.     Past Surgical History:  Procedure Laterality Date   ABDOMINAL HYSTERECTOMY     partial   APPENDECTOMY     BUNIONECTOMY Left    CARPAL TUNNEL RELEASE Right 01/13/2013    Procedure: CARPAL TUNNEL RELEASE;  Surgeon: Cammie Sickle., MD;  Location: [facility];  Service: Orthopedics;  Laterality: Right;   CERVICAL FUSION     x 2   DIAGNOSTIC LAPAROSCOPY  11/08/2006   INGUINAL HERNIA REPAIR Right    LAPAROSCOPIC APPENDECTOMY  11/08/2006  LEFT HEART CATH AND CORONARY ANGIOGRAPHY N/A 08/08/2016   Procedure: Left Heart Cath and Coronary Angiography;  Surgeon: Wellington Hampshire, MD;  Location: [facility];  Service: Cardiovascular;  Laterality: N/A;   LUMBAR FUSION  06/28/2014   L2-5   LUMBAR LAMINECTOMY  02/05/2007   L4-5 fusion   TUBAL LIGATION       Allergies  Allergen Reactions   Apple Juice Anaphylaxis and Shortness Of Breath   Penicillins Anaphylaxis, Shortness Of Breath, Itching and Swelling    Has patient had a PCN reaction causing immediate rash, facial/tongue/throat swelling, SOB or lightheadedness with hypotension: Unknown, childhood reaction Has patient had a PCN reaction causing severe rash involving mucus membranes or skin necrosis: No Has patient had a PCN reaction that required hospitalization No Has patient had a PCN reaction occurring within the last 10 years: No If all of the above answers are "NO", then may proceed with Cephalosporin use.    Bee Venom Swelling    Takes benadryl to help with reaction      Family History  Problem Relation Age of Onset   Lung cancer Father    Diabetes Maternal Grandmother    Heart attack Maternal Grandmother    Deep vein thrombosis Neg Hx      Social History Ms. Orth reports that she has been smoking cigarettes. She started smoking about 47 years ago. She has a 20.00 pack-year smoking history. She has never used smokeless tobacco. Ms. Terhaar reports no history of alcohol use.   Review of Systems CONSTITUTIONAL: No weight loss, fever, chills, weakness or fatigue.  HEENT: Eyes: No visual loss, blurred vision, double vision or yellow sclerae.No hearing loss, sneezing,  congestion, runny nose or sore throat.  SKIN: No rash or itching.  CARDIOVASCULAR: per hpi RESPIRATORY: No shortness of breath, cough or sputum.  GASTROINTESTINAL: No anorexia, nausea, vomiting or diarrhea. No abdominal pain or blood.  GENITOURINARY: No burning on urination, no polyuria NEUROLOGICAL: No headache, dizziness, syncope, paralysis, ataxia, numbness or tingling in the extremities. No change in bowel or bladder control.  MUSCULOSKELETAL: No muscle, back pain, joint pain or stiffness.  LYMPHATICS: No enlarged nodes. No history of splenectomy.  PSYCHIATRIC: No history of depression or anxiety.  ENDOCRINOLOGIC: No reports of sweating, cold or heat intolerance. No polyuria or polydipsia.  Marland Kitchen   Physical Examination Today's Vitals   12/26/21 1144  BP: (!) 140/98  Pulse: 82  SpO2: 94%  Weight: 180 lb 3.2 oz (81.7 kg)  Height: '5\' 2"'$  (1.575 m)   Body mass index is 32.96 kg/m.  Gen: resting comfortably, no acute distress HEENT: no scleral icterus, pupils equal round and reactive, no palptable cervical adenopathy,  CV: RRR, no m/r/g no jvd Resp: Clear to auscultation bilaterally GI: abdomen is soft, non-tender, non-distended, normal bowel sounds, no hepatosplenomegaly MSK: extremities are warm, no edema.  Skin: warm, no rash Neuro:  no focal deficits Psych: appropriate affect   Diagnostic Studies  Echo 08/06/2016 LV EF: 40% -   45%   Study Conclusions   - Left ventricle: The cavity size was normal. Wall thickness was   normal. Systolic function was mildly to moderately reduced. The   estimated ejection fraction was in the range of 40% to 45%.   Diffuse hypokinesis. - Mitral valve: There was mild regurgitation. - Pulmonary arteries: Systolic pressure was mildly increased. PA   peak pressure: 31 mm Hg (S).         Cath 08/08/2016 Conclusion  Mid RCA lesion, 10 %stenosed. LV end diastolic pressure is normal. There is mild to moderate left ventricular systolic  dysfunction. There is no mitral valve regurgitation.   1. Near normal coronary arteries with no evidence of obstructive disease. 2. Mild to moderately reduced LV systolic function with an ejection fraction of 40% with global hypokinesis. 3. Normal left ventricular end-diastolic pressure.   Recommendations: Continue medical therapy for nonischemic cardiomyopathy. The patient does not require further intravenous diuresis and I switched her to small dose oral furosemide. Right heart catheterization was planned. However, it could not be done via the right antecubital area due to cellulitis and the wire could not be advanced via the left antecubital vein. Given that her LVEDP was normal, I elected not to pursue a right heart catheterization. There was no evidence of pulmonary hypertension on echo.      06/2019 echo IMPRESSIONS     1. Left ventricular ejection fraction, by estimation, is 50 to 55%. The  left ventricle has low normal function. The left ventrical has no regional  wall motion abnormalities. Left ventricular diastolic parameters were  normal.   2. Right ventricular systolic function is normal. The right ventricular  size is normal. There is normal pulmonary artery systolic pressure. The  estimated right ventricular systolic pressure is 21.1 mmHg.   3. Left atrial size was upper normal.   4. The mitral valve is grossly normal. Mild mitral valve regurgitation.   5. The aortic valve is tricuspid. Aortic valve regurgitation is not  visualized.   6. The inferior vena cava is normal in size with greater than 50%  respiratory variability, suggesting right atrial pressure of 3 mmHg.      12/2019 nuclear stress There was no ST segment deviation noted during stress. This is a low risk study. The left ventricular ejection fraction is low normal (50%). Small mild apical defect likely due to slight differences in apical thinning. Cannot completely exclude small apical infarct with very  mild peri-infarct ischemia. Either finding would support low risk.    11/2021 echo 1. Left ventricular ejection fraction, by estimation, is 30 to 35%. The  left ventricle has moderately decreased function. The left ventricle  demonstrates global hypokinesis. The left ventricular internal cavity size  was mildly dilated. There is mild  left ventricular hypertrophy. Left ventricular diastolic parameters are  consistent with Grade I diastolic dysfunction (impaired relaxation). The  average left ventricular global longitudinal strain is -10.0 %. The global  longitudinal strain is abnormal.   2. Right ventricular systolic function is normal. The right ventricular  size is normal. The estimated right ventricular systolic pressure is 7.2  mmHg.   3. The mitral valve is grossly normal. Trivial mitral valve  regurgitation. No evidence of mitral stenosis.   4. The aortic valve is tricuspid. Aortic valve regurgitation is not  visualized. No aortic stenosis is present.   5. The inferior vena cava is normal in size with greater than 50%  respiratory variability, suggesting right atrial pressure of 3 mmHg.   Assessment and Plan   1. Chronic systolic HF - prior mild LV dysfunction that normalized however by recent echo recurrent dysfunction, LVEF down to 30-35% - will start bisoprolol 2.'5mg'$  daily given her COPD. Change losartan to entresto 24/'26mg'$  bid. Aldactone and SGLT2i will be added at f/u.  - with significant drop in LVEF plan for RHC/LHC to evaluate for ICM             Arnoldo Lenis, M.D.

## 2021-12-26 NOTE — Patient Instructions (Addendum)
Medication Instructions:  Your physician has recommended you make the following change in your medication:  Stop losartan Start entresto 24/26 mg twice daily Start bisoprolol 2.5 mg daily Continue other medications the same  Labwork: BMET & CBC today or tomorrow [facility] or [facility] Non-fasting  Testing/Procedures: Your physician has requested that you have a cardiac catheterization. Cardiac catheterization is used to diagnose and/or treat various heart conditions. Doctors may recommend this procedure for a number of different reasons. The most common reason is to evaluate chest pain. Chest pain can be a symptom of coronary artery disease (CAD), and cardiac catheterization can show whether plaque is narrowing or blocking your heart's arteries. This procedure is also used to evaluate the valves, as well as measure the blood flow and oxygen levels in different parts of your heart. For further information please visit [email or web address]. Please follow instruction sheet, as given.  Follow-Up: Your physician recommends that you schedule a follow-up appointment in: 3 weeks  Any Other Special Instructions Will Be Listed Below (If Applicable).  If you need a refill on your cardiac medications before your next appointment, please call your pharmacy.   [address] [place] [address] Dept: [phone] Loc: [facility]  12/26/2021  You are scheduled for a Cardiac Catheterization on Thursday, August 10 with Dr. Daneen Schick.  1. Please arrive at the Main Entrance A at [facility]: [place], [place] [address] at 7:00 AM (This time is two hours before your procedure to ensure your preparation). Free valet parking service is available.   Special note: Every effort is made to have your procedure done on time. Please understand  that emergencies sometimes delay scheduled procedures.  2. Diet: Do not eat solid foods after midnight.  You may have clear liquids until 5 AM upon the day of the procedure.  3. Labs: You will need to have blood drawn on Tuesday, August 1 at [facility] or [facility]. You do not need to be fasting.  4. Medication instructions in preparation for your procedure: You may take your medications on the morning of your cath   Contrast Allergy: No  On the morning of your procedure, take Aspirin 81 mg and any morning medicines NOT listed above.  You may use sips of water.  5. Plan to go home the same day, you will only stay overnight if medically necessary. 6. You MUST have a responsible adult to drive you home. 7. An adult MUST be with you the first 24 hours after you arrive home. 8. Bring a current list of your medications, and the last time and date medication taken. 9. Bring ID and current insurance cards. 10.Please wear clothes that are easy to get on and off and wear slip-on shoes.  Thank you for allowing [place] to care for you!   -- [facility]

## 2021-12-27 ENCOUNTER — Other Ambulatory Visit (HOSPITAL_COMMUNITY)
Admission: RE | Admit: 2021-12-27 | Discharge: 2021-12-27 | Disposition: A | Discharge status: Home / Self Care | Payer: 59 | Source: Ambulatory Visit | Attending: Cardiology | Admitting: Cardiology

## 2021-12-27 DIAGNOSIS — I5022 Chronic systolic (congestive) heart failure: Secondary | ICD-10-CM | POA: Diagnosis not present

## 2021-12-27 DIAGNOSIS — Z01810 Encounter for preprocedural cardiovascular examination: Secondary | ICD-10-CM | POA: Diagnosis present

## 2021-12-27 LAB — BASIC METABOLIC PANEL
Anion gap: 5 (ref 5–15)
BUN: 13 mg/dL (ref 6–20)
CO2: 24 mmol/L (ref 22–32)
Calcium: 8.9 mg/dL (ref 8.9–10.3)
Chloride: 109 mmol/L (ref 98–111)
Creatinine, Ser: 0.75 mg/dL (ref 0.44–1.00)
GFR, Estimated: >60 mL/min (ref >60)
Glucose, Bld: 107 mg/dL — ABNORMAL HIGH (ref 70–99)
Potassium: 4.2 mmol/L (ref 3.5–5.1)
Sodium: 138 mmol/L (ref 135–145)

## 2021-12-27 LAB — CBC
HCT: 42.3 % (ref 36.0–46.0)
Hemoglobin: 14.3 g/dL (ref 12.0–15.0)
MCH: 29.9 pg (ref 26.0–34.0)
MCHC: 33.8 g/dL (ref 30.0–36.0)
MCV: 88.3 fL (ref 80.0–100.0)
Platelets: 185 10*3/uL (ref 150–400)
RBC: 4.79 MIL/uL (ref 3.87–5.11)
RDW: 12.7 % (ref 11.5–15.5)
WBC: 11.4 10*3/uL — ABNORMAL HIGH (ref 4.0–10.5)
nRBC: 0 % (ref 0.0–0.2)

## 2022-01-03 NOTE — H&P (Signed)
Dyspnea on exertion Worsening systolic dysfunction Being evaluated for ICD.  Want to exclude progression of very mild coronary disease from prior study.  This will be a left on the right.

## 2022-01-04 ENCOUNTER — Encounter (HOSPITAL_COMMUNITY)
Admission: RE | Disposition: A | Discharge status: Home / Self Care | Payer: Self-pay | Source: Ambulatory Visit | Attending: Interventional Cardiology

## 2022-01-04 ENCOUNTER — Ambulatory Visit (HOSPITAL_COMMUNITY)
Admission: RE | Admit: 2022-01-04 | Discharge: 2022-01-04 | Disposition: A | Discharge status: Home / Self Care | Payer: 59 | Source: Ambulatory Visit | Attending: Interventional Cardiology | Admitting: Interventional Cardiology

## 2022-01-04 ENCOUNTER — Other Ambulatory Visit: Payer: Self-pay

## 2022-01-04 ENCOUNTER — Encounter (HOSPITAL_COMMUNITY): Payer: Self-pay | Admitting: Interventional Cardiology

## 2022-01-04 DIAGNOSIS — I110 Hypertensive heart disease with heart failure: Secondary | ICD-10-CM | POA: Insufficient documentation

## 2022-01-04 DIAGNOSIS — I428 Other cardiomyopathies: Secondary | ICD-10-CM | POA: Insufficient documentation

## 2022-01-04 DIAGNOSIS — J449 Chronic obstructive pulmonary disease, unspecified: Secondary | ICD-10-CM | POA: Insufficient documentation

## 2022-01-04 DIAGNOSIS — I2510 Atherosclerotic heart disease of native coronary artery without angina pectoris: Secondary | ICD-10-CM | POA: Diagnosis not present

## 2022-01-04 DIAGNOSIS — I5021 Acute systolic (congestive) heart failure: Secondary | ICD-10-CM | POA: Diagnosis present

## 2022-01-04 DIAGNOSIS — Z79899 Other long term (current) drug therapy: Secondary | ICD-10-CM | POA: Insufficient documentation

## 2022-01-04 DIAGNOSIS — I5022 Chronic systolic (congestive) heart failure: Secondary | ICD-10-CM | POA: Insufficient documentation

## 2022-01-04 DIAGNOSIS — F17210 Nicotine dependence, cigarettes, uncomplicated: Secondary | ICD-10-CM | POA: Diagnosis present

## 2022-01-04 DIAGNOSIS — Z87891 Personal history of nicotine dependence: Secondary | ICD-10-CM | POA: Diagnosis not present

## 2022-01-04 HISTORY — PX: RIGHT/LEFT HEART CATH AND CORONARY ANGIOGRAPHY: CATH118266

## 2022-01-04 LAB — POCT I-STAT 7, (LYTES, BLD GAS, ICA,H+H)
Acid-Base Excess: 1 mmol/L (ref 0.0–2.0)
Acid-base deficit: 4 mmol/L — ABNORMAL HIGH (ref 0.0–2.0)
Bicarbonate: 27.5 mmol/L (ref 20.0–28.0)
Bicarbonate: 23 mmol/L (ref 20.0–28.0)
Calcium, Ion: 1.24 mmol/L (ref 1.15–1.40)
Calcium, Ion: 1.21 mmol/L (ref 1.15–1.40)
HCT: 41 % (ref 36.0–46.0)
HCT: 40 % (ref 36.0–46.0)
Hemoglobin: 13.9 g/dL (ref 12.0–15.0)
Hemoglobin: 13.6 g/dL (ref 12.0–15.0)
O2 Saturation: 63 %
O2 Saturation: 89 %
Potassium: 3.5 mmol/L (ref 3.5–5.1)
Potassium: 3.3 mmol/L — ABNORMAL LOW (ref 3.5–5.1)
Sodium: 140 mmol/L (ref 135–145)
Sodium: 131 mmol/L — ABNORMAL LOW (ref 135–145)
TCO2: 29 mmol/L (ref 22–32)
TCO2: 24 mmol/L (ref 22–32)
pCO2 arterial: 48 mmHg (ref 32–48)
pCO2 arterial: 47.8 mmHg (ref 32–48)
pH, Arterial: 7.366 (ref 7.35–7.45)
pH, Arterial: 7.29 — ABNORMAL LOW (ref 7.35–7.45)
pO2, Arterial: 34 mmHg — CL (ref 83–108)
pO2, Arterial: 62 mmHg — ABNORMAL LOW (ref 83–108)

## 2022-01-04 LAB — POCT I-STAT EG7
Acid-Base Excess: 1 mmol/L (ref 0.0–2.0)
Bicarbonate: 26.5 mmol/L (ref 20.0–28.0)
Calcium, Ion: 1.25 mmol/L (ref 1.15–1.40)
HCT: 41 % (ref 36.0–46.0)
Hemoglobin: 13.9 g/dL (ref 12.0–15.0)
O2 Saturation: 63 %
Potassium: 3.5 mmol/L (ref 3.5–5.1)
Sodium: 140 mmol/L (ref 135–145)
TCO2: 28 mmol/L (ref 22–32)
pCO2, Ven: 46.7 mmHg (ref 44–60)
pH, Ven: 7.362 (ref 7.25–7.43)
pO2, Ven: 34 mmHg (ref 32–45)

## 2022-01-04 SURGERY — RIGHT/LEFT HEART CATH AND CORONARY ANGIOGRAPHY
Anesthesia: LOCAL

## 2022-01-04 MED ORDER — VERAPAMIL HCL 2.5 MG/ML IV SOLN
INTRAVENOUS | Status: DC | PRN
  Administered 2022-01-04: 10 mL via INTRA_ARTERIAL

## 2022-01-04 MED ORDER — LIDOCAINE HCL (PF) 1 % IJ SOLN
INTRAMUSCULAR | Status: DC | PRN
  Administered 2022-01-04: 5 mL

## 2022-01-04 MED ORDER — HEPARIN (PORCINE) IN NACL 1000-0.9 UT/500ML-% IV SOLN
INTRAVENOUS | Status: AC
  Filled 2022-01-04: qty 1000

## 2022-01-04 MED ORDER — SODIUM CHLORIDE 0.9% FLUSH: 3.0000 mL | INTRAVENOUS | Status: DC | PRN

## 2022-01-04 MED ORDER — VERAPAMIL HCL 2.5 MG/ML IV SOLN
INTRAVENOUS | Status: AC
  Filled 2022-01-04: qty 2

## 2022-01-04 MED ORDER — SODIUM CHLORIDE 0.9 % IV SOLN: INTRAVENOUS | Status: DC

## 2022-01-04 MED ORDER — ISOSORBIDE MONONITRATE ER 30 MG PO TB24: 30.0000 mg | ORAL_TABLET | Freq: Every day | ORAL | 11 refills | Status: DC

## 2022-01-04 MED ORDER — HEPARIN (PORCINE) IN NACL 1000-0.9 UT/500ML-% IV SOLN
INTRAVENOUS | Status: DC | PRN
  Administered 2022-01-04 (×2): 500 mL

## 2022-01-04 MED ORDER — ASPIRIN 81 MG PO CHEW: 81.0000 mg | CHEWABLE_TABLET | Freq: Every day | ORAL | Status: DC

## 2022-01-04 MED ORDER — HEPARIN SODIUM (PORCINE) 1000 UNIT/ML IJ SOLN
INTRAMUSCULAR | Status: DC | PRN
  Administered 2022-01-04: 4500 [IU] via INTRAVENOUS

## 2022-01-04 MED ORDER — IOHEXOL 350 MG/ML SOLN
INTRAVENOUS | Status: DC | PRN
  Administered 2022-01-04: 65 mL

## 2022-01-04 MED ORDER — LABETALOL HCL 5 MG/ML IV SOLN: 10.0000 mg | INTRAVENOUS | Status: DC | PRN

## 2022-01-04 MED ORDER — SODIUM CHLORIDE 0.9 % IV SOLN: 250.0000 mL | INTRAVENOUS | Status: DC | PRN

## 2022-01-04 MED ORDER — OXYCODONE HCL 5 MG PO TABS: 5.0000 mg | ORAL_TABLET | ORAL | Status: DC | PRN

## 2022-01-04 MED ORDER — HEPARIN SODIUM (PORCINE) 1000 UNIT/ML IJ SOLN
INTRAMUSCULAR | Status: AC
  Filled 2022-01-04: qty 10

## 2022-01-04 MED ORDER — ASPIRIN 81 MG PO CHEW: 81.0000 mg | CHEWABLE_TABLET | ORAL | Status: DC

## 2022-01-04 MED ORDER — LIDOCAINE HCL (PF) 1 % IJ SOLN
INTRAMUSCULAR | Status: AC
  Filled 2022-01-04: qty 30

## 2022-01-04 MED ORDER — HYDRALAZINE HCL 20 MG/ML IJ SOLN: 10.0000 mg | INTRAMUSCULAR | Status: DC | PRN

## 2022-01-04 MED ORDER — ACETAMINOPHEN 325 MG PO TABS: 650.0000 mg | ORAL_TABLET | ORAL | Status: DC | PRN

## 2022-01-04 MED ORDER — SODIUM CHLORIDE 0.9% FLUSH: 3.0000 mL | Freq: Two times a day (BID) | INTRAVENOUS | Status: DC

## 2022-01-04 SURGICAL SUPPLY — 12 items
BAND CMPR LRG ZPHR (HEMOSTASIS) ×1
BAND ZEPHYR COMPRESS 30 LONG (HEMOSTASIS) ×1 IMPLANT
CATH 5FR JL3.5 JR4 ANG PIG MP (CATHETERS) ×1 IMPLANT
CATH BALLN WEDGE 5F 110CM (CATHETERS) ×1 IMPLANT
GLIDESHEATH SLEND A-KIT 6F 22G (SHEATH) ×1 IMPLANT
GUIDEWIRE INQWIRE 1.5J.035X260 (WIRE) IMPLANT
INQWIRE 1.5J .035X260CM (WIRE) ×2
KIT HEART LEFT (KITS) ×2 IMPLANT
PACK CARDIAC CATHETERIZATION (CUSTOM PROCEDURE TRAY) ×2 IMPLANT
SHEATH GLIDE SLENDER 4/5FR (SHEATH) ×1 IMPLANT
TRANSDUCER W/STOPCOCK (MISCELLANEOUS) ×2 IMPLANT
TUBING CIL FLEX 10 FLL-RA (TUBING) ×2 IMPLANT

## 2022-01-04 NOTE — Discharge Instructions (Signed)

## 2022-01-04 NOTE — Progress Notes (Signed)
TR BAND REMOVAL  LOCATION:    right radial  DEFLATED PER PROTOCOL:    Yes.    TIME BAND OFF / DRESSING APPLIED:    1205   SITE UPON ARRIVAL:    Level 0  SITE AFTER BAND REMOVAL:    Level 0  CIRCULATION SENSATION AND MOVEMENT:    Within Normal Limits   Yes.    COMMENTS:   No bleeding noted

## 2022-01-04 NOTE — CV Procedure (Signed)
[place] 0,1,0 involving the mid to distal LAD and a equally large diagonal.  Lesion is after the bifurcation and the LAD and is 75 to 85%. Otherwise coronaries are normal Severe LV dysfunction with EF 25 to 30%.  LVEDP 21 mmHg.  Wedge for millimeters mercury.  Normal right heart pressures.  Recommend medical therapy for LAD lesion.  It would be higher than usual risk and after talking with the patient I do believe she is having angina but we should first try to control that with medications.  This lesion is not the cause of the patient's cardiomyopathy.  PCI should be dependent upon whether symptoms are affecting her quality of life.

## 2022-01-04 NOTE — Interval H&P Note (Signed)
Cath Lab Visit (complete for each Cath Lab visit)  Clinical Evaluation Leading to the Procedure:   ACS: No.  Non-ACS:    Anginal Classification: CCS Wilcox  Anti-ischemic medical therapy: Maximal Therapy (2 or more classes of medications)  Non-Invasive Test Results: High-risk stress test findings: cardiac mortality >3%/year  Prior CABG: No previous CABG      History and Physical Interval Note:  01/04/2022 9:23 AM  Darlene Wilcox  has presented today for surgery, with the diagnosis of Chronic Systolic Heart Failure.  The various methods of treatment have been discussed with the patient and family. After consideration of risks, benefits and other options for treatment, the patient has consented to  Procedure(s): RIGHT/LEFT HEART CATH AND CORONARY ANGIOGRAPHY (N/A) as a surgical intervention.  The patient's history has been reviewed, patient examined, no change in status, stable for surgery.  I have reviewed the patient's chart and labs.  Questions were answered to the patient's satisfaction.     Darlene Wilcox

## 2022-01-04 NOTE — Progress Notes (Signed)
Patient and sister was given discharge instructions. Both verbalized understanding. 

## 2022-01-12 ENCOUNTER — Telehealth: Payer: Self-pay | Admitting: Cardiology

## 2022-01-12 NOTE — Telephone Encounter (Signed)
Advised that gabapentin is not listed on her medication list Advised to contact PCP for refills on inhalers. Verbalized understanding of plan

## 2022-01-12 NOTE — Telephone Encounter (Signed)
Pt c/o medication issue:  1. Name of Medication: Gabapentin  2. How are you currently taking this medication (dosage and times per day)?   3. Are you having a reaction (difficulty breathing--STAT)?   4. What is your medication issue? She wants Dr Harl Bowie to know she does not take this medicine any more.   She says she needs her other inhalers called in please. She said she did not know the name of the inhalers.

## 2022-01-15 ENCOUNTER — Encounter: Payer: Self-pay | Admitting: *Deleted

## 2022-01-15 ENCOUNTER — Telehealth: Payer: Self-pay | Admitting: *Deleted

## 2022-01-15 NOTE — Telephone Encounter (Signed)
JAMILIA JACQUES, LPN  8/33/8250  5:39 PM EDT Back to Top    Patient notified via letter.  Copy to pcp.

## 2022-01-15 NOTE — Telephone Encounter (Signed)
-----   Message from Arnoldo Lenis, MD sent at 01/01/2022  3:10 PM EDT ----- Labs look fine  Zandra Abts MD

## 2022-01-24 ENCOUNTER — Encounter: Payer: Self-pay | Admitting: Cardiology

## 2022-01-24 ENCOUNTER — Ambulatory Visit: Payer: 59 | Attending: Cardiology | Admitting: Cardiology

## 2022-01-24 VITALS — BP 130/88 | HR 96 | Ht 62.0 in | Wt 182.0 lb

## 2022-01-24 DIAGNOSIS — I2510 Atherosclerotic heart disease of native coronary artery without angina pectoris: Secondary | ICD-10-CM | POA: Diagnosis not present

## 2022-01-24 DIAGNOSIS — I5022 Chronic systolic (congestive) heart failure: Secondary | ICD-10-CM

## 2022-01-24 MED ORDER — ATORVASTATIN CALCIUM 20 MG PO TABS: 20.0000 mg | ORAL_TABLET | Freq: Every day | ORAL | 1 refills | Status: DC

## 2022-01-24 MED ORDER — DAPAGLIFLOZIN PROPANEDIOL 10 MG PO TABS: 10.0000 mg | ORAL_TABLET | Freq: Every day | ORAL | 1 refills | Status: DC

## 2022-01-24 MED ORDER — SPIRONOLACTONE 25 MG PO TABS: 12.5000 mg | ORAL_TABLET | Freq: Every day | ORAL | 1 refills | Status: DC

## 2022-01-24 NOTE — Patient Instructions (Addendum)
Medication Instructions:  Your physician has recommended you make the following change in your medication:  Start aldactone 12.5 mg once a day Start farxiga 10 mg once a day Start Lipitor 20 mg once a day Continue all other medications as directed  Labwork: BMET in 2 weeks at [facility]  Testing/Procedures: none  Follow-Up:  Your physician recommends that you schedule a follow-up appointment in: 3 months  Any Other Special Instructions Will Be Listed Below (If Applicable).  Nurse visit in 3 weeks for vital signs check  If you need a refill on your cardiac medications before your next appointment, please call your pharmacy.

## 2022-01-24 NOTE — Progress Notes (Signed)
Clinical Summary Darlene Wilcox is a 61 y.o.female  seen today for follow up of the following medical problems.    1. NICM - 07/2016 echo LVEF 40-45% - 01/2017 LVEF 50-55% - Cardiac catheterization performed on 08/08/2016 showed 10% RCA, normal LVEDP, EF 40%. No significant coronary artery disease seen. - medical therapy limited by soft bp's     06/2019 echo: LVEF 50-55% 11/2021 echo: LVEF 30-35%, grade I dd - chronic SOB better with inhalers, some cough and wheeze. Occasional LE edema in legs     12/2021 RHC/LHC: normal LM, mid LAD 80%, normal LCX, large RCA patent. Disease does not account for her systolic dysfunction. Mean PA 16, PCWP 9, CI 2.6     2. CAD - 12/2021 RHC/LHC: normal LM, mid LAD 80%, normal LCX, large RCA patent. Disease does not account for her systolic dysfunction. The LAD could be treated with PCI at some risk of closing a large diagonal which is equally large when compared to the LAD beyond the bifurcation.  Recommend medical therapy of angina and if quality of life altering, she could undergo PCI of the LAD but again is noted with risk of diagonal occlusion          Other medical issues not addressed this visit   2. History of chest pain Left sided stabbing left sided. Can occur at any time. Can go into left arm. Can last up to 24 hours without relief. Not positional   12/2019 nuclear stress: apical thinning, cannot completely exclude very mild apical ischemia.    Denies any recent chest pains.      3. Hyperliidemia -11/2018 collected 6pm, likely not fasting.    02/2020 TC 165 TG 336 HDL 32 LDL 85 - reports high soda intake, sweets   10/2021 TC 236 TG 391 HDL 40 LDL 118   4. Sinus tachycardia - some recent diarrhea, 1-2 loose BMs per day for a few months. Some dry heaving at times.  - poor oral hydration. Mountain dew bottles x 4-6 bottles. - chronic back pain, typically 4-8/10 in severity.      5. COPD - remote PFTs - only on prn  albuterol       SH: mother is Tempie Hoist, also a pateint of mine Past Medical History:  Diagnosis Date   Anxiety    Asthma    daily inhaler   Bipolar disorder ([place])    Bronchitis due to tobacco use 07/2016   Carpal tunnel syndrome of right wrist 12/2012   CHF (congestive heart failure) (HCC)    Chronic back pain greater than 3 months duration    Depression    Full dentures    GERD (gastroesophageal reflux disease)    Headache(784.0)    1-2 x/month   History of ankle sprain    right; 2 weeks ago;  c/o severe pain   Hypertension    Seizures ([place])    states "silent seizures"; is on anticonvulsant; none in 2-3 mos.     Allergies  Allergen Reactions   Apple Juice Anaphylaxis and Shortness Of Breath   Penicillins Anaphylaxis, Shortness Of Breath, Itching and Swelling    Has patient had a PCN reaction causing immediate rash, facial/tongue/throat swelling, SOB or lightheadedness with hypotension: Unknown, childhood reaction Has patient had a PCN reaction causing severe rash involving mucus membranes or skin necrosis: No Has patient had a PCN reaction that required hospitalization No Has patient had a PCN reaction occurring within the last 10  years: No If all of the above answers are "NO", then may proceed with Cephalosporin use.    Bee Venom Swelling    Takes benadryl to help with reaction     Current Outpatient Medications  Medication Sig Dispense Refill   aspirin EC 81 MG tablet Take 81 mg by mouth daily.     b complex vitamins capsule Take 1 capsule by mouth daily.     benztropine (COGENTIN) 1 MG tablet Take 1 mg by mouth at bedtime.     bisoprolol (ZEBETA) 5 MG tablet Take 0.5 tablets (2.5 mg total) by mouth daily. 45 tablet 1   clonazePAM (KLONOPIN) 0.5 MG tablet Take 0.5 mg by mouth 2 (two) times daily.     Cyanocobalamin (VITAMIN B-12 PO) Take 1 tablet by mouth daily.     cyclobenzaprine (FLEXERIL) 10 MG tablet Take 10 mg by mouth 2 (two) times daily as needed for  muscle spasms.     diclofenac Sodium (VOLTAREN) 1 % GEL Apply 1 Application topically daily as needed for pain.     diphenhydrAMINE (BENADRYL) 25 MG tablet Take 1 tablet (25 mg total) by mouth every 6 (six) hours as needed for itching. 30 tablet 0   ferrous sulfate 324 MG TBEC Take 324 mg by mouth daily with breakfast.     ibuprofen (ADVIL) 600 MG tablet Take 1 tablet (600 mg total) by mouth every 6 (six) hours as needed. (Patient taking differently: Take 200 mg by mouth every 6 (six) hours as needed for headache, mild pain or moderate pain.) 30 tablet 0   INGREZZA 80 MG CAPS Take 80 mg by mouth at bedtime.     isosorbide mononitrate (IMDUR) 30 MG 24 hr tablet Take 1 tablet (30 mg total) by mouth daily. 30 tablet 11   Lurasidone HCl 120 MG TABS Take 120 mg by mouth at bedtime.     MYRBETRIQ 50 MG TB24 tablet Take 50 mg by mouth daily.     nicotine polacrilex (NICORETTE) 2 MG gum Take 2 mg by mouth as needed for smoking cessation.     nitroGLYCERIN (NITROSTAT) 0.4 MG SL tablet PLACE 1 TAB UNDER TONGUE EVERY 3 MINUTES AS DIRECTED UP TO 3 TIMES AS NEEDED FOR CHEST PAIN. (Patient taking differently: Place 0.4 mg under the tongue every 5 (five) minutes as needed for chest pain. PLACE 1 TAB UNDER TONGUE EVERY 3 MINUTES AS DIRECTED UP TO 3 TIMES AS NEEDED FOR CHEST PAIN.) 25 tablet 3   oxyCODONE-acetaminophen (PERCOCET) 10-325 MG tablet Take 1 tablet by mouth in the morning, at noon, in the evening, and at bedtime.     prazosin (MINIPRESS) 2 MG capsule Take 2 mg by mouth at bedtime.     PROAIR HFA 108 (90 Base) MCG/ACT inhaler INHALE 2 PUFFS INTO THE LUNGS EVERY 4 HOURS AS NEEDED FOR WHEEZING ORSHORTNESS OF BREATH. (Patient taking differently: Inhale 1-2 puffs into the lungs every 6 (six) hours as needed for wheezing or shortness of breath.) 8.5 g 0   sacubitril-valsartan (ENTRESTO) 24-26 MG Take 1 tablet by mouth 2 (two) times daily. 60 tablet 6   traZODone (DESYREL) 100 MG tablet Take 200 mg by mouth at  bedtime.     vitamin E 180 MG (400 UNITS) capsule Take 400 Units by mouth 2 (two) times daily.     Current Facility-Administered Medications  Medication Dose Route Frequency Provider Last Rate Last Admin   sodium chloride flush (NS) 0.9 % injection 3 mL  3 mL Intravenous  Q12H Arnoldo Lenis, MD         Past Surgical History:  Procedure Laterality Date   ABDOMINAL HYSTERECTOMY     partial   APPENDECTOMY     BUNIONECTOMY Left    CARPAL TUNNEL RELEASE Right 01/13/2013   Procedure: CARPAL TUNNEL RELEASE;  Surgeon: Cammie Sickle., MD;  Location: [facility];  Service: Orthopedics;  Laterality: Right;   CERVICAL FUSION     x 2   DIAGNOSTIC LAPAROSCOPY  11/08/2006   INGUINAL HERNIA REPAIR Right    LAPAROSCOPIC APPENDECTOMY  11/08/2006   LEFT HEART CATH AND CORONARY ANGIOGRAPHY N/A 08/08/2016   Procedure: Left Heart Cath and Coronary Angiography;  Surgeon: Wellington Hampshire, MD;  Location: [facility];  Service: Cardiovascular;  Laterality: N/A;   LUMBAR FUSION  06/28/2014   L2-5   LUMBAR LAMINECTOMY  02/05/2007   L4-5 fusion   RIGHT/LEFT HEART CATH AND CORONARY ANGIOGRAPHY N/A 01/04/2022   Procedure: RIGHT/LEFT HEART CATH AND CORONARY ANGIOGRAPHY;  Surgeon: Belva Crome, MD;  Location: [facility];  Service: Cardiovascular;  Laterality: N/A;   TUBAL LIGATION       Allergies  Allergen Reactions   Apple Juice Anaphylaxis and Shortness Of Breath   Penicillins Anaphylaxis, Shortness Of Breath, Itching and Swelling    Has patient had a PCN reaction causing immediate rash, facial/tongue/throat swelling, SOB or lightheadedness with hypotension: Unknown, childhood reaction Has patient had a PCN reaction causing severe rash involving mucus membranes or skin necrosis: No Has patient had a PCN reaction that required hospitalization No Has patient had a PCN reaction occurring within the last 10 years: No If all of the above answers are "NO", then may proceed with  Cephalosporin use.    Bee Venom Swelling    Takes benadryl to help with reaction      Family History  Problem Relation Age of Onset   Lung cancer Father    Diabetes Maternal Grandmother    Heart attack Maternal Grandmother    Deep vein thrombosis Neg Hx      Social History Darlene Wilcox reports that she has been smoking cigarettes. She started smoking about 47 years ago. She has a 20.00 pack-year smoking history. She has never used smokeless tobacco. Darlene Wilcox reports no history of alcohol use.   Review of Systems CONSTITUTIONAL: No weight loss, fever, chills, weakness or fatigue.  HEENT: Eyes: No visual loss, blurred vision, double vision or yellow sclerae.No hearing loss, sneezing, congestion, runny nose or sore throat.  SKIN: No rash or itching.  CARDIOVASCULAR: per hpi RESPIRATORY: No shortness of breath, cough or sputum.  GASTROINTESTINAL: No anorexia, nausea, vomiting or diarrhea. No abdominal pain or blood.  GENITOURINARY: No burning on urination, no polyuria NEUROLOGICAL: No headache, dizziness, syncope, paralysis, ataxia, numbness or tingling in the extremities. No change in bowel or bladder control.  MUSCULOSKELETAL: No muscle, back pain, joint pain or stiffness.  LYMPHATICS: No enlarged nodes. No history of splenectomy.  PSYCHIATRIC: No history of depression or anxiety.  ENDOCRINOLOGIC: No reports of sweating, cold or heat intolerance. No polyuria or polydipsia.  Marland Kitchen   Physical Examination Today's Vitals   01/24/22 1153  BP: 130/88  Pulse: 96  SpO2: 96%  Weight: 182 lb (82.6 kg)  Height: '5\' 2"'$  (1.575 m)   Body mass index is 33.29 kg/m.  Gen: resting comfortably, no acute distress HEENT: no scleral icterus, pupils equal round and reactive, no palptable cervical adenopathy,  CV: RRR,  no m/r/g no jvd Resp: Clear to auscultation bilaterally GI: abdomen is soft, non-tender, non-distended, normal bowel sounds, no hepatosplenomegaly MSK: extremities are warm,  no edema.  Skin: warm, no rash Neuro:  no focal deficits Psych: appropriate affect   Diagnostic Studies  Echo 08/06/2016 LV EF: 40% -   45%   Study Conclusions   - Left ventricle: The cavity size was normal. Wall thickness was   normal. Systolic function was mildly to moderately reduced. The   estimated ejection fraction was in the range of 40% to 45%.   Diffuse hypokinesis. - Mitral valve: There was mild regurgitation. - Pulmonary arteries: Systolic pressure was mildly increased. PA   peak pressure: 31 mm Hg (S).         Cath 08/08/2016 Conclusion      Mid RCA lesion, 10 %stenosed. LV end diastolic pressure is normal. There is mild to moderate left ventricular systolic dysfunction. There is no mitral valve regurgitation.   1. Near normal coronary arteries with no evidence of obstructive disease. 2. Mild to moderately reduced LV systolic function with an ejection fraction of 40% with global hypokinesis. 3. Normal left ventricular end-diastolic pressure.   Recommendations: Continue medical therapy for nonischemic cardiomyopathy. The patient does not require further intravenous diuresis and I switched her to small dose oral furosemide. Right heart catheterization was planned. However, it could not be done via the right antecubital area due to cellulitis and the wire could not be advanced via the left antecubital vein. Given that her LVEDP was normal, I elected not to pursue a right heart catheterization. There was no evidence of pulmonary hypertension on echo.      06/2019 echo IMPRESSIONS     1. Left ventricular ejection fraction, by estimation, is 50 to 55%. The  left ventricle has low normal function. The left ventrical has no regional  wall motion abnormalities. Left ventricular diastolic parameters were  normal.   2. Right ventricular systolic function is normal. The right ventricular  size is normal. There is normal pulmonary artery systolic pressure. The   estimated right ventricular systolic pressure is 10.6 mmHg.   3. Left atrial size was upper normal.   4. The mitral valve is grossly normal. Mild mitral valve regurgitation.   5. The aortic valve is tricuspid. Aortic valve regurgitation is not  visualized.   6. The inferior vena cava is normal in size with greater than 50%  respiratory variability, suggesting right atrial pressure of 3 mmHg.      12/2019 nuclear stress There was no ST segment deviation noted during stress. This is a low risk study. The left ventricular ejection fraction is low normal (50%). Small mild apical defect likely due to slight differences in apical thinning. Cannot completely exclude small apical infarct with very mild peri-infarct ischemia. Either finding would support low risk.     11/2021 echo 1. Left ventricular ejection fraction, by estimation, is 30 to 35%. The  left ventricle has moderately decreased function. The left ventricle  demonstrates global hypokinesis. The left ventricular internal cavity size  was mildly dilated. There is mild  left ventricular hypertrophy. Left ventricular diastolic parameters are  consistent with Grade I diastolic dysfunction (impaired relaxation). The  average left ventricular global longitudinal strain is -10.0 %. The global  longitudinal strain is abnormal.   2. Right ventricular systolic function is normal. The right ventricular  size is normal. The estimated right ventricular systolic pressure is 7.2  mmHg.   3. The mitral valve  is grossly normal. Trivial mitral valve  regurgitation. No evidence of mitral stenosis.   4. The aortic valve is tricuspid. Aortic valve regurgitation is not  visualized. No aortic stenosis is present.   5. The inferior vena cava is normal in size with greater than 50%  respiratory variability, suggesting right atrial pressure of 3 mmHg.    12/2021 RHC/LHC Conclusions: Normal left main LAD does not wrap around the apex.  In the mid to  distal segment in the mid third of the LAD there is an 80 to 85% stenosis just distal to an equally large diagonal.  This warms a [place] 110 bifurcation stenosis.  LAD is otherwise normal. Circumflex is large, normal, and the large first marginal wraps around the left ventricular apex. The RCA is large.  The PDA supplies the entire inferior interventricular groove and goes to the apex. Severe systolic dysfunction with EF 25 to 30% but normal hemodynamics   RECOMMENDATIONS: Chronic coronary disease not severe enough to account for her severe cardiomyopathy.  The LAD could be treated with PCI at some risk of closing a large diagonal which is equally large when compared to the LAD beyond the bifurcation.  Recommend medical therapy of angina and if quality of life altering, she could undergo PCI of the LAD but again is noted with risk of diagonal occlusion Coronaries otherwise normal. Right heart pressures are normal.  The wedge pressure is 9 mmHg.  LVEDP is 21 mmHg.  The cardiac output is 4.8 to liters per minute.  Resistance is not calculated and Wood units.   Assessment and Plan  1. Chronic systolic HF - prior mild LV dysfunction that normalized however by recent echo recurrent dysfunction, LVEF down to 30-35% -add aldactone 12.'5mg'$  daily, farxiga '10mg'$  daily. Check bmet 2 weeks - nursing visit 3 weeks, continue to titrate meds  2. CAD - LAD disease as mentioed above, managed medically - add atorva '20mg'$  daily.         Arnoldo Lenis, M.D.

## 2022-02-14 ENCOUNTER — Encounter: Payer: Self-pay | Admitting: *Deleted

## 2022-02-14 ENCOUNTER — Telehealth: Payer: Self-pay | Admitting: *Deleted

## 2022-02-14 ENCOUNTER — Ambulatory Visit: Payer: 59 | Attending: Cardiology | Admitting: *Deleted

## 2022-02-14 VITALS — BP 92/61 | HR 76 | Ht 62.0 in | Wt 181.4 lb

## 2022-02-14 DIAGNOSIS — Z79899 Other long term (current) drug therapy: Secondary | ICD-10-CM

## 2022-02-14 DIAGNOSIS — I5022 Chronic systolic (congestive) heart failure: Secondary | ICD-10-CM

## 2022-02-14 NOTE — Telephone Encounter (Signed)
Patient informed and verbalized understanding of plan. 

## 2022-02-14 NOTE — Patient Instructions (Signed)
Continue same medications and plan for now We will contact you with any changes after your visit has been reviewed by your provider.

## 2022-02-14 NOTE — Progress Notes (Signed)
Low bp's, can she stop aldactone for now. Can she repeat a nursing visit in 2 weeks to reassess vitals  Zandra Abts MD

## 2022-02-14 NOTE — Progress Notes (Signed)
Presents to office for vital sign re-check per recent office visit request due to medication changes. See below: -add aldactone 12.'5mg'$  daily, farxiga '10mg'$  daily. Check bmet 2 weeks - nursing visit 3 weeks, continue to titrate meds Medications reviewed. Reports taking all medications as prescribed without missing doses or having any side effects. Denies dizziness, chest pain or SOB. Vitals taken and routed to provider for review.

## 2022-02-14 NOTE — Telephone Encounter (Addendum)
-----   Message from Arnoldo Lenis, MD sent at 02/14/2022 10:07 AM EDT -----  Low bp's, can she stop aldactone for now. Can she repeat a nursing visit in 2 weeks to reassess vitals   Zandra Abts MD

## 2022-02-20 ENCOUNTER — Ambulatory Visit: Payer: 59 | Admitting: Cardiology

## 2022-03-01 ENCOUNTER — Ambulatory Visit: Payer: 59

## 2022-03-06 ENCOUNTER — Ambulatory Visit: Payer: 59 | Attending: Cardiology | Admitting: *Deleted

## 2022-03-06 VITALS — BP 100/70 | HR 73 | Wt 184.0 lb

## 2022-03-06 DIAGNOSIS — I10 Essential (primary) hypertension: Secondary | ICD-10-CM

## 2022-03-06 NOTE — Progress Notes (Signed)
Patient notified and verbalized understanding. 

## 2022-03-06 NOTE — Progress Notes (Signed)
Patient in office for vitals check as Aldactone was stopped on 02/14/2022 due to low BP's.  No c/o dizziness.

## 2022-03-06 NOTE — Progress Notes (Signed)
BP's are low normal but ok and improved from prior check  Zandra Abts MD

## 2022-03-23 ENCOUNTER — Telehealth: Payer: Self-pay | Admitting: Cardiology

## 2022-03-23 NOTE — Telephone Encounter (Signed)
Patient made aware that she was taken off of the aldactone, verbalized understanding.

## 2022-03-23 NOTE — Telephone Encounter (Signed)
  Pt is calling to clarify which medication she needs to stop taking

## 2022-03-26 ENCOUNTER — Telehealth: Payer: Self-pay | Admitting: Cardiology

## 2022-03-26 MED ORDER — BISOPROLOL FUMARATE 5 MG PO TABS: 2.5000 mg | ORAL_TABLET | Freq: Every day | ORAL | 1 refills | Status: DC

## 2022-03-26 NOTE — Telephone Encounter (Signed)
*  STAT* If patient is at the pharmacy, call can be transferred to refill team.   1. Which medications need to be refilled? (please list name of each medication and dose if known)  bisoprolol (ZEBETA) 5 MG tablet  2. Which pharmacy/location (including street and city if local pharmacy) is medication to be sent to? [facility], [place]  3. Do they need a 30 day or 90 day supply? [place] [facility]

## 2022-04-23 ENCOUNTER — Telehealth: Payer: Self-pay | Admitting: Cardiology

## 2022-04-23 MED ORDER — ENTRESTO 24-26 MG PO TABS: 1.0000 | ORAL_TABLET | Freq: Two times a day (BID) | ORAL | 3 refills | Status: DC

## 2022-04-23 MED ORDER — ISOSORBIDE MONONITRATE ER 30 MG PO TB24: 30.0000 mg | ORAL_TABLET | Freq: Every day | ORAL | 3 refills | Status: DC

## 2022-04-23 NOTE — Telephone Encounter (Signed)
[facility] contacted, spoke with Dorothea Ogle, and advised that refills already sent. Contacted patient and she request 90 day supply for medications. Advised that all have 90 day supple except for imdur and entresto. Advised that 90 day supply will be sent

## 2022-04-23 NOTE — Telephone Encounter (Signed)
*  STAT* If patient is at the pharmacy, call can be transferred to refill team.   1. Which medications need to be refilled? (please list name of each medication and dose if known)   bisoprolol (ZEBETA) 5 MG tablet    dapagliflozin propanediol (FARXIGA) 10 MG TABS tablet   isosorbide mononitrate (IMDUR) 30 MG 24 hr tablet  sacubitril-valsartan (ENTRESTO) 24-26 MG  atorvastatin (LIPITOR) 20 MG tablet    2. Which pharmacy/location (including street and city if local pharmacy) is medication to be sent to?  [place], [place]    3. Do they need a 30 day or 90 day supply? 30 day  Patient is completely out of medication and her insurance will not pay for the 10 day supply so she needs it sent in for 30 days.

## 2022-04-26 ENCOUNTER — Ambulatory Visit: Payer: 59 | Attending: Cardiology | Admitting: Cardiology

## 2022-04-26 ENCOUNTER — Encounter: Payer: Self-pay | Admitting: Cardiology

## 2022-04-26 VITALS — BP 126/76 | HR 69 | Ht 62.0 in | Wt 184.0 lb

## 2022-04-26 DIAGNOSIS — I5022 Chronic systolic (congestive) heart failure: Secondary | ICD-10-CM | POA: Diagnosis not present

## 2022-04-26 DIAGNOSIS — I2510 Atherosclerotic heart disease of native coronary artery without angina pectoris: Secondary | ICD-10-CM | POA: Diagnosis not present

## 2022-04-26 MED ORDER — ASPIRIN 81 MG PO TBEC: 81.0000 mg | DELAYED_RELEASE_TABLET | Freq: Every day | ORAL | Status: DC

## 2022-04-26 MED ORDER — DAPAGLIFLOZIN PROPANEDIOL 10 MG PO TABS: 10.0000 mg | ORAL_TABLET | Freq: Every day | ORAL | 3 refills | Status: DC

## 2022-04-26 MED ORDER — BISOPROLOL FUMARATE 5 MG PO TABS: 2.5000 mg | ORAL_TABLET | Freq: Every day | ORAL | 3 refills | Status: DC

## 2022-04-26 NOTE — Progress Notes (Signed)
Clinical Summary Darlene Wilcox is a 61 y.o.female seen today for follow up of the following medical problems.    1. NICM - 07/2016 echo LVEF 40-45% - 01/2017 LVEF 50-55% - Cardiac catheterization performed on 08/08/2016 showed 10% RCA, normal LVEDP, EF 40%. No significant coronary artery disease seen. - medical therapy limited by soft bp's     06/2019 echo: LVEF 50-55% 11/2021 echo: LVEF 30-35%, grade I dd - chronic SOB better with inhalers, some cough and wheeze. Occasional LE edema in legs     12/2021 RHC/LHC: normal LM, mid LAD 80%, normal LCX, large RCA patent. Disease does not account for her systolic dysfunction. Mean PA 16, PCWP 9, CI 2.6 The LAD could be treated with PCI at some risk of closing a large diagonal which is equally large when compared to the LAD beyond the bifurcation. Recommend medical therapy of angina and if quality of life altering, she could undergo PCI of the LAD but again is noted with risk of diagonal occlusion      - low bp's, we stopped aldactone. BP was 90/60 at nursing visit -compliant with meds - some SOB at times, some cough and wheezing -no recent edema, weights are stable.      2. CAD - 12/2021 RHC/LHC: normal LM, mid LAD 80%, normal LCX, large RCA patent. Disease does not account for her systolic dysfunction. The LAD could be treated with PCI at some risk of closing a large diagonal which is equally large when compared to the LAD beyond the bifurcation.  Recommend medical therapy of angina and if quality of life altering, she could undergo PCI of the LAD but again is noted with risk of diagonal occlusion      - no chest pains         Other medical issues not addressed this visit   2. History of chest pain Left sided stabbing left sided. Can occur at any time. Can go into left arm. Can last up to 24 hours without relief. Not positional   12/2019 nuclear stress: apical thinning, cannot completely exclude very mild apical ischemia.    Denies  any recent chest pains.      3. Hyperliidemia -11/2018 collected 6pm, likely not fasting.    02/2020 TC 165 TG 336 HDL 32 LDL 85 - reports high soda intake, sweets   10/2021 TC 236 TG 391 HDL 40 LDL 118     4. Sinus tachycardia - some recent diarrhea, 1-2 loose BMs per day for a few months. Some dry heaving at times.  - poor oral hydration. Mountain dew bottles x 4-6 bottles. - chronic back pain, typically 4-8/10 in severity.      5. COPD - remote PFTs - only on prn albuterol       SH: mother is Tempie Hoist, also a pateint of mine  Past Medical History:  Diagnosis Date   Anxiety    Asthma    daily inhaler   Bipolar disorder ([place])    Bronchitis due to tobacco use 07/2016   Carpal tunnel syndrome of right wrist 12/2012   CHF (congestive heart failure) (HCC)    Chronic back pain greater than 3 months duration    Depression    Full dentures    GERD (gastroesophageal reflux disease)    Headache(784.0)    1-2 x/month   History of ankle sprain    right; 2 weeks ago;  c/o severe pain   Hypertension    Seizures (  [place])    states "silent seizures"; is on anticonvulsant; none in 2-3 mos.     Allergies  Allergen Reactions   Apple Juice Anaphylaxis and Shortness Of Breath   Penicillins Anaphylaxis, Shortness Of Breath, Itching and Swelling    Has patient had a PCN reaction causing immediate rash, facial/tongue/throat swelling, SOB or lightheadedness with hypotension: Unknown, childhood reaction Has patient had a PCN reaction causing severe rash involving mucus membranes or skin necrosis: No Has patient had a PCN reaction that required hospitalization No Has patient had a PCN reaction occurring within the last 10 years: No If all of the above answers are "NO", then may proceed with Cephalosporin use.    Bee Venom Swelling    Takes benadryl to help with reaction     Current Outpatient Medications  Medication Sig Dispense Refill   aspirin EC 81 MG tablet Take 81 mg by  mouth daily.     atorvastatin (LIPITOR) 20 MG tablet Take 1 tablet (20 mg total) by mouth daily. 90 tablet 1   b complex vitamins capsule Take 1 capsule by mouth daily.     benztropine (COGENTIN) 1 MG tablet Take 1 mg by mouth at bedtime.     bisoprolol (ZEBETA) 5 MG tablet Take 0.5 tablets (2.5 mg total) by mouth daily. 45 tablet 1   busPIRone (BUSPAR) 15 MG tablet Take 15 mg by mouth 2 (two) times daily.     clonazePAM (KLONOPIN) 0.5 MG tablet Take 0.5 mg by mouth 2 (two) times daily.     Cyanocobalamin (VITAMIN B-12 PO) Take 1 tablet by mouth daily.     cyclobenzaprine (FLEXERIL) 10 MG tablet Take 10 mg by mouth 2 (two) times daily as needed for muscle spasms.     dapagliflozin propanediol (FARXIGA) 10 MG TABS tablet Take 1 tablet (10 mg total) by mouth daily before breakfast. 90 tablet 1   diclofenac Sodium (VOLTAREN) 1 % GEL Apply 1 Application topically daily as needed for pain.     dimenhyDRINATE (DRAMAMINE) 50 MG tablet Take 50 mg by mouth every 8 (eight) hours as needed.     diphenhydrAMINE (BENADRYL) 25 MG tablet Take 1 tablet (25 mg total) by mouth every 6 (six) hours as needed for itching. 30 tablet 0   ferrous sulfate 324 MG TBEC Take 324 mg by mouth daily with breakfast.     ibuprofen (ADVIL) 600 MG tablet Take 1 tablet (600 mg total) by mouth every 6 (six) hours as needed. (Patient taking differently: Take 200 mg by mouth every 6 (six) hours as needed for headache, mild pain or moderate pain.) 30 tablet 0   INGREZZA 80 MG CAPS Take 80 mg by mouth at bedtime.     isosorbide mononitrate (IMDUR) 30 MG 24 hr tablet Take 1 tablet (30 mg total) by mouth daily. 90 tablet 3   Lurasidone HCl 120 MG TABS Take 120 mg by mouth at bedtime.     MYRBETRIQ 50 MG TB24 tablet Take 50 mg by mouth daily.     nicotine polacrilex (NICORETTE) 2 MG gum Take 2 mg by mouth as needed for smoking cessation.     nitroGLYCERIN (NITROSTAT) 0.4 MG SL tablet PLACE 1 TAB UNDER TONGUE EVERY 3 MINUTES AS DIRECTED UP  TO 3 TIMES AS NEEDED FOR CHEST PAIN. (Patient taking differently: Place 0.4 mg under the tongue every 5 (five) minutes as needed for chest pain. PLACE 1 TAB UNDER TONGUE EVERY 3 MINUTES AS DIRECTED UP TO 3 TIMES AS NEEDED  FOR CHEST PAIN.) 25 tablet 3   oxyCODONE-acetaminophen (PERCOCET) 10-325 MG tablet Take 1 tablet by mouth in the morning, at noon, in the evening, and at bedtime.     prazosin (MINIPRESS) 2 MG capsule Take 2 mg by mouth at bedtime.     PROAIR HFA 108 (90 Base) MCG/ACT inhaler INHALE 2 PUFFS INTO THE LUNGS EVERY 4 HOURS AS NEEDED FOR WHEEZING ORSHORTNESS OF BREATH. (Patient taking differently: Inhale 1-2 puffs into the lungs every 6 (six) hours as needed for wheezing or shortness of breath.) 8.5 g 0   sacubitril-valsartan (ENTRESTO) 24-26 MG Take 1 tablet by mouth 2 (two) times daily. 180 tablet 3   traZODone (DESYREL) 100 MG tablet Take 200 mg by mouth at bedtime.     vitamin E 180 MG (400 UNITS) capsule Take 400 Units by mouth 2 (two) times daily.     Current Facility-Administered Medications  Medication Dose Route Frequency Provider Last Rate Last Admin   sodium chloride flush (NS) 0.9 % injection 3 mL  3 mL Intravenous Q12H Arnoldo Lenis, MD         Past Surgical History:  Procedure Laterality Date   ABDOMINAL HYSTERECTOMY     partial   APPENDECTOMY     BUNIONECTOMY Left    CARPAL TUNNEL RELEASE Right 01/13/2013   Procedure: CARPAL TUNNEL RELEASE;  Surgeon: Cammie Sickle., MD;  Location: [facility];  Service: Orthopedics;  Laterality: Right;   CERVICAL FUSION     x 2   DIAGNOSTIC LAPAROSCOPY  11/08/2006   INGUINAL HERNIA REPAIR Right    LAPAROSCOPIC APPENDECTOMY  11/08/2006   LEFT HEART CATH AND CORONARY ANGIOGRAPHY N/A 08/08/2016   Procedure: Left Heart Cath and Coronary Angiography;  Surgeon: Wellington Hampshire, MD;  Location: [facility];  Service: Cardiovascular;  Laterality: N/A;   LUMBAR FUSION  06/28/2014   L2-5   LUMBAR LAMINECTOMY   02/05/2007   L4-5 fusion   RIGHT/LEFT HEART CATH AND CORONARY ANGIOGRAPHY N/A 01/04/2022   Procedure: RIGHT/LEFT HEART CATH AND CORONARY ANGIOGRAPHY;  Surgeon: Belva Crome, MD;  Location: [facility];  Service: Cardiovascular;  Laterality: N/A;   TUBAL LIGATION       Allergies  Allergen Reactions   Apple Juice Anaphylaxis and Shortness Of Breath   Penicillins Anaphylaxis, Shortness Of Breath, Itching and Swelling    Has patient had a PCN reaction causing immediate rash, facial/tongue/throat swelling, SOB or lightheadedness with hypotension: Unknown, childhood reaction Has patient had a PCN reaction causing severe rash involving mucus membranes or skin necrosis: No Has patient had a PCN reaction that required hospitalization No Has patient had a PCN reaction occurring within the last 10 years: No If all of the above answers are "NO", then may proceed with Cephalosporin use.    Bee Venom Swelling    Takes benadryl to help with reaction      Family History  Problem Relation Age of Onset   Lung cancer Father    Diabetes Maternal Grandmother    Heart attack Maternal Grandmother    Deep vein thrombosis Neg Hx      Social History Ms. Bisher reports that she has been smoking cigarettes. She started smoking about 47 years ago. She has a 20.00 pack-year smoking history. She has never used smokeless tobacco. Ms. Gionfriddo reports no history of alcohol use.   Review of Systems CONSTITUTIONAL: No weight loss, fever, chills, weakness or fatigue.  HEENT: Eyes: No visual loss,  blurred vision, double vision or yellow sclerae.No hearing loss, sneezing, congestion, runny nose or sore throat.  SKIN: No rash or itching.  CARDIOVASCULAR: per hpi RESPIRATORY: No shortness of breath, cough or sputum.  GASTROINTESTINAL: No anorexia, nausea, vomiting or diarrhea. No abdominal pain or blood.  GENITOURINARY: No burning on urination, no polyuria NEUROLOGICAL: No headache, dizziness, syncope,  paralysis, ataxia, numbness or tingling in the extremities. No change in bowel or bladder control.  MUSCULOSKELETAL: No muscle, back pain, joint pain or stiffness.  LYMPHATICS: No enlarged nodes. No history of splenectomy.  PSYCHIATRIC: No history of depression or anxiety.  ENDOCRINOLOGIC: No reports of sweating, cold or heat intolerance. No polyuria or polydipsia.  Marland Kitchen   Physical Examination Today's Vitals   04/26/22 0854  BP: 126/76  Pulse: 69  SpO2: 96%  Weight: 184 lb (83.5 kg)  Height: '5\' 2"'$  (1.575 m)   Body mass index is 33.65 kg/m.  Gen: resting comfortably, no acute distress HEENT: no scleral icterus, pupils equal round and reactive, no palptable cervical adenopathy,  CV: RRR, no m/r/g no jvd Resp: Clear to auscultation bilaterally GI: abdomen is soft, non-tender, non-distended, normal bowel sounds, no hepatosplenomegaly MSK: extremities are warm, no edema.  Skin: warm, no rash Neuro:  no focal deficits Psych: appropriate affect   Diagnostic Studies  Echo 08/06/2016 LV EF: 40% -   45%   Study Conclusions   - Left ventricle: The cavity size was normal. Wall thickness was   normal. Systolic function was mildly to moderately reduced. The   estimated ejection fraction was in the range of 40% to 45%.   Diffuse hypokinesis. - Mitral valve: There was mild regurgitation. - Pulmonary arteries: Systolic pressure was mildly increased. PA   peak pressure: 31 mm Hg (S).         Cath 08/08/2016 Conclusion      Mid RCA lesion, 10 %stenosed. LV end diastolic pressure is normal. There is mild to moderate left ventricular systolic dysfunction. There is no mitral valve regurgitation.   1. Near normal coronary arteries with no evidence of obstructive disease. 2. Mild to moderately reduced LV systolic function with an ejection fraction of 40% with global hypokinesis. 3. Normal left ventricular end-diastolic pressure.   Recommendations: Continue medical therapy for  nonischemic cardiomyopathy. The patient does not require further intravenous diuresis and I switched her to small dose oral furosemide. Right heart catheterization was planned. However, it could not be done via the right antecubital area due to cellulitis and the wire could not be advanced via the left antecubital vein. Given that her LVEDP was normal, I elected not to pursue a right heart catheterization. There was no evidence of pulmonary hypertension on echo.      06/2019 echo IMPRESSIONS     1. Left ventricular ejection fraction, by estimation, is 50 to 55%. The  left ventricle has low normal function. The left ventrical has no regional  wall motion abnormalities. Left ventricular diastolic parameters were  normal.   2. Right ventricular systolic function is normal. The right ventricular  size is normal. There is normal pulmonary artery systolic pressure. The  estimated right ventricular systolic pressure is 78.2 mmHg.   3. Left atrial size was upper normal.   4. The mitral valve is grossly normal. Mild mitral valve regurgitation.   5. The aortic valve is tricuspid. Aortic valve regurgitation is not  visualized.   6. The inferior vena cava is normal in size with greater than 50%  respiratory variability, suggesting  right atrial pressure of 3 mmHg.      12/2019 nuclear stress There was no ST segment deviation noted during stress. This is a low risk study. The left ventricular ejection fraction is low normal (50%). Small mild apical defect likely due to slight differences in apical thinning. Cannot completely exclude small apical infarct with very mild peri-infarct ischemia. Either finding would support low risk.     11/2021 echo 1. Left ventricular ejection fraction, by estimation, is 30 to 35%. The  left ventricle has moderately decreased function. The left ventricle  demonstrates global hypokinesis. The left ventricular internal cavity size  was mildly dilated. There is mild   left ventricular hypertrophy. Left ventricular diastolic parameters are  consistent with Grade I diastolic dysfunction (impaired relaxation). The  average left ventricular global longitudinal strain is -10.0 %. The global  longitudinal strain is abnormal.   2. Right ventricular systolic function is normal. The right ventricular  size is normal. The estimated right ventricular systolic pressure is 7.2  mmHg.   3. The mitral valve is grossly normal. Trivial mitral valve  regurgitation. No evidence of mitral stenosis.   4. The aortic valve is tricuspid. Aortic valve regurgitation is not  visualized. No aortic stenosis is present.   5. The inferior vena cava is normal in size with greater than 50%  respiratory variability, suggesting right atrial pressure of 3 mmHg.      12/2021 RHC/LHC Conclusions: Normal left main LAD does not wrap around the apex.  In the mid to distal segment in the mid third of the LAD there is an 80 to 85% stenosis just distal to an equally large diagonal.  This warms a [place] 110 bifurcation stenosis.  LAD is otherwise normal. Circumflex is large, normal, and the large first marginal wraps around the left ventricular apex. The RCA is large.  The PDA supplies the entire inferior interventricular groove and goes to the apex. Severe systolic dysfunction with EF 25 to 30% but normal hemodynamics   RECOMMENDATIONS: Chronic coronary disease not severe enough to account for her severe cardiomyopathy.  The LAD could be treated with PCI at some risk of closing a large diagonal which is equally large when compared to the LAD beyond the bifurcation.  Recommend medical therapy of angina and if quality of life altering, she could undergo PCI of the LAD but again is noted with risk of diagonal occlusion Coronaries otherwise normal. Right heart pressures are normal.  The wedge pressure is 9 mmHg.  LVEDP is 21 mmHg.  The cardiac output is 4.8 to liters per minute.  Resistance is not  calculated and Wood units.     Assessment and Plan   1. Chronic systolic HF - prior mild LV dysfunction that normalized however by recent echo recurrent dysfunction, LVEF down to 30-35% -low bp's 90s/60s after aldactone added, we stopped and bp's improved.  - will repeat echo to reassess LVEF, on medical therapy 5 months now. Room to titrate bisprolol, not sure bp would tolerate higher entresto but could consider trying.    2. CAD - LAD disease as mentioed above, managed medically -no symptoms, continue current meds  F/u pending echo results   Arnoldo Lenis, M.D

## 2022-04-26 NOTE — Patient Instructions (Addendum)
Medication Instructions:  Bisoprolol & Farxiga refilled today  Begin Aspirin '81mg'$  daily  Continue all other medications.     Labwork: none  Testing/Procedures: Your physician has requested that you have an echocardiogram. Echocardiography is a painless test that uses sound waves to create images of your heart. It provides your doctor with information about the size and shape of your heart and how well your heart's chambers and valves are working. This procedure takes approximately one hour. There are no restrictions for this procedure. Please do NOT wear cologne, perfume, aftershave, or lotions (deodorant is allowed). Please arrive 15 minutes prior to your appointment time. Office will contact with results via phone, letter or mychart.     Follow-Up: Pending test results   Any Other Special Instructions Will Be Listed Below (If Applicable).   If you need a refill on your cardiac medications before your next appointment, please call your pharmacy.

## 2022-05-10 ENCOUNTER — Ambulatory Visit: Payer: 59 | Attending: Cardiology

## 2022-05-10 DIAGNOSIS — I5022 Chronic systolic (congestive) heart failure: Secondary | ICD-10-CM | POA: Diagnosis not present

## 2022-05-10 LAB — ECHOCARDIOGRAM COMPLETE
AR max vel: 2.18 cm2
AV Peak grad: 9.9 mmHg
Ao pk vel: 1.58 m/s
Area-P 1/2: 2.43 cm2
Calc EF: 49.3 %
MV M vel: 1.93 m/s
MV Peak grad: 14.9 mmHg
S' Lateral: 4.3 cm
Single Plane A2C EF: 53.2 %
Single Plane A4C EF: 44.7 %

## 2022-05-10 MED ORDER — PERFLUTREN LIPID MICROSPHERE
1.0000 mL | INTRAVENOUS | Status: AC | PRN
  Administered 2022-05-10: 1 mL via INTRAVENOUS

## 2022-05-30 ENCOUNTER — Telehealth: Payer: Self-pay | Admitting: Cardiology

## 2022-05-30 NOTE — Telephone Encounter (Signed)
Pt c/o medication issue:  1. Name of Medication: Unsure   2. How are you currently taking this medication (dosage and times per day)?   3. Are you having a reaction (difficulty breathing--STAT)?   4. What is your medication issue? Pt states that at her last appt she was told to discontinue a medication, but can not remember which and would like a call back to get clarification.

## 2022-05-30 NOTE — Telephone Encounter (Signed)
Advised that no medications were stopped at last visit, however; aldactone was stopped prior to visit due to low BP's. Verbalized understanding.

## 2022-05-30 NOTE — Telephone Encounter (Signed)
Spoke to pt and verbalized that provider did not d/c medications, but added ASA 81 mg tablets daily. Patient voiced understanding and had no further questions at this time.   Prior OV note:    Assessment and Plan    1. Chronic systolic HF - prior mild LV dysfunction that normalized however by recent echo recurrent dysfunction, LVEF down to 30-35% -low bp's 90s/60s after aldactone added, we stopped and bp's improved.  - will repeat echo to reassess LVEF, on medical therapy 5 months now. Room to titrate bisprolol, not sure bp would tolerate higher entresto but could consider trying.    2. CAD - LAD disease as mentioed above, managed medically -no symptoms, continue current meds   F/u pending echo results

## 2022-06-04 ENCOUNTER — Telehealth: Payer: Self-pay | Admitting: *Deleted

## 2022-06-04 NOTE — Telephone Encounter (Signed)
-----   Message from Arnoldo Lenis, MD sent at 06/01/2022 10:16 AM EST ----- Echo looks good, heart pumping function has improved and is now back in the normal range. No medicaiton changes are needed at this time, can f/u 6 months  Zandra Abts MD

## 2022-06-04 NOTE — Telephone Encounter (Signed)
Darlene DRONE, LPN 0/01/1978  2:21 AM EST Back to Top    Notified, copy to pcp.

## 2022-06-14 ENCOUNTER — Telehealth: Payer: Self-pay | Admitting: Cardiology

## 2022-06-14 MED ORDER — DAPAGLIFLOZIN PROPANEDIOL 10 MG PO TABS: 10.0000 mg | ORAL_TABLET | Freq: Every day | ORAL | 6 refills | Status: DC

## 2022-06-14 MED ORDER — BISOPROLOL FUMARATE 5 MG PO TABS: 2.5000 mg | ORAL_TABLET | Freq: Every day | ORAL | 6 refills | Status: DC

## 2022-06-14 MED ORDER — ATORVASTATIN CALCIUM 20 MG PO TABS: 20.0000 mg | ORAL_TABLET | Freq: Every day | ORAL | 6 refills | Status: DC

## 2022-06-14 NOTE — Telephone Encounter (Signed)
Done

## 2022-06-14 NOTE — Telephone Encounter (Signed)
*  STAT* If patient is at the pharmacy, call can be transferred to refill team.   1. Which medications need to be refilled? (please list name of each medication and dose if known) atorvastatin (LIPITOR) 20 MG tablet   bisoprolol (ZEBETA) 5 MG tablet   dapagliflozin propanediol (FARXIGA) 10 MG TABS tablet   2. Which pharmacy/location (including street and city if local pharmacy) is medication to be sent to?  [place], [place]    3. Do they need a 30 day or 90 day supply? [place]

## 2022-06-28 ENCOUNTER — Telehealth: Payer: Self-pay | Admitting: Cardiology

## 2022-06-28 NOTE — Telephone Encounter (Signed)
Patient Is calling to talk with Dr. Harl Bowie or nurse

## 2022-06-28 NOTE — Telephone Encounter (Signed)
No answer

## 2022-06-29 NOTE — Telephone Encounter (Signed)
Patient request current printed list of her medications.  Printed & mailed to patient at her request.

## 2022-08-21 ENCOUNTER — Other Ambulatory Visit: Payer: Self-pay | Admitting: Cardiology

## 2022-09-01 ENCOUNTER — Other Ambulatory Visit: Payer: Self-pay | Admitting: Orthopedic Surgery

## 2022-09-01 DIAGNOSIS — M48062 Spinal stenosis, lumbar region with neurogenic claudication: Secondary | ICD-10-CM

## 2022-09-01 DIAGNOSIS — M4326 Fusion of spine, lumbar region: Secondary | ICD-10-CM

## 2022-09-01 DIAGNOSIS — M5106 Intervertebral disc disorders with myelopathy, lumbar region: Secondary | ICD-10-CM

## 2022-09-18 ENCOUNTER — Ambulatory Visit
Admission: RE | Admit: 2022-09-18 | Discharge: 2022-09-18 | Disposition: A | Discharge status: Home / Self Care | Payer: 59 | Source: Ambulatory Visit | Attending: Orthopedic Surgery | Admitting: Orthopedic Surgery

## 2022-09-18 DIAGNOSIS — M5106 Intervertebral disc disorders with myelopathy, lumbar region: Secondary | ICD-10-CM

## 2022-09-18 DIAGNOSIS — M4326 Fusion of spine, lumbar region: Secondary | ICD-10-CM

## 2022-09-18 DIAGNOSIS — M48062 Spinal stenosis, lumbar region with neurogenic claudication: Secondary | ICD-10-CM

## 2022-09-18 MED ORDER — ONDANSETRON HCL 4 MG/2ML IJ SOLN: 4.0000 mg | Freq: Once | INTRAMUSCULAR | Status: DC | PRN

## 2022-09-18 MED ORDER — IOPAMIDOL (ISOVUE-M 200) INJECTION 41%
18.0000 mL | Freq: Once | INTRAMUSCULAR | Status: AC
  Administered 2022-09-18: 18 mL via INTRATHECAL

## 2022-09-18 MED ORDER — MEPERIDINE HCL 50 MG/ML IJ SOLN: 50.0000 mg | Freq: Once | INTRAMUSCULAR | Status: DC | PRN

## 2022-09-18 MED ORDER — DIAZEPAM 5 MG PO TABS: 10.0000 mg | ORAL_TABLET | Freq: Once | ORAL | Status: DC

## 2022-09-18 NOTE — Discharge Instructions (Signed)

## 2022-10-05 ENCOUNTER — Other Ambulatory Visit (HOSPITAL_COMMUNITY): Payer: Self-pay

## 2023-02-04 ENCOUNTER — Other Ambulatory Visit: Payer: Self-pay | Admitting: Cardiology

## 2023-02-05 ENCOUNTER — Telehealth: Payer: Self-pay | Admitting: Pharmacy Technician

## 2023-02-05 ENCOUNTER — Other Ambulatory Visit (HOSPITAL_COMMUNITY): Payer: Self-pay

## 2023-02-05 NOTE — Telephone Encounter (Signed)
Pharmacy Patient Advocate Encounter  Insurance verification completed.    The patient is insured through  rx Education officer, environmental for [employer]. Currently a quantity of 30 is a 30 day supply and the co-pay is $0.00 .  Received a fax stating prior auth needed but received paid claim. No prior auth needed at this time.   This test claim was processed through [facility]- copay amounts may vary at other pharmacies due to pharmacy/plan contracts, or as the patient moves through the different stages of their insurance plan.

## 2023-02-27 ENCOUNTER — Other Ambulatory Visit: Payer: Self-pay | Admitting: Cardiology

## 2023-03-07 ENCOUNTER — Ambulatory Visit: Payer: 59 | Admitting: Cardiology

## 2023-03-14 ENCOUNTER — Telehealth: Payer: Self-pay | Admitting: Cardiology

## 2023-03-14 MED ORDER — ISOSORBIDE MONONITRATE ER 30 MG PO TB24: 30.0000 mg | ORAL_TABLET | Freq: Every day | ORAL | 0 refills | Status: DC

## 2023-03-14 MED ORDER — ATORVASTATIN CALCIUM 20 MG PO TABS: 20.0000 mg | ORAL_TABLET | Freq: Every day | ORAL | 0 refills | Status: DC

## 2023-03-14 NOTE — Telephone Encounter (Signed)
1 Months Supply sent patient needs appointment for further refills 1st attempt

## 2023-03-14 NOTE — Telephone Encounter (Signed)
*  STAT* If patient is at the pharmacy, call can be transferred to refill team.   1. Which medications need to be refilled? (please list name of each medication and dose if known)   isosorbide mononitrate (IMDUR) 30 MG 24 hr tablet  atorvastatin (LIPITOR) 20 MG tablet   2. Would you like to learn more about the convenience, safety, & potential cost savings by using the [facility]?   3. Are you open to using the [facility] (Type [facility]. ).  4. Which pharmacy/location (including street and city if local pharmacy) is medication to be sent to?  [facility]  5. Do they need a 30 day or 90 day supply?    Caller (April) stated they will need to transfer patient's medication to [facility], [address] [place], [place] [address], ph# [phone], fax# [phone] to have patient's medication pill packed.

## 2023-03-26 ENCOUNTER — Ambulatory Visit: Payer: 59 | Admitting: Nurse Practitioner

## 2023-03-28 ENCOUNTER — Other Ambulatory Visit: Payer: Self-pay | Admitting: Cardiology

## 2023-04-02 ENCOUNTER — Other Ambulatory Visit: Payer: Self-pay | Admitting: Cardiology

## 2023-04-04 ENCOUNTER — Ambulatory Visit: Payer: 59 | Attending: Cardiology | Admitting: Nurse Practitioner

## 2023-04-04 ENCOUNTER — Encounter: Payer: Self-pay | Admitting: Nurse Practitioner

## 2023-04-04 VITALS — BP 128/70 | HR 75 | Ht 62.0 in | Wt 169.0 lb

## 2023-04-04 DIAGNOSIS — I2510 Atherosclerotic heart disease of native coronary artery without angina pectoris: Secondary | ICD-10-CM

## 2023-04-04 DIAGNOSIS — E669 Obesity, unspecified: Secondary | ICD-10-CM

## 2023-04-04 DIAGNOSIS — I5032 Chronic diastolic (congestive) heart failure: Secondary | ICD-10-CM | POA: Diagnosis not present

## 2023-04-04 DIAGNOSIS — E785 Hyperlipidemia, unspecified: Secondary | ICD-10-CM | POA: Diagnosis not present

## 2023-04-04 DIAGNOSIS — I10 Essential (primary) hypertension: Secondary | ICD-10-CM

## 2023-04-04 NOTE — Patient Instructions (Signed)

## 2023-04-04 NOTE — Progress Notes (Unsigned)
Cardiology Office Note:  .   Date:  04/04/2023 ID:  Darlene Wilcox, DOB [birth year], MRN [id number] PCP: [facility] Providers Cardiologist:  Dina Rich, MD { Click to update primary MD,subspecialty MD or APP then REFRESH:1}   History of Present Illness: .   Darlene Wilcox is a 62 y.o. female with a PMH of chronic systolic CHF/an ICM, CAD, hyperlipidemia, COPD, history of sinus tachycardia, hypertension, history of seizures, depression, bipolar disorder, who presents today for 1 year follow-up.  Last seen by Dr. Dina Rich on March 30, 2022.  Was overall doing well from a cardiac perspective.  Was compliant with her medication.  Echocardiogram was updated and revealed recovered EF at 50 to 55%.  Today she presents for follow-up.  She states she has been doing well.  Denies any recent changes to her health and denies any acute cardiac complaints or issues. Denies any chest pain, shortness of breath, palpitations, syncope, presyncope, dizziness, orthopnea, PND, swelling,  acute bleeding, or claudication.  She has lost 15 pounds since last office visit.  ROS: Negative.  See HPI.  Studies Reviewed: Marland Kitchen    EKG:  EKG Interpretation Date/Time:  Thursday April 04 2023 13:04:36 EST Ventricular Rate:  79 PR Interval:  176 QRS Duration:  86 QT Interval:  380 QTC Calculation: 435 R Axis:   4  Text Interpretation: Normal sinus rhythm When compared with ECG of 03-Dec-2018 14:21, Questionable change in initial forces of Septal leads Confirmed by Sharlene Dory [phone]) on 04/04/2023 1:17:42 PM   Echo 04/2022:  1. Left ventricular ejection fraction, by estimation, is 50 to 55%. The  left ventricle has low normal function. The left ventricle has no regional  wall motion abnormalities. The left ventricular internal cavity size was  mildly dilated. Left ventricular  diastolic parameters are consistent with Grade I diastolic  dysfunction  (impaired relaxation). The average left ventricular global longitudinal  strain is -20.3 %. The global longitudinal strain is normal.   2. Right ventricular systolic function is normal. The right ventricular  size is normal.   3. The mitral valve is grossly normal. No evidence of mitral valve  regurgitation. No evidence of mitral stenosis.   4. The aortic valve is grossly normal. Aortic valve regurgitation is not  visualized. No aortic stenosis is present.   5. The inferior vena cava is normal in size with greater than 50%  respiratory variability, suggesting right atrial pressure of 3 mmHg.   Comparison(s): Prior images reviewed side by side. Changes from prior  study are noted. LVEF improved from 35% to 50-55% now.  Right/left heart cath 12/2021:  Conclusions: Normal left main LAD does not wrap around the apex.  In the mid to distal segment in the mid third of the LAD there is an 80 to 85% stenosis just distal to an equally large diagonal.  This warms a Medina 110 bifurcation stenosis.  LAD is otherwise normal. Circumflex is large, normal, and the large first marginal wraps around the left ventricular apex. The RCA is large.  The PDA supplies the entire inferior interventricular groove and goes to the apex. Severe systolic dysfunction with EF 25 to 30% but normal hemodynamics   RECOMMENDATIONS: Chronic coronary disease not severe enough to account for her severe cardiomyopathy.  The LAD could be treated with PCI at some risk of closing a large diagonal which is equally large when compared to the LAD beyond the bifurcation.  Recommend medical therapy  of angina and if quality of life altering, she could undergo PCI of the LAD but again is noted with risk of diagonal occlusion Coronaries otherwise normal. Right heart pressures are normal.  The wedge pressure is 9 mmHg.  LVEDP is 21 mmHg.  The cardiac output is 4.8 to liters per minute.  Resistance is not calculated and Wood  units.  Lexiscan 12/2019:  There was no ST segment deviation noted during stress. This is a low risk study. The left ventricular ejection fraction is low normal (50%). Small mild apical defect likely due to slight differences in apical thinning. Cannot completely exclude small apical infarct with very mild peri-infarct ischemia. Either finding would support low risk. Risk Assessment/Calculations:       The 10-year ASCVD risk score (Arnett DK, et al., 2019) is: 10.5%   Values used to calculate the score:     Age: 59 years     Sex: Female     Is Non-Hispanic African American: No     Diabetic: No     Tobacco smoker: Yes     Systolic Blood Pressure: 128 mmHg     Is BP treated: Yes     HDL Cholesterol: 32 mg/dL     Total Cholesterol: 149 mg/dL      Physical Exam:   VS:  BP 128/70   Pulse 75   Ht 5\' 2"  (1.575 m)   Wt 169 lb (76.7 kg)   SpO2 98%   BMI 30.91 kg/m    Wt Readings from Last 3 Encounters:  04/04/23 169 lb (76.7 kg)  04/26/22 184 lb (83.5 kg)  03/06/22 184 lb (83.5 kg)    GEN: Obese, 62 y.o. female in no acute distress NECK: No JVD; No carotid bruits CARDIAC: S1/S2, RRR, no murmurs, rubs, gallops RESPIRATORY:  Clear to auscultation without rales, wheezing or rhonchi  EXTREMITIES:  No edema; No deformity   ASSESSMENT AND PLAN: .    HFimpEF Stage C, NYHA class I symptoms. Echocardiogram   CAD HLD     {Are you ordering a CV Procedure (e.g. stress test, cath, DCCV, TEE, etc)?   Press F2        :[id number]}  Dispo: ***  Signed, Sharlene Dory, NP

## 2023-04-12 ENCOUNTER — Other Ambulatory Visit: Payer: Self-pay | Admitting: Cardiology

## 2023-04-25 ENCOUNTER — Other Ambulatory Visit: Payer: Self-pay

## 2023-04-25 ENCOUNTER — Emergency Department (HOSPITAL_COMMUNITY): Payer: 59

## 2023-04-25 ENCOUNTER — Encounter (HOSPITAL_COMMUNITY): Payer: Self-pay | Admitting: Emergency Medicine

## 2023-04-25 ENCOUNTER — Emergency Department (HOSPITAL_COMMUNITY)
Admission: EM | Admit: 2023-04-25 | Discharge: 2023-04-25 | Disposition: A | Discharge status: Home / Self Care | Payer: 59 | Attending: Emergency Medicine | Admitting: Emergency Medicine

## 2023-04-25 DIAGNOSIS — I959 Hypotension, unspecified: Secondary | ICD-10-CM | POA: Diagnosis not present

## 2023-04-25 DIAGNOSIS — F1290 Cannabis use, unspecified, uncomplicated: Secondary | ICD-10-CM | POA: Insufficient documentation

## 2023-04-25 DIAGNOSIS — Z79899 Other long term (current) drug therapy: Secondary | ICD-10-CM | POA: Diagnosis not present

## 2023-04-25 DIAGNOSIS — R55 Syncope and collapse: Secondary | ICD-10-CM | POA: Diagnosis present

## 2023-04-25 DIAGNOSIS — I509 Heart failure, unspecified: Secondary | ICD-10-CM | POA: Diagnosis not present

## 2023-04-25 DIAGNOSIS — Z7982 Long term (current) use of aspirin: Secondary | ICD-10-CM | POA: Diagnosis not present

## 2023-04-25 LAB — COMPREHENSIVE METABOLIC PANEL
ALT: 15 U/L (ref 0–44)
AST: 18 U/L (ref 15–41)
Albumin: 3.2 g/dL — ABNORMAL LOW (ref 3.5–5.0)
Alkaline Phosphatase: 97 U/L (ref 38–126)
Anion gap: 6 (ref 5–15)
BUN: 13 mg/dL (ref 6–20)
CO2: 28 mmol/L (ref 22–32)
Calcium: 8.6 mg/dL — ABNORMAL LOW (ref 8.9–10.3)
Chloride: 107 mmol/L (ref 98–111)
Creatinine, Ser: 1.16 mg/dL — ABNORMAL HIGH (ref 0.44–1.00)
GFR, Estimated: 54 mL/min — ABNORMAL LOW (ref >60)
Glucose, Bld: 82 mg/dL (ref 70–99)
Potassium: 3.9 mmol/L (ref 3.5–5.1)
Sodium: 141 mmol/L (ref 135–145)
Total Bilirubin: 0.5 mg/dL (ref <1.2)
Total Protein: 5.6 g/dL — ABNORMAL LOW (ref 6.5–8.1)

## 2023-04-25 LAB — TROPONIN I (HIGH SENSITIVITY)
Troponin I (High Sensitivity): 9 ng/L (ref <18)
Troponin I (High Sensitivity): 6 ng/L (ref <18)

## 2023-04-25 LAB — CBC WITH DIFFERENTIAL/PLATELET
Abs Immature Granulocytes: 0.02 10*3/uL (ref 0.00–0.07)
Basophils Absolute: 0.1 10*3/uL (ref 0.0–0.1)
Basophils Relative: 1 %
Eosinophils Absolute: 0.2 10*3/uL (ref 0.0–0.5)
Eosinophils Relative: 2 %
HCT: 40.7 % (ref 36.0–46.0)
Hemoglobin: 13.3 g/dL (ref 12.0–15.0)
Immature Granulocytes: 0 %
Lymphocytes Relative: 23 %
Lymphs Abs: 2 10*3/uL (ref 0.7–4.0)
MCH: 28.9 pg (ref 26.0–34.0)
MCHC: 32.7 g/dL (ref 30.0–36.0)
MCV: 88.5 fL (ref 80.0–100.0)
Monocytes Absolute: 0.8 10*3/uL (ref 0.1–1.0)
Monocytes Relative: 9 %
Neutro Abs: 5.6 10*3/uL (ref 1.7–7.7)
Neutrophils Relative %: 65 %
Platelets: 153 10*3/uL (ref 150–400)
RBC: 4.6 MIL/uL (ref 3.87–5.11)
RDW: 13.3 % (ref 11.5–15.5)
WBC: 8.6 10*3/uL (ref 4.0–10.5)
nRBC: 0 % (ref 0.0–0.2)

## 2023-04-25 LAB — BRAIN NATRIURETIC PEPTIDE: B Natriuretic Peptide: 36 pg/mL (ref 0.0–100.0)

## 2023-04-25 LAB — CBG MONITORING, ED: Glucose-Capillary: 111 mg/dL — ABNORMAL HIGH (ref 70–99)

## 2023-04-25 LAB — MAGNESIUM: Magnesium: 1.9 mg/dL (ref 1.7–2.4)

## 2023-04-25 MED ORDER — SODIUM CHLORIDE 0.9 % IV BOLUS
1000.0000 mL | Freq: Once | INTRAVENOUS | Status: AC
  Administered 2023-04-25: 1000 mL via INTRAVENOUS

## 2023-04-25 MED ORDER — IPRATROPIUM-ALBUTEROL 0.5-2.5 (3) MG/3ML IN SOLN
3.0000 mL | Freq: Once | RESPIRATORY_TRACT | Status: AC
  Administered 2023-04-25: 3 mL via RESPIRATORY_TRACT
  Filled 2023-04-25: qty 3

## 2023-04-25 MED ORDER — MAGNESIUM OXIDE -MG SUPPLEMENT 400 (240 MG) MG PO TABS
400.0000 mg | ORAL_TABLET | Freq: Once | ORAL | Status: AC
  Administered 2023-04-25: 400 mg via ORAL
  Filled 2023-04-25: qty 1

## 2023-04-25 NOTE — ED Provider Notes (Signed)
[place] EMERGENCY DEPARTMENT AT [facility] Provider Note   CSN: [id number] Arrival date & time: 04/25/23  1532     History  Chief Complaint  Patient presents with   Near Syncope    Darlene Wilcox is a 62 y.o. female.  With a past medical history of heart failure, seizures, long QT syndrome and polysubstance use who presents to the ED after syncopal episode.  Patient was enjoying her Thanksgiving afternoon and smoked marijuana with some of her friends prior to Thanksgiving meal.  Shortly thereafter she had a near syncopal episode in which her knees buckled.  No associated falls or trauma.  She has felt very shaky since then.  No alcohol or other drug use.  Denies chest pain, shortness of breath, focal weakness, abdominal pain, nausea, vomiting diaphoresis.   Near Syncope       Home Medications Prior to Admission medications   Medication Sig Start Date End Date Taking? Authorizing Provider  aspirin EC 81 MG tablet Take 1 tablet (81 mg total) by mouth daily. Swallow whole. 04/26/22   Antoine Poche, MD  atorvastatin (LIPITOR) 20 MG tablet take 1 tablet once daily. 04/02/23   Antoine Poche, MD  benztropine (COGENTIN) 1 MG tablet Take 1 mg by mouth at bedtime. 02/09/21   [provider]  busPIRone (BUSPAR) 15 MG tablet Take 15 mg by mouth 2 (two) times daily.    [provider]  cyclobenzaprine (FLEXERIL) 10 MG tablet Take 10 mg by mouth 2 (two) times daily as needed for muscle spasms. 02/09/21   [provider]  dapagliflozin propanediol (FARXIGA) 10 MG TABS tablet take 1 tablet once daily before breakfast. 04/02/23   Antoine Poche, MD  diclofenac Sodium (VOLTAREN) 1 % GEL Apply 1 Application topically daily as needed for pain. 11/18/21   [provider]  dimenhyDRINATE (DRAMAMINE) 50 MG tablet Take 50 mg by mouth every 8 (eight) hours as needed.    [provider]  diphenhydrAMINE (BENADRYL) 25 MG tablet Take 1  tablet (25 mg total) by mouth every 6 (six) hours as needed for itching. 12/21/18   Aviva Kluver B, PA-C  ferrous sulfate 324 MG TBEC Take 324 mg by mouth daily with breakfast.    [provider]  ibuprofen (ADVIL) 600 MG tablet Take 1 tablet (600 mg total) by mouth every 6 (six) hours as needed. Patient taking differently: Take 200 mg by mouth every 6 (six) hours as needed for headache, mild pain (pain score 1-3) or moderate pain (pain score 4-6). 10/09/19   Rosezella Rumpf, PA-C  INGREZZA 80 MG CAPS Take 80 mg by mouth at bedtime. 03/23/20   [provider]  isosorbide mononitrate (IMDUR) 30 MG 24 hr tablet Take 1 tablet (30 mg total) by mouth daily. 04/12/23   Sharlene Dory, NP  Lurasidone HCl 120 MG TABS Take 120 mg by mouth at bedtime. 01/11/20   [provider]  nitroGLYCERIN (NITROSTAT) 0.4 MG SL tablet PLACE 1 TAB UNDER TONGUE EVERY 3 MINUTES AS DIRECTED UP TO 3 TIMES AS NEEDED FOR CHEST PAIN. Patient taking differently: Place 0.4 mg under the tongue every 5 (five) minutes as needed for chest pain. PLACE 1 TAB UNDER TONGUE EVERY 3 MINUTES AS DIRECTED UP TO 3 TIMES AS NEEDED FOR CHEST PAIN. 02/08/20   Antoine Poche, MD  oxyCODONE-acetaminophen (PERCOCET) 10-325 MG tablet Take 1 tablet by mouth in the morning, at noon, in the evening, and at bedtime. 02/09/21  [provider]  prazosin (MINIPRESS) 2 MG capsule Take 2 mg by mouth at bedtime. 11/29/21   [provider]  PROAIR HFA 108 (90 Base) MCG/ACT inhaler INHALE 2 PUFFS INTO THE LUNGS EVERY 4 HOURS AS NEEDED FOR WHEEZING ORSHORTNESS OF BREATH. Patient taking differently: Inhale 1-2 puffs into the lungs every 6 (six) hours as needed for wheezing or shortness of breath. 09/19/16   Mechele Claude, MD  sacubitril-valsartan (ENTRESTO) 24-26 MG Take 1 tablet by mouth 2 (two) times daily. 04/23/22   Antoine Poche, MD  traZODone (DESYREL) 100 MG tablet Take 200 mg by mouth at bedtime. 04/02/20    [provider]      Allergies    Apple juice, Penicillins, and Bee venom    Review of Systems   Review of Systems  Cardiovascular:  Positive for near-syncope.    Physical Exam Updated Vital Signs BP (!) 118/57   Pulse 65   Temp 98.7 F (37.1 C)   Resp (!) 21   Ht 5\' 2"  (1.575 m)   Wt 76 kg   SpO2 99%   BMI 30.65 kg/m  Physical Exam Vitals and nursing note reviewed.  HENT:     Head: Normocephalic and atraumatic.  Eyes:     Pupils: Pupils are equal, round, and reactive to light.  Cardiovascular:     Rate and Rhythm: Normal rate and regular rhythm.  Pulmonary:     Effort: Pulmonary effort is normal.     Comments: End expiratory wheezing diffusely  Abdominal:     Palpations: Abdomen is soft.     Tenderness: There is no abdominal tenderness.  Skin:    General: Skin is warm and dry.  Neurological:     Mental Status: She is alert.  Psychiatric:        Mood and Affect: Mood normal.     ED Results / Procedures / Treatments   Labs (all labs ordered are listed, but only abnormal results are displayed) Labs Reviewed  COMPREHENSIVE METABOLIC PANEL - Abnormal; Notable for the following components:      Result Value   Creatinine, Ser 1.16 (*)    Calcium 8.6 (*)    Total Protein 5.6 (*)    Albumin 3.2 (*)    GFR, Estimated 54 (*)    All other components within normal limits  CBG MONITORING, ED - Abnormal; Notable for the following components:   Glucose-Capillary 111 (*)    All other components within normal limits  CBC WITH DIFFERENTIAL/PLATELET  MAGNESIUM  BRAIN NATRIURETIC PEPTIDE  TROPONIN I (HIGH SENSITIVITY)  TROPONIN I (HIGH SENSITIVITY)    EKG EKG Interpretation Date/Time:  Thursday April 25 2023 15:55:32 EST Ventricular Rate:  66 PR Interval:  198 QRS Duration:  102 QT Interval:  412 QTC Calculation: 432 R Axis:   -9  Text Interpretation: Sinus rhythm Left atrial enlargement Borderline T abnormalities, diffuse leads Confirmed by  Estelle June [phone]) on 04/25/2023 4:22:59 PM  Radiology DG Chest Portable 1 View  Result Date: 04/25/2023 CLINICAL DATA:  Near syncope. EXAM: PORTABLE CHEST 1 VIEW COMPARISON:  11/27/2018. FINDINGS: Cardiac silhouette is normal in size. No mediastinal or hilar masses. Clear lungs.  No pleural effusion or pneumothorax. Skeletal structures are grossly intact. Stable changes from a previous anterior cervical spine fusion. IMPRESSION: No active disease. Electronically Signed   By: Amie Portland M.D.   On: 04/25/2023 16:28    Procedures Procedures    Medications Ordered in ED Medications  sodium chloride  0.9 % bolus 1,000 mL (0 mLs Intravenous Stopped 04/25/23 1720)  ipratropium-albuterol (DUONEB) 0.5-2.5 (3) MG/3ML nebulizer solution 3 mL (3 mLs Nebulization Given 04/25/23 1657)  magnesium oxide (MAG-OX) tablet 400 mg (400 mg Oral Given 04/25/23 1723)  sodium chloride 0.9 % bolus 1,000 mL (1,000 mLs Intravenous New Bag/Given 04/25/23 1743)    ED Course/ Medical Decision Making/ A&P Clinical Course as of 04/25/23 1917  Thu Apr 25, 2023  1720 Chest x-ray shows no acute disease.  Creatinine slightly elevated at 1.16.  We already provided IV fluids.  I sensitive troponin not consistent with ACS.  No evidence of dysrhythmia or ischemic changes on EKG.  Magnesium is on the lower side of 1.9.  Will provide oral repletion.  Patient reports feeling much better overall after IV fluids.  Asymptomatic at this time.  Blood pressure has improved.  Will obtain orthostatic vital signs and make sure she is steady on her feet [MP]  1915 Patient blood pressure stable able to ambulate with steady gait after IV fluids observation to magnesium here.  Suspect near syncopal episode most likely due to transient hypotension directly following marijuana use.  Stable for discharge at this time [MP]    Clinical Course User Index [MP] Royanne Foots, DO                                 Medical Decision  Making 62 year old female with history as above presenting after nursing couple episode today.  Was enjoying her Thanksgiving with friends and family admits to smoking marijuana shortly before the episode.  Near syncopal episode with no associated head strike or trauma.  Feels shaky now.  Initial vital signs notable for hypotension with initial blood pressure of 88/47.  Exam notable for expiratory wheezing.  No evidence of tongue biting acute neurologic deficits or other pertinent findings.  Patient is mentating well overall.  Differential diagnosis includes Hypotension after marijuana use Vasovagal syncope Dehydration Dysrhythmia Atypical ACS an older female Pneumonia in patient with COPD Acute heart failure exacerbation  Will obtain chest x-ray, EKG, high sensitive troponin, BNP and laboratory workup  Will provide DuoNeb to help with wheezing and IV fluids for rehydration and hypotension  Amount and/or Complexity of Data Reviewed Labs: ordered. Radiology: ordered.  Risk OTC drugs. Prescription drug management.           Final Clinical Impression(s) / ED Diagnoses Final diagnoses:  Near syncope  Hypotension, unspecified hypotension type    Rx / DC Orders ED Discharge Orders     None         Royanne Foots, DO 04/25/23 1917

## 2023-04-25 NOTE — ED Triage Notes (Signed)
Pt had near syncopal episode where she would not respond to and had ? Seizure like episode that lasted around 2 minutes. Pt states her knees buckled during this episode.

## 2023-04-25 NOTE — Discharge Instructions (Signed)
You were seen in the emergency department after an episode of near fainting Your blood work EKG and chest x-ray all looked okay We gave you some fluids and magnesium here Your blood pressure improved Your symptoms may have been caused by marijuana use today You should refrain from using marijuana Follow-up with your primary care doctor within 1 week for reevaluation Return to the emergency department for repeated fainting episodes, chest pain, trouble breathing or any other concerns

## 2023-06-27 ENCOUNTER — Emergency Department (HOSPITAL_COMMUNITY)
Admission: EM | Admit: 2023-06-27 | Discharge: 2023-06-27 | Disposition: A | Discharge status: Home / Self Care | Payer: Medicare Other | Attending: Emergency Medicine | Admitting: Emergency Medicine

## 2023-06-27 ENCOUNTER — Other Ambulatory Visit: Payer: Self-pay

## 2023-06-27 ENCOUNTER — Encounter (HOSPITAL_COMMUNITY): Payer: Self-pay

## 2023-06-27 ENCOUNTER — Emergency Department (HOSPITAL_COMMUNITY): Payer: Medicare Other

## 2023-06-27 DIAGNOSIS — I110 Hypertensive heart disease with heart failure: Secondary | ICD-10-CM | POA: Diagnosis not present

## 2023-06-27 DIAGNOSIS — W010XXA Fall on same level from slipping, tripping and stumbling without subsequent striking against object, initial encounter: Secondary | ICD-10-CM | POA: Insufficient documentation

## 2023-06-27 DIAGNOSIS — I509 Heart failure, unspecified: Secondary | ICD-10-CM | POA: Diagnosis not present

## 2023-06-27 DIAGNOSIS — W19XXXA Unspecified fall, initial encounter: Secondary | ICD-10-CM

## 2023-06-27 DIAGNOSIS — G8929 Other chronic pain: Secondary | ICD-10-CM | POA: Insufficient documentation

## 2023-06-27 DIAGNOSIS — M5442 Lumbago with sciatica, left side: Secondary | ICD-10-CM | POA: Diagnosis not present

## 2023-06-27 DIAGNOSIS — M5441 Lumbago with sciatica, right side: Secondary | ICD-10-CM | POA: Diagnosis not present

## 2023-06-27 DIAGNOSIS — Z7982 Long term (current) use of aspirin: Secondary | ICD-10-CM | POA: Diagnosis not present

## 2023-06-27 DIAGNOSIS — J45909 Unspecified asthma, uncomplicated: Secondary | ICD-10-CM | POA: Diagnosis not present

## 2023-06-27 DIAGNOSIS — M25551 Pain in right hip: Secondary | ICD-10-CM | POA: Diagnosis present

## 2023-06-27 MED ORDER — OXYCODONE-ACETAMINOPHEN 5-325 MG PO TABS: 1.0000 | ORAL_TABLET | Freq: Three times a day (TID) | ORAL | 0 refills | Status: AC | PRN

## 2023-06-27 MED ORDER — HYDROMORPHONE HCL 1 MG/ML IJ SOLN
1.0000 mg | Freq: Once | INTRAMUSCULAR | Status: AC
  Administered 2023-06-27: 1 mg via INTRAVENOUS
  Filled 2023-06-27: qty 1

## 2023-06-27 MED ORDER — KETOROLAC TROMETHAMINE 15 MG/ML IJ SOLN
15.0000 mg | Freq: Once | INTRAMUSCULAR | Status: AC
  Administered 2023-06-27: 15 mg via INTRAVENOUS
  Filled 2023-06-27: qty 1

## 2023-06-27 MED ORDER — FENTANYL CITRATE PF 50 MCG/ML IJ SOSY
50.0000 ug | PREFILLED_SYRINGE | Freq: Once | INTRAMUSCULAR | Status: AC
  Administered 2023-06-27: 50 ug via INTRAVENOUS
  Filled 2023-06-27: qty 1

## 2023-06-27 NOTE — Discharge Instructions (Addendum)
It was a pleasure caring for you today. Imaging without acute concern. Please follow up with your primary care provider. Seek emergency care if experiencing any new or worsening symptoms.  Alternating between 650 mg Tylenol and 400 mg Advil: The best way to alternate taking Acetaminophen (example Tylenol) and Ibuprofen (example Advil/Motrin) is to take them 3 hours apart. For example, if you take ibuprofen at 6 am you can then take Tylenol at 9 am. You can continue this regimen throughout the day, making sure you do not exceed the recommended maximum dose for each drug.

## 2023-06-27 NOTE — ED Notes (Signed)
See triage notes. Pt states she has been taking her oxy for pain at home. Has not had any today. Pt c/o pain to lower back and right hip. No obvious deformities noted. No rotation noted to legs. Pt able to bend and move both legs without any increased pain. Bilateral  pedal pulses strong. A/o. Color wnl. States does not hurt to head from fall.

## 2023-06-27 NOTE — ED Triage Notes (Signed)
Pt. BIB [facility]. Pt. Larey Seat @ home on Monday, tripped on a rug. Pt. Does note hitting the back of her head. Pt. Is still in severe pain in her right hip and back.

## 2023-06-27 NOTE — ED Provider Notes (Signed)
[place] EMERGENCY DEPARTMENT AT [facility] Provider Note   CSN: [id number] Arrival date & time: 06/27/23  1003     History  Chief Complaint  Patient presents with   Marletta Lor    Darlene Wilcox is a 63 y.o. female with PMHx anxiety, asthma, bipolar disorder, CHF, chronic back pain, GERD, headaches, HTN, seizures who presents to the ED concerned for back and right hip pain since a fall 4 days ago. Patient stating that she tripped and fell on a rug. Patient has been taking Oxy at home, but pain is getting worse over the span of 4 days. Patient stating that she hit the back of her head on Monday during the fall, but denies LOC, seizures, blood thinners. Patient also endorsing subjective paresthesias since the fall.  denies urinary retention, fecal incontinence, saddle anesthesia, lower extremity weakness, hx of cancer, fever, immunosuppression, IVDU, recent spinal procedure   Fall       Home Medications Prior to Admission medications   Medication Sig Start Date End Date Taking? Authorizing Provider  oxyCODONE-acetaminophen (PERCOCET/ROXICET) 5-325 MG tablet Take 1 tablet by mouth every 8 (eight) hours as needed for up to 5 doses for severe pain (pain score 7-10). 06/27/23  Yes Valrie Hart F, PA-C  aspirin EC 81 MG tablet Take 1 tablet (81 mg total) by mouth daily. Swallow whole. 04/26/22   Antoine Poche, MD  atorvastatin (LIPITOR) 20 MG tablet take 1 tablet once daily. 04/02/23   Antoine Poche, MD  benztropine (COGENTIN) 1 MG tablet Take 1 mg by mouth at bedtime. 02/09/21   [provider]  busPIRone (BUSPAR) 15 MG tablet Take 15 mg by mouth 2 (two) times daily.    [provider]  cyclobenzaprine (FLEXERIL) 10 MG tablet Take 10 mg by mouth 2 (two) times daily as needed for muscle spasms. 02/09/21   [provider]  dapagliflozin propanediol (FARXIGA) 10 MG TABS tablet take 1 tablet once daily before breakfast. 04/02/23   Antoine Poche, MD  diclofenac Sodium (VOLTAREN) 1 % GEL Apply 1 Application topically daily as needed for pain. 11/18/21   [provider]  dimenhyDRINATE (DRAMAMINE) 50 MG tablet Take 50 mg by mouth every 8 (eight) hours as needed.    [provider]  diphenhydrAMINE (BENADRYL) 25 MG tablet Take 1 tablet (25 mg total) by mouth every 6 (six) hours as needed for itching. 12/21/18   Aviva Kluver B, PA-C  ferrous sulfate 324 MG TBEC Take 324 mg by mouth daily with breakfast.    [provider]  ibuprofen (ADVIL) 600 MG tablet Take 1 tablet (600 mg total) by mouth every 6 (six) hours as needed. Patient taking differently: Take 200 mg by mouth every 6 (six) hours as needed for headache, mild pain (pain score 1-3) or moderate pain (pain score 4-6). 10/09/19   Rosezella Rumpf, PA-C  INGREZZA 80 MG CAPS Take 80 mg by mouth at bedtime. 03/23/20   [provider]  isosorbide mononitrate (IMDUR) 30 MG 24 hr tablet Take 1 tablet (30 mg total) by mouth daily. 04/12/23   Sharlene Dory, NP  Lurasidone HCl 120 MG TABS Take 120 mg by mouth at bedtime. 01/11/20   [provider]  nitroGLYCERIN (NITROSTAT) 0.4 MG SL tablet PLACE 1 TAB UNDER TONGUE EVERY 3 MINUTES AS DIRECTED UP TO 3 TIMES AS NEEDED FOR CHEST PAIN. Patient taking differently: Place 0.4 mg under the tongue every 5 (five) minutes as needed for chest  pain. PLACE 1 TAB UNDER TONGUE EVERY 3 MINUTES AS DIRECTED UP TO 3 TIMES AS NEEDED FOR CHEST PAIN. 02/08/20   Antoine Poche, MD  oxyCODONE-acetaminophen (PERCOCET) 10-325 MG tablet Take 1 tablet by mouth in the morning, at noon, in the evening, and at bedtime. 02/09/21   [provider]  prazosin (MINIPRESS) 2 MG capsule Take 2 mg by mouth at bedtime. 11/29/21   [provider]  PROAIR HFA 108 (90 Base) MCG/ACT inhaler INHALE 2 PUFFS INTO THE LUNGS EVERY 4 HOURS AS NEEDED FOR WHEEZING ORSHORTNESS OF BREATH. Patient taking differently: Inhale 1-2  puffs into the lungs every 6 (six) hours as needed for wheezing or shortness of breath. 09/19/16   Mechele Claude, MD  sacubitril-valsartan (ENTRESTO) 24-26 MG Take 1 tablet by mouth 2 (two) times daily. 04/23/22   Antoine Poche, MD  traZODone (DESYREL) 100 MG tablet Take 200 mg by mouth at bedtime. 04/02/20   [provider]      Allergies    Apple juice, Penicillins, and Bee venom    Review of Systems   Review of Systems  Musculoskeletal:        Hip pain    Physical Exam Updated Vital Signs BP (!) 156/63   Pulse 82   Temp 98.4 F (36.9 C) (Oral)   Resp 18   Ht 5\' 2"  (1.575 m)   Wt 73.9 kg   SpO2 94%   BMI 29.81 kg/m  Physical Exam Vitals and nursing note reviewed.  Constitutional:      General: She is not in acute distress.    Appearance: She is not ill-appearing or toxic-appearing.  HENT:     Head: Normocephalic and atraumatic.     Mouth/Throat:     Mouth: Mucous membranes are moist.  Eyes:     General: No scleral icterus.       Right eye: No discharge.        Left eye: No discharge.     Conjunctiva/sclera: Conjunctivae normal.  Cardiovascular:     Rate and Rhythm: Normal rate and regular rhythm.     Pulses: Normal pulses.     Heart sounds: Normal heart sounds. No murmur heard. Pulmonary:     Effort: Pulmonary effort is normal.  Musculoskeletal:     Right lower leg: No edema.     Left lower leg: No edema.  Skin:    General: Skin is warm and dry.     Findings: No rash.  Neurological:     General: No focal deficit present.     Mental Status: She is alert. Mental status is at baseline.     Comments: Right hip ROM intact. +2 pedal pulses BL. Sensation to light touch intact.  Psychiatric:        Mood and Affect: Mood normal.     ED Results / Procedures / Treatments   Labs (all labs ordered are listed, but only abnormal results are displayed) Labs Reviewed - No data to display  EKG None  Radiology CT Lumbar Spine Wo Contrast Result Date:  06/27/2023 CLINICAL DATA:  Lumbar radiculopathy and trauma EXAM: CT LUMBAR SPINE WITHOUT CONTRAST TECHNIQUE: Multidetector CT imaging of the lumbar spine was performed without intravenous contrast administration. Multiplanar CT image reconstructions were also generated. RADIATION DOSE REDUCTION: This exam was performed according to the departmental dose-optimization program which includes automated exposure control, adjustment of the mA and/or kV according to patient size and/or use of iterative reconstruction technique. COMPARISON:  09/18/2022 FINDINGS:  Segmentation: The lowest lumbar type non-rib-bearing vertebra is labeled as L5. Alignment: 4 mm degenerative retrolisthesis at L1-2 and L2-3, unchanged. Vertebrae: Posterolateral rod and pedicle screw fixation at all levels between T11 and L5 bilaterally with interbody spacers at L2-3 and L3-4. Posterior decompressions at L2-3, L3-4, and L4-5. Solid interbody fusion observed at L4-5 and there is also bony interbody bridging at L2-3. No new fracture observed. Degenerative endplate sclerosis eccentric to the right at L5-S1. Vacuum disc phenomenon at L5-S1. Paraspinal and other soft tissues: Atherosclerosis is present, including aortoiliac atherosclerotic disease. Nonobstructive nephrolithiasis in the left kidney lower pole. Disc levels: L1-2: Moderate to severe left foraminal stenosis due to intervertebral and facet spurring. Worsened from prior. L2-3: Mild right foraminal stenosis due to facet spurring. L3-4: No impingement. L4-5: No impingement. L5-S1: Likely prominent right and moderate to prominent left foraminal stenosis due to bilateral facet spurring, right eccentric intervertebral spurring, and suspected right foraminal disc protrusion. Findings at this level are similar to 09/18/22. IMPRESSION: 1. No acute lumbar spine findings. 2. Lumbar spondylosis and degenerative disc disease, causing prominent impingement at L5-S1 moderate to prominent impingement at  L1-2. The L1-2 impingement is worsened compared to the prior lumbar myelogram. 3. Posterolateral rod and pedicle screw fixation at all levels between T11 and L5 bilaterally with interbody spacers at L2-3 and L3-4. Posterior decompressions at L2-3, L3-4, and L4-5. Solid interbody fusion at L4-5 and there is also bony interbody bridging at L2-3. 4. Nonobstructive nephrolithiasis in the left kidney lower pole. 5. Aortic Atherosclerosis (ICD10-I70.0). Electronically Signed   By: Gaylyn Rong M.D.   On: 06/27/2023 12:00   CT Hip Right Wo Contrast Result Date: 06/27/2023 CLINICAL DATA:  Hip trauma EXAM: CT OF THE RIGHT HIP WITHOUT CONTRAST TECHNIQUE: Multidetector CT imaging of the right hip was performed according to the standard protocol. Multiplanar CT image reconstructions were also generated. RADIATION DOSE REDUCTION: This exam was performed according to the departmental dose-optimization program which includes automated exposure control, adjustment of the mA and/or kV according to patient size and/or use of iterative reconstruction technique. COMPARISON:  CT pelvis 02/25/2020 FINDINGS: Despite efforts by the technologist and patient, motion artifact is present on today's exam and could not be eliminated. This reduces exam sensitivity and specificity. Bones/Joint/Cartilage No fracture or hip joint effusion identified. Right femoral head and acetabular spurring with subcortical cyst formation along the acetabulum, as well as chronic well corticated fragmentation of some right anterior superior acetabular spurring. There is also some spurring along the ischial tuberosity and greater trochanter. Ligaments Suboptimally assessed by CT. Muscles and Tendons Chronic right gluteus minimus atrophy. Small chronic well corticated ossicle in the right proximal hamstring tendon. Soft tissues Iliac artery atherosclerosis. Uterus absent. Lumbar spondylosis and degenerative disc disease at L5-S1 which will be characterized  on the dedicated lumbar spine CT report. No findings of impingement along the right sacral plexus or sciatic notch. IMPRESSION: 1. No acute fracture or hip joint effusion identified. 2. Right hip osteoarthritis. 3. Chronic right gluteus minimus atrophy. 4. Lumbar spondylosis and degenerative disc disease at L5-S1 which will be characterized on the dedicated lumbar spine CT report. 5. Iliac artery atherosclerosis. Electronically Signed   By: Gaylyn Rong M.D.   On: 06/27/2023 11:51    Procedures Procedures    Medications Ordered in ED Medications  ketorolac (TORADOL) 15 MG/ML injection 15 mg (has no administration in time range)  fentaNYL (SUBLIMAZE) injection 50 mcg (50 mcg Intravenous Given 06/27/23 1036)  HYDROmorphone (DILAUDID) injection 1 mg (  1 mg Intravenous Given 06/27/23 1212)    ED Course/ Medical Decision Making/ A&P                                 Medical Decision Making Amount and/or Complexity of Data Reviewed Radiology: ordered.  Risk Prescription drug management.   This patient presents to the ED for concern of back and hip pain, this involves an extensive number of treatment options, and is a complaint that carries with it a high risk of complications and morbidity.  The differential diagnosis includes spinal abscess, osteomyelitis, herniated disc, muscle strain, fracture, cancer, cauda equina syndrome, hemarthrosis, septic joint, gout.   Co morbidities that complicate the patient evaluation  anxiety, asthma, bipolar disorder, CHF, chronic back pain, GERD, headaches, HTN, seizures    Additional history obtained:  PCP with [facility]    Problem List / ED Course / Critical interventions / Medication management  Patient concerned for right hip pain and lower back pain that is increased in severity after a fall 4 days ago. Hx of chronic back pain. No infectious symptoms today.  Patient afebrile with stable vitals.  Physical exam reassuring.   Patient stating that her legs have subjective numbness.  Physical exam not concerning for this as patient is able to feel sensation to light touch in all leg dermatomes of upper and lower legs. Provided patient with pain management in ED which relieved pain. I ordered imaging studies including CT lumbar/hip to assess for process contributing to patient's symptoms. I independently visualized and interpreted imaging which showed no acute process. I agree with the radiologist interpretation Patient denies urinary retention, fecal incontinence, saddle anesthesia, lower extremity weakness, hx of cancer, fever, immunosuppression, IVDU, spinal procedure, significant trauma. Will proceed with conservative therapy and have patient follow up with PCP. Patient with a history of back surgeries and stating that she would be able to follow-up with her provider in the near future for her chronic back pain. Daughter asking me to provide patient with a couple doses of percocet for breakthrough pain which I have done.  Also educated patient alternating ibuprofen and Tylenol for pain control.  Patient verbalized understanding of plan. I have reviewed the patients home medicines and have made adjustments as needed Patient afebrile with stable vitals.  Provided with return precautions.  Discharged in good condition.  Ddx: these are considered less likely due to history of present illness and physical exam -spinal abscess: no fever, no history of IVDU -osteomyelitis: no history of IVDU; pain is acute -spinal fracture: not a high force trauma associated with pain -cancer: symptoms are acute -cauda equina syndrome: denies saddle paresthesia, urinary retention, fecal incontinence -gout: ROM intact  -septic joint: afebrile; ROM intact  -fracture: CT without concern  -compartment syndrome: area not tense; neurovascularly intact   Social Determinants of Health:  none         Final Clinical Impression(s) / ED  Diagnoses Final diagnoses:  Fall, initial encounter  Pain of right hip  Chronic bilateral low back pain with bilateral sciatica    Rx / DC Orders ED Discharge Orders          Ordered    oxyCODONE-acetaminophen (PERCOCET/ROXICET) 5-325 MG tablet  Every 8 hours PRN        06/27/23 1251              Dorthy Cooler, [place] 06/27/23 1258    Charm Barges,  Kayleen Memos, MD 06/27/23 [phone]

## 2023-08-09 ENCOUNTER — Telehealth: Payer: Self-pay | Admitting: Cardiology

## 2023-08-09 MED ORDER — ENTRESTO 24-26 MG PO TABS: 1.0000 | ORAL_TABLET | Freq: Two times a day (BID) | ORAL | 2 refills | Status: DC

## 2023-08-09 MED ORDER — DAPAGLIFLOZIN PROPANEDIOL 10 MG PO TABS: 10.0000 mg | ORAL_TABLET | Freq: Every day | ORAL | 2 refills | Status: DC

## 2023-08-09 MED ORDER — ATORVASTATIN CALCIUM 20 MG PO TABS: 20.0000 mg | ORAL_TABLET | Freq: Every day | ORAL | 2 refills | Status: DC

## 2023-08-09 MED ORDER — ISOSORBIDE MONONITRATE ER 30 MG PO TB24: 30.0000 mg | ORAL_TABLET | Freq: Every day | ORAL | 2 refills | Status: DC

## 2023-08-09 NOTE — Telephone Encounter (Signed)
*  STAT* If patient is at the pharmacy, call can be transferred to refill team.   1. Which medications need to be refilled? (please list name of each medication and dose if known)   atorvastatin (LIPITOR) 20 MG tablet  dapagliflozin propanediol (FARXIGA) 10 MG TABS tablet  isosorbide mononitrate (IMDUR) 30 MG 24 hr tablet  sacubitril-valsartan (ENTRESTO) 24-26 MG   2. Would you like to learn more about the convenience, safety, & potential cost savings by using the [facility]?   3. Are you open to using the [facility] (Type [facility]. ).  4. Which pharmacy/location (including street and city if local pharmacy) is medication to be sent to?  [id number] - [place], [place] - [address]   5. Do they need a 30 day or 90 day supply?   90 day  Patient stated she is completely out of these medications.

## 2023-08-09 NOTE — Telephone Encounter (Signed)
 Pt's medications were sent to pt's pharmacy as requested. Confirmation received.

## 2023-09-09 ENCOUNTER — Other Ambulatory Visit: Payer: Self-pay | Admitting: *Deleted

## 2023-09-09 MED ORDER — ISOSORBIDE MONONITRATE ER 30 MG PO TB24: 30.0000 mg | ORAL_TABLET | Freq: Every day | ORAL | 0 refills | Status: DC

## 2023-09-23 ENCOUNTER — Telehealth: Payer: Self-pay | Admitting: Cardiology

## 2023-09-23 NOTE — Telephone Encounter (Signed)
 Patient states she seen the doctor today at [facility] and they check BP and HR after visit  HR 123  BP- 128/74 They advised her to reach out to us  for an appointment  Any symptoms? No she states she feels fine No CP,Dizziness, Palpitations or weakness   Previous HR readings in our office have been within a normal range   Patient current at home BP-  150/79     HR-100

## 2023-09-23 NOTE — Telephone Encounter (Signed)
 STAT if HR is under 50 or over 120 (normal HR is 60-100 beats per minute)  What is your heart rate? 123 (at PCP office)  Do you have a log of your heart rate readings (document readings)? No   Do you have any other symptoms? No

## 2023-09-23 NOTE — Telephone Encounter (Signed)
Nursing visit for EKG and vitals please   Zandra Abts MD

## 2023-09-23 NOTE — Telephone Encounter (Signed)
Scheduled for 5/7.

## 2023-10-02 ENCOUNTER — Telehealth: Payer: Self-pay | Admitting: Cardiology

## 2023-10-02 ENCOUNTER — Ambulatory Visit

## 2023-10-02 NOTE — Telephone Encounter (Signed)
 Pt was scheduled for nurse visit this morning and missed the bus so she can't make it and would like a callback to r/s. Please advise

## 2023-10-02 NOTE — Telephone Encounter (Signed)
 Patient has been rescheduled by front office

## 2023-10-09 ENCOUNTER — Other Ambulatory Visit: Payer: Self-pay

## 2023-10-09 ENCOUNTER — Ambulatory Visit

## 2023-10-09 ENCOUNTER — Ambulatory Visit: Attending: Cardiology

## 2023-10-09 VITALS — BP 132/78 | HR 77 | Ht 62.0 in | Wt 171.0 lb

## 2023-10-09 DIAGNOSIS — I2510 Atherosclerotic heart disease of native coronary artery without angina pectoris: Secondary | ICD-10-CM

## 2023-10-09 DIAGNOSIS — R000 Tachycardia, unspecified: Secondary | ICD-10-CM

## 2023-10-09 MED ORDER — NITROGLYCERIN 0.4 MG SL SUBL: SUBLINGUAL_TABLET | SUBLINGUAL | 3 refills | Status: DC

## 2023-10-09 NOTE — Progress Notes (Signed)
 Nurse visit, patient of doctoe Branch.  Told by psychiatrist that she sees monthly that her BP is elevated  She said she was recently diagnosed with panick attacks and will see someone later this week for medication  She reports feeling hot all over, anxious, heart races,dizziness, her chest hurts.Says episodes last 30 minutes   She ran out of NTG 1 month ago-refilled for her   She currently has no symptoms   I will forward to Dr.Branch  Dr.Branch requested zio for 2 weeks - applied in office

## 2023-11-15 ENCOUNTER — Telehealth: Payer: Self-pay | Admitting: Cardiology

## 2023-11-15 NOTE — Telephone Encounter (Signed)
*  STAT* If patient is at the pharmacy, call can be transferred to refill team.   1. Which medications need to be refilled? (please list name of each medication and dose if known) atorvastatin  (LIPITOR) 20 MG tablet  dapagliflozin  propanediol (FARXIGA ) 10 MG TABS tablet  sacubitril-valsartan (ENTRESTO ) 24-26 MG  aspirin  EC 81 MG tablet  isosorbide  mononitrate (IMDUR ) 30 MG 24 hr tablet  nitroGLYCERIN  (NITROSTAT ) 0.4 MG SL tablet  2. Which pharmacy/location (including street and city if local pharmacy) is medication to be sent to?  [facility] - [place], [place] - [place] - [address] [place]   3. Do they need a 30 day or 90 day supply? 90

## 2023-11-18 MED ORDER — DAPAGLIFLOZIN PROPANEDIOL 10 MG PO TABS: 10.0000 mg | ORAL_TABLET | Freq: Every day | ORAL | 1 refills | Status: AC

## 2023-11-18 MED ORDER — ENTRESTO 24-26 MG PO TABS: 1.0000 | ORAL_TABLET | Freq: Two times a day (BID) | ORAL | 1 refills | Status: DC

## 2023-11-18 MED ORDER — ATORVASTATIN CALCIUM 20 MG PO TABS: 20.0000 mg | ORAL_TABLET | Freq: Every day | ORAL | 1 refills | Status: AC

## 2023-11-18 MED ORDER — ISOSORBIDE MONONITRATE ER 30 MG PO TB24: 30.0000 mg | ORAL_TABLET | Freq: Every day | ORAL | 1 refills | Status: AC

## 2023-11-18 MED ORDER — ASPIRIN 81 MG PO TBEC: 81.0000 mg | DELAYED_RELEASE_TABLET | Freq: Every day | ORAL | 1 refills | Status: DC

## 2023-11-18 MED ORDER — NITROGLYCERIN 0.4 MG SL SUBL: SUBLINGUAL_TABLET | SUBLINGUAL | 1 refills | Status: AC

## 2023-11-18 NOTE — Telephone Encounter (Signed)
 RX sent to requested Pharmacy

## 2023-12-16 DIAGNOSIS — R000 Tachycardia, unspecified: Secondary | ICD-10-CM

## 2023-12-17 ENCOUNTER — Other Ambulatory Visit (HOSPITAL_COMMUNITY): Payer: Self-pay | Admitting: Family Medicine

## 2023-12-17 DIAGNOSIS — Z1231 Encounter for screening mammogram for malignant neoplasm of breast: Secondary | ICD-10-CM

## 2023-12-18 ENCOUNTER — Ambulatory Visit: Payer: Self-pay | Admitting: Cardiology

## 2023-12-19 ENCOUNTER — Telehealth: Payer: Self-pay | Admitting: Cardiology

## 2023-12-19 ENCOUNTER — Encounter: Payer: Self-pay | Admitting: *Deleted

## 2023-12-19 ENCOUNTER — Telehealth: Payer: Self-pay | Admitting: *Deleted

## 2023-12-19 NOTE — Telephone Encounter (Signed)
   Name: Darlene Wilcox  DOB: [birth year]  MRN: [id number]  Primary Cardiologist: Alvan Carrier, MD   Preoperative team, please contact this patient and set up a phone call appointment for further preoperative risk assessment. Please obtain consent and complete medication review. Thank you for your help.  I confirm that guidance regarding antiplatelet and oral anticoagulation therapy has been completed and, if necessary, noted below.  Per office protocol, if patient is without any new symptoms or concerns at the time of their virtual visit, she may hold aspirin  for 7 days prior to procedure. Please resume aspirin  as soon as possible postprocedure, at the discretion of the surgeon.    I also confirmed the patient resides in the state of [place] . As per [facility] Medical Board telemedicine laws, the patient must reside in the state in which the provider is licensed.   Lum LITTIE Louis, NP 12/19/2023, 12:13 PM [facility]

## 2023-12-19 NOTE — Telephone Encounter (Signed)
 Pt has been scheduled tele preop appt 01/17/24. Med rec and consent are done.      Patient Consent for Virtual Visit        JAICEE MICHELOTTI has provided verbal consent on 12/19/2023 for a virtual visit (video or telephone).   CONSENT FOR VIRTUAL VISIT FOR:  Jon GORMAN Garland  By participating in this virtual visit I agree to the following:  I hereby voluntarily request, consent and authorize [facility] and its employed or contracted physicians, physician assistants, nurse practitioners or other licensed health care professionals (the Practitioner), to provide me with telemedicine health care services (the "Services) as deemed necessary by the treating Practitioner. I acknowledge and consent to receive the Services by the Practitioner via telemedicine. I understand that the telemedicine visit will involve communicating with the Practitioner through live audiovisual communication technology and the disclosure of certain medical information by electronic transmission. I acknowledge that I have been given the opportunity to request an in-person assessment or other available alternative prior to the telemedicine visit and am voluntarily participating in the telemedicine visit.  I understand that I have the right to withhold or withdraw my consent to the use of telemedicine in the course of my care at any time, without affecting my right to future care or treatment, and that the Practitioner or I may terminate the telemedicine visit at any time. I understand that I have the right to inspect all information obtained and/or recorded in the course of the telemedicine visit and may receive copies of available information for a reasonable fee.  I understand that some of the potential risks of receiving the Services via telemedicine include:  Delay or interruption in medical evaluation due to technological equipment failure or disruption; Information transmitted may not be sufficient (e.g. poor  resolution of images) to allow for appropriate medical decision making by the Practitioner; and/or  In rare instances, security protocols could fail, causing a breach of personal health information.  Furthermore, I acknowledge that it is my responsibility to provide information about my medical history, conditions and care that is complete and accurate to the best of my ability. I acknowledge that Practitioner's advice, recommendations, and/or decision may be based on factors not within their control, such as incomplete or inaccurate data provided by me or distortions of diagnostic images or specimens that may result from electronic transmissions. I understand that the practice of medicine is not an exact science and that Practitioner makes no warranties or guarantees regarding treatment outcomes. I acknowledge that a copy of this consent can be made available to me via my patient portal [facility] MyChart), or I can request a printed copy by calling the office of [facility].    I understand that my insurance will be billed for this visit.   I have read or had this consent read to me. I understand the contents of this consent, which adequately explains the benefits and risks of the Services being provided via telemedicine.  I have been provided ample opportunity to ask questions regarding this consent and the Services and have had my questions answered to my satisfaction. I give my informed consent for the services to be provided through the use of telemedicine in my medical care

## 2023-12-19 NOTE — Telephone Encounter (Signed)
   Pre-operative Risk Assessment    Patient Name: Darlene Wilcox  DOB: April 27, 1961 MRN: [id number]      Request for Surgical Clearance    Procedure:  TLIF L5-S1  Date of Surgery:  Clearance TBD                                 Surgeon:  Royden Schneider, MD,FAAOS Surgeon's Group or Practice Name:  [facility]  Phone number:  [phone] Fax number:  [phone]   Type of Clearance Requested:   - Pharmacy:  Hold Aspirin  for 7 days before   Type of Anesthesia:  General    Additional requests/questions:  Please fax a copy of clearance to the surgeon's office.  [name] [name]   12/19/2023, 8:44 AM

## 2023-12-19 NOTE — Telephone Encounter (Signed)
 Pt has been scheduled tele preop appt 01/17/24. Med rec and consent are done.

## 2023-12-25 ENCOUNTER — Ambulatory Visit (HOSPITAL_COMMUNITY)
Admission: RE | Admit: 2023-12-25 | Discharge: 2023-12-25 | Disposition: A | Discharge status: Home / Self Care | Source: Ambulatory Visit | Attending: Family Medicine | Admitting: Family Medicine

## 2023-12-25 DIAGNOSIS — Z1231 Encounter for screening mammogram for malignant neoplasm of breast: Secondary | ICD-10-CM | POA: Insufficient documentation

## 2024-01-17 ENCOUNTER — Ambulatory Visit: Attending: Internal Medicine | Admitting: Nurse Practitioner

## 2024-01-17 ENCOUNTER — Encounter: Payer: Self-pay | Admitting: Nurse Practitioner

## 2024-01-17 DIAGNOSIS — Z01810 Encounter for preprocedural cardiovascular examination: Secondary | ICD-10-CM | POA: Diagnosis not present

## 2024-01-17 NOTE — Progress Notes (Signed)
 Virtual Visit via Telephone Note   Because of Darlene Wilcox co-morbid illnesses, she is at least at moderate risk for complications without adequate follow up.  This format is felt to be most appropriate for this patient at this time.  Due to technical limitations with video connection Web designer), today's appointment will be conducted as an audio only telehealth visit, and Darlene Wilcox verbally agreed to proceed in this manner.   All issues noted in this document were discussed and addressed.  No physical exam could be performed with this format.  Evaluation Performed:  Preoperative cardiovascular risk assessment _____________   Date:  01/17/2024   [name], DOB 29-May-1960, MRN [id number] Patient Location:  Home Provider location:   Office  Primary Care Provider:  [facility] Cardiologist:  Alvan Carrier, MD  Chief Complaint / Patient Profile   63 y.o. y/o female with a h/o NICM/chronic HFrEF with improved EF on echo 05/10/2022, CAD to be managed medically, HLD, HTN, depression, seizures, bipolar disorder who is pending TLIF L5-S1 with Dr. Gust on date TBD and presents today for telephonic preoperative cardiovascular risk assessment.  History of Present Illness    Darlene Wilcox is a 63 y.o. female who presents via audio/video conferencing for a telehealth visit today.  Pt was last seen in cardiology clinic on 04/04/23 by Almarie Crate, NP.  At that time Darlene Wilcox was doing well.  The patient is now pending procedure as outlined above. Since her last visit, she  denies chest pain, shortness of breath, lower extremity edema, fatigue, palpitations, melena, hematuria, hemoptysis, diaphoresis, weakness, presyncope, syncope, orthopnea, and PND. She reports some limitations with longing long distance and other moderately exertional activities but is able to complete > 4 METS without concerning cardiac symptoms.  She has musculoskeletal issues that generally limit activity more than occasional shortness of breath.  Past Medical History    Past Medical History:  Diagnosis Date   Anxiety    Asthma    daily inhaler   Bipolar disorder (HCC)    Bronchitis due to tobacco use 07/2016   Carpal tunnel syndrome of right wrist 12/2012   CHF (congestive heart failure) (HCC)    Chronic back pain greater than 3 months duration    Depression    Full dentures    GERD (gastroesophageal reflux disease)    Headache(784.0)    1-2 x/month   History of ankle sprain    right; 2 weeks ago;  c/o severe pain   Hypertension    Seizures (HCC)    states silent seizures; is on anticonvulsant; none in 2-3 mos.   Past Surgical History:  Procedure Laterality Date   ABDOMINAL HYSTERECTOMY     partial   APPENDECTOMY     BUNIONECTOMY Left    CARPAL TUNNEL RELEASE Right 01/13/2013   Procedure: CARPAL TUNNEL RELEASE;  Surgeon: Lamar LULLA Leonor Mickey., MD;  Location: [facility];  Service: Orthopedics;  Laterality: Right;   CERVICAL FUSION     x 2   DIAGNOSTIC LAPAROSCOPY  11/08/2006   INGUINAL HERNIA REPAIR Right    LAPAROSCOPIC APPENDECTOMY  11/08/2006   LEFT HEART CATH AND CORONARY ANGIOGRAPHY N/A 08/08/2016   Procedure: Left Heart Cath and Coronary Angiography;  Surgeon: Deatrice DELENA Cage, MD;  Location: [facility];  Service: Cardiovascular;  Laterality: N/A;   LUMBAR FUSION  06/28/2014   L2-5   LUMBAR LAMINECTOMY  02/05/2007  L4-5 fusion   RIGHT/LEFT HEART CATH AND CORONARY ANGIOGRAPHY N/A 01/04/2022   Procedure: RIGHT/LEFT HEART CATH AND CORONARY ANGIOGRAPHY;  Surgeon: Claudene Victory ORN, MD;  Location: [facility];  Service: Cardiovascular;  Laterality: N/A;   TUBAL LIGATION      Allergies  Allergies  Allergen Reactions   Apple Juice Anaphylaxis and Shortness Of Breath   Penicillins Anaphylaxis, Shortness Of Breath, Itching and Swelling    Has patient had a PCN reaction causing immediate  rash, facial/tongue/throat swelling, SOB or lightheadedness with hypotension: Unknown, childhood reaction Has patient had a PCN reaction causing severe rash involving mucus membranes or skin necrosis: No Has patient had a PCN reaction that required hospitalization No Has patient had a PCN reaction occurring within the last 10 years: No If all of the above answers are NO, then may proceed with Cephalosporin use.    Bee Venom Swelling    Takes benadryl  to help with reaction    Home Medications    Prior to Admission medications   Medication Sig Start Date End Date Taking? Authorizing Provider  aspirin  EC 81 MG tablet Take 1 tablet (81 mg total) by mouth daily. Swallow whole. 11/18/23   Alvan Dorn FALCON, MD  atorvastatin  (LIPITOR) 20 MG tablet Take 1 tablet (20 mg total) by mouth daily. Patient not taking: Reported on 12/19/2023 11/18/23   Alvan Dorn FALCON, MD  benztropine (COGENTIN) 1 MG tablet Take 1 mg by mouth at bedtime. Patient not taking: Reported on 12/19/2023 02/09/21   [provider]  bisoprolol  (ZEBETA ) 5 MG tablet Take 5 mg by mouth daily. 12/16/23   [provider]  busPIRone  (BUSPAR ) 15 MG tablet Take 15 mg by mouth 2 (two) times daily.    [provider]  cariprazine (VRAYLAR) 3 MG capsule Take 3 mg by mouth at bedtime.    [provider]  cyclobenzaprine  (FLEXERIL ) 10 MG tablet Take 10 mg by mouth 2 (two) times daily as needed for muscle spasms. Patient taking differently: Take 10 mg by mouth 2 (two) times daily. 02/09/21   [provider]  dapagliflozin  propanediol (FARXIGA ) 10 MG TABS tablet Take 1 tablet (10 mg total) by mouth daily. 11/18/23   Alvan Dorn FALCON, MD  diclofenac  Sodium (VOLTAREN ) 1 % GEL Apply 1 Application topically daily as needed for pain. Patient not taking: Reported on 12/19/2023 11/18/21   [provider]  dimenhyDRINATE (DRAMAMINE) 50 MG tablet Take 50 mg by mouth every 8 (eight) hours as  needed. Patient not taking: Reported on 12/19/2023    [provider]  diphenhydrAMINE  (BENADRYL ) 25 MG tablet Take 1 tablet (25 mg total) by mouth every 6 (six) hours as needed for itching. Patient not taking: Reported on 12/19/2023 12/21/18   Murray, Alyssa B, PA-C  ferrous sulfate 324 MG TBEC Take 324 mg by mouth daily with breakfast. Patient not taking: Reported on 12/19/2023    [provider]  ibuprofen  (ADVIL ) 600 MG tablet Take 1 tablet (600 mg total) by mouth every 6 (six) hours as needed. Patient taking differently: Take 200 mg by mouth every 6 (six) hours as needed for headache, mild pain (pain score 1-3) or moderate pain (pain score 4-6). 10/09/19   Kehrli, Kelsey F, PA-C  INGREZZA 80 MG CAPS Take 80 mg by mouth at bedtime. Patient not taking: Reported on 12/19/2023 03/23/20   [provider]  isosorbide  mononitrate (IMDUR ) 30 MG 24 hr tablet Take 1 tablet (30 mg total) by mouth daily. 11/18/23  Alvan Dorn FALCON, MD  Lurasidone  HCl 120 MG TABS Take 120 mg by mouth at bedtime. Patient not taking: Reported on 12/19/2023 01/11/20   [provider]  nitroGLYCERIN  (NITROSTAT ) 0.4 MG SL tablet PLACE 1 TAB UNDER TONGUE EVERY 3 MINUTES AS DIRECTED UP TO 3 TIMES AS NEEDED FOR CHEST PAIN. Patient not taking: Reported on 12/19/2023 11/18/23   Alvan Dorn FALCON, MD  oxyCODONE -acetaminophen  (PERCOCET) 10-325 MG tablet Take 1 tablet by mouth in the morning, at noon, in the evening, and at bedtime. 02/09/21   [provider]  oxyCODONE -acetaminophen  (PERCOCET/ROXICET) 5-325 MG tablet Take 1 tablet by mouth every 8 (eight) hours as needed for up to 5 doses for severe pain (pain score 7-10). Patient not taking: Reported on 12/19/2023 06/27/23   Hoy Nidia FALCON, PA-C  prazosin  (MINIPRESS ) 2 MG capsule Take 2 mg by mouth at bedtime. 11/29/21   [provider]  PROAIR  HFA 108 (90 Base) MCG/ACT inhaler INHALE 2 PUFFS INTO THE LUNGS EVERY 4 HOURS AS NEEDED FOR  WHEEZING ORSHORTNESS OF BREATH. Patient not taking: Reported on 12/19/2023 09/19/16   Zollie Lowers, MD  sacubitril-valsartan (ENTRESTO ) 24-26 MG Take 1 tablet by mouth 2 (two) times daily. Patient not taking: Reported on 12/19/2023 11/18/23   Alvan Dorn FALCON, MD  traZODone  (DESYREL ) 100 MG tablet Take 200 mg by mouth at bedtime. Patient not taking: Reported on 12/19/2023 04/02/20   [provider]    Physical Exam    Vital Signs:  Darlene Wilcox does not have vital signs available for review today.  Given telephonic nature of communication, physical exam is limited. AAOx3. NAD. Normal affect.  Speech and respirations are unlabored.  Accessory Clinical Findings    None  Assessment & Plan    1.  Preoperative Cardiovascular Risk Assessment: According to the Revised Cardiac Risk Index (RCRI), her Perioperative Risk of Major Cardiac Event is (%): 0.9. Her Functional Capacity in METs is: 5.35 according to the Duke Activity Status Index (DASI). The patient is doing well from a cardiac perspective. Therefore, based on ACC/AHA guidelines, the patient would be at acceptable risk for the planned procedure without further cardiovascular testing.   The patient was advised that if she develops new symptoms prior to surgery to contact our office to arrange for a follow-up visit, and she verbalized understanding.  Per office protocol, she may hold aspirin  for 7 days prior to procedure and should resume as soon as hemodynamically stable postoperatively.   A copy of this note will be routed to requesting surgeon.  Time:   Today, I have spent 10 minutes with the patient with telehealth technology discussing medical history, symptoms, and management plan.     Darlene EMERSON Bane, NP-C  01/17/2024, 3:15 PM [address] [place], [place] [address] Office [phone] Fax [phone]

## 2024-04-10 ENCOUNTER — Other Ambulatory Visit: Payer: Self-pay | Admitting: Cardiology

## 2024-04-13 ENCOUNTER — Other Ambulatory Visit: Payer: Self-pay | Admitting: Cardiology

## 2024-05-10 ENCOUNTER — Other Ambulatory Visit: Payer: Self-pay | Admitting: Cardiology

## 2024-06-24 ENCOUNTER — Encounter (HOSPITAL_COMMUNITY): Payer: Self-pay

## 2024-06-24 ENCOUNTER — Ambulatory Visit: Payer: Self-pay

## 2024-06-24 ENCOUNTER — Emergency Department (HOSPITAL_COMMUNITY)

## 2024-06-24 ENCOUNTER — Emergency Department (HOSPITAL_COMMUNITY)
Admission: EM | Admit: 2024-06-24 | Discharge: 2024-06-24 | Disposition: A | Discharge status: Home / Self Care | Attending: Emergency Medicine | Admitting: Emergency Medicine

## 2024-06-24 ENCOUNTER — Other Ambulatory Visit: Payer: Self-pay

## 2024-06-24 DIAGNOSIS — J45909 Unspecified asthma, uncomplicated: Secondary | ICD-10-CM | POA: Diagnosis not present

## 2024-06-24 DIAGNOSIS — R1012 Left upper quadrant pain: Secondary | ICD-10-CM

## 2024-06-24 DIAGNOSIS — I509 Heart failure, unspecified: Secondary | ICD-10-CM | POA: Diagnosis not present

## 2024-06-24 DIAGNOSIS — A084 Viral intestinal infection, unspecified: Secondary | ICD-10-CM | POA: Diagnosis not present

## 2024-06-24 DIAGNOSIS — I110 Hypertensive heart disease with heart failure: Secondary | ICD-10-CM | POA: Diagnosis not present

## 2024-06-24 DIAGNOSIS — Z7982 Long term (current) use of aspirin: Secondary | ICD-10-CM | POA: Diagnosis not present

## 2024-06-24 DIAGNOSIS — Z79899 Other long term (current) drug therapy: Secondary | ICD-10-CM | POA: Diagnosis not present

## 2024-06-24 LAB — URINALYSIS, ROUTINE W REFLEX MICROSCOPIC
Bilirubin Urine: NEGATIVE
Glucose, UA: >=500 mg/dL — AB
Hgb urine dipstick: NEGATIVE
Ketones, ur: NEGATIVE mg/dL
Nitrite: NEGATIVE
Protein, ur: 30 mg/dL — AB
Specific Gravity, Urine: 1.026 (ref 1.005–1.030)
WBC, UA: >50 WBC/hpf (ref 0–5)
pH: 5 (ref 5.0–8.0)

## 2024-06-24 LAB — COMPREHENSIVE METABOLIC PANEL WITH GFR
ALT: 33 U/L (ref 0–44)
AST: 32 U/L (ref 15–41)
Albumin: 4.4 g/dL (ref 3.5–5.0)
Alkaline Phosphatase: 150 U/L — ABNORMAL HIGH (ref 38–126)
Anion gap: 13 (ref 5–15)
BUN: 23 mg/dL (ref 8–23)
CO2: 22 mmol/L (ref 22–32)
Calcium: 9.3 mg/dL (ref 8.9–10.3)
Chloride: 105 mmol/L (ref 98–111)
Creatinine, Ser: 0.89 mg/dL (ref 0.44–1.00)
GFR, Estimated: >60 mL/min
Glucose, Bld: 102 mg/dL — ABNORMAL HIGH (ref 70–99)
Potassium: 4.5 mmol/L (ref 3.5–5.1)
Sodium: 141 mmol/L (ref 135–145)
Total Bilirubin: 0.4 mg/dL (ref 0.0–1.2)
Total Protein: 7.6 g/dL (ref 6.5–8.1)

## 2024-06-24 LAB — CBC
HCT: 45.1 % (ref 36.0–46.0)
Hemoglobin: 14.5 g/dL (ref 12.0–15.0)
MCH: 27.1 pg (ref 26.0–34.0)
MCHC: 32.2 g/dL (ref 30.0–36.0)
MCV: 84.3 fL (ref 80.0–100.0)
Platelets: 191 10*3/uL (ref 150–400)
RBC: 5.35 MIL/uL — ABNORMAL HIGH (ref 3.87–5.11)
RDW: 15.4 % (ref 11.5–15.5)
WBC: 10.1 10*3/uL (ref 4.0–10.5)
nRBC: 0 % (ref 0.0–0.2)

## 2024-06-24 LAB — LIPASE, BLOOD: Lipase: 43 U/L (ref 11–51)

## 2024-06-24 MED ORDER — ONDANSETRON HCL 4 MG PO TABS: 4.0000 mg | ORAL_TABLET | Freq: Three times a day (TID) | ORAL | 0 refills | Status: AC | PRN

## 2024-06-24 MED ORDER — DIPHENOXYLATE-ATROPINE 2.5-0.025 MG PO TABS: 1.0000 | ORAL_TABLET | Freq: Four times a day (QID) | ORAL | 0 refills | Status: AC | PRN

## 2024-06-24 MED ORDER — METOCLOPRAMIDE HCL 5 MG/ML IJ SOLN
10.0000 mg | Freq: Once | INTRAMUSCULAR | Status: AC
  Administered 2024-06-24: 10 mg via INTRAVENOUS
  Filled 2024-06-24: qty 2

## 2024-06-24 MED ORDER — MORPHINE SULFATE (PF) 4 MG/ML IV SOLN
4.0000 mg | Freq: Once | INTRAVENOUS | Status: AC
  Administered 2024-06-24: 4 mg via INTRAVENOUS
  Filled 2024-06-24: qty 1

## 2024-06-24 MED ORDER — DIPHENOXYLATE-ATROPINE 2.5-0.025 MG PO TABS: 2.0000 | ORAL_TABLET | Freq: Once | ORAL | Status: DC

## 2024-06-24 MED ORDER — IOHEXOL 300 MG/ML  SOLN
100.0000 mL | Freq: Once | INTRAMUSCULAR | Status: AC | PRN
  Administered 2024-06-24: 100 mL via INTRAVENOUS

## 2024-06-24 NOTE — Discharge Instructions (Signed)
 Take medications  as prescribed, remember that the Lomotil  is a very strong antidiarrheal medicine, only continue taking this if you continue to have diarrhea, sometimes only 1 or 2 doses as needed to resolve this symptom.  If you continue to take it and you do not need it it will make you constipated.  Have also prescribed you some nausea medicine as well.  Make sure you are staying well-hydrated.  I also recommend a bland diet for at least the next several days after which you can bump up your diet if your symptoms are improved.  The BRAT diet consists of bananas, rice, applesauce and dry toast, these are medicines that tend to do well with your symptoms and also help to relieve diarrhea.

## 2024-06-24 NOTE — ED Triage Notes (Signed)
 Pt reports LUQ pain with diarrhea and vomiting x 2 weeks.

## 2024-06-24 NOTE — Telephone Encounter (Signed)
 FYI Only or Action Required?: FYI only for provider: ED advised.  Patient was last seen in primary care on new pt .  Called Nurse Triage reporting Abdominal Pain.  Symptoms began several weeks ago.  Interventions attempted: Rest, hydration, or home remedies.  Symptoms are: gradually worsening.  Triage Disposition: See HCP Within 4 Hours (Or PCP Triage)  Patient/caregiver understands and will follow disposition?: Unsure  Message from Lonell PEDLAR sent at 06/24/2024 11:50 AM EST  Reason for Triage: abdominal pain Looking to est care with Leita Longs at [facility]   Reason for Disposition  [1] MILD-MODERATE pain AND [2] constant AND [3] age > 60 years  Answer Assessment - Initial Assessment Questions Calling to set up new pt appt with [facility].  Abdominal pain in the LUQ for the last few weeks. Denies radiation. Consistent 4/10 pain, nausea vomiting, diarrhea. Having worsened SOB with activity and wheezing. Inhaler with minimal improvement. Denies CP, Fever, dizziness, bloody urine/stools Chronic back pain that causes numbness bilateral lowers. Denies unilateral weakness, paresthesias.   New pt appt June next available and placed on waitlist. Advised needs to be seen in the ED to evaluate for acute process, electrolyte abnormality   1. LOCATION: Where does it hurt?      Left side in front  2. RADIATION: Does the pain shoot anywhere else? (e.g., chest, back)     Unsure if back pain causing leg pain and numbness bilaterally  3. ONSET: When did the pain begin? (e.g., minutes, hours or days ago)      A couple weeks  4. SUDDEN: Gradual or sudden onset?     Gradually worsening  5. PATTERN Does the pain come and go, or is it constant?     Constant  6. SEVERITY: How bad is the pain?  (e.g., Scale 1-10; mild, moderate, or severe)     4/10 7. RECURRENT SYMPTOM: Have you ever had this type of stomach pain before? If Yes, ask: When was the last time? and What  happened that time?      Denies  8. CAUSE: What do you think is causing the stomach pain? (e.g., gallstones, recent abdominal surgery)     Unsure  9. RELIEVING/AGGRAVATING FACTORS: What makes it better or worse? (e.g., antacids, bending or twisting motion, bowel movement)     No changes with BM 10. OTHER SYMPTOMS: Do you have any other symptoms? (e.g., back pain, diarrhea, fever, urination pain, vomiting)       Back pain (chronic- back surgeries), diarrhea, nausea and vomiting.  Protocols used: Abdominal Pain - Female-A-AH

## 2024-06-24 NOTE — ED Provider Notes (Signed)
 " [place] EMERGENCY DEPARTMENT AT [facility] Provider Note   CSN: [id number] Arrival date & time: 06/24/24  1429     Patient presents with: Abdominal Pain   Darlene Wilcox is a 64 y.o. female with a history including CHF, GERD, hypertension, chronic back pain, asthma and bipolar disorder, surgical history significant for abdominal hysterectomy, inguinal hernia repair, appendectomy presenting for evaluation of a 2-week history of constant left upper abdominal pain along with nausea vomiting and diarrhea.  She reports watery nonbloody diarrhea multiple times per day, usually triggered by attempts at p.o. intake.  She has been able to eat small bites of food and sip liquids but any significant food intake results in vomiting.  She has had no fevers or chills, she denies chest pain, shortness of breath, dysuria.  She has had no treatment for symptoms prior to arrival.   The history is provided by the patient.       Prior to Admission medications  Medication Sig Start Date End Date Taking? Authorizing Provider  diphenoxylate -atropine  (LOMOTIL ) 2.5-0.025 MG tablet Take 1 tablet by mouth 4 (four) times daily as needed for diarrhea or loose stools. 06/24/24  Yes Bradie Sangiovanni, PA-C  ondansetron  (ZOFRAN ) 4 MG tablet Take 1 tablet (4 mg total) by mouth every 8 (eight) hours as needed for nausea or vomiting. 06/24/24  Yes Dawn Kiper, Mliss, PA-C  ASPIRIN  EC ADULT LOW DOSE 81 MG tablet TAKE 1 TABLET BY MOUTH EVERY DAY Swallow whole. 04/13/24   Alvan Dorn FALCON, MD  atorvastatin  (LIPITOR) 20 MG tablet Take 1 tablet (20 mg total) by mouth daily. Patient not taking: Reported on 12/19/2023 11/18/23   Alvan Dorn FALCON, MD  benztropine (COGENTIN) 1 MG tablet Take 1 mg by mouth at bedtime. Patient not taking: Reported on 12/19/2023 02/09/21   [provider]  bisoprolol  (ZEBETA ) 5 MG tablet Take 5 mg by mouth daily. 12/16/23   [provider]  busPIRone  (BUSPAR ) 15 MG tablet Take 15 mg  by mouth 2 (two) times daily.    [provider]  cariprazine (VRAYLAR) 3 MG capsule Take 3 mg by mouth at bedtime.    [provider]  cyclobenzaprine  (FLEXERIL ) 10 MG tablet Take 10 mg by mouth 2 (two) times daily as needed for muscle spasms. Patient taking differently: Take 10 mg by mouth 2 (two) times daily. 02/09/21   [provider]  dapagliflozin  propanediol (FARXIGA ) 10 MG TABS tablet Take 1 tablet (10 mg total) by mouth daily. 11/18/23   Alvan Dorn FALCON, MD  diclofenac  Sodium (VOLTAREN ) 1 % GEL Apply 1 Application topically daily as needed for pain. Patient not taking: Reported on 12/19/2023 11/18/21   [provider]  dimenhyDRINATE (DRAMAMINE) 50 MG tablet Take 50 mg by mouth every 8 (eight) hours as needed. Patient not taking: Reported on 12/19/2023    [provider]  diphenhydrAMINE  (BENADRYL ) 25 MG tablet Take 1 tablet (25 mg total) by mouth every 6 (six) hours as needed for itching. Patient not taking: Reported on 12/19/2023 12/21/18   Murray, Alyssa B, PA-C  ferrous sulfate 324 MG TBEC Take 324 mg by mouth daily with breakfast. Patient not taking: Reported on 12/19/2023    [provider]  ibuprofen  (ADVIL ) 600 MG tablet Take 1 tablet (600 mg total) by mouth every 6 (six) hours as needed. Patient taking differently: Take 200 mg by mouth every 6 (six) hours as needed for headache, mild pain (pain score 1-3) or moderate pain (pain score  4-6). 10/09/19   Alva Larraine FALCON, PA-C  INGREZZA 80 MG CAPS Take 80 mg by mouth at bedtime. Patient not taking: Reported on 12/19/2023 03/23/20   [provider]  isosorbide  mononitrate (IMDUR ) 30 MG 24 hr tablet Take 1 tablet (30 mg total) by mouth daily. 11/18/23   Alvan Dorn FALCON, MD  Lurasidone  HCl 120 MG TABS Take 120 mg by mouth at bedtime. Patient not taking: Reported on 12/19/2023 01/11/20   [provider]  nitroGLYCERIN  (NITROSTAT ) 0.4 MG SL tablet PLACE 1 TAB UNDER TONGUE  EVERY 3 MINUTES AS DIRECTED UP TO 3 TIMES AS NEEDED FOR CHEST PAIN. Patient not taking: Reported on 12/19/2023 11/18/23   Alvan Dorn FALCON, MD  oxyCODONE -acetaminophen  (PERCOCET) 10-325 MG tablet Take 1 tablet by mouth in the morning, at noon, in the evening, and at bedtime. 02/09/21   [provider]  oxyCODONE -acetaminophen  (PERCOCET/ROXICET) 5-325 MG tablet Take 1 tablet by mouth every 8 (eight) hours as needed for up to 5 doses for severe pain (pain score 7-10). Patient not taking: Reported on 12/19/2023 06/27/23   Hoy Nidia FALCON, PA-C  prazosin  (MINIPRESS ) 2 MG capsule Take 2 mg by mouth at bedtime. 11/29/21   [provider]  PROAIR  HFA 108 (90 Base) MCG/ACT inhaler INHALE 2 PUFFS INTO THE LUNGS EVERY 4 HOURS AS NEEDED FOR WHEEZING ORSHORTNESS OF BREATH. Patient not taking: Reported on 12/19/2023 09/19/16   Zollie Lowers, MD  sacubitril-valsartan (ENTRESTO ) 24-26 MG TAKE 1 TABLET BY MOUTH TWICE A DAY 05/11/24   Alvan Dorn FALCON, MD  traZODone  (DESYREL ) 100 MG tablet Take 200 mg by mouth at bedtime. Patient not taking: Reported on 12/19/2023 04/02/20   [provider]    Allergies: Apple juice, Penicillins, and Bee venom    Review of Systems  Constitutional:  Negative for chills and fever.  HENT:  Negative for congestion and sore throat.   Eyes: Negative.   Respiratory:  Negative for chest tightness and shortness of breath.   Cardiovascular:  Negative for chest pain.  Gastrointestinal:  Positive for abdominal pain, diarrhea, nausea and vomiting.  Genitourinary: Negative.   Musculoskeletal:  Negative for arthralgias, joint swelling and neck pain.  Skin: Negative.  Negative for rash and wound.  Neurological:  Negative for dizziness, weakness, light-headedness, numbness and headaches.  Psychiatric/Behavioral: Negative.      Updated Vital Signs BP (!) 141/70   Pulse 75   Temp 98.3 F (36.8 C) (Oral)   Resp 18   Wt 74.8 kg   SpO2 93%   BMI 30.18 kg/m    Physical Exam Vitals and nursing note reviewed.  Constitutional:      Appearance: She is well-developed.  HENT:     Head: Normocephalic and atraumatic.  Eyes:     Conjunctiva/sclera: Conjunctivae normal.  Cardiovascular:     Rate and Rhythm: Normal rate and regular rhythm.     Heart sounds: Normal heart sounds.  Pulmonary:     Effort: Pulmonary effort is normal.     Breath sounds: Normal breath sounds. No wheezing.  Abdominal:     General: Abdomen is protuberant. Bowel sounds are normal.     Palpations: Abdomen is soft.     Tenderness: There is abdominal tenderness in the left upper quadrant. There is no guarding.  Musculoskeletal:        General: Normal range of motion.     Cervical back: Normal range of motion.  Skin:    General: Skin is warm and dry.  Neurological:  Mental Status: She is alert and oriented to person, place, and time.     (all labs ordered are listed, but only abnormal results are displayed) Labs Reviewed  COMPREHENSIVE METABOLIC PANEL WITH GFR - Abnormal; Notable for the following components:      Result Value   Glucose, Bld 102 (*)    Alkaline Phosphatase 150 (*)    All other components within normal limits  CBC - Abnormal; Notable for the following components:   RBC 5.35 (*)    All other components within normal limits  URINALYSIS, ROUTINE W REFLEX MICROSCOPIC - Abnormal; Notable for the following components:   APPearance HAZY (*)    Glucose, UA >=500 (*)    Protein, ur 30 (*)    Leukocytes,Ua SMALL (*)    Bacteria, UA RARE (*)    All other components within normal limits  LIPASE, BLOOD    EKG: None  Radiology: CT ABDOMEN PELVIS W CONTRAST Result Date: 06/24/2024 EXAM: CT ABDOMEN AND PELVIS WITH CONTRAST 06/24/2024 06:54:29 PM TECHNIQUE: CT of the abdomen and pelvis was performed with the administration of intravenous contrast. Multiplanar reformatted images are provided for review. Automated exposure control, iterative reconstruction,  and/or weight-based adjustment of the mA/kV was utilized to reduce the radiation dose to as low as reasonably achievable. COMPARISON: 02/25/2020 CLINICAL HISTORY: LUQ abdominal pain. Left upper quadrant abdominal pain. FINDINGS: LOWER CHEST: No acute abnormality. LIVER: The liver is unremarkable. GALLBLADDER AND BILE DUCTS: Gallbladder is unremarkable. No biliary ductal dilatation. SPLEEN: No acute abnormality. PANCREAS: No acute abnormality. ADRENAL GLANDS: No acute abnormality. KIDNEYS, URETERS AND BLADDER: No stones in the kidneys or ureters. No hydronephrosis. No perinephric or periureteral stranding. Partially distended urinary bladder, without focal abnormality. GI AND BOWEL: Stomach demonstrates no acute abnormality. There is no bowel obstruction. Appendectomy. PERITONEUM AND RETROPERITONEUM: No ascites. No free air. No free pelvic fluid. VASCULATURE: Aorta is normal in caliber. Diffuse aortoiliac atherosclerosis. LYMPH NODES: No lymphadenopathy. REPRODUCTIVE ORGANS: Hysterectomy. BONES AND SOFT TISSUES: Periprosthetic lucency surrounding the left T10 pedicle screw, suggestive of loosening. Screws also present transfixing the sacroiliac joints. Diffuse osteopenia. Multilevel degenerative disc disease of the spine. No acute osseous abnormality. No focal soft tissue abnormality. LIMITATIONS/ARTIFACTS: The study is degraded by metallic streak artifact from the extensive thoracosacral fusion hardware. IMPRESSION: 1. No acute findings in the abdomen or pelvis. Electronically signed by: Rogelia Myers MD 06/24/2024 07:26 PM EST RP Workstation: [id number]     Procedures   Medications Ordered in the ED  diphenoxylate -atropine  (LOMOTIL ) 2.5-0.025 MG per tablet 2 tablet (has no administration in time range)  morphine  (PF) 4 MG/ML injection 4 mg (4 mg Intravenous Given 06/24/24 1914)  metoCLOPramide  (REGLAN ) injection 10 mg (10 mg Intravenous Given 06/24/24 1914)  iohexol  (OMNIPAQUE ) 300 MG/ML solution 100 mL  (100 mLs Intravenous Contrast Given 06/24/24 1844)                                    Medical Decision Making Presenting with a 2-week history of left upper quadrant abdominal pain along with nausea vomiting and diarrhea, she has no flank pain, no urinary complaints.  Differential diagnoses include colitis, diverticulitis, pancreatitis, small bowel obstruction, GERD, PUD.  Labs obtained are reassuring with no obvious outliers.  CT imaging is negative for acute findings.  Suspect this is a viral gastroenteritis, patient has not had any recent antibiotics, C. difficile unlikely, especially she has been afebrile has a  normal WBC count.  Symptoms could also be functional.  She was given Lomotil  and a prescription for Zofran  to help her with her symptoms, advised increase fluid intake, bland diet slowly increasing to regular diet as symptoms allow.  She was advised close follow-up with her new PCP if symptoms are not improving with this treatment plan.  Amount and/or Complexity of Data Reviewed Labs: ordered.    Details: Labs reviewed, she has a normal lipase at 43, pancreatitis unlikely.  Her CMET is also normal.  CBC is also reassuring with a normal WBC count at 10.1. Radiology: ordered.    Details: CT negative for acute intra-abdominal findings.  Risk Prescription drug management.        Final diagnoses:  Left upper quadrant abdominal pain  Viral gastroenteritis    ED Discharge Orders          Ordered    diphenoxylate -atropine  (LOMOTIL ) 2.5-0.025 MG tablet  4 times daily PRN        06/24/24 2000    ondansetron  (ZOFRAN ) 4 MG tablet  Every 8 hours PRN        06/24/24 2000               Caledonia Zou, PA-C 06/24/24 2004  "

## 2024-11-18 ENCOUNTER — Ambulatory Visit: Payer: Self-pay
